# Patient Record
Sex: Female | Born: 1950 | Race: White | Hispanic: No | Marital: Married | Smoking: Never smoker
Health system: Southern US, Community
[De-identification: ages and names within clinical notes are randomized; demographics above are authoritative.]

## PROBLEM LIST (undated history)

## (undated) DIAGNOSIS — Z9481 Bone marrow transplant status: Secondary | ICD-10-CM

## (undated) DIAGNOSIS — I639 Cerebral infarction, unspecified: Secondary | ICD-10-CM

## (undated) DIAGNOSIS — S065X9A Traumatic subdural hemorrhage with loss of consciousness of unspecified duration, initial encounter: Secondary | ICD-10-CM

## (undated) DIAGNOSIS — F05 Delirium due to known physiological condition: Secondary | ICD-10-CM

## (undated) DIAGNOSIS — E669 Obesity, unspecified: Secondary | ICD-10-CM

## (undated) DIAGNOSIS — D496 Neoplasm of unspecified behavior of brain: Secondary | ICD-10-CM

## (undated) DIAGNOSIS — E039 Hypothyroidism, unspecified: Secondary | ICD-10-CM

## (undated) DIAGNOSIS — C50919 Malignant neoplasm of unspecified site of unspecified female breast: Secondary | ICD-10-CM

## (undated) DIAGNOSIS — S065XAA Traumatic subdural hemorrhage with loss of consciousness status unknown, initial encounter: Secondary | ICD-10-CM

## (undated) DIAGNOSIS — C801 Malignant (primary) neoplasm, unspecified: Secondary | ICD-10-CM

## (undated) DIAGNOSIS — R569 Unspecified convulsions: Secondary | ICD-10-CM

## (undated) DIAGNOSIS — F419 Anxiety disorder, unspecified: Secondary | ICD-10-CM

## (undated) DIAGNOSIS — F329 Major depressive disorder, single episode, unspecified: Secondary | ICD-10-CM

## (undated) DIAGNOSIS — I951 Orthostatic hypotension: Secondary | ICD-10-CM

## (undated) DIAGNOSIS — G903 Multi-system degeneration of the autonomic nervous system: Secondary | ICD-10-CM

## (undated) DIAGNOSIS — D649 Anemia, unspecified: Secondary | ICD-10-CM

## (undated) DIAGNOSIS — R32 Unspecified urinary incontinence: Secondary | ICD-10-CM

## (undated) DIAGNOSIS — C719 Malignant neoplasm of brain, unspecified: Secondary | ICD-10-CM

## (undated) DIAGNOSIS — R413 Other amnesia: Secondary | ICD-10-CM

## (undated) DIAGNOSIS — G540 Brachial plexus disorders: Secondary | ICD-10-CM

## (undated) DIAGNOSIS — F0390 Unspecified dementia without behavioral disturbance: Secondary | ICD-10-CM

## (undated) DIAGNOSIS — Z9884 Bariatric surgery status: Secondary | ICD-10-CM

## (undated) DIAGNOSIS — K589 Irritable bowel syndrome without diarrhea: Secondary | ICD-10-CM

## (undated) DIAGNOSIS — Z5189 Encounter for other specified aftercare: Secondary | ICD-10-CM

## (undated) DIAGNOSIS — I679 Cerebrovascular disease, unspecified: Secondary | ICD-10-CM

## (undated) HISTORY — DX: Orthostatic hypotension: I95.1

## (undated) HISTORY — PX: BREAST LUMPECTOMY: SHX2

## (undated) HISTORY — PX: CHOLECYSTECTOMY: SHX55

## (undated) HISTORY — DX: Malignant neoplasm of unspecified site of unspecified female breast: C50.919

## (undated) HISTORY — DX: Unspecified convulsions: R56.9

## (undated) HISTORY — DX: Other amnesia: R41.3

## (undated) HISTORY — DX: Major depressive disorder, single episode, unspecified: F32.9

## (undated) HISTORY — DX: Hypothyroidism, unspecified: E03.9

## (undated) HISTORY — DX: Bone marrow transplant status: Z94.81

## (undated) HISTORY — DX: Brachial plexus disorders: G54.0

## (undated) HISTORY — DX: Traumatic subdural hemorrhage with loss of consciousness of unspecified duration, initial encounter: S06.5X9A

## (undated) HISTORY — PX: CRANIOTOMY FOR HEMISPHERECTOMY TOTAL / PARTIAL: SUR335

## (undated) HISTORY — DX: Irritable bowel syndrome, unspecified: K58.9

## (undated) HISTORY — DX: Obesity, unspecified: E66.9

## (undated) HISTORY — DX: Anxiety disorder, unspecified: F41.9

## (undated) HISTORY — DX: Malignant neoplasm of brain, unspecified: C71.9

## (undated) HISTORY — PX: HAND TENDON SURGERY: SHX663

## (undated) HISTORY — DX: Traumatic subdural hemorrhage with loss of consciousness status unknown, initial encounter: S06.5XAA

## (undated) HISTORY — DX: Unspecified urinary incontinence: R32

## (undated) HISTORY — PX: LYMPH NODE BIOPSY: SHX201

---

## 1983-09-22 HISTORY — PX: GASTRIC BYPASS: SHX52

## 1989-09-21 DIAGNOSIS — C50919 Malignant neoplasm of unspecified site of unspecified female breast: Secondary | ICD-10-CM

## 1989-09-21 HISTORY — PX: BREAST LUMPECTOMY: SHX2

## 1989-09-21 HISTORY — PX: BONE MARROW TRANSPLANT: SHX200

## 1989-09-21 HISTORY — DX: Malignant neoplasm of unspecified site of unspecified female breast: C50.919

## 2003-09-22 DIAGNOSIS — I639 Cerebral infarction, unspecified: Secondary | ICD-10-CM

## 2003-09-22 HISTORY — DX: Cerebral infarction, unspecified: I63.9

## 2008-09-21 HISTORY — PX: CRANIOTOMY FOR HEMISPHERECTOMY TOTAL / PARTIAL: SUR335

## 2013-09-21 HISTORY — PX: HAND SURGERY: SHX662

## 2014-09-21 DIAGNOSIS — G459 Transient cerebral ischemic attack, unspecified: Secondary | ICD-10-CM

## 2014-09-21 HISTORY — DX: Transient cerebral ischemic attack, unspecified: G45.9

## 2015-08-31 ENCOUNTER — Encounter (HOSPITAL_COMMUNITY): Payer: Self-pay | Admitting: Emergency Medicine

## 2015-08-31 ENCOUNTER — Emergency Department (HOSPITAL_COMMUNITY): Payer: Medicare Other

## 2015-08-31 ENCOUNTER — Inpatient Hospital Stay (HOSPITAL_COMMUNITY): Payer: Medicare Other

## 2015-08-31 ENCOUNTER — Observation Stay (HOSPITAL_COMMUNITY)
Admission: EM | Admit: 2015-08-31 | Discharge: 2015-09-02 | Disposition: A | Discharge status: Home / Self Care | Payer: Medicare Other | Attending: Internal Medicine | Admitting: Internal Medicine

## 2015-08-31 DIAGNOSIS — R52 Pain, unspecified: Secondary | ICD-10-CM

## 2015-08-31 DIAGNOSIS — R569 Unspecified convulsions: Secondary | ICD-10-CM | POA: Diagnosis not present

## 2015-08-31 DIAGNOSIS — N3281 Overactive bladder: Secondary | ICD-10-CM | POA: Diagnosis present

## 2015-08-31 DIAGNOSIS — Z7902 Long term (current) use of antithrombotics/antiplatelets: Secondary | ICD-10-CM | POA: Insufficient documentation

## 2015-08-31 DIAGNOSIS — G458 Other transient cerebral ischemic attacks and related syndromes: Secondary | ICD-10-CM

## 2015-08-31 DIAGNOSIS — Z9989 Dependence on other enabling machines and devices: Secondary | ICD-10-CM

## 2015-08-31 DIAGNOSIS — M87051 Idiopathic aseptic necrosis of right femur: Secondary | ICD-10-CM | POA: Diagnosis not present

## 2015-08-31 DIAGNOSIS — M25452 Effusion, left hip: Secondary | ICD-10-CM | POA: Insufficient documentation

## 2015-08-31 DIAGNOSIS — I951 Orthostatic hypotension: Secondary | ICD-10-CM | POA: Diagnosis not present

## 2015-08-31 DIAGNOSIS — F329 Major depressive disorder, single episode, unspecified: Secondary | ICD-10-CM | POA: Insufficient documentation

## 2015-08-31 DIAGNOSIS — G4733 Obstructive sleep apnea (adult) (pediatric): Secondary | ICD-10-CM | POA: Diagnosis not present

## 2015-08-31 DIAGNOSIS — G629 Polyneuropathy, unspecified: Secondary | ICD-10-CM

## 2015-08-31 DIAGNOSIS — M160 Bilateral primary osteoarthritis of hip: Secondary | ICD-10-CM | POA: Insufficient documentation

## 2015-08-31 DIAGNOSIS — M25451 Effusion, right hip: Secondary | ICD-10-CM | POA: Insufficient documentation

## 2015-08-31 DIAGNOSIS — G459 Transient cerebral ischemic attack, unspecified: Principal | ICD-10-CM | POA: Diagnosis present

## 2015-08-31 DIAGNOSIS — D649 Anemia, unspecified: Secondary | ICD-10-CM | POA: Diagnosis not present

## 2015-08-31 DIAGNOSIS — I6930 Unspecified sequelae of cerebral infarction: Secondary | ICD-10-CM

## 2015-08-31 DIAGNOSIS — R0989 Other specified symptoms and signs involving the circulatory and respiratory systems: Secondary | ICD-10-CM | POA: Diagnosis not present

## 2015-08-31 DIAGNOSIS — E039 Hypothyroidism, unspecified: Secondary | ICD-10-CM | POA: Diagnosis present

## 2015-08-31 DIAGNOSIS — E785 Hyperlipidemia, unspecified: Secondary | ICD-10-CM | POA: Diagnosis not present

## 2015-08-31 DIAGNOSIS — Z853 Personal history of malignant neoplasm of breast: Secondary | ICD-10-CM | POA: Diagnosis not present

## 2015-08-31 DIAGNOSIS — I739 Peripheral vascular disease, unspecified: Secondary | ICD-10-CM | POA: Insufficient documentation

## 2015-08-31 DIAGNOSIS — C7931 Secondary malignant neoplasm of brain: Secondary | ICD-10-CM | POA: Diagnosis not present

## 2015-08-31 DIAGNOSIS — M87851 Other osteonecrosis, right femur: Secondary | ICD-10-CM | POA: Insufficient documentation

## 2015-08-31 DIAGNOSIS — I253 Aneurysm of heart: Secondary | ICD-10-CM | POA: Diagnosis not present

## 2015-08-31 DIAGNOSIS — I4581 Long QT syndrome: Secondary | ICD-10-CM | POA: Insufficient documentation

## 2015-08-31 DIAGNOSIS — Z79899 Other long term (current) drug therapy: Secondary | ICD-10-CM | POA: Diagnosis not present

## 2015-08-31 DIAGNOSIS — I639 Cerebral infarction, unspecified: Secondary | ICD-10-CM

## 2015-08-31 DIAGNOSIS — F0390 Unspecified dementia without behavioral disturbance: Secondary | ICD-10-CM | POA: Diagnosis present

## 2015-08-31 DIAGNOSIS — R9431 Abnormal electrocardiogram [ECG] [EKG]: Secondary | ICD-10-CM | POA: Diagnosis present

## 2015-08-31 HISTORY — DX: Neoplasm of unspecified behavior of brain: D49.6

## 2015-08-31 HISTORY — DX: Malignant (primary) neoplasm, unspecified: C80.1

## 2015-08-31 HISTORY — DX: Unspecified convulsions: R56.9

## 2015-08-31 HISTORY — DX: Encounter for other specified aftercare: Z51.89

## 2015-08-31 HISTORY — DX: Anemia, unspecified: D64.9

## 2015-08-31 HISTORY — DX: Cerebral infarction, unspecified: I63.9

## 2015-08-31 LAB — DIFFERENTIAL
BASOS ABS: 0.1 10*3/uL (ref 0.0–0.1)
Basophils Relative: 1 %
EOS ABS: 0.2 10*3/uL (ref 0.0–0.7)
Eosinophils Relative: 3 %
LYMPHS ABS: 1.6 10*3/uL (ref 0.7–4.0)
LYMPHS PCT: 28 %
MONOS PCT: 9 %
Monocytes Absolute: 0.5 10*3/uL (ref 0.1–1.0)
NEUTROS PCT: 59 %
Neutro Abs: 3.3 10*3/uL (ref 1.7–7.7)

## 2015-08-31 LAB — COMPREHENSIVE METABOLIC PANEL
ALBUMIN: 3.5 g/dL (ref 3.5–5.0)
ALK PHOS: 92 U/L (ref 38–126)
ALT: 17 U/L (ref 14–54)
AST: 25 U/L (ref 15–41)
Anion gap: 9 (ref 5–15)
BILIRUBIN TOTAL: 0.7 mg/dL (ref 0.3–1.2)
BUN: 17 mg/dL (ref 6–20)
CALCIUM: 9 mg/dL (ref 8.9–10.3)
CO2: 21 mmol/L — ABNORMAL LOW (ref 22–32)
CREATININE: 1.06 mg/dL — AB (ref 0.44–1.00)
Chloride: 109 mmol/L (ref 101–111)
GFR calc Af Amer: >60 mL/min (ref >60)
GFR, EST NON AFRICAN AMERICAN: 54 mL/min — AB (ref >60)
Glucose, Bld: 111 mg/dL — ABNORMAL HIGH (ref 65–99)
Potassium: 3.5 mmol/L (ref 3.5–5.1)
Sodium: 139 mmol/L (ref 135–145)
TOTAL PROTEIN: 6.5 g/dL (ref 6.5–8.1)

## 2015-08-31 LAB — I-STAT CHEM 8, ED
BUN: 20 mg/dL (ref 6–20)
CREATININE: 1 mg/dL (ref 0.44–1.00)
Calcium, Ion: 1.18 mmol/L (ref 1.13–1.30)
Chloride: 105 mmol/L (ref 101–111)
GLUCOSE: 105 mg/dL — AB (ref 65–99)
HCT: 41 % (ref 36.0–46.0)
HEMOGLOBIN: 13.9 g/dL (ref 12.0–15.0)
POTASSIUM: 3.5 mmol/L (ref 3.5–5.1)
Sodium: 142 mmol/L (ref 135–145)
TCO2: 24 mmol/L (ref 0–100)

## 2015-08-31 LAB — APTT: APTT: 26 s (ref 24–37)

## 2015-08-31 LAB — TSH: TSH: 1.178 u[IU]/mL (ref 0.350–4.500)

## 2015-08-31 LAB — CBC
HEMATOCRIT: 38.3 % (ref 36.0–46.0)
HEMOGLOBIN: 12.4 g/dL (ref 12.0–15.0)
MCH: 29.3 pg (ref 26.0–34.0)
MCHC: 32.4 g/dL (ref 30.0–36.0)
MCV: 90.5 fL (ref 78.0–100.0)
Platelets: 214 10*3/uL (ref 150–400)
RBC: 4.23 MIL/uL (ref 3.87–5.11)
RDW: 14.3 % (ref 11.5–15.5)
WBC: 5.6 10*3/uL (ref 4.0–10.5)

## 2015-08-31 LAB — PROTIME-INR
INR: 1.13 (ref 0.00–1.49)
Prothrombin Time: 14.7 seconds (ref 11.6–15.2)

## 2015-08-31 LAB — RAPID URINE DRUG SCREEN, HOSP PERFORMED
Amphetamines: NOT DETECTED
BARBITURATES: NOT DETECTED
Benzodiazepines: NOT DETECTED
COCAINE: NOT DETECTED
Opiates: NOT DETECTED
TETRAHYDROCANNABINOL: NOT DETECTED

## 2015-08-31 LAB — GLUCOSE, CAPILLARY: GLUCOSE-CAPILLARY: 75 mg/dL (ref 65–99)

## 2015-08-31 LAB — ETHANOL: Alcohol, Ethyl (B): <5 mg/dL (ref <5)

## 2015-08-31 LAB — CBG MONITORING, ED: GLUCOSE-CAPILLARY: 96 mg/dL (ref 65–99)

## 2015-08-31 LAB — I-STAT TROPONIN, ED: TROPONIN I, POC: 0 ng/mL (ref 0.00–0.08)

## 2015-08-31 MED ORDER — IOHEXOL 350 MG/ML SOLN
50.0000 mL | Freq: Once | INTRAVENOUS | Status: AC | PRN
  Administered 2015-08-31: 50 mL via INTRAVENOUS

## 2015-08-31 MED ORDER — VITAMIN B-12 1000 MCG PO TABS
1000.0000 ug | ORAL_TABLET | Freq: Every day | ORAL | Status: DC
  Administered 2015-09-01 – 2015-09-02 (×2): 1000 ug via ORAL
  Filled 2015-08-31 (×2): qty 1

## 2015-08-31 MED ORDER — MIDODRINE HCL 5 MG PO TABS: 10.0000 mg | ORAL_TABLET | Freq: Two times a day (BID) | ORAL | Status: DC

## 2015-08-31 MED ORDER — MIRABEGRON ER 25 MG PO TB24
50.0000 mg | ORAL_TABLET | Freq: Every day | ORAL | Status: DC
  Administered 2015-09-01 – 2015-09-02 (×2): 50 mg via ORAL
  Filled 2015-08-31 (×2): qty 2

## 2015-08-31 MED ORDER — STROKE: EARLY STAGES OF RECOVERY BOOK
Freq: Once | Status: AC
  Administered 2015-08-31: 23:00:00
  Filled 2015-08-31: qty 1

## 2015-08-31 MED ORDER — SODIUM CHLORIDE 0.9 % IV SOLN
INTRAVENOUS | Status: DC
Start: 2015-08-31 — End: 2015-08-31

## 2015-08-31 MED ORDER — KETOROLAC TROMETHAMINE 15 MG/ML IJ SOLN: 15.0000 mg | Freq: Four times a day (QID) | INTRAMUSCULAR | Status: DC | PRN

## 2015-08-31 MED ORDER — ESCITALOPRAM OXALATE 10 MG PO TABS
20.0000 mg | ORAL_TABLET | Freq: Every day | ORAL | Status: DC
  Administered 2015-09-01 – 2015-09-02 (×2): 20 mg via ORAL
  Filled 2015-08-31 (×2): qty 2

## 2015-08-31 MED ORDER — DEXTROSE-NACL 5-0.9 % IV SOLN
INTRAVENOUS | Status: DC
  Administered 2015-08-31 – 2015-09-01 (×2): via INTRAVENOUS

## 2015-08-31 MED ORDER — PANTOPRAZOLE SODIUM 40 MG PO TBEC: 40.0000 mg | DELAYED_RELEASE_TABLET | Freq: Every day | ORAL | Status: DC

## 2015-08-31 MED ORDER — LIDOCAINE 5 % EX PTCH
1.0000 | MEDICATED_PATCH | CUTANEOUS | Status: DC
  Administered 2015-08-31 – 2015-09-02 (×3): 1 via TRANSDERMAL
  Filled 2015-08-31 (×3): qty 1

## 2015-08-31 MED ORDER — ADULT MULTIVITAMIN W/MINERALS CH
1.0000 | ORAL_TABLET | Freq: Every day | ORAL | Status: DC
  Administered 2015-09-01 – 2015-09-02 (×2): 1 via ORAL
  Filled 2015-08-31 (×3): qty 1

## 2015-08-31 MED ORDER — DONEPEZIL HCL 10 MG PO TABS
10.0000 mg | ORAL_TABLET | Freq: Every day | ORAL | Status: DC
  Administered 2015-09-01: 10 mg via ORAL
  Filled 2015-08-31: qty 1

## 2015-08-31 MED ORDER — ENOXAPARIN SODIUM 40 MG/0.4ML ~~LOC~~ SOLN
40.0000 mg | SUBCUTANEOUS | Status: DC
  Administered 2015-08-31 – 2015-09-02 (×3): 40 mg via SUBCUTANEOUS
  Filled 2015-08-31 (×3): qty 0.4

## 2015-08-31 MED ORDER — POLYSACCHARIDE IRON COMPLEX 150 MG PO CAPS
150.0000 mg | ORAL_CAPSULE | Freq: Every day | ORAL | Status: DC
  Administered 2015-09-01 – 2015-09-02 (×2): 150 mg via ORAL
  Filled 2015-08-31 (×2): qty 1

## 2015-08-31 MED ORDER — ACETAMINOPHEN 325 MG PO TABS
650.0000 mg | ORAL_TABLET | Freq: Four times a day (QID) | ORAL | Status: DC | PRN
  Filled 2015-08-31: qty 2

## 2015-08-31 MED ORDER — NAPROXEN SODIUM 550 MG PO TABS: 550.0000 mg | ORAL_TABLET | Freq: Two times a day (BID) | ORAL | Status: DC

## 2015-08-31 MED ORDER — PREGABALIN 75 MG PO CAPS: 75.0000 mg | ORAL_CAPSULE | Freq: Every day | ORAL | Status: DC

## 2015-08-31 MED ORDER — SENNOSIDES-DOCUSATE SODIUM 8.6-50 MG PO TABS
1.0000 | ORAL_TABLET | Freq: Every evening | ORAL | Status: DC | PRN
  Administered 2015-09-02: 1 via ORAL
  Filled 2015-08-31: qty 1

## 2015-08-31 MED ORDER — MEMANTINE HCL 10 MG PO TABS
10.0000 mg | ORAL_TABLET | Freq: Every day | ORAL | Status: DC
  Administered 2015-09-01 – 2015-09-02 (×2): 10 mg via ORAL
  Filled 2015-08-31 (×3): qty 1

## 2015-08-31 MED ORDER — LEVOTHYROXINE SODIUM 50 MCG PO TABS
75.0000 ug | ORAL_TABLET | Freq: Every day | ORAL | Status: DC
  Administered 2015-09-01 – 2015-09-02 (×2): 75 ug via ORAL
  Filled 2015-08-31 (×2): qty 1

## 2015-08-31 MED ORDER — IRON 325 (65 FE) MG PO TABS: 1.0000 | ORAL_TABLET | Freq: Every day | ORAL | Status: DC

## 2015-08-31 MED ORDER — NAPROXEN 250 MG PO TABS: 250.0000 mg | ORAL_TABLET | Freq: Once | ORAL | Status: DC

## 2015-08-31 MED ORDER — ASPIRIN 300 MG RE SUPP
600.0000 mg | Freq: Every day | RECTAL | Status: DC
  Administered 2015-08-31: 300 mg via RECTAL
  Administered 2015-09-01: 600 mg via RECTAL
  Filled 2015-08-31 (×3): qty 2

## 2015-08-31 MED ORDER — OXYBUTYNIN CHLORIDE 5 MG PO TABS: 10.0000 mg | ORAL_TABLET | Freq: Two times a day (BID) | ORAL | Status: DC

## 2015-08-31 MED ORDER — CLOPIDOGREL BISULFATE 75 MG PO TABS: 75.0000 mg | ORAL_TABLET | Freq: Every day | ORAL | Status: DC

## 2015-08-31 NOTE — Consult Note (Signed)
Neurology Consultation Reason for Consult: Left-sided weakness Referring Physician: Jeanell Sparrow, D  CC: Left-sided weakness  History is obtained from: Family, patient  HPI: Barbara Flowers is a 64 y.o. 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 She was in her normal state of health this morning until around 10:30 AM at which point she had sudden onset left arm and leg weakness. She slumped over to the left side and her husband asked why she didn't just use her left arm to push her self backup. The patient was unaware that she was weak on the left side and the husband realized that she was completely flaccid on that side and therefore 911 was called.  On EMS arrival, she had some strength returning to her left arm but was still completely flaccid in her left leg in a code stroke was activated. En route to the hospital, she had rapid improvement in her symptoms and by the arrival of the ambulance, she was essentially back to baseline.  There is no evidence of seizure or loss of consciousness during episode.  LKW: 10:30 AM  tpa given?: no, symptoms resolved   ROS: A 14 point ROS was performed and is negative except as noted in the HPI.   Past Medical History  Diagnosis Date  . Anemia   . Seizures ([place])   . Cancer ([place]) 1990    L breast  . Brain tumor ([place])   . Stroke [facility]) 2005    hemorrhagic  . Blood transfusion without reported diagnosis      Family history: Father-stroke in his 12s   Social History:  reports that she has never smoked. She does not have any smokeless tobacco history on file. She reports that she drinks alcohol. Her drug history is not on file.   Exam: Current vital signs: BP 117/66 mmHg  Pulse 69  Temp(Src) 97.4 F (36.3  C)  Resp 19  SpO2 100% Vital signs in last 24 hours: Temp:  [97.4 F (36.3 C)] 97.4 F (36.3 C) (12/10 1201) Pulse Rate:  [69] 69 (12/10 1201) Resp:  [19] 19 (12/10 1201) BP: (117)/(66) 117/66 mmHg (12/10 1201) SpO2:  [100 %] 100 % (12/10 1201)  Physical Exam  Constitutional: Appears chronically ill  Psych: Affect appropriate to situation Eyes: No scleral injection HENT: No OP obstrucion Head: Has some hair loss Cardiovascular: Normal rate and regular rhythm.  Respiratory: Effort normal GI: Soft.  No distension. There is no tenderness.  Skin: WDI  Neuro: Mental Status: Patient is awake, alert, oriented to person, place, month, year, and situation. Patient is able to give a clear and coherent history. No signs of aphasia or neglect Cranial Nerves: II: Visual Fields are full. Pupils are equal, round, and reactive to light.   III,IV, VI: EOMI without ptosis or diploplia.  V: Facial sensation is symmetric to temperature VII: Facial movement has some mild nasolabial fold flattening on the left VIII: hearing is intact to voice X: Uvula elevates symmetrically XI: Shoulder shrug is symmetric. XII: tongue is midline without atrophy or fasciculations.  Motor: Tone is normal. Bulk is normal. 5/5 strength was present on the right side, as well as left leg. She has 4 minus/5 strength in the left arm with contractures in the left wrist Sensory: Sensation is symmetric to light  touch and temperature in the arms and legs. Cerebellar: No clear ataxia in the upper extremities  I have reviewed labs in epic and the results pertinent to this consultation are: Chem 8-unremarkable  I have reviewed the images obtained: CT head-multiple chronic appearing findings, but no clear acute findings.  Impression: 64 year old female with previous ischemic strokes as well as multiple old surgeries for tumors/hemorrhage who presents with sudden onset left flaccid hemiparalysis with neglect. There is no  evidence of seizure activity, and given her history I suspect that this was a right MCA TIA. I would favor admission for further evaluation for possible secondary risk factor modification.  Recommendations: 1. HgbA1c, fasting lipid panel 2. MRI of the brain without contrast 3. Frequent neuro checks 4. Echocardiogram 5. CT angiogram of the head and neck, I have ordered this. MRA head and carotid Dopplers are not needed 6. Prophylactic therapy-Antiplatelet med: Plavix 75 mg daily 7. Risk factor modification 8. Telemetry monitoring    Roland Rack, MD [facility] [phone]  If 7pm- 7am, please page neurology on call as listed in Early.

## 2015-08-31 NOTE — Progress Notes (Signed)
Patient admitted to 5C20. Patient is alert and oriented x3, disoriented to specific place. Patient looks pale, RN paged MD, CBG 74. Patient is NPO due to failed RN stroke swallow screen, order for slp bedside swallow placed, toothette swabs given to patient. Respiratory contacted for patient's CPAP. Patient placed on one liter [place] for comfort. NIHHS 3. Patient and family updated on plan of care, patient and family vocalized understanding of fall policy, bed alarm on, will continue to monitor closely.

## 2015-08-31 NOTE — ED Provider Notes (Signed)
CSN: [id number]     Arrival date & time 08/31/15  1131 History   First MD Initiated Contact with Patient 08/31/15 1135     No chief complaint on file.    (Consider location/radiation/quality/duration/timing/severity/associated sxs/prior Treatment) HPI 64 y.o. Female history of hemmorhagic stroke with residual left sided weakness presents today via ems with report that she had a sudden onset of left sided weakness to day at 1035.  Symptoms were severe per husband but gradually resolved over 15-10 minutes until almost at baseline.  No headache or difficulty speaking. Patient had a stroke several years ago according to her husband.  They have moved here from [place] two weeks ago and established care with her new primary doctor in [place] yesterday.     No past medical history on file. No past surgical history on file. No family history on file. Social History  Substance Use Topics  . Smoking status: Not on file  . Smokeless tobacco: Not on file  . Alcohol Use: Not on file   OB History    No data available     Review of Systems  All other systems reviewed and are negative.     Allergies  Review of patient's allergies indicates not on file.  Home Medications   Prior to Admission medications   Not on File   There were no vitals taken for this visit. Physical Exam  Constitutional: She is oriented to person, place, and time. She appears well-developed and well-nourished.  HENT:  Head: Normocephalic and atraumatic.  Right Ear: Tympanic membrane and external ear normal.  Left Ear: Tympanic membrane and external ear normal.  Nose: Nose normal. Right sinus exhibits no maxillary sinus tenderness and no frontal sinus tenderness. Left sinus exhibits no maxillary sinus tenderness and no frontal sinus tenderness.  Eyes: Conjunctivae and EOM are normal. Pupils are equal, round, and reactive to light. Right eye exhibits no nystagmus. Left eye exhibits no nystagmus.  Neck: Normal range  of motion. Neck supple.  Cardiovascular: Normal rate, regular rhythm, normal heart sounds and intact distal pulses.   Pulmonary/Chest: Effort normal and breath sounds normal. No respiratory distress. She exhibits no tenderness.  Abdominal: Soft. Bowel sounds are normal. She exhibits no distension and no mass. There is no tenderness.  Musculoskeletal: Normal range of motion. She exhibits no edema or tenderness.  Neurological: She is alert and oriented to person, place, and time. She has normal strength and normal reflexes. No sensory deficit. She displays a negative Romberg sign. GCS eye subscore is 4. GCS verbal subscore is 5. GCS motor subscore is 6.  Reflex Scores:      Tricep reflexes are 2+ on the right side and 2+ on the left side.      Bicep reflexes are 2+ on the right side and 2+ on the left side.      Brachioradialis reflexes are 2+ on the right side and 2+ on the left side.      Patellar reflexes are 2+ on the right side and 2+ on the left side.      Achilles reflexes are 2+ on the right side and 2+ on the left side. Speech is normal without dysarthria, dysphasia, or aphasia. Muscle strength is 4/5 in left shoulder with some drift  Left lower extremity appears slightly weaker than right.     Skin: Skin is warm and dry. No rash noted.  Psychiatric: She has a normal mood and affect. Her behavior is normal. Judgment and thought content normal.  Nursing note and vitals reviewed.   ED Course  Procedures (including critical care time) Labs Review Labs Reviewed  ETHANOL  PROTIME-INR  APTT  CBC  DIFFERENTIAL  COMPREHENSIVE METABOLIC PANEL  URINE RAPID DRUG SCREEN, HOSP PERFORMED  URINALYSIS, ROUTINE W REFLEX MICROSCOPIC (NOT AT [facility])  I-STAT CHEM 8, ED  I-STAT TROPOININ, ED    Imaging Review Ct Head Wo Contrast  08/31/2015  CLINICAL DATA:  Left side weakness starting this morning 11 a.m., history of previous hemorrhage EXAM: CT HEAD WITHOUT CONTRAST TECHNIQUE: Contiguous  axial images were obtained from the base of the skull through the vertex without intravenous contrast. COMPARISON:  None. FINDINGS: No skull fracture is noted. The patient is status post right parietal craniotomy. There is focal area of decreased attenuation in right posterior parietal lobe most likely postsurgical in nature or sequela from previous hemorrhage. Mild cerebral atrophy. Periventricular and patchy subcortical white matter decreased attenuation probable due to chronic small vessel ischemic changes. No definite acute cortical infarction. There is a lacunar infarct in left thalamus. No mass lesion is noted on this unenhanced scan. IMPRESSION: The patient is status post right parietal craniotomy. There is focal area of decreased attenuation in right posterior parietal lobe most likely postsurgical in nature or sequela from previous hemorrhage. Mild cerebral atrophy. Periventricular and patchy subcortical white matter decreased attenuation probable due to chronic small vessel ischemic changes. No definite acute cortical infarction. There is a lacunar infarct in left thalamus. These results were called by telephone at the time of interpretation on 08/31/2015 at 11:55 am to Dr. Leonel Ramsay, who verbally acknowledged these results. Electronically Signed   By: Lahoma Crocker M.D.   On: 08/31/2015 11:56   I have personally reviewed and evaluated these images and lab results as part of my medical decision-making.   EKG Interpretation   Date/Time:  Saturday August 31 2015 11:49:30 EST Ventricular Rate:  74 PR Interval:  158 QRS Duration: 91 QT Interval:  473 QTC Calculation: 525 R Axis:   28 Text Interpretation:  Normal sinus rhythm Poor R wave progression No old  tracing to compare Confirmed by Sierrah Luevano MD, Andee Poles [phone]) on 08/31/2015  12:06:21 PM      MDM   Final diagnoses:  Stroke (cerebrum) ([place])  Stroke (cerebrum) ([place])  TIA (transient ischemic attack)    Patient made code stroke prior  to my evaluation and seen by Dr. Leonel Ramsay immediately after my evaluation.  No tpa at this time as symptoms have greatly improved. Dr. Leonel Ramsay advises that patient has had multiple strokes before and should be admitted for tia evaluation. Will call unassigned.    Pattricia Boss, MD 09/01/15 [phone]

## 2015-08-31 NOTE — ED Notes (Signed)
Admitting MD at bedside.

## 2015-08-31 NOTE — Progress Notes (Signed)
Utilization Review Completed after review with Secondary reviewer. Changed to OBS status.

## 2015-08-31 NOTE — ED Notes (Signed)
LNW 1035 Sudden onset L arm and L leg flaccid, garbled speech

## 2015-08-31 NOTE — H&P (Signed)
[facility] History and Physical                                                                                    Barbara Flowers, is a 64 y.o. female  MRN: [id number]   DOB - Jul 24, 1951  Admit Date - 08/31/2015  Outpatient Primary MD for the patient is HAGUE, Rosalyn Charters, MD  Referring MD: Jeanell Sparrow / ER  Consulting M.D: Leonel Ramsay / Neuro  With History of -  Past Medical History  Diagnosis Date  . Anemia   . Seizures ([place])   . Cancer ([place]) 1990    L breast  . Brain tumor ([place])   . Stroke [facility]) 2005    hemorrhagic  . Blood transfusion without reported diagnosis       Past Surgical History  Procedure Laterality Date  . Lymph node biopsy    . Hand surgery Left 2015  . Gastric bypass  1985  . Cholecystectomy    . Craniotomy for hemispherectomy total / partial Right   . Breast lumpectomy Left     post chemotherapy and radiation  . Hand tendon surgery Left     for brachial plexus injury    in for   Chief Complaint  Patient presents with  . 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 Patient has recently moved to [place] from [place] and has just established with a primary care physician in [place] yesterday. Presented to the ER due to abrupt onset of inability to move the left side with no other associated neurological symptoms such as aphasia, tingling, facial  drooping. Family at bedside assisting with history reports symptomatology lasted only about 10-15 minutes then began to slowly resolved. Patient was significantly disoriented during the episode. Because of the symptoms she was brought to the emergency department for further evaluation.  In the ER the patient was afebrile, hemodynamically stable with a BP of 10/03/1963, pulse is regular at 68 she was maintaining sinus rhythm, respirations 17 and room air saturations 90%. CT of the head without contrast revealed no acute abnormalities. Because of presentation a code stroke was initially called when patient arrived. She was evaluated by neurology who canceled code stroke and felt the patient was having TIA symptoms and recommended obtaining a CT angiogram of the head and neck. Laboratory data unremarkable except for mild hyperglycemia glucose 105. Troponin was normal. Coags were normal. Alcohol level was less than 5. EKG revealed sinus rhythm with prolonged QTC 525 ms but no acute ischemic changes.   Review of Systems   In addition to the HPI above,  No Fever-chills, myalgias or other constitutional  symptoms No Headache, changes with Vision or hearing, new weakness, tingling, numbness in any extremity only the weakness as described above No problems swallowing food or Liquids, indigestion/reflux No Chest pain, Cough or Shortness of Breath, palpitations, orthopnea or DOE No Abdominal pain, N/V; no melena or hematochezia, no dark tarry stools, Bowel movements are regular, No dysuria, hematuria or flank pain No new skin rashes, lesions, masses or bruises, No new joints pains-aches No recent weight gain or loss No polyuria, polydypsia or polyphagia,  *A full 10 point Review of Systems was done, except as stated above, all other Review of Systems were negative.  Social History Social History  Substance Use Topics  . Smoking status: Never Smoker   . Smokeless tobacco: Not on file  . Alcohol Use: Yes       Comment: socially    Resides at: Private residence  Lives with: Husband  Ambulatory status: With assistive devices   Family History Reviewed and is currently noncontributory to current TIA rule out diagnosis.   Prior to Admission medications   Medication Sig Start Date End Date Taking? Authorizing Provider  clopidogrel (PLAVIX) 75 MG tablet Take 75 mg by mouth daily.   Yes Historical Provider, MD  donepezil (ARICEPT) 10 MG tablet Take 10 mg by mouth at bedtime.   Yes Historical Provider, MD  escitalopram (LEXAPRO) 20 MG tablet Take 20 mg by mouth daily.   Yes Historical Provider, MD  Ferrous Sulfate (IRON) 325 (65 FE) MG TABS Take 1 tablet by mouth daily.   Yes Historical Provider, MD  levothyroxine (SYNTHROID, LEVOTHROID) 75 MCG tablet Take 75 mcg by mouth daily before breakfast.   Yes Historical Provider, MD  memantine (NAMENDA) 10 MG tablet Take 10 mg by mouth daily.   Yes Historical Provider, MD  midodrine (PROAMATINE) 10 MG tablet Take 10 mg by mouth 2 (two) times daily.   Yes Historical Provider, MD  mirabegron ER (MYRBETRIQ) 50 MG TB24 tablet Take 50 mg by mouth daily.   Yes Historical Provider, MD  Multiple Vitamins-Minerals (MULTIVITAMIN WITH MINERALS) tablet Take 1 tablet by mouth daily.   Yes Historical Provider, MD  naproxen sodium (ALEVE) 220 MG tablet Take 440 mg by mouth 2 (two) times daily as needed.   Yes Historical Provider, MD  nortriptyline (PAMELOR) 25 MG capsule Take 25 mg by mouth 2 (two) times daily.   Yes Historical Provider, MD  oxybutynin (DITROPAN) 5 MG tablet Take 10 mg by mouth 2 (two) times daily.   Yes Historical Provider, MD  pantoprazole (PROTONIX) 40 MG tablet Take 40 mg by mouth daily.   Yes Historical Provider, MD  pregabalin (LYRICA) 75 MG capsule Take 75 mg by mouth at bedtime.   Yes Historical Provider, MD  vitamin B-12 (CYANOCOBALAMIN) 1000 MCG tablet Take 1,000 mcg by mouth daily.   Yes Historical Provider, MD    Allergies  Allergen  Reactions  . Tramadol     Brings on Seizures     Physical Exam  Vitals  Blood pressure 127/67, pulse 64, temperature 97.4 F (36.3 C), resp. rate 20, SpO2 99 %.   General:  In no acute distress, appears chronically ill and older than stated age  Psych:  Normal affect, Denies Suicidal or Homicidal ideations, Awake Alert, Oriented X 3. Speech and thought patterns are clear and appropriate  Neuro: CN II through XII intact, Strength 5/5 right side although testing to right lower extremity limited by right hip pain, note left lower extremity atrophy with decreased strength  4/5 and inability to perform left heel shin maneuver, upper extremity motor strength difficult to assess given chronic pronation of left hand with contracture of distal fingers -proximal muscle strength tested and was noted to be 4/5; Sensation intact all 4 extremities.  ENT:  Ears and Eyes appear Normal, Conjunctivae clear, PER. Moist oral mucosa without erythema or exudates.  Neck:  Supple, No lymphadenopathy appreciated  Respiratory:  Symmetrical chest wall movement, Good air movement bilaterally, CTAB. Room Air  Cardiac:  RRR, No Murmurs, no LE edema noted, no JVD, loud left carotid bruit, peripheral pulses palpable at 2+  Abdomen:  Positive bowel sounds, Soft, Non tender, Non distended,  No masses appreciated, no obvious hepatosplenomegaly  Skin:  No Cyanosis, Normal Skin Turgor, No Skin Rash or Bruise.  Extremities: Symmetrical without obvious trauma or injury,  no effusions. Left lower sternum with atrophy as noted above  Data Review  CBC  Recent Labs Lab 08/31/15 1135 08/31/15 1142  WBC 5.6  --   HGB 12.4 13.9  HCT 38.3 41.0  PLT 214  --   MCV 90.5  --   MCH 29.3  --   MCHC 32.4  --   RDW 14.3  --   LYMPHSABS 1.6  --   MONOABS 0.5  --   EOSABS 0.2  --   BASOSABS 0.1  --     Chemistries   Recent Labs Lab 08/31/15 1135 08/31/15 1142  NA 139 142  K 3.5 3.5  CL 109 105  CO2 21*  --     GLUCOSE 111* 105*  BUN 17 20  CREATININE 1.06* 1.00  CALCIUM 9.0  --   AST 25  --   ALT 17  --   ALKPHOS 92  --   BILITOT 0.7  --     CrCl cannot be calculated (Unknown ideal weight.).  No results for input(s): TSH, T4TOTAL, T3FREE, THYROIDAB in the last 72 hours.  Invalid input(s): FREET3  Coagulation profile  Recent Labs Lab 08/31/15 1135  INR 1.13    No results for input(s): DDIMER in the last 72 hours.  Cardiac Enzymes No results for input(s): CKMB, TROPONINI, MYOGLOBIN in the last 168 hours.  Invalid input(s): CK  Invalid input(s): POCBNP  Urinalysis No results found for: COLORURINE, APPEARANCEUR, LABSPEC, PHURINE, GLUCOSEU, HGBUR, BILIRUBINUR, KETONESUR, PROTEINUR, UROBILINOGEN, NITRITE, LEUKOCYTESUR  Imaging results:   Ct Head Wo Contrast  08/31/2015  CLINICAL DATA:  Left side weakness starting this morning 11 a.m., history of previous hemorrhage EXAM: CT HEAD WITHOUT CONTRAST TECHNIQUE: Contiguous axial images were obtained from the base of the skull through the vertex without intravenous contrast. COMPARISON:  None. FINDINGS: No skull fracture is noted. The patient is status post right parietal craniotomy. There is focal area of decreased attenuation in right posterior parietal lobe most likely postsurgical in nature or sequela from previous hemorrhage. Mild cerebral atrophy. Periventricular and patchy subcortical white matter decreased attenuation probable due to chronic small vessel ischemic changes. No definite acute cortical infarction. There is a lacunar infarct in left thalamus. No mass lesion is noted on this unenhanced scan. IMPRESSION: The patient is status post right parietal craniotomy. There is focal area of decreased attenuation in right posterior parietal lobe most likely postsurgical in nature or sequela from previous hemorrhage. Mild cerebral atrophy. Periventricular and patchy subcortical white matter decreased attenuation probable due to chronic  small vessel ischemic changes. No definite acute cortical infarction. There is a lacunar infarct in left thalamus. These results were called  by telephone at the time of interpretation on 08/31/2015 at 11:55 am to Dr. Leonel Ramsay, who verbally acknowledged these results. Electronically Signed   By: Lahoma Crocker M.D.   On: 08/31/2015 11:56     EKG: (Independently reviewed) sinus rhythm with ventricular rate 74 bpm, prolonged QTC at 525 ms, low voltage R waves, no acute ischemic changes.   Assessment & Plan  Principal Problem:   TIA / History of CVA with residual deficit left side -Telemetry -Neurology following -CT head and neck with angiogram recommended by neurology -MRI without contrast of brain as recommended by neurology -Echocardiogram-uncertain if newly diagnosed LV aneurysm contributing to symptomatology (see below) -Hold preadmission Plavix since failed swallow eval so will use ASA 600 mg supp daily -Hemoglobin A1c and lipid panel -PT/OT/SLP evaluations -failed RN swallow eval so will keep NPO and change meds as appropriate to IV route  Active Problems:   Chronic orthostatic hypotension -Continue preadmission midodrine    QT prolongation -Repeat EKG in a.m. -Reviewed medications-Aricept can cause AV block and bradycardia, Pamelor can cause QT prolongation so we'll hold Pamelor for now    OSA on CPAP -Continue nocturnal CPAP    OAB (overactive bladder) -Continue preadmission medications    Left ventricular aneurysm -New diagnosis chest prior to moving from [place] -Was only started on Plavix per daughter's report secondary to history of at that time the unexplained anemia; apparently since that time the patient is undergone full GI workup with no clear definitive source of anemia found -Follow up on echo this admission    Left carotid bruit -Daughter reports recent carotid Dopplers negative -Follow up on CT angiogram of the neck    Hypothyroidism -Continue  Synthroid -Check TSH    Dementia -Continue preadmission medications    Peripheral neuropathy ([place]) -Continue preadmission medications -Physical therapy as above    Avascular necrosis of right femoral head ([facility]) -Continue preadmission NSAIDs -Add Lidoderm patch    Anemia -Baseline hemoglobin unknown recurrent hemoglobin 12.4    DVT Prophylaxis: Lovenox  Family Communication:  Husband and daughter as well as other family members at the bedside   Code Status:  Full code  Condition:  Stable  Discharge disposition: When medically stable anticipate discharge back to home environment  Time spent in minutes : 60      Meagan Spease L. ANP on 08/31/2015 at 2:46 PM  Between 7am to 7pm - Pager - [phone]  After 7pm go to [email or web address] - password [email or web address]  And look for the night coverage person covering me after hours  [facility]

## 2015-09-01 ENCOUNTER — Observation Stay (HOSPITAL_BASED_OUTPATIENT_CLINIC_OR_DEPARTMENT_OTHER): Payer: Medicare Other

## 2015-09-01 ENCOUNTER — Observation Stay (HOSPITAL_COMMUNITY): Payer: Medicare Other

## 2015-09-01 DIAGNOSIS — G4733 Obstructive sleep apnea (adult) (pediatric): Secondary | ICD-10-CM | POA: Diagnosis not present

## 2015-09-01 DIAGNOSIS — G451 Carotid artery syndrome (hemispheric): Secondary | ICD-10-CM | POA: Diagnosis not present

## 2015-09-01 DIAGNOSIS — E039 Hypothyroidism, unspecified: Secondary | ICD-10-CM | POA: Diagnosis not present

## 2015-09-01 DIAGNOSIS — I6930 Unspecified sequelae of cerebral infarction: Secondary | ICD-10-CM

## 2015-09-01 DIAGNOSIS — M87051 Idiopathic aseptic necrosis of right femur: Secondary | ICD-10-CM | POA: Diagnosis not present

## 2015-09-01 DIAGNOSIS — I951 Orthostatic hypotension: Secondary | ICD-10-CM | POA: Diagnosis not present

## 2015-09-01 DIAGNOSIS — F0390 Unspecified dementia without behavioral disturbance: Secondary | ICD-10-CM | POA: Diagnosis not present

## 2015-09-01 DIAGNOSIS — F0391 Unspecified dementia with behavioral disturbance: Secondary | ICD-10-CM | POA: Diagnosis not present

## 2015-09-01 DIAGNOSIS — D649 Anemia, unspecified: Secondary | ICD-10-CM

## 2015-09-01 DIAGNOSIS — G459 Transient cerebral ischemic attack, unspecified: Secondary | ICD-10-CM | POA: Diagnosis not present

## 2015-09-01 LAB — LIPID PANEL
Cholesterol: 170 mg/dL (ref 0–200)
HDL: 50 mg/dL (ref >40)
LDL Cholesterol: 104 mg/dL — ABNORMAL HIGH (ref 0–99)
TRIGLYCERIDES: 82 mg/dL (ref <150)
Total CHOL/HDL Ratio: 3.4 RATIO
VLDL: 16 mg/dL (ref 0–40)

## 2015-09-01 MED ORDER — MAGNESIUM SULFATE IN D5W 10-5 MG/ML-% IV SOLN
1.0000 g | Freq: Once | INTRAVENOUS | Status: AC
  Administered 2015-09-01: 1 g via INTRAVENOUS
  Filled 2015-09-01: qty 100

## 2015-09-01 MED ORDER — ATORVASTATIN CALCIUM 40 MG PO TABS
40.0000 mg | ORAL_TABLET | Freq: Every day | ORAL | Status: DC
  Administered 2015-09-01: 40 mg via ORAL
  Filled 2015-09-01 (×3): qty 1

## 2015-09-01 NOTE — Progress Notes (Signed)
  Echocardiogram 2D Echocardiogram has been performed.  Jennette Dubin 09/01/2015, 1:13 PM

## 2015-09-01 NOTE — Progress Notes (Addendum)
STROKE TEAM PROGRESS NOTE   HISTORY Barbara Flowers is a 64 y.o. 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 She was in her normal state of health this morning until around 10:30 AM at which point she had sudden onset left arm and leg weakness. She slumped over to the left side and her husband asked why she didn't just use her left arm to push her self backup. The patient was unaware that she was weak on the left side and the husband realized that she was completely flaccid on that side and therefore 911 was called.  On EMS arrival, she had some strength returning to her left arm but was still completely flaccid in her left leg in a code stroke was activated. En route to the hospital, she had rapid improvement in her symptoms and by the arrival of the ambulance, she was essentially back to baseline.  There is no evidence of seizure or loss of consciousness during episode.  LKW: 10:30 AM  tpa given?: no, symptoms resolved   SUBJECTIVE (INTERVAL HISTORY) Her family is at the bedside.  Overall she feels her condition is gradually improving. Her daughter is an NP and a well versed historian.  Daughter reports patient was once diagnosed with LV aneurysm.  Barbara Flowers does not recall if patient was told she had a PFO    OBJECTIVE Temp:  [97.4 F (36.3 C)-98.6 F (37 C)] 98.3 F (36.8 C) (12/11 0948) Pulse Rate:  [59-97] 84 (12/11 0948) Cardiac Rhythm:  [-] Normal sinus rhythm (12/11 0827) Resp:  [12-21] 18 (12/11 0948) BP: (101-148)/(59-87) 107/74 mmHg (12/11 0948) SpO2:  [95 %-100 %] 100 % (12/11 0948) Weight:  [86.592 kg (190 lb 14.4 oz)] 86.592 kg (190 lb 14.4 oz) (12/10 1900)  CBC:  Recent Labs Lab 08/31/15 1135 08/31/15 1142  WBC 5.6  --    NEUTROABS 3.3  --   HGB 12.4 13.9  HCT 38.3 41.0  MCV 90.5  --   PLT 214  --     Basic Metabolic Panel:  Recent Labs Lab 08/31/15 1135 08/31/15 1142  NA 139 142  K 3.5 3.5  CL 109 105  CO2 21*  --   GLUCOSE 111* 105*  BUN 17 20  CREATININE 1.06* 1.00  CALCIUM 9.0  --     Lipid Panel:    Component Value Date/Time   CHOL 170 09/01/2015 0428   TRIG 82 09/01/2015 0428   HDL 50 09/01/2015 0428   CHOLHDL 3.4 09/01/2015 0428   VLDL 16 09/01/2015 0428   LDLCALC 104* 09/01/2015 0428   HgbA1c: No results found for: HGBA1C Urine Drug Screen:    Component Value Date/Time   LABOPIA NONE DETECTED 08/31/2015 1833   COCAINSCRNUR NONE DETECTED 08/31/2015 1833   LABBENZ NONE DETECTED 08/31/2015 1833   AMPHETMU NONE DETECTED 08/31/2015 1833   THCU NONE DETECTED 08/31/2015 1833   LABBARB NONE DETECTED 08/31/2015 1833      IMAGING   Ct Angio Head and Neck W/cm &/or Wo Cm 08/31/2015   1. Negative for emergent large vessel occlusion. No intracranial stenosis or circle Willis branch occlusion identified.  2. Proximal left subclavian artery atherosclerosis and 50-60% stenosis proximal to the left vertebral artery origin which demonstrates moderate stenosis.  3. Left carotid bifurcation atherosclerosis without stenosis.  4. Chronic postoperative changes to the left chest and right cerebral hemisphere. Superimposed chronic appearing small vessel ischemia in the left posterior circulation. No acute cortically based infarct identified. No intracranial metastasis identified.    Dg Chest 2 View 08/31/2015   No edema or consolidation. Postoperative change with clips in left axillary region. Bones osteoporotic.    Ct Head Wo Contrast 08/31/2015   The patient is status post right parietal craniotomy. There is focal area of decreased attenuation in right posterior parietal lobe most likely postsurgical in nature or sequela from previous hemorrhage. Mild cerebral atrophy. Periventricular  and patchy subcortical white matter decreased attenuation probable due to chronic small vessel ischemic changes. No definite acute cortical infarction. There is a lacunar infarct in left thalamus.     Mr Brain Wo Contrast 08/31/2015   1. No convincing acute infarct or other acute intracranial abnormality. Moderately to severely advanced chronic small vessel disease.  2. Postoperative changes to the posterior right hemisphere with mild encephalomalacia.   PHYSICAL EXAM Physical Exam  Constitutional: Appears chronically ill  Psych: Affect appropriate to situation Eyes: No scleral injection HENT: No OP obstrucion Head: Has some hair loss Cardiovascular: Normal rate and regular rhythm.  Respiratory: Effort normal GI: Soft. No distension. There is no tenderness.  Skin: WDI  Neuro: Mental Status: Patient is awake, alert, oriented to person, place, month, year, and situation. Patient is able to give a clear and coherent history. No signs of aphasia or neglect Cranial Nerves: II: Visual Fields are full. Pupils are equal, round, and reactive to light.  III,IV, VI: EOMI without ptosis or diploplia.  V: Facial sensation is symmetric to temperature VII: Facial movement has some mild nasolabial fold flattening on the left VIII: hearing is intact to voice X: Uvula elevates symmetrically XI: Shoulder shrug is symmetric. XII: tongue is midline without atrophy or fasciculations.  Motor: Tone is normal. Bulk is normal. 5/5 strength was present on the right side, as well as left leg. She has 4 minus/5 strength in the left arm with contractures in the left wrist Sensory: Sensation is symmetric to light touch and temperature in the arms and legs. Cerebellar: No clear ataxia in the upper extremities       ASSESSMENT/PLAN [name] is a 64 y.o. 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 She did not receive IV t-PA due to resolution of deficits.   Possible TIA:  Non-dominant  source  Resultant  resolution of deficits  MRI  no acute abnormality  MRA  not performed  CTA of head and neck - 50-60% stenosis proximal to the left vertebral artery origin. High-grade stenosis noted.  Carotid Doppler refer to CTA of the neck.  2D Echo bubble study - pending  LDL - 104  HgbA1c pending  VTE prophylaxis - Lovenox  Diet NPO time specified  clopidogrel 75 mg daily prior to admission, now on aspirin suppository 600 mg daily.  Patient counseled to be compliant with her antithrombotic medications  Ongoing aggressive stroke risk factor management  Therapy recommendations: Pending  Disposition: Pending  Hypertension  Blood pressure tends to run low  Permissive hypertension (OK if < 220/120) but gradually normalize in 5-7 days  Hyperlipidemia  Home meds:  No lipid lowering medications prior to admission  LDL 104, goal <  70  Now on Lipitor 40 mg daily  Continue statin at discharge  Other Stroke Risk Factors  Advanced age  ETOH use  Obesity, Body mass index is 29.03 kg/(m^2).   Hx stroke/TIA  Obstructive sleep apnea on C Pap  Other Active Problems  Chronic orthostatic hypotension  Prolonged QTC interval on admission - Pamelor discontinued  Underlying dementia  History of left ventricular aneurysm  Hospital day # 1  ATTENDING NOTE: Patient was seen and examined by me personally. Documentation of LV aneurysm not readily avaialble as patient just moved her from another stat.  The laboratory and radiographic studies reviewed by me.  ROS completed by me personally and patient has no complaints Assessment and plan completed by me personally and fully documented above. Plans include:    Continuous cardiology  monitoring  ECHO with bubble study pending  Further discussion regarding next steps with medical management given study results.  Of note patient did get admitted for GIB in the past  Condition is unchanged   SIGNED BY: Dr. Elissa Hefty      To contact Stroke Continuity provider, please refer to [email or web address] After hours, contact General Neurology

## 2015-09-01 NOTE — Progress Notes (Addendum)
Patient Demographics:    Barbara Flowers, is a 64 y.o. female, DOB - 1950-10-10, GC:[id number]  Admit date - 08/31/2015   Admitting Physician Elmarie Shiley, MD  Outpatient Primary MD for the patient is HAGUE, Rosalyn Charters, MD  LOS - 1   Chief Complaint  Patient presents with  . Code Stroke        Subjective:    Barbara Flowers today has, No headache, No chest pain, No abdominal pain - No Nausea, No new weakness tingling or numbness, No Cough - SOB.     Assessment  & Plan :     1. TIA with left-sided weakness and dysarthria. Neurology following, MRI brain and CTA head and neck nonacute. However symptoms very suspicious for large vessel TIA. Currently on aspirin suppository and awaiting speech input was on Plavix prior to admission, LDL higher than 70 and placed on statin once able to take medications by mouth, she currently continues to be nothing by mouth for now. Further stroke workup which will include echogram, evaluation by PT-OT-speech.  Hx of Brain aneurysm bleed, ischemic stroke, brain malignancy.   2. Chronic orthostatic hypotension. Continue midodrine as tolerated.   3. Highly prolonged QTC upon admission. For now Pamelor has been discontinued, will give a gram of mag and check EKG. On telemetry.   4. Hypothyroidism. On home dose Synthroid.   5. Underlying depression. Continue Lexapro.   6. OSA - CPAP QHS   7. History of left ventricular aneurysm. On anti-platelet medications for the same.   8. History of anemia. Outpatient age-appropriate workup per PCP.   9. Underlying dementia. Continue home medications. At risk for delirium.    Code Status : Full  Family Communication  : None present  Disposition Plan  : To be decided  Consults  : Neurology  Procedures  :    MRI brain, CTA head and neck nonacute.  DVT Prophylaxis  :  Lovenox   Lab Results  Component Value Date   PLT 214 08/31/2015    Inpatient Medications  Scheduled Meds: . aspirin  600 mg Rectal Daily  . atorvastatin  40 mg Oral q1800  . donepezil  10 mg Oral QHS  . enoxaparin (LOVENOX) injection  40 mg Subcutaneous Q24H  . escitalopram  20 mg Oral Daily  . iron polysaccharides  150 mg Oral Daily  . levothyroxine  75 mcg Oral QAC breakfast  . lidocaine  1 patch Transdermal Q24H  . memantine  10 mg Oral Daily  . mirabegron ER  50 mg Oral Daily  . multivitamin with minerals  1 tablet Oral Daily  . vitamin B-12  1,000 mcg Oral Daily   Continuous Infusions: . dextrose 5 % and 0.9% NaCl 75 mL/hr at 09/01/15 0807   PRN Meds:.senna-docusate  Antibiotics  :     Anti-infectives    None        Objective:   Filed Vitals:   09/01/15 0030 09/01/15 0233 09/01/15 0430 09/01/15 0948  BP: 115/65 125/63 111/59 107/74  Pulse: 64 64 61 84  Temp: 97.9 F (36.6 C) 97.8 F (36.6 C) 97.6 F (36.4 C) 98.3 F (36.8 C)  TempSrc: Oral Oral Oral Oral  Resp: 18 18 18 18   Height:  Weight:      SpO2: 100% 98% 97% 100%    Wt Readings from Last 3 Encounters:  08/31/15 86.592 kg (190 lb 14.4 oz)     Intake/Output Summary (Last 24 hours) at 09/01/15 1009 Last data filed at 09/01/15 [id number]  Gross per 24 hour  Intake      0 ml  Output    600 ml  Net   -600 ml     Physical Exam  Awake Alert, Oriented X 3, No new F.N deficits, chronic left arm contractured, Normal affect Barbara Flowers,PERRAL Supple Neck,No JVD, No cervical lymphadenopathy appriciated.  Symmetrical Chest wall movement, Good air movement bilaterally, CTAB RRR,No Gallops,Rubs or new Murmurs, No Parasternal Heave +ve B.Sounds, Abd Soft, No tenderness, No organomegaly appriciated, No rebound - guarding or rigidity. No Cyanosis, Clubbing or edema, No new Rash or bruise     Data Review:   Micro Results No results found  for this or any previous visit (from the past 240 hour(s)).  Radiology Reports Ct Angio Head W/cm &/or Wo Cm  08/31/2015  CLINICAL DATA:  64 year old female with sudden onset left side weakness this morning at breakfast. Initial code stroke activation, symptoms have improved. Personal history of treated metastatic breast cancer in the 1990s. Initial encounter. EXAM: CT ANGIOGRAPHY HEAD AND NECK TECHNIQUE: Multidetector CT imaging of the head and neck was performed using the standard protocol during bolus administration of intravenous contrast. Multiplanar CT image reconstructions and MIPs were obtained to evaluate the vascular anatomy. Carotid stenosis measurements (when applicable) are obtained utilizing NASCET criteria, using the distal internal carotid diameter as the denominator. CONTRAST:  52mL OMNIPAQUE IOHEXOL 350 MG/ML SOLN COMPARISON:  Head CT without contrast 1141 hours today. FINDINGS: CTA NECK Skeleton: Sequelae of right posterior craniotomy. No acute osseous abnormality identified. Degenerative changes in the spine. Visualized paranasal sinuses and mastoids are clear. Other neck: Postoperative changes to the left axilla and chest wall. Minimal anterior left upper lobe lung changes probably sequelae of radiation. Otherwise negative visible lung parenchyma. No mediastinal lymphadenopathy. Diminutive thyroid. Larynx, pharynx, parapharyngeal spaces, retropharyngeal space, sublingual space, submandibular glands, and parotid glands are within normal limits. Postoperative changes to both globes, otherwise negative orbit and scalp soft tissues. Aortic arch: 3 vessel arch configuration. Calcified and soft plaque at the left subclavian and left CCA origins, but no hemodynamically significant stenosis at those sites. Right carotid system: Negative right CCA origin. Negative right CCA and right carotid bifurcation. Negative cervical right ICA. Left carotid system: Atherosclerosis at the left CCA origin without  significant stenosis. Soft plaque in the medial proximal left CCA at the thoracic inlet, less than 50 % stenosis with respect to the distal vessel. Soft and calcified plaque at the left ICA origin and posterior bulb with no stenosis. Tortuous distal cervical left ICA otherwise is negative. Vertebral arteries:Calcified plaque proximal right subclavian artery without stenosis. Normal right vertebral artery origin. Tortuous right V1 segment. Dominant right vertebral artery is normal to the skullbase. No proximal left subclavian artery stenosis despite plaque. However, there is continued soft and calcified plaque in the proximal left subclavian artery which is narrowed to about 3 mm diameter just upstream of the left vertebral artery origin (50-60%). There is also soft and calcified plaque at the left vertebral artery origin with moderate stenosis (series 405, image 95). The left vertebral artery is non dominant and is normal to the skullbase beyond its origin. CTA HEAD Posterior circulation: No distal vertebral artery stenosis, the right side is mildly  dominant. Patent PICA origins. Patent vertebrobasilar junction. No basilar artery stenosis. Normal SCA origins (duplicated on the right. Fetal type bilateral PCA origins. Bilateral PCA branches are within normal limits. Anterior circulation: Both ICA siphons are patent. Mild calcified plaque on the left. No siphon stenosis. Ophthalmic and posterior communicating artery origins are normal. Normal carotid termini. Normal MCA and ACA origins. Anterior communicating artery and bilateral ACA branches are within normal limits. Left MCA M1 segment, bifurcation, and left MCA branches are within normal limits. Right MCA M1 segment, bifurcation, and right MCA branches are within normal limits. Venous sinuses: Patent. Anatomic variants: Fetal type bilateral PCA origins. Delayed phase: Mild encephalomalacia in the posterior right hemisphere underlying the craniotomy site. Mild ex  vacuo enlargement of the adjacent ventral. Chronic appearing left thalamic lacunar infarct. Small probable chronic left cerebellar lacunar infarct (series 501, image 8). Nonspecific punctate dystrophic calcification left pons. No definite acute cortically based infarct. No abnormal enhancement identified. No intracranial mass lesion. IMPRESSION: 1. Negative for emergent large vessel occlusion. No intracranial stenosis or circle Willis branch occlusion identified. 2. Proximal left subclavian artery atherosclerosis and 50-60% stenosis proximal to the left vertebral artery origin which demonstrates moderate stenosis. 3. Left carotid bifurcation atherosclerosis without stenosis. 4. Chronic postoperative changes to the left chest and right cerebral hemisphere. Superimposed chronic appearing small vessel ischemia in the left posterior circulation. No acute cortically based infarct identified. No intracranial metastasis identified. Electronically Signed   By: Genevie Ann M.D.   On: 08/31/2015 15:02   Dg Chest 2 View  08/31/2015  CLINICAL DATA:  Left-sided numbness. History of left breast carcinoma EXAM: CHEST  2 VIEW COMPARISON:  None. FINDINGS: There is no edema or consolidation. Heart size and pulmonary vascularity are normal. No adenopathy. There is postoperative change on the left with surgical clips in left axilla. There is extensive arthropathy in the left shoulder. There is anterior wedging of a lower thoracic vertebral body. Bones are osteoporotic. IMPRESSION: No edema or consolidation. Postoperative change with clips in left axillary region. Bones osteoporotic. Advanced arthropathy left shoulder. Electronically Signed   By: Lowella Grip III M.D.   On: 08/31/2015 14:54   Ct Head Wo Contrast  08/31/2015  CLINICAL DATA:  Left side weakness starting this morning 11 a.m., history of previous hemorrhage EXAM: CT HEAD WITHOUT CONTRAST TECHNIQUE: Contiguous axial images were obtained from the base of the skull  through the vertex without intravenous contrast. COMPARISON:  None. FINDINGS: No skull fracture is noted. The patient is status post right parietal craniotomy. There is focal area of decreased attenuation in right posterior parietal lobe most likely postsurgical in nature or sequela from previous hemorrhage. Mild cerebral atrophy. Periventricular and patchy subcortical white matter decreased attenuation probable due to chronic small vessel ischemic changes. No definite acute cortical infarction. There is a lacunar infarct in left thalamus. No mass lesion is noted on this unenhanced scan. IMPRESSION: The patient is status post right parietal craniotomy. There is focal area of decreased attenuation in right posterior parietal lobe most likely postsurgical in nature or sequela from previous hemorrhage. Mild cerebral atrophy. Periventricular and patchy subcortical white matter decreased attenuation probable due to chronic small vessel ischemic changes. No definite acute cortical infarction. There is a lacunar infarct in left thalamus. These results were called by telephone at the time of interpretation on 08/31/2015 at 11:55 am to Dr. Leonel Ramsay, who verbally acknowledged these results. Electronically Signed   By: Lahoma Crocker M.D.   On: 08/31/2015 11:56  Ct Angio Neck W/cm &/or Wo/cm  08/31/2015  CLINICAL DATA:  64 year old female with sudden onset left side weakness this morning at breakfast. Initial code stroke activation, symptoms have improved. Personal history of treated metastatic breast cancer in the 1990s. Initial encounter. EXAM: CT ANGIOGRAPHY HEAD AND NECK TECHNIQUE: Multidetector CT imaging of the head and neck was performed using the standard protocol during bolus administration of intravenous contrast. Multiplanar CT image reconstructions and MIPs were obtained to evaluate the vascular anatomy. Carotid stenosis measurements (when applicable) are obtained utilizing NASCET criteria, using the distal  internal carotid diameter as the denominator. CONTRAST:  61mL OMNIPAQUE IOHEXOL 350 MG/ML SOLN COMPARISON:  Head CT without contrast 1141 hours today. FINDINGS: CTA NECK Skeleton: Sequelae of right posterior craniotomy. No acute osseous abnormality identified. Degenerative changes in the spine. Visualized paranasal sinuses and mastoids are clear. Other neck: Postoperative changes to the left axilla and chest wall. Minimal anterior left upper lobe lung changes probably sequelae of radiation. Otherwise negative visible lung parenchyma. No mediastinal lymphadenopathy. Diminutive thyroid. Larynx, pharynx, parapharyngeal spaces, retropharyngeal space, sublingual space, submandibular glands, and parotid glands are within normal limits. Postoperative changes to both globes, otherwise negative orbit and scalp soft tissues. Aortic arch: 3 vessel arch configuration. Calcified and soft plaque at the left subclavian and left CCA origins, but no hemodynamically significant stenosis at those sites. Right carotid system: Negative right CCA origin. Negative right CCA and right carotid bifurcation. Negative cervical right ICA. Left carotid system: Atherosclerosis at the left CCA origin without significant stenosis. Soft plaque in the medial proximal left CCA at the thoracic inlet, less than 50 % stenosis with respect to the distal vessel. Soft and calcified plaque at the left ICA origin and posterior bulb with no stenosis. Tortuous distal cervical left ICA otherwise is negative. Vertebral arteries:Calcified plaque proximal right subclavian artery without stenosis. Normal right vertebral artery origin. Tortuous right V1 segment. Dominant right vertebral artery is normal to the skullbase. No proximal left subclavian artery stenosis despite plaque. However, there is continued soft and calcified plaque in the proximal left subclavian artery which is narrowed to about 3 mm diameter just upstream of the left vertebral artery origin  (50-60%). There is also soft and calcified plaque at the left vertebral artery origin with moderate stenosis (series 405, image 95). The left vertebral artery is non dominant and is normal to the skullbase beyond its origin. CTA HEAD Posterior circulation: No distal vertebral artery stenosis, the right side is mildly dominant. Patent PICA origins. Patent vertebrobasilar junction. No basilar artery stenosis. Normal SCA origins (duplicated on the right. Fetal type bilateral PCA origins. Bilateral PCA branches are within normal limits. Anterior circulation: Both ICA siphons are patent. Mild calcified plaque on the left. No siphon stenosis. Ophthalmic and posterior communicating artery origins are normal. Normal carotid termini. Normal MCA and ACA origins. Anterior communicating artery and bilateral ACA branches are within normal limits. Left MCA M1 segment, bifurcation, and left MCA branches are within normal limits. Right MCA M1 segment, bifurcation, and right MCA branches are within normal limits. Venous sinuses: Patent. Anatomic variants: Fetal type bilateral PCA origins. Delayed phase: Mild encephalomalacia in the posterior right hemisphere underlying the craniotomy site. Mild ex vacuo enlargement of the adjacent ventral. Chronic appearing left thalamic lacunar infarct. Small probable chronic left cerebellar lacunar infarct (series 501, image 8). Nonspecific punctate dystrophic calcification left pons. No definite acute cortically based infarct. No abnormal enhancement identified. No intracranial mass lesion. IMPRESSION: 1. Negative for emergent large vessel  occlusion. No intracranial stenosis or circle Willis branch occlusion identified. 2. Proximal left subclavian artery atherosclerosis and 50-60% stenosis proximal to the left vertebral artery origin which demonstrates moderate stenosis. 3. Left carotid bifurcation atherosclerosis without stenosis. 4. Chronic postoperative changes to the left chest and right  cerebral hemisphere. Superimposed chronic appearing small vessel ischemia in the left posterior circulation. No acute cortically based infarct identified. No intracranial metastasis identified. Electronically Signed   By: Genevie Ann M.D.   On: 08/31/2015 15:02   Mr Brain Wo Contrast  08/31/2015  CLINICAL DATA:  64 year old female with sudden onset left side weakness this morning at breakfast. Initial code stroke activation, symptoms have improved. Personal history of treated metastatic breast cancer in the 1990s. Initial encounter. EXAM: MRI HEAD WITHOUT CONTRAST TECHNIQUE: Multiplanar, multiecho pulse sequences of the brain and surrounding structures were obtained without intravenous contrast. COMPARISON:  CTA head and neck 1416 hours today. FINDINGS: Major intracranial vascular flow voids are preserved. No convincing restricted diffusion or evidence of acute infarction. Chronic lacunar infarct of the left thalamus. Small chronic lacunar infarcts in both cerebellar hemispheres, greater on the left. Multiple scattered chronic micro hemorrhages in both cerebral and cerebellar hemispheres. Patchy and confluent bilateral cerebral white matter T2 and FLAIR hyperintensity, which is also contiguous with encephalomalacia underlying 8 chronic right posterior craniotomy site. No midline shift, mass effect, evidence of mass lesion, ventriculomegaly, extra-axial collection or acute intracranial hemorrhage. Cervicomedullary junction and pituitary are within normal limits. Negative visualized cervical spine. Normal visible bone marrow signal. Paranasal sinuses are clear. Postoperative changes to both globes. Visible internal auditory structures appear normal. Trace retained secretions in the nasopharynx. No acute scalp soft tissue findings. IMPRESSION: 1. No convincing acute infarct or other acute intracranial abnormality. Moderately to severely advanced chronic small vessel disease. 2. Postoperative changes to the posterior  right hemisphere with mild encephalomalacia. Electronically Signed   By: Genevie Ann M.D.   On: 08/31/2015 16:22     CBC  Recent Labs Lab 08/31/15 1135 08/31/15 1142  WBC 5.6  --   HGB 12.4 13.9  HCT 38.3 41.0  PLT 214  --   MCV 90.5  --   MCH 29.3  --   MCHC 32.4  --   RDW 14.3  --   LYMPHSABS 1.6  --   MONOABS 0.5  --   EOSABS 0.2  --   BASOSABS 0.1  --     Chemistries   Recent Labs Lab 08/31/15 1135 08/31/15 1142  NA 139 142  K 3.5 3.5  CL 109 105  CO2 21*  --   GLUCOSE 111* 105*  BUN 17 20  CREATININE 1.06* 1.00  CALCIUM 9.0  --   AST 25  --   ALT 17  --   ALKPHOS 92  --   BILITOT 0.7  --    ------------------------------------------------------------------------------------------------------------------ estimated creatinine clearance is 64.6 mL/min (by C-G formula based on Cr of 1). ------------------------------------------------------------------------------------------------------------------ No results for input(s): HGBA1C in the last 72 hours. ------------------------------------------------------------------------------------------------------------------  Recent Labs  09/01/15 0428  CHOL 170  HDL 50  LDLCALC 104*  TRIG 82  CHOLHDL 3.4   No results found for: HGBA1C  ------------------------------------------------------------------------------------------------------------------  Recent Labs  08/31/15 1506  TSH 1.178   ------------------------------------------------------------------------------------------------------------------ No results for input(s): VITAMINB12, FOLATE, FERRITIN, TIBC, IRON, RETICCTPCT in the last 72 hours.  Coagulation profile  Recent Labs Lab 08/31/15 1135  INR 1.13    No results for input(s): DDIMER in the last 72 hours.  Cardiac Enzymes No results for input(s): CKMB, TROPONINI, MYOGLOBIN in the last 168 hours.  Invalid input(s):  CK ------------------------------------------------------------------------------------------------------------------ Invalid input(s): POCBNP   Time Spent in minutes  35   Shikira Folino K M.D on 09/01/2015 at 10:09 AM  Between 7am to 7pm - Pager - [phone]  After 7pm go to [email or web address] - password [facility]  [facility] -  Office  [phone]

## 2015-09-01 NOTE — Evaluation (Addendum)
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 Pt admitted with inability to move L side.  MRI is (-) for acute abnormality.    Clinical Impression   Pt required extensive assist even PTA with basic ADL tasks for approximately the last 3 months since her last hosptalization. She states she did receive HHPT at that time. She is currently at mod to max assist with basic ADL. She was admitted with inability to move L side. She currently states her left UE has returned to baseline with regards to movement/function. She has some limitations even PTA with L hand from previous hand surgery and stroke but she states she feels it has returned to her usual baseline. Will follow on acute to progress ADL/safety and strengthening for d/c next venue. Pt is motivated and would like to return to walking again--if agreeable, could benefit from SNF rehab ; otherwise recommend [facility].    Follow Up Recommendations  Home health OT;Supervision/Assistance - 24 hour (versus SNF if pt agreeable)   Equipment Recommendations  3 in 1 bedside comode    Recommendations for Other Services       Precautions / Restrictions Precautions Precautions: Fall      Mobility Bed Mobility Overal  bed mobility: Needs Assistance Bed Mobility: Sit to Sidelying;Sidelying to Sit   Sidelying to sit: Mod assist     Sit to sidelying: Min assist;Mod assist General bed mobility comments: assist for trunk to upright sitting; assist for LEs onto bed to return to sidelying.   Transfers Overall transfer level: Needs assistance Equipment used: None Transfers: Sit to/from Stand Sit to Stand: Mod assist         General transfer comment: decreased ability to use L UE to help with transition to standing. X 3 standing trials. Cues for weight shift.     Balance Overall balance assessment: Needs assistance   Sitting balance-Leahy Scale: Good       Standing balance-Leahy Scale: Poor                              ADL Overall ADL's : Needs assistance/impaired Eating/Feeding: NPO   Grooming: Wash/dry hands;Set up;Bed level   Upper Body Bathing: Minimal assitance;Sitting   Lower Body Bathing: Maximal assistance;Sit to/from stand   Upper Body Dressing : Minimal assistance;Sitting   Lower Body Dressing: Maximal assistance;Sit to/from stand   Toilet Transfer: Moderate assistance Toilet Transfer Details (indicate cue type and reason): sit to stand only. see below.  Toileting- Clothing Manipulation and Hygiene: Maximal assistance;Sit to/from stand         General ADL Comments: Pt stood from EOB X 3 but did not want to attempt pivot to [facility] with only +1 assist available. Pt states it was +2 with nursing to pivot to [facility] earlier. She states her husband "is a big man and  can help me." She stood with mod assist but has decreased use of L UE to help with transition to standing.      Vision     Perception     Praxis      Pertinent Vitals/Pain Pain Assessment: Faces (pt states some discomfort L hip but didnt rate with movement.) Faces Pain Scale: Hurts a little bit Pain Location: L hip Pain Descriptors / Indicators: Sore Pain Intervention(s): Repositioned     Hand  Dominance Right   Extremity/Trunk Assessment Upper Extremity Assessment Upper Extremity Assessment: LUE deficits/detail LUE Deficits / Details: residual deficits from previous stroke and L hand surgery from a brachial plexus injury. Pt with approximately 40 degrees AROm shoulder flexion, elbow grossly 3+/5, 3rd through 5th digits tend to stay flexed although can be passively extended.            Communication Communication Communication: No difficulties   Cognition Arousal/Alertness: Awake/alert Behavior During Therapy: WFL for tasks assessed/performed Overall Cognitive Status: History of cognitive impairments - at baseline                     General Comments       Exercises       Shoulder Instructions      Home Living Family/patient expects to be discharged to:: Private residence Living Arrangements: Spouse/significant other Available Help at Discharge: Family Type of Home: House Home Access: Ramped entrance           Bathroom Shower/Tub: Hospital doctor Toilet: [place]: Wheelchair - Insurance claims handler - 2 wheels          Prior Functioning/Environment Level of Independence: Needs assistance  Gait / Transfers Assistance Needed: husband pushes pt in wheelchair. pt states about 3 months since she has been able to walk (did not use a device then). husband helps with all pivot transfers and bed mobility. ADL's / Homemaking Assistance Needed: husband helps her transfer to shower chair. he also helps with bathing, dressing and pivot transfers from wheelchair to toilet.   Comments: pt states she received HH therapy after hospitalization about 3 months ago but hasnt returned to her prior level of walking.     OT Diagnosis: Generalized weakness   OT Problem List: Decreased strength;Decreased knowledge of use of DME or AE;Impaired UE functional use;Impaired balance (sitting and/or standing)   OT Treatment/Interventions:  Self-care/ADL training;Patient/family education;Therapeutic activities;DME and/or AE instruction    OT Goals(Current goals can be found in the care plan section) Acute Rehab OT Goals Patient Stated Goal: wants to return to walking.  OT Goal Formulation: With patient Time For Goal Achievement: 09/15/15 Potential to Achieve Goals: Good  OT Frequency: Min 2X/week   Barriers to D/C:            Co-evaluation              End of Session Equipment Utilized During Treatment: Gait belt  Activity Tolerance: Patient tolerated treatment well Patient left: in bed;with call bell/phone within reach;with bed alarm set   Time: 1325-1405 OT Time Calculation (min): 40 min Charges:  OT General Charges $OT Visit: 1 Procedure OT Evaluation $Initial OT Evaluation Tier I: 1 Procedure OT Treatments $Self Care/Home Management : 8-22 mins $Therapeutic Activity: 8-22 mins G-Codes: OT G-codes **NOT FOR INPATIENT CLASS** Functional Assessment Tool Used: clinical judgement. Functional Limitation: Self care Self Care Current Status [phone]): At least 40 percent but less than 60 percent impaired, limited or  restricted Self Care Goal Status [phone]): At least 20 percent but less than 40 percent impaired, limited or restricted  [place], [place] 09/01/2015, 2:33 PM

## 2015-09-01 NOTE — Evaluation (Signed)
Clinical/Bedside Swallow Evaluation Patient Details  Name: Barbara Flowers MRN: [id number] Date of Birth: 06-22-1951  Today's Date: 09/01/2015 Time: SLP Start Time (ACUTE ONLY): 1634 SLP Stop Time (ACUTE ONLY): 1651 SLP Time Calculation (min) (ACUTE ONLY): 17 min  Past Medical History:  Past Medical History  Diagnosis Date  . Anemia   . Seizures ([place])   . Cancer ([place]) 1990    L breast  . Brain tumor ([place])   . Stroke [facility]) 2005    hemorrhagic  . Blood transfusion without reported diagnosis    Past Surgical History:  Past Surgical History  Procedure Laterality Date  . Lymph node biopsy    . Hand surgery Left 2015  . Gastric bypass  1985  . Cholecystectomy    . Craniotomy for hemispherectomy total / partial Right   . Breast lumpectomy Left     post chemotherapy and radiation  . 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 Patient has recently moved to [place] from [place] and has just established with a primary care physician in [place].  Admitted with possible TIA, sudden onset left hemiparesis.  MRI negative for acute event.    Assessment / Plan / Recommendation Clinical Impression  Pt presents with normal oropharyngeal swallow with adequate mastication, brisk swallow response, and no overt s/s of aspiration.  Recommend advancing to a  regular diet with thin liquids; meds whole in puree.  No further SLP f/u is needed.  No speech-language evaluation necessary; will sign off.     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 Patient has recently moved to [place] from [place] and has just established with a primary care physician in [place].  Admitted with possible TIA, sudden onset left hemiparesis.  MRI negative for acute event.  Type of Study: Bedside Swallow Evaluation Diet Prior to this Study: NPO Temperature Spikes Noted: No Respiratory Status: Room air History of Recent Intubation: No Behavior/Cognition: Alert;Cooperative;Pleasant mood Oral Cavity Assessment: Within Functional Limits Oral Care Completed by SLP: No Oral Cavity - Dentition: Poor condition Vision: Functional for self-feeding Self-Feeding Abilities: Able to feed self;Needs assist Patient Positioning: Upright in bed  Baseline Vocal Quality: Normal Volitional Cough: Strong Volitional Swallow: Able to elicit    Oral/Motor/Sensory Function Overall Oral Motor/Sensory Function: Within functional limits   Ice Chips Ice chips: Within  functional limits   Thin Liquid Thin Liquid: Within functional limits    Nectar Thick Nectar Thick Liquid: Not tested   Honey Thick Honey Thick Liquid: Not tested   Puree Puree: Within functional limits   Solid Solid: Within functional limits       Barbara Flowers 09/01/2015,4:52 PM

## 2015-09-01 NOTE — Progress Notes (Signed)
PT Cancellation Note  Patient Details Name: Barbara Flowers MRN: [id number] DOB: [birth year]   Cancelled Treatment:    Reason Eval/Treat Not Completed: Patient at procedure or test/unavailable, gone for test earlier in day and now with IV team. Will check back tomorrow.   [place], [place] 09/01/2015, 3:57 PM

## 2015-09-02 DIAGNOSIS — G459 Transient cerebral ischemic attack, unspecified: Principal | ICD-10-CM

## 2015-09-02 DIAGNOSIS — D500 Iron deficiency anemia secondary to blood loss (chronic): Secondary | ICD-10-CM | POA: Diagnosis not present

## 2015-09-02 DIAGNOSIS — E038 Other specified hypothyroidism: Secondary | ICD-10-CM

## 2015-09-02 DIAGNOSIS — F0390 Unspecified dementia without behavioral disturbance: Secondary | ICD-10-CM | POA: Diagnosis not present

## 2015-09-02 DIAGNOSIS — I951 Orthostatic hypotension: Secondary | ICD-10-CM | POA: Diagnosis not present

## 2015-09-02 LAB — URINE CULTURE

## 2015-09-02 LAB — HEMOGLOBIN A1C
HEMOGLOBIN A1C: 5.7 % — AB (ref 4.8–5.6)
MEAN PLASMA GLUCOSE: 117 mg/dL

## 2015-09-02 MED ORDER — CLOPIDOGREL BISULFATE 75 MG PO TABS
75.0000 mg | ORAL_TABLET | Freq: Every day | ORAL | Status: DC
  Administered 2015-09-02: 75 mg via ORAL
  Filled 2015-09-02: qty 1

## 2015-09-02 MED ORDER — ATORVASTATIN CALCIUM 40 MG PO TABS: 40.0000 mg | ORAL_TABLET | Freq: Every day | ORAL | Status: DC

## 2015-09-02 NOTE — Progress Notes (Signed)
CM spoke with patient's husband and daughter via 3 way phone call to discuss discharge disposition.  Family states they were told that patient would be "admitted" and would be eligible for a physical rehab facility at discharge.  CM explained that patient is not eligible for SNF rehab, as she is hospitalized under observation status and does not have a 3 day qualifying stay. Patient's daughter does not feel that observation status is appropriate based on patient's extensive medical history. CM explained that each hospitalization is separate and only takes into account the acute issue and the intensity of care being provided.  Family continues to feel that patient is not ready for discharge and that patient's status can be overturned so that Medicare will pay for SNF rehab.  They are requesting to speak with Dr Candiss Norse (discharging physician) regarding patient's medical readiness for discharge.  CM informed Dr Candiss Norse.  CM will continue to follow to attempt to make arrangements for [facility] services at discharge.  Lorne Skeens RN, MSN [phone]

## 2015-09-02 NOTE — Discharge Instructions (Signed)
Follow with Primary MD HAGUE, Rosalyn Charters, MD in 7 days   Get CBC, CMP, 2 view Chest X ray checked  by Primary MD next visit.    Activity: As tolerated with Full fall precautions use walker/cane & assistance as needed   Disposition HHPT/SNF if qualifies   Diet:   Heart Healthy  with feeding assistance and aspiration precautions.  For Heart failure patients - Check your Weight same time everyday, if you gain over 2 pounds, or you develop in leg swelling, experience more shortness of breath or chest pain, call your Primary MD immediately. Follow Cardiac Low Salt Diet and 1.5 lit/day fluid restriction.   On your next visit with your primary care physician please Get Medicines reviewed and adjusted.   Please request your Prim.MD to go over all Hospital Tests and Procedure/Radiological results at the follow up, please get all Hospital records sent to your Prim MD by signing hospital release before you go home.   If you experience worsening of your admission symptoms, develop shortness of breath, life threatening emergency, suicidal or homicidal thoughts you must seek medical attention immediately by calling 911 or calling your MD immediately  if symptoms less severe.  You Must read complete instructions/literature along with all the possible adverse reactions/side effects for all the Medicines you take and that have been prescribed to you. Take any new Medicines after you have completely understood and accpet all the possible adverse reactions/side effects.   Do not drive, operating heavy machinery, perform activities at heights, swimming or participation in water activities or provide baby sitting services if your were admitted for syncope or siezures until you have seen by Primary MD or a Neurologist and advised to do so again.  Do not drive when taking Pain medications.    Do not take more than prescribed Pain, Sleep and Anxiety Medications  Special Instructions: If you have smoked or  chewed Tobacco  in the last 2 yrs please stop smoking, stop any regular Alcohol  and or any Recreational drug use.  Wear Seat belts while driving.   Please note  You were cared for by a hospitalist during your hospital stay. If you have any questions about your discharge medications or the care you received while you were in the hospital after you are discharged, you can call the unit and asked to speak with the hospitalist on call if the hospitalist that took care of you is not available. Once you are discharged, your primary care physician will handle any further medical issues. Please note that NO REFILLS for any discharge medications will be authorized once you are discharged, as it is imperative that you return to your primary care physician (or establish a relationship with a primary care physician if you do not have one) for your aftercare needs so that they can reassess your need for medications and monitor your lab values.

## 2015-09-02 NOTE — Care Management Note (Signed)
Case Management Note  Patient Details  Name: Barbara Flowers MRN: [id number] Date of Birth: [birth year]  Subjective/Objective:                    Action/Plan: CM met with patient and husband to discuss discharge planning. Patient's husband has chosen [facility] for HHPT/OT/RN/NA. Sheppard Evens with [facility] was notified and has accepted the referral for discharge home today.  Patient's primary contact is her husband Barbara Flowers. Address and phone number are correct in Epic.  Bedside RN updated.  Expected Discharge Date:                  Expected Discharge Plan:  [place]  In-House Referral:     Discharge planning Services  CM Consult  Post Acute Care Choice:    Choice offered to:  Spouse  DME Arranged:    DME Agency:  [facility]  HH Arranged:  RN, PT, OT, Nurse's Aide [place] Agency:  [facility]  Status of Service:  Completed, signed off  Medicare Important Message Given:    Date Medicare IM Given:    Medicare IM give by:    Date Additional Medicare IM Given:    Additional Medicare Important Message give by:     If discussed at [place] of Stay Meetings, dates discussed:    Additional Comments:  Rolm Baptise, RN 09/02/2015, 4:47 PM

## 2015-09-02 NOTE — Progress Notes (Signed)
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 Pt admitted with inability to move L side.  MRI is (-) for acute abnormality.    OT comments  Patient progressing towards OT goals, will continue plan of care for now. Unsure of patient's PLOF, but currently pt functioning at an overall mod assist level for bed mobility and lateral scooting closer to [place] overall min to max assist with ADLs. Pt reports husband was assisting PTA and that she has been w/c bound for the past 3 months. Pt is anxious to walk again.    Follow Up Recommendations  Home health OT;Supervision/Assistance - 24 hour    Equipment Recommendations  3 in 1 bedside comode    Recommendations for Other Services  None at this time   Precautions / Restrictions Precautions Precautions: Fall Precaution Comments: residual deficits from h/o CVA Restrictions Weight Bearing Restrictions: No    Mobility Bed Mobility Overal bed mobility: Needs Assistance Bed Mobility: Sidelying to Sit;Sit to Sidelying   Sidelying to sit: Mod assist     Sit to sidelying: Mod assist General bed mobility comments: Assistance needed for management of BLEs. Cues for seqencing and safety.   Transfers General transfer  comment: did not occur, pt did perform lateral scoots on bed requiring mod assist    Balance Overall balance assessment: Needs assistance Sitting-balance support: No upper extremity supported;Feet supported Sitting balance-Leahy Scale: Fair   ADL Overall ADL's : Needs assistance/impaired   General ADL Comments: Pt found slumped in bed. Therapist assisted patient to EOB, then patient engaged in lateral scooting towards HOB to lay back down; pt required mod assist for this. Pt refused getting out of bed stating, "I'm too tired". Pt stated she was OOB earlier when husband was here.      Cognition   Behavior During Therapy: WFL for tasks assessed/performed Overall Cognitive Status: No family/caregiver present to determine baseline cognitive functioning (pt with some difficulty answering questions)                 Pertinent Vitals/ Pain       Pain Assessment: Faces Faces Pain Scale: Hurts even more Pain Location: right hip when attempting to lay on back Pain Descriptors / Indicators: Sore;Grimacing;Guarding Pain Intervention(s): Repositioned;Monitored during session   Frequency Min 2X/week     Progress Toward Goals  OT Goals(current goals can now befound in the care plan section)  Progress towards OT goals: Progressing toward goals     Plan Discharge plan remains appropriate       Activity Tolerance Patient tolerated treatment well   Patient Left in bed;with call bell/phone within reach;with bed alarm set    Time: 1420-1432 OT Time Calculation (min): 12 min  Charges: OT General Charges $OT Visit: 1 Procedure OT Treatments $Self Care/Home Management : 8-22 mins  Samyiah Halvorsen , MS, OTR/L, CLT Pager: [phone]  09/02/2015, 2:40 PM

## 2015-09-02 NOTE — Progress Notes (Signed)
Discharged to home via w/c with husband. Discharge instructions reviewed with Dr and pt's husband and case Freight forwarder.

## 2015-09-02 NOTE — Progress Notes (Signed)
STROKE TEAM PROGRESS NOTE   HISTORY Barbara Flowers is a 64 y.o. 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 She was in her normal state of health this morning until around 10:30 AM at which point she had sudden onset left arm and leg weakness. She slumped over to the left side and her husband asked why she didn't just use her left arm to push her self backup. The patient was unaware that she was weak on the left side and the husband realized that she was completely flaccid on that side and therefore 911 was called.  On EMS arrival, she had some strength returning to her left arm but was still completely flaccid in her left leg in a code stroke was activated. En route to the hospital, she had rapid improvement in her symptoms and by the arrival of the ambulance, she was essentially back to baseline.  There is no evidence of seizure or loss of consciousness during episode.  LKW: 08/31/2015 at10:30 AM  tpa given?: no, symptoms resolved   SUBJECTIVE (INTERVAL HISTORY) Her husband is at the bedside. He is wanting to get her into a rehab center. He mentioned that her daughter is a NP.   OBJECTIVE Temp:  [97.9 F (36.6 C)-98.4 F (36.9 C)] 98.3 F (36.8 C) (12/12 1003) Pulse Rate:  [65-84] 72 (12/12 1003) Cardiac Rhythm:  [-] Normal sinus rhythm;Bundle branch block (12/12 0700) Resp:  [18] 18 (12/12 1003) BP: (107-139)/(51-65) 112/65 mmHg (12/12 1003) SpO2:  [95 %-100 %] 100 % (12/12 1003)  CBC:   Recent Labs Lab 08/31/15 1135 08/31/15 1142  WBC 5.6  --   NEUTROABS 3.3  --   HGB 12.4 13.9  HCT 38.3 41.0  MCV 90.5  --   PLT 214  --     Basic Metabolic Panel:   Recent Labs Lab 08/31/15 1135 08/31/15 1142  NA 139 142  K 3.5 3.5  CL  109 105  CO2 21*  --   GLUCOSE 111* 105*  BUN 17 20  CREATININE 1.06* 1.00  CALCIUM 9.0  --     Lipid Panel:     Component Value Date/Time   CHOL 170 09/01/2015 0428   TRIG 82 09/01/2015 0428   HDL 50 09/01/2015 0428   CHOLHDL 3.4 09/01/2015 0428   VLDL 16 09/01/2015 0428   LDLCALC 104* 09/01/2015 0428   HgbA1c:  Lab Results  Component Value Date   HGBA1C 5.7* 09/01/2015   Urine Drug Screen:     Component Value Date/Time   LABOPIA NONE DETECTED 08/31/2015 1833   COCAINSCRNUR NONE DETECTED 08/31/2015 1833   LABBENZ NONE DETECTED 08/31/2015 1833   AMPHETMU NONE DETECTED 08/31/2015 1833   THCU NONE DETECTED 08/31/2015 1833   LABBARB NONE DETECTED 08/31/2015 1833      IMAGING  Ct Head Wo Contrast 08/31/2015   The patient is status post right parietal craniotomy. There is focal area of decreased attenuation in right posterior parietal lobe most likely postsurgical in nature or sequela from previous hemorrhage. Mild cerebral atrophy. Periventricular and patchy subcortical white matter decreased attenuation probable due to chronic small vessel ischemic changes. No definite acute cortical infarction. There is a lacunar infarct in left thalamus.   Ct Angio Head and Neck W/cm &/or Wo Cm 08/31/2015  1. Negative for emergent large vessel occlusion. No intracranial stenosis or circle Willis branch occlusion identified.  2. Proximal left subclavian artery atherosclerosis and 50-60% stenosis proximal to the left vertebral artery origin which demonstrates moderate stenosis.  3. Left carotid bifurcation atherosclerosis without stenosis.  4. Chronic postoperative changes to the left chest and right cerebral hemisphere. Superimposed chronic appearing small vessel ischemia in the left posterior circulation. No acute cortically based infarct identified. No intracranial metastasis identified.   Mr Brain Wo Contrast 08/31/2015   1. No convincing acute infarct or other acute intracranial  abnormality. Moderately to severely advanced chronic small vessel disease.  2. Postoperative changes to the posterior right hemisphere with mild encephalomalacia.   Dg Chest 2 View 08/31/2015   No edema or consolidation. Postoperative change with clips in left axillary region. Bones osteoporotic.   2D Echocardiogram  - Left ventricle: The cavity size was normal. Wall thickness wasnormal. Systolic function was normal. The estimated ejectionfraction was in the range of 55% to 60%. Wall motion was normal;there were no regional wall motion abnormalities. Dopplerparameters are consistent with abnormal left ventricularrelaxation (grade 1 diastolic dysfunction).   PHYSICAL EXAM Constitutional: Appears chronically ill  Psych: Affect appropriate to situation Eyes: No scleral injection HENT: No OP obstrucion Head: Has some hair loss Cardiovascular: Normal rate and regular rhythm.  Respiratory: Effort normal GI: Soft. No distension. There is no tenderness.  Skin: WDI  Neuro: Mental Status: Patient is awake, alert, oriented to person, place, month, year, and situation. Patient is able to give a clear and coherent history. No signs of aphasia or neglect Cranial Nerves: II: Visual Fields are full. Pupils are equal, round, and reactive to light.  III,IV, VI: EOMI without ptosis or diploplia.  V: Facial sensation is symmetric to temperature VII: Facial movement has some mild nasolabial fold flattening on the left VIII: hearing is intact to voice X: Uvula elevates symmetrically XI: Shoulder shrug is symmetric. XII: tongue is midline without atrophy or fasciculations.  Motor: Tone is normal. Bulk is normal. 5/5 strength was present on the right side, as well as left leg. She has 4 minus/5 strength in the left arm with contractures in the left wrist Sensory: Sensation is symmetric to light touch and temperature in the arms and legs. Cerebellar: No clear ataxia in the upper  extremities   ASSESSMENT/PLAN Ms. Barbara Flowers is a 64 y.o. 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 She did not receive IV t-PA due to resolution of deficits.   Possible right brain TIA  Resultant  resolution of deficits  MRI  no acute abnormality  MRA  not performed  CTA of head and neck - 50-60% stenosis proximal to the left vertebral artery origin. High-grade stenosis noted.  Carotid Doppler refer to CTA of the neck.  2D Echo bubble study No source of embolus   LDL - 104  HgbA1c 5.7  VTE prophylaxis - Lovenox Diet regular Room service appropriate?: Yes; Fluid consistency:: Thin  clopidogrel 75 mg daily prior to admission, now on aspirin suppository 600 mg daily. Change to plavix 75 mg daily  Patient counseled to be compliant with her antithrombotic medications  Ongoing aggressive stroke risk factor management  Therapy recommendations:  HHPT, [place] OT. Family interested in placement - working with care management - Dr. Leonie Man discussed with  DR. Candiss Norse, will defer medical stability for d/c to them.  Stroke will sign off  Follow up Dr. Leonie Man in 1 month  Disposition: Pending  Chronic orthostatic hypotension Hypertension  Blood pressure tends to run low  Permissive hypertension (OK if < 220/120) but gradually normalize in 5-7 days  Hyperlipidemia  Home meds:  No lipid lowering medications prior to admission  LDL 104, goal < 70  Now on Lipitor 40 mg daily  Continue statin at discharge  Other Stroke Risk Factors  Advanced age  ETOH use  Hx stroke/TIA   Obstructive sleep apnea on C Pap  Other Active Problems  Prolonged QTC interval on admission - Pamelor discontinued  Underlying dementia  History of left ventricular  aneurysm  Hypothyroid, on synthroid  HX GIB  Hx anemia  Hospital day # 2  Ronaldo Miyamoto Edgard for Pager information 09/02/2015 12:23 PM  I have personally examined this patient, reviewed notes, independently viewed imaging studies, participated in medical decision making and plan of care. I have made any additions or clarifications directly to the above note. Agree with note above. Patient can be discharged home. Follow-up as an outpatient in stroke clinic. Recommend antiplatelet therapy and aggressive risk factor modification. Discussion with patient and family and answered questions.  Antony Contras, MD Medical Director [facility] Pager: [phone] 09/02/2015 5:37 PM  To contact Stroke Continuity provider, please refer to [email or web address] After hours, contact General Neurology

## 2015-09-02 NOTE — Evaluation (Signed)
Physical Therapy Evaluation Patient Details Name: Barbara Flowers MRN: [id number] DOB: March 15, 1951 Today's Date: 09/02/2015   History of Present Illness  This is a 64 year old female patient with significant past medical history of sleep apnea on CPAP, hypothyroidism, dementia, orthostatic hypotension on midodrine, overactive bladder, peripheral neuropathy, avascular necrosis of the right hip, newly diagnosed LV aneurysm on Plavix, old CVA with residual left-sided weakness, prior tendon surgery left hand urinary to chronic brachial plexus injury, history of breast cancer status post lumpectomy chemotherapy and radiation in the 1990s, history of prior gastric bypass, history of chronic anemia as well as partial right parietal craniotomy in setting of metastatic brain tumor in 2010. Pt admitted with inability to move L side.  MRI is (-) for acute abnormality.   Clinical Impression  Pt admitted with above diagnosis. Noted hip MRI showed severe Rt hip arthritis. Pt reports she wants to return to walking (primarily uses wheelchair, propelled by husband) currently. Pt currently with functional limitations due to the deficits listed below (see PT Problem List). She did demonstrate adequate strength for ambulation, however unsure if she can tolerate due to severe arthritis. Pt may benefit from skilled HHPT to increase her independence and safety with mobility. No further acute PT needs as pt is back to her baseline and will have assist of husband as PTA (per pt).    Follow Up Recommendations Home health PT    Equipment Recommendations  None recommended by PT    Recommendations for Other Services       Precautions / Restrictions Precautions Precautions: Fall      Mobility  Bed Mobility               General bed mobility comments: up in recliner; preparing to d/c home today  Transfers Overall transfer level: Needs assistance Equipment used: None Transfers: Sit to/from Stand Sit to Stand:  Min assist         General transfer comment: assist to weight-shift forward over BOS (leans posteriorly slightly); no significant assist to extend legs to upright  Ambulation/Gait             General Gait Details: not tested  Stairs            Wheelchair Mobility    Modified Rankin (Stroke Patients Only) Modified Rankin (Stroke Patients Only) Pre-Morbid Rankin Score: Severe disability Modified Rankin: Severe disability     Balance     Sitting balance-Leahy Scale: Good       Standing balance-Leahy Scale: Poor                               Pertinent Vitals/Pain Pain Assessment: No/denies pain    Home Living Family/patient expects to be discharged to:: Private residence Living Arrangements: Spouse/significant other Available Help at Discharge: Family Type of Home: House Home Access: Ramped entrance       Home Equipment: Wheelchair - Insurance claims handler - 2 wheels      Prior Function Level of Independence: Needs assistance   Gait / Transfers Assistance Needed: husband primarily pushes pt in wheelchair x past 3 months (since d/c home from rehab, per pt). Stand pivots with husband and no device; walks with husband (no device) into her bathroom.   ADL's / Homemaking Assistance Needed: husband helps her transfer to shower chair. he also helps with bathing, dressing and pivot transfers from wheelchair to toilet.  Comments: pt states she received HH therapy after hospitalization about  3 months ago but hasnt returned to her prior level of walking.      Hand Dominance   Dominant Hand: Right    Extremity/Trunk Assessment   Upper Extremity Assessment: Defer to OT evaluation           Lower Extremity Assessment: Generalized weakness (Rt=Lt knee extension 4/5, 5/5 DF bil)      Cervical / Trunk Assessment: Kyphotic  Communication   Communication: No difficulties  Cognition Arousal/Alertness: Awake/alert Behavior During Therapy:  WFL for tasks assessed/performed Overall Cognitive Status: No family/caregiver present to determine baseline cognitive functioning (appears answers  correctly)       Memory: Decreased short-term memory (could not remember husband's phone #)              General Comments      Exercises        Assessment/Plan    PT Assessment All further PT needs can be met in the next venue of care  PT Diagnosis Difficulty walking;Generalized weakness   PT Problem List Decreased strength;Decreased balance;Decreased mobility;Decreased cognition;Decreased knowledge of use of DME  PT Treatment Interventions     PT Goals (Current goals can be found in the Care Plan section) Acute Rehab PT Goals Patient Stated Goal: wants to return to walking.  PT Goal Formulation: All assessment and education complete, DC therapy    Frequency     Barriers to discharge        Co-evaluation               End of Session Equipment Utilized During Treatment: Gait belt Activity Tolerance: Patient tolerated treatment well Patient left: in chair;with call bell/phone within reach (was up in chair by nursing) Nurse Communication: Other (comment) (OK to d/c home as appears back to baseline)    Functional Assessment Tool Used: clinical judgement Functional Limitation: Mobility: Walking and moving around Mobility: Walking and Moving Around Current Status ([id number]): At least 40 percent but less than 60 percent impaired, limited or restricted Mobility: Walking and Moving Around Goal Status [phone]): At least 40 percent but less than 60 percent impaired, limited or restricted Mobility: Walking and Moving Around Discharge Status [phone]): At least 40 percent but less than 60 percent impaired, limited or restricted    Time: 0840-0855 PT Time Calculation (min) (ACUTE ONLY): 15 min   Charges:   PT Evaluation $Initial PT Evaluation Tier I: 1 Procedure     PT G Codes:   PT G-Codes **NOT FOR INPATIENT  CLASS** Functional Assessment Tool Used: clinical judgement Functional Limitation: Mobility: Walking and moving around Mobility: Walking and Moving Around Current Status ([id number]): At least 40 percent but less than 60 percent impaired, limited or restricted Mobility: Walking and Moving Around Goal Status [phone]): At least 40 percent but less than 60 percent impaired, limited or restricted Mobility: Walking and Moving Around Discharge Status [phone]): At least 40 percent but less than 60 percent impaired, limited or restricted    Jerline Linzy 09/02/2015, 9:06 AM Pager [phone]

## 2015-09-02 NOTE — Discharge Summary (Addendum)
Barbara Flowers, is a 64 y.o. female  DOB 07-08-1951  MRN [id number].  Admission date:  08/31/2015  Admitting Physician  Elmarie Shiley, MD  Discharge Date:  09/02/2015   Primary MD  [name], MD  Recommendations for primary care physician for things to follow:   Monitor secondary risk factors for CVA , outpatient anemia workup.   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 Anemia   . Seizures ([place])   . Cancer ([place]) 1990    L breast  . Brain tumor ([place])   . Stroke [facility]) 2005    hemorrhagic  . Blood transfusion without reported diagnosis     Past Surgical History  Procedure Laterality Date  . Lymph node biopsy    . Hand surgery Left 2015  . Gastric bypass  1985  . Cholecystectomy    . Craniotomy for hemispherectomy total / partial Right   . Breast lumpectomy Left     post chemotherapy and radiation  . Hand tendon surgery Left     for brachial plexus injury       HPI  from the history and physical done on the day of admission:   This is a 64 year old female patient with significant past medical history of sleep apnea on CPAP, hypothyroidism, dementia, orthostatic hypotension on midodrine, overactive bladder,  peripheral neuropathy, avascular necrosis of the right hip, newly diagnosed LV aneurysm on Plavix, old CVA with residual left-sided weakness, prior tendon surgery left hand urinary to chronic brachial plexus injury, history of breast cancer status post lumpectomy chemotherapy and radiation in the 1990s, history of prior gastric bypass, history of chronic anemia as well as partial right parietal craniotomy in setting of metastatic brain tumor in 2010. Patient has recently moved to [place] from [place] and has just established with a primary care physician in [place] yesterday. Presented to the ER due to abrupt onset of inability to move the left side with no other associated neurological symptoms such as aphasia, tingling, facial drooping. Family at bedside assisting with history reports symptomatology lasted only about 10-15 minutes then began to slowly resolved. Patient was significantly disoriented during the episode. Because of the symptoms she was brought to the emergency department for further evaluation.   In the ER the patient was afebrile, hemodynamically stable with a BP of 10/03/1962, pulse is regular at 68 she was maintaining sinus rhythm, respirations  17 and room air saturations 90%. CT of the head without contrast revealed no acute abnormalities. Because of presentation a code stroke was initially called when patient arrived. She was evaluated by neurology who canceled code stroke and felt the patient was having TIA symptoms and recommended obtaining a CT angiogram of the head and neck. Laboratory data unremarkable except for mild hyperglycemia glucose 105. Troponin was normal. Coags were normal. Alcohol level was less than 5. EKG revealed sinus rhythm with prolonged QTC 525 ms but no acute ischemic changes.     Hospital Course:     1. TIA with left-sided weakness and dysarthria. Neurology following, MRI brain and CTA head and neck nonacute. However symptoms very suspicious for large  vessel TIA. She was seen by neurology and placed on Plavix and Lipitor based on her workup. Further stroke workup which will included echogram, evaluation by PT-OT-speech all completed, D/W with neurologist Dr. Leonie Man cleared for discharge on Plavix and Lipitor. She has currently qualified for home health PT per both PT & OT evaluation which has been done, since she has TIA her admit status is Obs as well, family interested in placement will request social work to talk to the family about it. Note she has Hx of Brain aneurysm bleed, ischemic stroke & brain malignancy.   I Called the following   Relation Home Work Mobile    Amberty,Timothy Spouse [phone]  No reply   Cox,Erica Daughter [phone]  Left message - 2.40pm   Cox,Richard Son [phone]  Left message - 2.44pm   Again updated husband in the room at 4.35pm, answered all his questions, Case Manager asked to talk to him in the room too.     2. Chronic orthostatic hypotension. Continue midodrine as tolerated.   3. Highly prolonged QTC upon admission. For now Pamelor has been discontinued, resolved on repeat EKG.   4. Hypothyroidism. On home dose Synthroid.   5. Underlying depression. Continue Lexapro.   6. OSA - CPAP QHS   7. History of left ventricular aneurysm. On anti-platelet medications for the same.   8. History of anemia. Outpatient age-appropriate workup per PCP.   9. Underlying dementia. Continue home medications. At risk for delirium.   10. Dyslipidemia. Placed on statin.      Discharge Condition: Fair  Follow UP  Follow-up Information    Follow up with HAGUE, Rosalyn Charters, MD. Schedule an appointment as soon as possible for a visit in 1 week.   Specialty:  Internal Medicine   Contact information:   [address] [place] [address] [phone]       Follow up with [facility]. Schedule an appointment as soon as possible for a visit in 1 week.   Why:  TIA,  Stroke Clinic, Office will call you with appointment date & time   Contact information:   [address]     [place] [phone]      Follow up with Curt Jews, MD. Schedule an appointment as soon as possible for a visit in 1 week.   Specialties:  Vascular Surgery, Cardiology   Why:  L subclavian stenosis   Contact information:   [address] [place] [address] [phone]        Consults obtained - Neuro  Diet and Activity recommendation: See Discharge Instructions below  Discharge Instructions          Discharge Instructions    Ambulatory referral to Neurology    Complete  by:  As directed   Please schedule post stroke follow up in 2 months.     Diet - low sodium heart healthy    Complete by:  As directed      Discharge instructions    Complete by:  As directed   Follow with Primary MD HAGUE, Rosalyn Charters, MD in 7 days   Get CBC, CMP, 2 view Chest X ray checked  by Primary MD next visit.    Activity: As tolerated with Full fall precautions use walker/cane & assistance as needed   Disposition HHPT/SNF if qualifies   Diet:   Heart Healthy  with feeding assistance and aspiration precautions.  For Heart failure patients - Check your Weight same time everyday, if you gain over 2 pounds, or you develop in leg swelling, experience more shortness of breath or chest pain, call your Primary MD immediately. Follow Cardiac Low Salt Diet and 1.5 lit/day fluid restriction.   On your next visit with your primary care physician please Get Medicines reviewed and adjusted.   Please request your Prim.MD to go over all Hospital Tests and Procedure/Radiological results at the follow up, please get all Hospital records sent to your Prim MD by signing hospital release before you go home.   If you experience worsening of your admission symptoms, develop shortness of breath, life threatening emergency, suicidal or homicidal thoughts you must seek  medical attention immediately by calling 911 or calling your MD immediately  if symptoms less severe.  You Must read complete instructions/literature along with all the possible adverse reactions/side effects for all the Medicines you take and that have been prescribed to you. Take any new Medicines after you have completely understood and accpet all the possible adverse reactions/side effects.   Do not drive, operating heavy machinery, perform activities at heights, swimming or participation in water activities or provide baby sitting services if your were admitted for syncope or siezures until you have seen by Primary MD or a Neurologist and advised to do so again.  Do not drive when taking Pain medications.    Do not take more than prescribed Pain, Sleep and Anxiety Medications  Special Instructions: If you have smoked or chewed Tobacco  in the last 2 yrs please stop smoking, stop any regular Alcohol  and or any Recreational drug use.  Wear Seat belts while driving.   Please note  You were cared for by a hospitalist during your hospital stay. If you have any questions about your discharge medications or the care you received while you were in the hospital after you are discharged, you can call the unit and asked to speak with the hospitalist on call if the hospitalist that took care of you is not available. Once you are discharged, your primary care physician will handle any further medical issues. Please note that NO REFILLS for any discharge medications will be authorized once you are discharged, as it is imperative that you return to your primary care physician (or establish a relationship with a primary care physician if you do not have one) for your aftercare needs so that they can reassess your need for medications and monitor your lab values.     Increase activity slowly    Complete by:  As directed              Discharge Medications       Medication List    STOP taking  these medications        nortriptyline 25  MG capsule  Commonly known as:  PAMELOR      TAKE these medications        ALEVE 220 MG tablet  Generic drug:  naproxen sodium  Take 440 mg by mouth 2 (two) times daily as needed.     atorvastatin 40 MG tablet  Commonly known as:  LIPITOR  Take 1 tablet (40 mg total) by mouth daily at 6 PM.     clopidogrel 75 MG tablet  Commonly known as:  PLAVIX  Take 75 mg by mouth daily.     donepezil 10 MG tablet  Commonly known as:  ARICEPT  Take 10 mg by mouth at bedtime.     escitalopram 20 MG tablet  Commonly known as:  LEXAPRO  Take 20 mg by mouth daily.     Iron 325 (65 FE) MG Tabs  Take 1 tablet by mouth daily.     levothyroxine 75 MCG tablet  Commonly known as:  SYNTHROID, LEVOTHROID  Take 75 mcg by mouth daily before breakfast.     memantine 10 MG tablet  Commonly known as:  NAMENDA  Take 10 mg by mouth daily.     midodrine 10 MG tablet  Commonly known as:  PROAMATINE  Take 10 mg by mouth 2 (two) times daily.     multivitamin with minerals tablet  Take 1 tablet by mouth daily.     MYRBETRIQ 50 MG Tb24 tablet  Generic drug:  mirabegron ER  Take 50 mg by mouth daily.     oxybutynin 5 MG tablet  Commonly known as:  DITROPAN  Take 10 mg by mouth 2 (two) times daily.     pantoprazole 40 MG tablet  Commonly known as:  PROTONIX  Take 40 mg by mouth daily.     pregabalin 75 MG capsule  Commonly known as:  LYRICA  Take 75 mg by mouth at bedtime.     vitamin B-12 1000 MCG tablet  Commonly known as:  CYANOCOBALAMIN  Take 1,000 mcg by mouth daily.        Major procedures and Radiology Reports - PLEASE review detailed and final reports for all details, in brief -    MRI brain, CTA head and neck nonacute.  TTE  Left ventricle: The cavity size was normal. Wall thickness wasnormal. Systolic function was normal. The estimated ejectionfraction was in the range of 55% to 60%. Wall motion was normal;there were no regional  wall motion abnormalities. Dopplerparameters are consistent with abnormal left ventricularrelaxation (grade 1 diastolic dysfunction).    Ct Angio Head W/cm &/or Wo Cm  08/31/2015  CLINICAL DATA:  64 year old female with sudden onset left side weakness this morning at breakfast. Initial code stroke activation, symptoms have improved. Personal history of treated metastatic breast cancer in the 1990s. Initial encounter. EXAM: CT ANGIOGRAPHY HEAD AND NECK TECHNIQUE: Multidetector CT imaging of the head and neck was performed using the standard protocol during bolus administration of intravenous contrast. Multiplanar CT image reconstructions and MIPs were obtained to evaluate the vascular anatomy. Carotid stenosis measurements (when applicable) are obtained utilizing NASCET criteria, using the distal internal carotid diameter as the denominator. CONTRAST:  57mL OMNIPAQUE IOHEXOL 350 MG/ML SOLN COMPARISON:  Head CT without contrast 1141 hours today. FINDINGS: CTA NECK Skeleton: Sequelae of right posterior craniotomy. No acute osseous abnormality identified. Degenerative changes in the spine. Visualized paranasal sinuses and mastoids are clear. Other neck: Postoperative changes to the left axilla and chest wall. Minimal anterior left upper lobe lung changes  probably sequelae of radiation. Otherwise negative visible lung parenchyma. No mediastinal lymphadenopathy. Diminutive thyroid. Larynx, pharynx, parapharyngeal spaces, retropharyngeal space, sublingual space, submandibular glands, and parotid glands are within normal limits. Postoperative changes to both globes, otherwise negative orbit and scalp soft tissues. Aortic arch: 3 vessel arch configuration. Calcified and soft plaque at the left subclavian and left CCA origins, but no hemodynamically significant stenosis at those sites. Right carotid system: Negative right CCA origin. Negative right CCA and right carotid bifurcation. Negative cervical right ICA. Left  carotid system: Atherosclerosis at the left CCA origin without significant stenosis. Soft plaque in the medial proximal left CCA at the thoracic inlet, less than 50 % stenosis with respect to the distal vessel. Soft and calcified plaque at the left ICA origin and posterior bulb with no stenosis. Tortuous distal cervical left ICA otherwise is negative. Vertebral arteries:Calcified plaque proximal right subclavian artery without stenosis. Normal right vertebral artery origin. Tortuous right V1 segment. Dominant right vertebral artery is normal to the skullbase. No proximal left subclavian artery stenosis despite plaque. However, there is continued soft and calcified plaque in the proximal left subclavian artery which is narrowed to about 3 mm diameter just upstream of the left vertebral artery origin (50-60%). There is also soft and calcified plaque at the left vertebral artery origin with moderate stenosis (series 405, image 95). The left vertebral artery is non dominant and is normal to the skullbase beyond its origin. CTA HEAD Posterior circulation: No distal vertebral artery stenosis, the right side is mildly dominant. Patent PICA origins. Patent vertebrobasilar junction. No basilar artery stenosis. Normal SCA origins (duplicated on the right. Fetal type bilateral PCA origins. Bilateral PCA branches are within normal limits. Anterior circulation: Both ICA siphons are patent. Mild calcified plaque on the left. No siphon stenosis. Ophthalmic and posterior communicating artery origins are normal. Normal carotid termini. Normal MCA and ACA origins. Anterior communicating artery and bilateral ACA branches are within normal limits. Left MCA M1 segment, bifurcation, and left MCA branches are within normal limits. Right MCA M1 segment, bifurcation, and right MCA branches are within normal limits. Venous sinuses: Patent. Anatomic variants: Fetal type bilateral PCA origins. Delayed phase: Mild encephalomalacia in the  posterior right hemisphere underlying the craniotomy site. Mild ex vacuo enlargement of the adjacent ventral. Chronic appearing left thalamic lacunar infarct. Small probable chronic left cerebellar lacunar infarct (series 501, image 8). Nonspecific punctate dystrophic calcification left pons. No definite acute cortically based infarct. No abnormal enhancement identified. No intracranial mass lesion. IMPRESSION: 1. Negative for emergent large vessel occlusion. No intracranial stenosis or circle Willis branch occlusion identified. 2. Proximal left subclavian artery atherosclerosis and 50-60% stenosis proximal to the left vertebral artery origin which demonstrates moderate stenosis. 3. Left carotid bifurcation atherosclerosis without stenosis. 4. Chronic postoperative changes to the left chest and right cerebral hemisphere. Superimposed chronic appearing small vessel ischemia in the left posterior circulation. No acute cortically based infarct identified. No intracranial metastasis identified. Electronically Signed   By: Genevie Ann M.D.   On: 08/31/2015 15:02   Dg Chest 2 View  08/31/2015  CLINICAL DATA:  Left-sided numbness. History of left breast carcinoma EXAM: CHEST  2 VIEW COMPARISON:  None. FINDINGS: There is no edema or consolidation. Heart size and pulmonary vascularity are normal. No adenopathy. There is postoperative change on the left with surgical clips in left axilla. There is extensive arthropathy in the left shoulder. There is anterior wedging of a lower thoracic vertebral body. Bones are osteoporotic. IMPRESSION: No  edema or consolidation. Postoperative change with clips in left axillary region. Bones osteoporotic. Advanced arthropathy left shoulder. Electronically Signed   By: Lowella Grip III M.D.   On: 08/31/2015 14:54   Ct Head Wo Contrast  08/31/2015  CLINICAL DATA:  Left side weakness starting this morning 11 a.m., history of previous hemorrhage EXAM: CT HEAD WITHOUT CONTRAST TECHNIQUE:  Contiguous axial images were obtained from the base of the skull through the vertex without intravenous contrast. COMPARISON:  None. FINDINGS: No skull fracture is noted. The patient is status post right parietal craniotomy. There is focal area of decreased attenuation in right posterior parietal lobe most likely postsurgical in nature or sequela from previous hemorrhage. Mild cerebral atrophy. Periventricular and patchy subcortical white matter decreased attenuation probable due to chronic small vessel ischemic changes. No definite acute cortical infarction. There is a lacunar infarct in left thalamus. No mass lesion is noted on this unenhanced scan. IMPRESSION: The patient is status post right parietal craniotomy. There is focal area of decreased attenuation in right posterior parietal lobe most likely postsurgical in nature or sequela from previous hemorrhage. Mild cerebral atrophy. Periventricular and patchy subcortical white matter decreased attenuation probable due to chronic small vessel ischemic changes. No definite acute cortical infarction. There is a lacunar infarct in left thalamus. These results were called by telephone at the time of interpretation on 08/31/2015 at 11:55 am to Dr. Leonel Ramsay, who verbally acknowledged these results. Electronically Signed   By: Lahoma Crocker M.D.   On: 08/31/2015 11:56   Ct Angio Neck W/cm &/or Wo/cm  08/31/2015  CLINICAL DATA:  64 year old female with sudden onset left side weakness this morning at breakfast. Initial code stroke activation, symptoms have improved. Personal history of treated metastatic breast cancer in the 1990s. Initial encounter. EXAM: CT ANGIOGRAPHY HEAD AND NECK TECHNIQUE: Multidetector CT imaging of the head and neck was performed using the standard protocol during bolus administration of intravenous contrast. Multiplanar CT image reconstructions and MIPs were obtained to evaluate the vascular anatomy. Carotid stenosis measurements (when  applicable) are obtained utilizing NASCET criteria, using the distal internal carotid diameter as the denominator. CONTRAST:  46mL OMNIPAQUE IOHEXOL 350 MG/ML SOLN COMPARISON:  Head CT without contrast 1141 hours today. FINDINGS: CTA NECK Skeleton: Sequelae of right posterior craniotomy. No acute osseous abnormality identified. Degenerative changes in the spine. Visualized paranasal sinuses and mastoids are clear. Other neck: Postoperative changes to the left axilla and chest wall. Minimal anterior left upper lobe lung changes probably sequelae of radiation. Otherwise negative visible lung parenchyma. No mediastinal lymphadenopathy. Diminutive thyroid. Larynx, pharynx, parapharyngeal spaces, retropharyngeal space, sublingual space, submandibular glands, and parotid glands are within normal limits. Postoperative changes to both globes, otherwise negative orbit and scalp soft tissues. Aortic arch: 3 vessel arch configuration. Calcified and soft plaque at the left subclavian and left CCA origins, but no hemodynamically significant stenosis at those sites. Right carotid system: Negative right CCA origin. Negative right CCA and right carotid bifurcation. Negative cervical right ICA. Left carotid system: Atherosclerosis at the left CCA origin without significant stenosis. Soft plaque in the medial proximal left CCA at the thoracic inlet, less than 50 % stenosis with respect to the distal vessel. Soft and calcified plaque at the left ICA origin and posterior bulb with no stenosis. Tortuous distal cervical left ICA otherwise is negative. Vertebral arteries:Calcified plaque proximal right subclavian artery without stenosis. Normal right vertebral artery origin. Tortuous right V1 segment. Dominant right vertebral artery is normal to the skullbase. No  proximal left subclavian artery stenosis despite plaque. However, there is continued soft and calcified plaque in the proximal left subclavian artery which is narrowed to about 3  mm diameter just upstream of the left vertebral artery origin (50-60%). There is also soft and calcified plaque at the left vertebral artery origin with moderate stenosis (series 405, image 95). The left vertebral artery is non dominant and is normal to the skullbase beyond its origin. CTA HEAD Posterior circulation: No distal vertebral artery stenosis, the right side is mildly dominant. Patent PICA origins. Patent vertebrobasilar junction. No basilar artery stenosis. Normal SCA origins (duplicated on the right. Fetal type bilateral PCA origins. Bilateral PCA branches are within normal limits. Anterior circulation: Both ICA siphons are patent. Mild calcified plaque on the left. No siphon stenosis. Ophthalmic and posterior communicating artery origins are normal. Normal carotid termini. Normal MCA and ACA origins. Anterior communicating artery and bilateral ACA branches are within normal limits. Left MCA M1 segment, bifurcation, and left MCA branches are within normal limits. Right MCA M1 segment, bifurcation, and right MCA branches are within normal limits. Venous sinuses: Patent. Anatomic variants: Fetal type bilateral PCA origins. Delayed phase: Mild encephalomalacia in the posterior right hemisphere underlying the craniotomy site. Mild ex vacuo enlargement of the adjacent ventral. Chronic appearing left thalamic lacunar infarct. Small probable chronic left cerebellar lacunar infarct (series 501, image 8). Nonspecific punctate dystrophic calcification left pons. No definite acute cortically based infarct. No abnormal enhancement identified. No intracranial mass lesion. IMPRESSION: 1. Negative for emergent large vessel occlusion. No intracranial stenosis or circle Willis branch occlusion identified. 2. Proximal left subclavian artery atherosclerosis and 50-60% stenosis proximal to the left vertebral artery origin which demonstrates moderate stenosis. 3. Left carotid bifurcation atherosclerosis without stenosis. 4.  Chronic postoperative changes to the left chest and right cerebral hemisphere. Superimposed chronic appearing small vessel ischemia in the left posterior circulation. No acute cortically based infarct identified. No intracranial metastasis identified. Electronically Signed   By: Genevie Ann M.D.   On: 08/31/2015 15:02   Mr Brain Wo Contrast  08/31/2015  CLINICAL DATA:  64 year old female with sudden onset left side weakness this morning at breakfast. Initial code stroke activation, symptoms have improved. Personal history of treated metastatic breast cancer in the 1990s. Initial encounter. EXAM: MRI HEAD WITHOUT CONTRAST TECHNIQUE: Multiplanar, multiecho pulse sequences of the brain and surrounding structures were obtained without intravenous contrast. COMPARISON:  CTA head and neck 1416 hours today. FINDINGS: Major intracranial vascular flow voids are preserved. No convincing restricted diffusion or evidence of acute infarction. Chronic lacunar infarct of the left thalamus. Small chronic lacunar infarcts in both cerebellar hemispheres, greater on the left. Multiple scattered chronic micro hemorrhages in both cerebral and cerebellar hemispheres. Patchy and confluent bilateral cerebral white matter T2 and FLAIR hyperintensity, which is also contiguous with encephalomalacia underlying 8 chronic right posterior craniotomy site. No midline shift, mass effect, evidence of mass lesion, ventriculomegaly, extra-axial collection or acute intracranial hemorrhage. Cervicomedullary junction and pituitary are within normal limits. Negative visualized cervical spine. Normal visible bone marrow signal. Paranasal sinuses are clear. Postoperative changes to both globes. Visible internal auditory structures appear normal. Trace retained secretions in the nasopharynx. No acute scalp soft tissue findings. IMPRESSION: 1. No convincing acute infarct or other acute intracranial abnormality. Moderately to severely advanced chronic small  vessel disease. 2. Postoperative changes to the posterior right hemisphere with mild encephalomalacia. Electronically Signed   By: Genevie Ann M.D.   On: 08/31/2015 16:22   Mr  Hip Right Wo Contrast  09/01/2015  CLINICAL DATA:  Right pelvic and leg pain. Fall. Known avascular necrosis. EXAM: MR OF THE RIGHT HIP WITHOUT CONTRAST TECHNIQUE: Multiplanar, multisequence MR imaging was performed. No intravenous contrast was administered. COMPARISON:  None. FINDINGS: The patient refused IV contrast. Bones: Severe bilateral degenerative hip arthropathy, much more striking on the right where there is florid osteophyte formation along the femoral head particularly anteriorly, degenerative cortical irregularity along the femoral head and acetabulum, degenerative subcortical cyst formation in the acetabulum. There is some remodeling of the acetabulum and femoral head due to the severity of the arthropathy, as well as marrow edema in the acetabulum and right femoral head tracking down into the femoral neck. On the left side the spurring is more notable posteriorly and the degenerative findings are not as severe is on the right. Slight irregularity of both sacroiliac joints indicating low-grade chronic arthropathy. Articular cartilage and labrum Articular cartilage: Severe full-thickness loss on the right side with no recognizable cartilage. Moderate to severe chondral loss on the left. Labrum: The spurring and remodeling of the acetabulum cause difficulty in assessing the right acetabular labrum. The labrum does seem blunted and is highly likely be torn. Joint or bursal effusion Joint effusion: Moderate right and small to moderate left hip joint effusions. Bursae:  No regional bursitis. Muscles and tendons Muscles and tendons: Generalized but symmetric atrophy. Hamstring tendons unremarkable. Other findings Miscellaneous:  No supplemental non-categorized findings. IMPRESSION: 1. Markedly severe degenerative arthropathy of the  right hip with full-thickness loss of articular cartilage, florid spurring and remodeling of the femoral head and acetabulum, extensive degenerative subcortical cyst formation, and subcortical edema. Moderate right hip joint effusion. 2. Considerable but less striking degenerative findings in the left hip, with less florid spurring and a small to moderate left hip joint effusion. Electronically Signed   By: Van Clines M.D.   On: 09/01/2015 16:11    Micro Results      Recent Results (from the past 240 hour(s))  Urine culture     Status: None   Collection Time: 08/31/15  6:34 PM  Result Value Ref Range Status   Specimen Description URINE, CLEAN CATCH  Final   Special Requests NONE  Final   Culture MULTIPLE SPECIES PRESENT, SUGGEST RECOLLECTION  Final   Report Status 09/02/2015 FINAL  Final    Today   Subjective    Barbara Flowers today has no headache,no chest abdominal pain,no new weakness tingling or numbness, feels much better wants to go home today.     Objective   Blood pressure 112/65, pulse 72, temperature 98.3 F (36.8 C), temperature source Oral, resp. rate 18, height 5\' 8"  (1.727 m), weight 86.592 kg (190 lb 14.4 oz), SpO2 100 %.   Intake/Output Summary (Last 24 hours) at 09/02/15 1344 Last data filed at 09/02/15 1200  Gross per 24 hour  Intake    720 ml  Output      0 ml  Net    720 ml    Exam Awake Alert, Oriented x 3, No new F.N deficits, Normal affect Adams.AT,PERRAL Supple Neck,No JVD, No cervical lymphadenopathy appriciated.  Symmetrical Chest wall movement, Good air movement bilaterally, CTAB RRR,No Gallops,Rubs or new Murmurs, No Parasternal Heave +ve B.Sounds, Abd Soft, Non tender, No organomegaly appriciated, No rebound -guarding or rigidity. No Cyanosis, Clubbing or edema, No new Rash or bruise, chronic L arm deformity   Data Review   CBC w Diff:  Lab Results  Component Value Date  WBC 5.6 08/31/2015   HGB 13.9 08/31/2015   HCT 41.0  08/31/2015   PLT 214 08/31/2015   LYMPHOPCT 28 08/31/2015   MONOPCT 9 08/31/2015   EOSPCT 3 08/31/2015   BASOPCT 1 08/31/2015    CMP:  Lab Results  Component Value Date   NA 142 08/31/2015   K 3.5 08/31/2015   CL 105 08/31/2015   CO2 21* 08/31/2015   BUN 20 08/31/2015   CREATININE 1.00 08/31/2015   PROT 6.5 08/31/2015   ALBUMIN 3.5 08/31/2015   BILITOT 0.7 08/31/2015   ALKPHOS 92 08/31/2015   AST 25 08/31/2015   ALT 17 08/31/2015  . Lab Results  Component Value Date   CHOL 170 09/01/2015   HDL 50 09/01/2015   LDLCALC 104* 09/01/2015   TRIG 82 09/01/2015   CHOLHDL 3.4 09/01/2015    Lab Results  Component Value Date   HGBA1C 5.7* 09/01/2015     Total Time in preparing paper work, data evaluation and todays exam - 35 minutes  Thurnell Lose M.D on 09/02/2015 at 1:44 PM  [facility]   Office  [phone]

## 2015-09-17 NOTE — Progress Notes (Signed)
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 Patient has recently moved to [place] from [place] and has just established with a primary care physician in [place].  Admitted with possible TIA, sudden onset left hemiparesis.  MRI negative for acute event.   Type of Study Bedside Swallow Evaluation  Diet Prior to this Study NPO  Temperature Spikes Noted No  Respiratory Status Room air  History of Recent Intubation No  Behavior/Cognition Alert;Cooperative;Pleasant mood  Oral Cavity Assessment WFL  Oral Care Completed by SLP No  Oral Cavity - Dentition Poor condition  Vision Functional for self-feeding  Self-Feeding Abilities Able to feed self;Needs assist  Patient Positioning Upright in bed  Baseline Vocal Quality Normal  Volitional Cough Strong  Volitional Swallow Able to elicit  Oral Assessment (Complete on admission/transfer/change in patient condition)  Does patient have any of the following "high risk" factors? None of the above  Oral Motor/Sensory Function  Overall Oral Motor/Sensory Function WFL  Ice Chips  Ice chips WFL  Thin Liquid  Thin Liquid WFL  Nectar Thick Liquid  Nectar Thick  Liquid NT  Honey Thick Liquid  Honey Thick Liquid NT  Puree  Puree WFL  Solid  Solid WFL  SLP - End of Session  Patient left in bed;with call bell/phone within reach  Nurse Communication Diet recommendation  SLP Assessment  Clinical Impression Statement (ACUTE ONLY) Pt presents with normal oropharyngeal swallow with adequate mastication, brisk swallow response, and no overt s/s of aspiration.  Recommend advancing to a regular diet with thin liquids; meds whole in puree.  No further SLP f/u is needed.  No speech-language evaluation necessary; will sign off.   Impact on safety and function No limitations  Swallow Evaluation Recommendations  SLP Diet Recommendations Thin;Age appropriate regular  Liquid Administration via Cup;Straw  Medication Administration Whole meds with liquid  Supervision Staff to assist with self feeding  Treatment Plan  Oral Care Recommendations Oral care BID  Individuals Consulted  Consulted and Agree with Results and Recommendations Patient  SLP Time Calculation  SLP Start Time (ACUTE ONLY) 1634  SLP Stop Time (ACUTE ONLY) 1651  SLP Time Calculation (min) (ACUTE ONLY) 17 min  SLP G-Codes **NOT FOR INPATIENT CLASS**  Functional Assessment Tool Used clinical judgment  Functional Limitations Swallowing  Swallow Current Status [id number]) [place]  Swallow Goal Status [id number]) [facility]  Swallow Discharge Status [id number]) [place]  SLP Evaluations  $ SLP Speech Visit 1 Procedure  SLP Evaluations  $BSS Swallow 1 Procedure

## 2015-10-08 ENCOUNTER — Ambulatory Visit (INDEPENDENT_AMBULATORY_CARE_PROVIDER_SITE_OTHER): Payer: Medicare Other | Admitting: Neurology

## 2015-10-08 ENCOUNTER — Encounter: Payer: Self-pay | Admitting: Neurology

## 2015-10-08 VITALS — BP 92/59 | HR 66 | Resp 16 | Ht 68.0 in

## 2015-10-08 DIAGNOSIS — I6309 Cerebral infarction due to thrombosis of other precerebral artery: Secondary | ICD-10-CM | POA: Diagnosis not present

## 2015-10-08 DIAGNOSIS — F0150 Vascular dementia without behavioral disturbance: Secondary | ICD-10-CM | POA: Diagnosis not present

## 2015-10-08 DIAGNOSIS — G4733 Obstructive sleep apnea (adult) (pediatric): Secondary | ICD-10-CM | POA: Diagnosis not present

## 2015-10-08 DIAGNOSIS — Z9989 Dependence on other enabling machines and devices: Principal | ICD-10-CM

## 2015-10-08 NOTE — Progress Notes (Signed)
SLEEP MEDICINE CLINIC   Provider:  Larey Flowers, M Barbara  Referring Provider: Raelyn Number, MD Primary Care Physician:  Barbara Nasuti, MD  Chief Complaint  Patient presents with  . New Patient (Initial Visit)    sleep apnea    HPI:  Barbara Flowers is a 65 y.o. female , seen here as a referral  from Dr. Jannette Flowers , PCP in [place], [place].  The patient is seen today as a new patient to my office but had been admitted to the [facility] system on 08/31/2015 with stroke symptoms. I incorporated her stroke workup into today's note. Barbara Flowers has been using CPAP after being diagnosed with obstructive sleep apnea during her residence in [place]. She moved last year here from [place] to [place] to be closer to the daughter. The daughter works as a Designer, jewellery in Dr. Jannette Flowers office. She has been using CPAP for 7 years , starting in [place], [place] . The patient has not had new supplies for her current CPAP for the last 9 month about. She is using a [employer] system 1,  this was the first REM star autotitrator. She is using a nose covering interface.  Chief complaint according to patient : vascular dementia, EDS,  gait ataxia, dizziness, falls , falling backwards. Has nocturia times 3. Needs new CPAP supplies.   Sleep habits are as follows: The patient goes to bed around 10:11 PM and usually falls asleep promptly, she often has dosed off if she watch TV during the day.  She sleeps on only one pillow,  The bedroom is cool, quiet and dark. However, her sleep will be fragmented by about 3 bathroom breaks each night. A small dog sleeps at the foot end of the marital bed.   A 1:30 AM bathroom break is usually followed by one at 3 in the morning between 5 and 6 AM. She is hydrating well and therefore has to go to the bathroom more often. She relies on her husband's help to go to the bathroom she is using a wheelchair at home as well as today during this office visit. She usually wakes  up around 9 AM spontaneously, has an alarm in the background. No nausea and no headaches in AM, no history of clusters. She has a dry mouth in AM. The patient usually wakens refreshed and restored but gets more fatigued again as the day goes on. This was a consult for sleep apnea and sleep study. Between 10 AM and 11 AM she gets visits from occupational therapy and physical therapy at home. This begun with her stroke.   She reports no vivid dreams, no sleep walking, talking, no sun downing.   The patient apnea list of her actual medications. She is on Lyrica, Midrin, Plavix, Lexapro, Mirapex trig, Namenda, Aricept, vitamin B12, nortriptyline, L-thyroxine, multivitamin, oxybutynin, Protonix, iron supplement.     Stroke consult Barbara Flowers - HISTORY Barbara Flowers is a 65 y.o. 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 She was in her normal state of health this morning until around 10:30 AM at which point she had sudden onset left arm and leg weakness. She slumped over to the left side and her husband asked why she didn't just use her left arm to push her  self backup. The patient was unaware that she was weak on the left side and the husband realized that she was completely flaccid on that side and therefore 911 was called.  On EMS arrival, she had some strength returning to her left arm but was still completely flaccid in her left leg in a code stroke was activated. En route to the hospital, she had rapid improvement in her symptoms and by the arrival of the ambulance, she was essentially back to baseline.  There is no evidence of seizure or loss of consciousness during episode.  LKW: 08/31/2015 at10:30 AM  tpa given?: no, symptoms resolved   SUBJECTIVE  (INTERVAL HISTORY) Her husband is at the bedside. He is wanting to get her into a rehab center. He mentioned that her daughter is a NP.   OBJECTIVE Temp:  [97.9 F (36.6 C)-98.4 F (36.9 C)] 98.3 F (36.8 C) (12/12 1003) Pulse Rate:  [65-84] 72 (12/12 1003) Cardiac Rhythm:  [-] Normal sinus rhythm;Bundle branch block (12/12 0700) Resp:  [18] 18 (12/12 1003) BP: (107-139)/(51-65) 112/65 mmHg (12/12 1003) SpO2:  [95 %-100 %] 100 % (12/12 1003)  CBC:   Recent Labs Lab 08/31/15 1135 08/31/15 1142  WBC 5.6  --   NEUTROABS 3.3  --   HGB 12.4 13.9  HCT 38.3 41.0  MCV 90.5  --   PLT 214  --     Basic Metabolic Panel:   Recent Labs Lab 08/31/15 1135 08/31/15 1142  NA 139 142  K 3.5 3.5  CL 109 105  CO2 21*  --   GLUCOSE 111* 105*  BUN 17 20  CREATININE 1.06* 1.00  CALCIUM 9.0  --     Lipid Panel:     Component Value Date/Time   CHOL 170 09/01/2015 0428   TRIG 82 09/01/2015 0428   HDL 50 09/01/2015 0428   CHOLHDL 3.4 09/01/2015 0428   VLDL 16 09/01/2015 0428   LDLCALC 104* 09/01/2015 0428   HgbA1c:  Lab Results  Component Value Date   HGBA1C 5.7* 09/01/2015   Urine Drug Screen:     Component Value Date/Time   LABOPIA NONE DETECTED 08/31/2015 1833   COCAINSCRNUR NONE DETECTED 08/31/2015 1833   LABBENZ NONE DETECTED 08/31/2015 1833   AMPHETMU NONE DETECTED 08/31/2015 1833   THCU NONE DETECTED 08/31/2015 1833   LABBARB NONE DETECTED 08/31/2015 1833      IMAGING  Ct Head Wo Contrast 08/31/2015   The patient is status post right parietal craniotomy. There is focal area of decreased attenuation in right posterior parietal lobe most likely postsurgical in nature or sequela from previous hemorrhage. Mild cerebral atrophy. Periventricular and patchy subcortical white matter decreased attenuation probable due to chronic small vessel ischemic changes. No definite acute cortical infarction. There is a lacunar infarct in left thalamus.   Ct Angio Head and Neck  W/cm &/or Wo Cm 08/31/2015   1. Negative for emergent large vessel occlusion. No intracranial stenosis or circle Willis branch occlusion identified.  2. Proximal left subclavian artery atherosclerosis and 50-60% stenosis proximal to the left vertebral artery origin which demonstrates moderate stenosis.  3. Left carotid bifurcation atherosclerosis without stenosis.  4. Chronic postoperative changes to the left chest and right cerebral hemisphere. Superimposed chronic appearing small vessel ischemia in the left posterior circulation. No acute cortically based infarct identified. No intracranial metastasis identified.   Mr Brain Wo Contrast 08/31/2015   1. No convincing acute infarct or other acute intracranial abnormality. Moderately to severely advanced chronic  small vessel disease.  2. Postoperative changes to the posterior right hemisphere with mild encephalomalacia.   Dg Chest 2 View 08/31/2015   No edema or consolidation. Postoperative change with clips in left axillary region. Bones osteoporotic.   2D Echocardiogram  - Left ventricle: The cavity size was normal. Wall thickness wasnormal. Systolic function was normal. The estimated ejectionfraction was in the range of 55% to 60%. Wall motion was normal;there were no regional wall motion abnormalities. Dopplerparameters are consistent with abnormal left ventricularrelaxation (grade 1 diastolic dysfunction).   Review of Systems: Out of a complete 14 system review, the patient complains of only the following symptoms, and all other reviewed systems are negative. Her husband reports that his wife snores when she does not use CPAP but that the CPAP completely treat this disorder. We were able in the office visit today to obtain a download. She is using in a flexed machine her average AHI is high at 9.6 apneas per hour, she has been very compliant 96.7% for the last 30 days including last night. Her average user time is 5 hours and 34  minutes. She has a very high time with air leaks per night which may be related to the age of her current equipment and supplies. Asher on average 6.9 cm she is a brief ramp time of 5 minutes starting at 4 cm water pressure with a maximum set at 12 cm water. She endorsed the fatigue severity questionnaire  at 54 points , the patient endorsed the Epworth sleepiness score at 12 points.  Epworth score 12 , Fatigue severity score 54 , depression score , geriatric 3 points.    Social History   Social History  . Marital Status: Married    Spouse Name: N/A  . Flowers of Children: 1  . Years of Education: N/A   Occupational History  . Not on file.   Social History Main Topics  . Smoking status: Never Smoker   . Smokeless tobacco: Never Used  . Alcohol Use: 0.0 oz/week    0 Standard drinks or equivalent per week     Comment: socially  . Drug Use: Not on file  . Sexual Activity: Not on file   Other Topics Concern  . Not on file   Social History Narrative   Patient lives at home with her family.   Caffeine Use: Yes    Family History  Problem Relation Age of Onset  . Hypertension Mother   . Stroke Mother   . Heart disease Father   . Hypertension Father     Past Medical History  Diagnosis Date  . Anemia   . Seizures ([place])   . Cancer ([place]) 1990    L breast  . Brain tumor ([place])   . Stroke [facility]) 2005    hemorrhagic  . Blood transfusion without reported diagnosis   . Breast cancer ([place])   . H/O bone marrow transplant ([place])   . Brain cancer [facility])     s/p sterotactic radio surgery  . Stroke ([place])   . Seizure ([place])   . Subdural hematoma ([place])   . Orthostatic hypotension   . Hypothyroidism   . Urinary incontinence   . Memory loss   . Upper brachial plexus paralysis syndrome   . Anxiety disorder   . MDD (major depressive disorder) ([place])   . Obesity   . IBS (irritable bowel syndrome)   . Anemia     Past Surgical History  Procedure Laterality Date  . Lymph  node biopsy     . Hand surgery Left 2015  . Gastric bypass  1985  . Cholecystectomy    . Craniotomy for hemispherectomy total / partial Right   . Breast lumpectomy Left     post chemotherapy and radiation  . Hand tendon surgery Left     for brachial plexus injury    Current Outpatient Prescriptions  Medication Sig Dispense Refill  . clopidogrel (PLAVIX) 75 MG tablet Take 75 mg by mouth daily.    . [employer] MELATONIN EXTRA STRENGTH 5 MG TABS Take 1 tablet by mouth daily.  2  . donepezil (ARICEPT) 10 MG tablet Take 10 mg by mouth at bedtime.    Marland Kitchen escitalopram (LEXAPRO) 20 MG tablet Take 20 mg by mouth daily.    . Ferrous Sulfate (IRON) 325 (65 FE) MG TABS Take 1 tablet by mouth daily.    Marland Kitchen HYDROcodone-acetaminophen (NORCO/VICODIN) 5-325 MG tablet Take 1 tablet by mouth every 12 (twelve) hours as needed. for pain  0  . levothyroxine (SYNTHROID, LEVOTHROID) 75 MCG tablet Take 75 mcg by mouth daily before breakfast.    . Linaclotide (LINZESS) 145 MCG CAPS capsule Take 145 mcg by mouth daily.    . memantine (NAMENDA) 10 MG tablet Take 10 mg by mouth daily.    . midodrine (PROAMATINE) 10 MG tablet Take 10 mg by mouth 2 (two) times daily.    . mirabegron ER (MYRBETRIQ) 50 MG TB24 tablet Take 50 mg by mouth daily.    . Multiple Vitamins-Minerals (MULTIVITAMIN WITH MINERALS) tablet Take 1 tablet by mouth daily.    . nortriptyline (PAMELOR) 25 MG capsule Take 25 mg by mouth 2 (two) times daily.    Marland Kitchen oxybutynin (DITROPAN) 5 MG tablet Take 10 mg by mouth 3 (three) times daily.     . pantoprazole (PROTONIX) 40 MG tablet Take 40 mg by mouth daily.    . pregabalin (LYRICA) 75 MG capsule Take 75 mg by mouth 2 (two) times daily.     . Probiotic Product (PROBIOTIC PO) Take by mouth.    . vitamin B-12 (CYANOCOBALAMIN) 1000 MCG tablet Take 1,000 mcg by mouth daily.    . naproxen sodium (ALEVE) 220 MG tablet Take 440 mg by mouth 2 (two) times daily as needed.     No current facility-administered medications for this visit.      Allergies as of 10/08/2015 - Review Complete 10/08/2015  Allergen Reaction Noted  . Gabapentin  10/03/2015  . Tramadol  08/31/2015    Vitals: BP 92/59 mmHg  Pulse 66  Resp 16  Ht 5\' 8"  (1.727 m)  Wt  Last Weight:  Wt Readings from Last 1 Encounters:  08/31/15 190 lb 14.4 oz (86.592 kg)   [id number] is no weight on file to calculate BMI.     Last Height:   Ht Readings from Last 1 Encounters:  10/08/15 5\' 8"  (1.727 m)    Physical exam:  General: The patient is awake, alert and appears not in acute distress. The patient is well groomed. Head: Normocephalic, atraumatic. Neck is supple. Mallampati 2,  neck circumference:14.5 . Nasal airflow unrestricted , TMJ is evident . Retrognathia is not seen.  Cardiovascular:  Regular rate and rhythm , without  murmurs or carotid bruit, and without distended neck veins. Respiratory: Lungs are clear to auscultation. Skin:  Without evidence of edema, or rash Trunk: BMI is  180 pounds at 5.8 . wheelchair bound.   Neurologic exam : The patient is awake and alert,  oriented to place and time.   Memory subjective described as intact.  Attention span & concentration ability appears normal.  Speech is fluent,  without dysarthria, dysphonia or aphasia.  Mood and affect are appropriate.  Cranial nerves: Pupils are equal and briskly reactive to light.   Extraocular movements  in vertical and horizontal planes intact with coarse saccades. Visual fields by finger perimetry are intact. Hearing to finger rub intact.   Facial sensation intact to fine touch.  Facial motor strength is symmetric and tongue and uvula move midline. Shoulder shrug was symmetrical.   Motor exam:   The patient's left hand shows contractions of the 4 fingers except the thumb she is able to fully extend her index finger but not ring middle or a small finger. She had undergone carpal tunnel surgery in the remote past. Her left hand also is described as numb and unable to perform  a grip strengths maneuver today. She has good strength however in her right and dominant hand.  Sensory:  Fine touch, pinprick and vibration were tested in all extremities.  Left hand  Numb, left arm numb Proprioception was deferred.  Coordination: Rapid alternating movements in the right  fingers/hand was normal. Finger-to-nose maneuver on the right normal without evidence of ataxia, dysmetria or tremor.  Gait and station: Patient in wheel chair.  Deep tendon reflexes: in the upper and lower extremities are symmetric and intact.  Babinski maneuver response is downgoing.  The patient was advised of the nature of the diagnosed sleep disorder , the treatment options and risks for general a health and wellness arising from not treating the condition.  I spent more than 60  minutes of face to face time with the patient. Greater than 50% of time was spent in counseling and coordination of care. We have discussed the diagnosis and differential and I answered the patient's questions.     Assessment:  After physical and neurologic examination, review of laboratory studies,  Personal review of imaging studies, reports of other /same  Imaging studies ,  Results of polysomnography/ neurophysiology testing and pre-existing records as far as provided in visit., my assessment is   1) Barbara Flowers has moved from out of state to [place] and has now entered Medicare coverage. She will need a new study to be allowed to continue using CPAP. In the meantime I would like for her to use her old machine but I will give her new supplies to bridge over for the period until the study can be performed. Given her history she seems to have had a moderate degree of sleep apnea in the past she does have an elevated body mass index but her main risk factor is her stroke and cerebrovascular health history. It is not visible today that she has any tongue or uvula deviation but she may well have a different muscle tone for her  respiratory system she reports no dysphonia and has no dysphonia. My goal is to obtain an in lab attended sleep study her husband will stay with her to be of assistance. I would like to study to be performed as soon as possible. In the meantime I will order supplies including a nasal Eson mask in medium size  2) Stroke patients have a higher risk of OSA and Mrs. Brotz suffered her main CVA in 2005 and developed seizures. She had multiple TIAs since.   Wheelchair patient needs special attention.  Husband will stay for the night. Eggcrate cover for mattress, please.  Incontinence pads , please.   Plan:  Treatment plan and additional workup :   Rv after SPLIt study.   Asencion Partridge Shonice Wrisley MD  10/08/2015   CC: Barbara Number, Md [address] [place], [place] [address]

## 2015-10-08 NOTE — Patient Instructions (Signed)

## 2015-10-17 DIAGNOSIS — Z993 Dependence on wheelchair: Secondary | ICD-10-CM

## 2015-10-24 ENCOUNTER — Ambulatory Visit (INDEPENDENT_AMBULATORY_CARE_PROVIDER_SITE_OTHER): Payer: Medicare Other | Admitting: Neurology

## 2015-10-24 DIAGNOSIS — F0150 Vascular dementia without behavioral disturbance: Secondary | ICD-10-CM

## 2015-10-24 DIAGNOSIS — I6309 Cerebral infarction due to thrombosis of other precerebral artery: Secondary | ICD-10-CM

## 2015-10-24 DIAGNOSIS — G4733 Obstructive sleep apnea (adult) (pediatric): Secondary | ICD-10-CM | POA: Diagnosis not present

## 2015-10-24 DIAGNOSIS — Z9989 Dependence on other enabling machines and devices: Principal | ICD-10-CM

## 2015-10-25 NOTE — Sleep Study (Signed)
Please see the scanned sleep study interpretation located in the procedure tab within the chart review section.   

## 2015-10-31 ENCOUNTER — Telehealth: Payer: Self-pay | Admitting: Neurology

## 2015-10-31 NOTE — Telephone Encounter (Signed)
Husband returned Barbara Flowers's call, advised husband, it takes on average 10-14 days to get results back from sleep study, Cyril Mourning will call when they have those results.

## 2015-10-31 NOTE — Telephone Encounter (Signed)
Pt just had sleep study on 10/24/15. It takes Dr. Brett Fairy on average 10-14 days to get the results back. Please advise pt if they call back that I will call them as soon as I have results.  I called pt's husband, no answer, left a message asking him to call us back.

## 2015-10-31 NOTE — Telephone Encounter (Signed)
Pt's husband called inquiring about results from sleep study.

## 2015-11-04 NOTE — Telephone Encounter (Signed)
Called pt to discuss sleep study results. No answer, left a message asking her to call me back. 

## 2015-11-06 NOTE — Telephone Encounter (Signed)
Called again to discuss sleep study results. No answer, left a message asking pt or husband to call me back.

## 2015-11-07 ENCOUNTER — Telehealth: Payer: Self-pay

## 2015-11-07 NOTE — Telephone Encounter (Signed)
I spoke to pt's husband regarding pt's sleep study results. I advised him that the sleep study did not reveal significant sleep apnea or PLMS of sleep resulting in significant sleep disruption. I advised him that the pt should avoid sedative-hypnotics with may worsen sleep apnea, alcohol and tobacco. I advised pt's husband that a follow up appt with Dr. Jannette Fogo will be scheduled. Pt's husband asked if pt should continue cpap. I advised him that the should make an appt with Dr. Brett Fairy to discuss that, since pt has been on cpap for so long and has a history of strokes. Pt's husband asked for the soonest possible appt. An appt was made for 11/14/15 at 1:00. Pt's husband verbalized understanding.

## 2015-11-14 ENCOUNTER — Ambulatory Visit: Payer: Self-pay | Admitting: Neurology

## 2015-12-12 ENCOUNTER — Ambulatory Visit: Payer: Medicare Other | Admitting: Neurology

## 2015-12-16 ENCOUNTER — Ambulatory Visit: Payer: Medicare Other | Admitting: Neurology

## 2016-01-10 DIAGNOSIS — R5381 Other malaise: Secondary | ICD-10-CM | POA: Diagnosis present

## 2016-01-10 DIAGNOSIS — G8929 Other chronic pain: Secondary | ICD-10-CM | POA: Insufficient documentation

## 2016-01-16 ENCOUNTER — Ambulatory Visit: Payer: Medicare Other | Admitting: Neurology

## 2016-03-18 ENCOUNTER — Telehealth: Payer: Self-pay

## 2016-03-18 NOTE — Telephone Encounter (Signed)
Pt's husband came for an appt with Dr. Rexene Alberts today and had questions regarding pt's sleep study results. Dr. Rexene Alberts asked me to call the pt and get her scheduled again with Dr. Brett Fairy (pt cancelled last 2 appointments). I called and spoke with pt's husband and made an appt for 8/17 at 9:30. Pt's husband is asking for a letter with this appt date and time in it.

## 2016-05-07 ENCOUNTER — Ambulatory Visit: Payer: Self-pay | Admitting: Neurology

## 2016-06-09 DIAGNOSIS — K922 Gastrointestinal hemorrhage, unspecified: Secondary | ICD-10-CM | POA: Insufficient documentation

## 2016-06-11 DIAGNOSIS — G20 Parkinson's disease: Secondary | ICD-10-CM | POA: Diagnosis present

## 2016-06-11 DIAGNOSIS — G20A1 Parkinson's disease without dyskinesia, without mention of fluctuations: Secondary | ICD-10-CM | POA: Diagnosis present

## 2016-06-12 DIAGNOSIS — G903 Multi-system degeneration of the autonomic nervous system: Secondary | ICD-10-CM | POA: Diagnosis present

## 2016-06-12 DIAGNOSIS — E876 Hypokalemia: Secondary | ICD-10-CM | POA: Diagnosis present

## 2016-06-13 DIAGNOSIS — G959 Disease of spinal cord, unspecified: Secondary | ICD-10-CM | POA: Insufficient documentation

## 2016-11-23 DIAGNOSIS — R413 Other amnesia: Secondary | ICD-10-CM | POA: Diagnosis present

## 2016-11-23 DIAGNOSIS — R441 Visual hallucinations: Secondary | ICD-10-CM | POA: Insufficient documentation

## 2016-11-23 DIAGNOSIS — M87051 Idiopathic aseptic necrosis of right femur: Secondary | ICD-10-CM | POA: Insufficient documentation

## 2017-05-24 ENCOUNTER — Inpatient Hospital Stay (HOSPITAL_COMMUNITY)
Admission: EM | Admit: 2017-05-24 | Discharge: 2017-05-28 | DRG: 641 | Disposition: A | Discharge status: Skilled Nursing Facility | Payer: Medicare Other | Attending: Internal Medicine | Admitting: Internal Medicine

## 2017-05-24 ENCOUNTER — Encounter (HOSPITAL_COMMUNITY): Payer: Self-pay

## 2017-05-24 ENCOUNTER — Emergency Department (HOSPITAL_COMMUNITY): Payer: Medicare Other

## 2017-05-24 DIAGNOSIS — F0390 Unspecified dementia without behavioral disturbance: Secondary | ICD-10-CM | POA: Diagnosis present

## 2017-05-24 DIAGNOSIS — K580 Irritable bowel syndrome with diarrhea: Secondary | ICD-10-CM | POA: Diagnosis present

## 2017-05-24 DIAGNOSIS — E860 Dehydration: Secondary | ICD-10-CM | POA: Diagnosis present

## 2017-05-24 DIAGNOSIS — R197 Diarrhea, unspecified: Secondary | ICD-10-CM

## 2017-05-24 DIAGNOSIS — Z9481 Bone marrow transplant status: Secondary | ICD-10-CM

## 2017-05-24 DIAGNOSIS — E039 Hypothyroidism, unspecified: Secondary | ICD-10-CM | POA: Diagnosis present

## 2017-05-24 DIAGNOSIS — Z8249 Family history of ischemic heart disease and other diseases of the circulatory system: Secondary | ICD-10-CM

## 2017-05-24 DIAGNOSIS — Z888 Allergy status to other drugs, medicaments and biological substances status: Secondary | ICD-10-CM

## 2017-05-24 DIAGNOSIS — E876 Hypokalemia: Secondary | ICD-10-CM | POA: Diagnosis not present

## 2017-05-24 DIAGNOSIS — Z79899 Other long term (current) drug therapy: Secondary | ICD-10-CM

## 2017-05-24 DIAGNOSIS — I6930 Unspecified sequelae of cerebral infarction: Secondary | ICD-10-CM

## 2017-05-24 DIAGNOSIS — I69354 Hemiplegia and hemiparesis following cerebral infarction affecting left non-dominant side: Secondary | ICD-10-CM

## 2017-05-24 DIAGNOSIS — E669 Obesity, unspecified: Secondary | ICD-10-CM | POA: Diagnosis present

## 2017-05-24 DIAGNOSIS — R410 Disorientation, unspecified: Secondary | ICD-10-CM

## 2017-05-24 DIAGNOSIS — G20 Parkinson's disease: Secondary | ICD-10-CM | POA: Diagnosis present

## 2017-05-24 DIAGNOSIS — G4733 Obstructive sleep apnea (adult) (pediatric): Secondary | ICD-10-CM | POA: Diagnosis present

## 2017-05-24 DIAGNOSIS — R531 Weakness: Secondary | ICD-10-CM

## 2017-05-24 DIAGNOSIS — Z823 Family history of stroke: Secondary | ICD-10-CM

## 2017-05-24 DIAGNOSIS — Z7401 Bed confinement status: Secondary | ICD-10-CM

## 2017-05-24 DIAGNOSIS — I951 Orthostatic hypotension: Secondary | ICD-10-CM | POA: Diagnosis not present

## 2017-05-24 DIAGNOSIS — Z853 Personal history of malignant neoplasm of breast: Secondary | ICD-10-CM

## 2017-05-24 DIAGNOSIS — F0150 Vascular dementia without behavioral disturbance: Secondary | ICD-10-CM | POA: Diagnosis not present

## 2017-05-24 DIAGNOSIS — Z9884 Bariatric surgery status: Secondary | ICD-10-CM

## 2017-05-24 DIAGNOSIS — Z9889 Other specified postprocedural states: Secondary | ICD-10-CM

## 2017-05-24 DIAGNOSIS — Z9049 Acquired absence of other specified parts of digestive tract: Secondary | ICD-10-CM

## 2017-05-24 DIAGNOSIS — R1310 Dysphagia, unspecified: Secondary | ICD-10-CM | POA: Diagnosis present

## 2017-05-24 DIAGNOSIS — Z85841 Personal history of malignant neoplasm of brain: Secondary | ICD-10-CM

## 2017-05-24 LAB — COMPREHENSIVE METABOLIC PANEL
ALBUMIN: 3.6 g/dL (ref 3.5–5.0)
ALT: 10 U/L — ABNORMAL LOW (ref 14–54)
AST: 18 U/L (ref 15–41)
Alkaline Phosphatase: 114 U/L (ref 38–126)
Anion gap: 12 (ref 5–15)
BUN: 21 mg/dL — AB (ref 6–20)
CHLORIDE: 105 mmol/L (ref 101–111)
CO2: 28 mmol/L (ref 22–32)
Calcium: 8.7 mg/dL — ABNORMAL LOW (ref 8.9–10.3)
Creatinine, Ser: 0.93 mg/dL (ref 0.44–1.00)
GFR calc Af Amer: >60 mL/min (ref >60)
GFR calc non Af Amer: >60 mL/min (ref >60)
GLUCOSE: 85 mg/dL (ref 65–99)
POTASSIUM: 2.4 mmol/L — AB (ref 3.5–5.1)
Sodium: 145 mmol/L (ref 135–145)
Total Bilirubin: 0.8 mg/dL (ref 0.3–1.2)
Total Protein: 7.1 g/dL (ref 6.5–8.1)

## 2017-05-24 LAB — URINALYSIS, ROUTINE W REFLEX MICROSCOPIC
Bilirubin Urine: NEGATIVE
Glucose, UA: NEGATIVE mg/dL
Ketones, ur: 5 mg/dL — AB
NITRITE: NEGATIVE
PH: 5 (ref 5.0–8.0)
Protein, ur: 30 mg/dL — AB
Specific Gravity, Urine: 1.023 (ref 1.005–1.030)

## 2017-05-24 LAB — CBC WITH DIFFERENTIAL/PLATELET
Basophils Absolute: 0 10*3/uL (ref 0.0–0.1)
Basophils Relative: 0 %
Eosinophils Absolute: 0.1 10*3/uL (ref 0.0–0.7)
Eosinophils Relative: 1 %
HCT: 39.1 % (ref 36.0–46.0)
Hemoglobin: 12.8 g/dL (ref 12.0–15.0)
LYMPHS ABS: 1.6 10*3/uL (ref 0.7–4.0)
LYMPHS PCT: 15 %
MCH: 29.4 pg (ref 26.0–34.0)
MCHC: 32.7 g/dL (ref 30.0–36.0)
MCV: 89.7 fL (ref 78.0–100.0)
MONO ABS: 1 10*3/uL (ref 0.1–1.0)
MONOS PCT: 10 %
Neutro Abs: 7.8 10*3/uL — ABNORMAL HIGH (ref 1.7–7.7)
Neutrophils Relative %: 74 %
PLATELETS: 194 10*3/uL (ref 150–400)
RBC: 4.36 MIL/uL (ref 3.87–5.11)
RDW: 14.2 % (ref 11.5–15.5)
WBC: 10.5 10*3/uL (ref 4.0–10.5)

## 2017-05-24 LAB — LIPASE, BLOOD: Lipase: 34 U/L (ref 11–51)

## 2017-05-24 LAB — MAGNESIUM: MAGNESIUM: 1.5 mg/dL — AB (ref 1.7–2.4)

## 2017-05-24 LAB — CORTISOL: CORTISOL PLASMA: 19 ug/dL

## 2017-05-24 MED ORDER — MAGNESIUM SULFATE 2 GM/50ML IV SOLN
2.0000 g | Freq: Once | INTRAVENOUS | Status: AC
  Administered 2017-05-25: 2 g via INTRAVENOUS
  Filled 2017-05-24: qty 50

## 2017-05-24 MED ORDER — POTASSIUM CHLORIDE 10 MEQ/100ML IV SOLN
10.0000 meq | INTRAVENOUS | Status: AC
  Administered 2017-05-24 (×2): 10 meq via INTRAVENOUS
  Filled 2017-05-24 (×2): qty 100

## 2017-05-24 MED ORDER — ACETAMINOPHEN 325 MG PO TABS: 650.0000 mg | ORAL_TABLET | Freq: Four times a day (QID) | ORAL | Status: DC | PRN

## 2017-05-24 MED ORDER — MEMANTINE HCL 10 MG PO TABS
10.0000 mg | ORAL_TABLET | Freq: Two times a day (BID) | ORAL | Status: DC
  Administered 2017-05-24 – 2017-05-28 (×8): 10 mg via ORAL
  Filled 2017-05-24 (×8): qty 1

## 2017-05-24 MED ORDER — POTASSIUM CHLORIDE CRYS ER 20 MEQ PO TBCR
40.0000 meq | EXTENDED_RELEASE_TABLET | Freq: Once | ORAL | Status: AC
  Administered 2017-05-24: 40 meq via ORAL
  Filled 2017-05-24: qty 2

## 2017-05-24 MED ORDER — ADULT MULTIVITAMIN W/MINERALS CH
1.0000 | ORAL_TABLET | Freq: Every day | ORAL | Status: DC
  Administered 2017-05-25 – 2017-05-28 (×4): 1 via ORAL
  Filled 2017-05-24 (×4): qty 1

## 2017-05-24 MED ORDER — ACETAMINOPHEN 650 MG RE SUPP: 650.0000 mg | Freq: Four times a day (QID) | RECTAL | Status: DC | PRN

## 2017-05-24 MED ORDER — ONDANSETRON HCL 4 MG/2ML IJ SOLN: 4.0000 mg | Freq: Four times a day (QID) | INTRAMUSCULAR | Status: DC | PRN

## 2017-05-24 MED ORDER — SODIUM CHLORIDE 0.9 % IV SOLN
INTRAVENOUS | Status: DC
  Administered 2017-05-24 – 2017-05-26 (×4): via INTRAVENOUS

## 2017-05-24 MED ORDER — NORTRIPTYLINE HCL 25 MG PO CAPS
25.0000 mg | ORAL_CAPSULE | Freq: Two times a day (BID) | ORAL | Status: DC
  Administered 2017-05-24 – 2017-05-27 (×6): 25 mg via ORAL
  Filled 2017-05-24 (×7): qty 1

## 2017-05-24 MED ORDER — ENOXAPARIN SODIUM 40 MG/0.4ML ~~LOC~~ SOLN
40.0000 mg | SUBCUTANEOUS | Status: DC
  Administered 2017-05-24 – 2017-05-27 (×4): 40 mg via SUBCUTANEOUS
  Filled 2017-05-24 (×4): qty 0.4

## 2017-05-24 MED ORDER — CHLORHEXIDINE GLUCONATE 0.12 % MT SOLN
15.0000 mL | Freq: Two times a day (BID) | OROMUCOSAL | Status: DC
  Administered 2017-05-24 – 2017-05-28 (×7): 15 mL via OROMUCOSAL
  Filled 2017-05-24 (×7): qty 15

## 2017-05-24 MED ORDER — FLUDROCORTISONE ACETATE 0.1 MG PO TABS
0.1000 mg | ORAL_TABLET | Freq: Three times a day (TID) | ORAL | Status: DC
Start: 2017-05-24 — End: 2017-05-28
  Administered 2017-05-24 – 2017-05-28 (×12): 0.1 mg via ORAL
  Filled 2017-05-24 (×13): qty 1

## 2017-05-24 MED ORDER — MIRABEGRON ER 25 MG PO TB24
50.0000 mg | ORAL_TABLET | Freq: Every day | ORAL | Status: DC
  Administered 2017-05-24 – 2017-05-28 (×5): 50 mg via ORAL
  Filled 2017-05-24 (×5): qty 2

## 2017-05-24 MED ORDER — SODIUM CHLORIDE 0.9 % IV BOLUS (SEPSIS)
1000.0000 mL | Freq: Once | INTRAVENOUS | Status: AC
  Administered 2017-05-24: 1000 mL via INTRAVENOUS

## 2017-05-24 MED ORDER — DONEPEZIL HCL 5 MG PO TABS
10.0000 mg | ORAL_TABLET | Freq: Every day | ORAL | Status: DC
Start: 2017-05-24 — End: 2017-05-28
  Administered 2017-05-24 – 2017-05-27 (×4): 10 mg via ORAL
  Filled 2017-05-24 (×4): qty 2

## 2017-05-24 MED ORDER — ESCITALOPRAM OXALATE 10 MG PO TABS
20.0000 mg | ORAL_TABLET | Freq: Every day | ORAL | Status: DC
  Administered 2017-05-24 – 2017-05-28 (×5): 20 mg via ORAL
  Filled 2017-05-24 (×5): qty 2

## 2017-05-24 MED ORDER — PREGABALIN 50 MG PO CAPS
75.0000 mg | ORAL_CAPSULE | Freq: Two times a day (BID) | ORAL | Status: DC
  Administered 2017-05-24 – 2017-05-27 (×6): 75 mg via ORAL
  Filled 2017-05-24 (×6): qty 1

## 2017-05-24 MED ORDER — ONDANSETRON HCL 4 MG PO TABS: 4.0000 mg | ORAL_TABLET | Freq: Four times a day (QID) | ORAL | Status: DC | PRN

## 2017-05-24 MED ORDER — POTASSIUM CHLORIDE 10 MEQ/100ML IV SOLN
10.0000 meq | INTRAVENOUS | Status: DC
  Administered 2017-05-24 (×2): 10 meq via INTRAVENOUS
  Filled 2017-05-24 (×2): qty 100

## 2017-05-24 MED ORDER — ORAL CARE MOUTH RINSE
15.0000 mL | Freq: Two times a day (BID) | OROMUCOSAL | Status: DC
  Administered 2017-05-25 – 2017-05-28 (×6): 15 mL via OROMUCOSAL

## 2017-05-24 MED ORDER — MIDODRINE HCL 5 MG PO TABS
10.0000 mg | ORAL_TABLET | Freq: Three times a day (TID) | ORAL | Status: DC
  Administered 2017-05-25 – 2017-05-28 (×9): 10 mg via ORAL
  Filled 2017-05-24 (×12): qty 2

## 2017-05-24 MED ORDER — CLOPIDOGREL BISULFATE 75 MG PO TABS
75.0000 mg | ORAL_TABLET | Freq: Every day | ORAL | Status: DC
  Administered 2017-05-25 – 2017-05-28 (×4): 75 mg via ORAL
  Filled 2017-05-24 (×4): qty 1

## 2017-05-24 NOTE — Progress Notes (Addendum)
Checking Mg level.  I think the EKG is incorrectly reading the T wave location and miscalculating QT interval though.  Will also order Repeat EKG.  Patient already confused more than baseline, sundowning on top of chronic dementia.  I would prefer not to have to pull her off of a large quantity of her chronic home meds if we dont have to.

## 2017-05-24 NOTE — ED Triage Notes (Signed)
Per report:  Pt lives at home w/family.  C/o diarrhea x 1 week, lethargy/gen weakness x past 3 days.  Daughter is a PA and has sent her here.  Denies n/v.  Urine is not dark w/foul odor.  Hx LT breast & brain cancer, stroke.

## 2017-05-24 NOTE — ED Notes (Signed)
Attempted to draw I-stat by starting 2nd IV site.  Unable x 2 attempts.   Pt is more confused than she was earlier.  Family states she has hx sundowners.  Husband is at bedside and helping to keep pt calm.

## 2017-05-24 NOTE — H&P (Signed)
History and Physical    Barbara Flowers TWS:[id number] DOB: 10-22-1950 DOA: 05/24/2017  PCP: Raelyn Number, MD  Patient coming from: Home  I have personally briefly reviewed patient's old medical records in [place]  Chief Complaint: Diarrhea, generalized weakness  HPI: Barbara Flowers is a 66 y.o. female with medical history significant of prior stroke, BRCA and brain tumor in 90s.  Residual L sided weakness.  Dementia, parkinsonism.  Patient brought in to ED by family with diarrhea for past 1 week, lethargy / generalized weakness for past 3 days.  Daughter is a PA and sent patient here to ED.  ED Course: Work up in ED most significant for hypokalemia with K of 2.4.  Being given 4 runs IV K.  BPs have been very labile at home with BP earlier today as low as 70 systolic, but per daughter, patients BPs are typically labile.  Of note, running 001 to 749 systolic in ED.   Review of Systems: As per HPI otherwise 10 point review of systems negative.   Past Medical History:  Diagnosis Date  . Anemia   . Anemia   . Anxiety disorder   . Blood transfusion without reported diagnosis   . Brain cancer [facility])    s/p sterotactic radio surgery  . Brain tumor ([place])   . Breast cancer ([place])   . Cancer ([place]) 1990   L breast  . H/O bone marrow transplant ([place])   . Hypothyroidism   . IBS (irritable bowel syndrome)   . MDD (major depressive disorder)   . Memory loss   . Obesity   . Orthostatic hypotension   . Seizure ([place])   . Seizures ([place])   . Stroke [facility]) 2005   hemorrhagic  . Stroke ([place])   . Subdural hematoma ([place])   . Upper brachial plexus paralysis syndrome   . Urinary incontinence     Past Surgical History:  Procedure Laterality Date  . BREAST LUMPECTOMY Left    post chemotherapy and radiation  . CHOLECYSTECTOMY    . CRANIOTOMY FOR HEMISPHERECTOMY TOTAL / PARTIAL Right   . GASTRIC BYPASS  1985  . HAND SURGERY Left 2015  . HAND TENDON SURGERY Left    for brachial  plexus injury  . LYMPH NODE BIOPSY       reports that she has never smoked. She has never used smokeless tobacco. She reports that she drinks alcohol. Her drug history is not on file.  Allergies  Allergen Reactions  . Gabapentin Other (See Comments)    seizure  . Tramadol Other (See Comments)    Brings on Seizures     Family History  Problem Relation Age of Onset  . Hypertension Mother   . Stroke Mother   . Heart disease Father   . Hypertension Father      Prior to Admission medications   Medication Sig Start Date End Date Taking? Authorizing Provider  clopidogrel (PLAVIX) 75 MG tablet Take 75 mg by mouth daily.   Yes [provider]  donepezil (ARICEPT) 10 MG tablet Take 10 mg by mouth at bedtime.   Yes [provider]  escitalopram (LEXAPRO) 20 MG tablet Take 20 mg by mouth daily.   Yes [provider]  fludrocortisone (FLORINEF) 0.1 MG tablet Take 0.1 mg by mouth 3 (three) times daily.   Yes [provider]  loperamide (IMODIUM A-D) 2 MG tablet Take 4 mg by mouth 4 (four) times daily as needed for diarrhea or loose stools.  Yes [provider]  memantine (NAMENDA) 10 MG tablet Take 10 mg by mouth 2 (two) times daily.    Yes [provider]  midodrine (PROAMATINE) 10 MG tablet Take 10 mg by mouth 3 (three) times daily.    Yes [provider]  mirabegron ER (MYRBETRIQ) 50 MG TB24 tablet Take 50 mg by mouth daily.   Yes [provider]  Multiple Vitamins-Minerals (MULTIVITAMIN WITH MINERALS) tablet Take 1 tablet by mouth daily.   Yes [provider]  nortriptyline (PAMELOR) 25 MG capsule Take 25 mg by mouth 2 (two) times daily.   Yes [provider]  pregabalin (LYRICA) 75 MG capsule Take 75 mg by mouth 2 (two) times daily.    Yes [provider]    Physical Exam: Vitals:   05/24/17 1530 05/24/17 1741 05/24/17 1802 05/24/17 1931  BP: 118/86 123/80 (!) 141/71 (!) 141/71  Pulse:  68  70 72  Resp:  (!) 23 18 18   Temp:      TempSrc:   Oral Oral  SpO2: 98%  99%     Constitutional: NAD, calm, comfortable Eyes: PERRL, lids and conjunctivae normal ENMT: Mucous membranes are moist. Posterior pharynx clear of any exudate or lesions.Normal dentition.  Neck: normal, supple, no masses, no thyromegaly Respiratory: clear to auscultation bilaterally, no wheezing, no crackles. Normal respiratory effort. No accessory muscle use.  Cardiovascular: Regular rate and rhythm, no murmurs / rubs / gallops. No extremity edema. 2+ pedal pulses. No carotid bruits.  Abdomen: no tenderness, no masses palpated. No hepatosplenomegaly. Bowel sounds positive.  Musculoskeletal: no clubbing / cyanosis. No joint deformity upper and lower extremities. Good ROM, no contractures. Normal muscle tone.  Skin: no rashes, lesions, ulcers. No induration Neurologic: Awake but confused, MAE, chronic L arm weakness with contractures of hand.  Not following commands. Psychiatric: Normal judgment and insight. Alert and oriented x 3. Normal mood.    Labs on Admission: I have personally reviewed following labs and imaging studies  CBC:  Recent Labs Lab 05/24/17 1540  WBC 10.5  NEUTROABS 7.8*  HGB 12.8  HCT 39.1  MCV 89.7  PLT 169   Basic Metabolic Panel:  Recent Labs Lab 05/24/17 1540  NA 145  K 2.4*  CL 105  CO2 28  GLUCOSE 85  BUN 21*  CREATININE 0.93  CALCIUM 8.7*   GFR: CrCl cannot be calculated (Unknown ideal weight.). Liver Function Tests:  Recent Labs Lab 05/24/17 1540  AST 18  ALT 10*  ALKPHOS 114  BILITOT 0.8  PROT 7.1  ALBUMIN 3.6    Recent Labs Lab 05/24/17 1540  LIPASE 34   No results for input(s): AMMONIA in the last 168 hours. Coagulation Profile: No results for input(s): INR, PROTIME in the last 168 hours. Cardiac Enzymes: No results for input(s): CKTOTAL, CKMB, CKMBINDEX, TROPONINI in the last 168 hours. BNP (last 3 results) No results for input(s):  PROBNP in the last 8760 hours. HbA1C: No results for input(s): HGBA1C in the last 72 hours. CBG: No results for input(s): GLUCAP in the last 168 hours. Lipid Profile: No results for input(s): CHOL, HDL, LDLCALC, TRIG, CHOLHDL, LDLDIRECT in the last 72 hours. Thyroid Function Tests: No results for input(s): TSH, T4TOTAL, FREET4, T3FREE, THYROIDAB in the last 72 hours. Anemia Panel: No results for input(s): VITAMINB12, FOLATE, FERRITIN, TIBC, IRON, RETICCTPCT in the last 72 hours. Urine analysis:    Component Value Date/Time   COLORURINE YELLOW 05/24/2017 1754   APPEARANCEUR HAZY (A) 05/24/2017 1754  LABSPEC 1.023 05/24/2017 1754   PHURINE 5.0 05/24/2017 1754   GLUCOSEU NEGATIVE 05/24/2017 1754   HGBUR SMALL (A) 05/24/2017 1754   BILIRUBINUR NEGATIVE 05/24/2017 1754   KETONESUR 5 (A) 05/24/2017 1754   PROTEINUR 30 (A) 05/24/2017 1754   NITRITE NEGATIVE 05/24/2017 1754   LEUKOCYTESUR SMALL (A) 05/24/2017 1754    Radiological Exams on Admission: Dg Chest 2 View  Result Date: 05/24/2017 CLINICAL DATA:  Diarrhea for 1 week and generalized weakness for 3 days. EXAM: CHEST  2 VIEW COMPARISON:  June 09, 2016 FINDINGS: The heart size and mediastinal contours are stable. There is no focal infiltrate, pulmonary edema, or pleural effusion. Surgical clips are identified in the left axilla unchanged. Degenerative joint changes of bilateral shoulders are unchanged. Calcification of left breast is unchanged. IMPRESSION: No active cardiopulmonary disease. Electronically Signed   By: Abelardo Diesel M.D.   On: 05/24/2017 17:23   Ct Head Wo Contrast  Result Date: 05/24/2017 CLINICAL DATA:  Altered mental status. EXAM: CT HEAD WITHOUT CONTRAST TECHNIQUE: Contiguous axial images were obtained from the base of the skull through the vertex without intravenous contrast. COMPARISON:  Head CT dated 04/29/2016. FINDINGS: Brain: Interval mild patchy low density in the pons. Diffusely enlarged ventricles and  subarachnoid spaces. Patchy white matter low density in both cerebral hemispheres. Stable old left thalamic lacunar infarct. Stable encephalomalacia in calcifications underneath a right posterior parietal craniotomy defect. No intracranial hemorrhage, mass lesion or CT evidence of acute infarction. Vascular: No hyperdense vessel or unexpected calcification. Skull: Stable right parietal post craniotomy changes. Sinuses/Orbits: Status post bilateral cataract extraction and right lens implant. Unremarkable paranasal sinuses. Other: None. IMPRESSION: 1. No acute abnormality. 2. Stable moderate diffuse cerebral and cerebellar atrophy. 3. Stable extensive small vessel white matter ischemic changes in both cerebral hemispheres, old left thalamic lacunar infarct and right posterior watershed encephalomalacia and parenchymal calcifications beneath a craniotomy defect. Electronically Signed   By: Claudie Revering M.D.   On: 05/24/2017 17:44    EKG: Independently reviewed.  Assessment/Plan Principal Problem:   Hypokalemia Active Problems:   History of CVA with residual deficit left side   Dementia   Chronic orthostatic hypotension   Diarrhea    1. Hypokalemia - likely secondary to diarrhea 1. Replace K 1. 42mq IV 2. Adding 467m PO if she can take (if not will need to order additional runs IV). 2. Tele monitor 3. Repeat BMP in AM 2. Diarrhea - 1. C.Diff pending 2. If negative then resume imodium 3. No recent ABx courses 4. Hydrate with NS at 100 cc/hr 3. H/o CVA, dementia - 1. Continue home meds 2. No special diet per husband 4. Chronic orthostatic hypotension - 1. Continue home meds including midodrine and florinef 5. H/o OSA - hasnt used C.Pap in a while per husband  DVT prophylaxis: Lovenox Code Status: Full Family Communication: Husband at bedside Disposition Plan: Home after admit Consults called: None [place] [facility] [email or web address] Pager  [phone] 7AM-7PM, please contact day team taking care of patient [email or web address] Password [email or web address]  05/24/2017, 8:08 PM

## 2017-05-24 NOTE — ED Notes (Signed)
Bed: WA09 Expected date:  Expected time:  Means of arrival:  Comments: UTI/lethargic

## 2017-05-24 NOTE — ED Notes (Signed)
CRITICAL POTASSIUM  READ BACK AND VERIFIED WITH CATHLEEN COHEN  K = 2.4  Primary nurse notified, MD notified.

## 2017-05-24 NOTE — ED Notes (Signed)
Please call report to Joellen Jersey, RN at 8:45 at [phone]. Thank you!

## 2017-05-24 NOTE — ED Notes (Signed)
Family request hold on lab draw @ this time. EDP made aware.

## 2017-05-24 NOTE — ED Provider Notes (Signed)
[facility] DEPT Provider Note   CSN: [id number] Arrival date & time: 05/24/17  1446     History   Chief Complaint Chief Complaint  Patient presents with  . Weakness    HPI Barbara Flowers is a 66 y.o. female.  Patient is a 66 year old female who presents with weakness and diarrhea. She lives at home with family but is confined to bed and wheelchair. She has an extensive history including prior brain cancer and breast cancer that was treated in 90s. She also has chronic hypotension and is on midodrine and Florinef for this.she also has had prior strokes with some residual left upper extremity weakness and a prior subdural hematoma. She has a history of seizures.  Family states that she's had increasing weakness over the last 2-3 days with some watery loose stools over the last 2 days. No vomiting. She's had some increased confusion as compared to her baseline values. She was less her sponsor today. She normally can stand and pivot but she hasn't been able to do that today. She hasn't had any oral intake today and has had decreased oral intake over the last few days. No known fevers. No blood in her stool. No known cough. No complaints of pain. No evident shortness of breath. She was hypotensive earlier today with a blood pressure in the 70s although her daughter states that frequently her blood pressure goes up and down.      Past Medical History:  Diagnosis Date  . Anemia   . Anemia   . Anxiety disorder   . Blood transfusion without reported diagnosis   . Brain cancer [facility])    s/p sterotactic radio surgery  . Brain tumor ([place])   . Breast cancer ([place])   . Cancer ([place]) 1990   L breast  . H/O bone marrow transplant ([place])   . Hypothyroidism   . IBS (irritable bowel syndrome)   . MDD (major depressive disorder)   . Memory loss   . Obesity   . Orthostatic hypotension   . Seizure ([place])   . Seizures ([place])   . Stroke [facility]) 2005   hemorrhagic  . Stroke ([place])   . Subdural  hematoma ([place])   . Upper brachial plexus paralysis syndrome   . Urinary incontinence     Patient Active Problem List   Diagnosis Date Noted  . Diarrhea 05/24/2017  . Hypokalemia 05/24/2017  . TIA (transient ischemic attack) 08/31/2015  . History of CVA with residual deficit left side 08/31/2015  . Hypothyroidism 08/31/2015  . OSA on CPAP 08/31/2015  . Dementia 08/31/2015  . Chronic orthostatic hypotension 08/31/2015  . OAB (overactive bladder) 08/31/2015  . 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  . Peripheral neuropathy 08/31/2015  . 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 Left carotid bruit 08/31/2015  . Anemia 08/31/2015    Past Surgical History:  Procedure Laterality Date  . BREAST LUMPECTOMY Left    post chemotherapy and radiation  . CHOLECYSTECTOMY    . CRANIOTOMY FOR HEMISPHERECTOMY TOTAL / PARTIAL Right   . GASTRIC BYPASS  1985  . HAND SURGERY Left 2015  . HAND TENDON SURGERY Left    for brachial plexus injury  . LYMPH NODE BIOPSY      OB History    No data available       Home Medications    Prior to Admission medications   Medication Sig Start Date End Date Taking? Authorizing Provider  clopidogrel (PLAVIX) 75 MG tablet Take 75 mg  by mouth daily.   Yes [provider]  donepezil (ARICEPT) 10 MG tablet Take 10 mg by mouth at bedtime.   Yes [provider]  escitalopram (LEXAPRO) 20 MG tablet Take 20 mg by mouth daily.   Yes [provider]  fludrocortisone (FLORINEF) 0.1 MG tablet Take 0.1 mg by mouth 3 (three) times daily.   Yes [provider]  loperamide (IMODIUM A-D) 2 MG tablet Take 4 mg by mouth 4 (four) times daily as needed for diarrhea or loose stools.   Yes [provider]  memantine (NAMENDA) 10 MG tablet Take 10 mg by mouth 2 (two) times daily.    Yes [provider]  midodrine (PROAMATINE) 10 MG tablet Take 10 mg by mouth 3 (three) times daily.    Yes [provider]  mirabegron ER (MYRBETRIQ) 50 MG TB24 tablet Take 50 mg by mouth daily.   Yes [provider]  Multiple Vitamins-Minerals (MULTIVITAMIN WITH MINERALS) tablet Take 1 tablet by mouth daily.   Yes [provider]  nortriptyline (PAMELOR) 25 MG capsule Take 25 mg by mouth 2 (two) times daily.   Yes [provider]  pregabalin (LYRICA) 75 MG capsule Take 75 mg by mouth 2 (two) times daily.    Yes [provider]    Family History Family History  Problem Relation Age of Onset  . Hypertension Mother   . Stroke Mother   . Heart disease Father   . Hypertension Father     Social History Social History  Substance Use Topics  . Smoking status: Never Smoker  . Smokeless tobacco: Never Used  . Alcohol use 0.0 oz/week     Comment: socially     Allergies   Gabapentin and Tramadol   Review of Systems Review of Systems  Unable to perform ROS: Mental status change     Physical Exam Updated Vital Signs BP (!) 141/71 (BP Location: Right Arm)   Pulse 72   Temp 97.9 F (36.6 C)   Resp 18   SpO2 99%   Physical Exam  Constitutional: She appears well-developed. She has a sickly appearance.  HENT:  Head: Normocephalic and atraumatic.  Dry mucous membranes  Eyes: Pupils are equal, round, and reactive to light.  Neck: Normal range of motion. Neck supple.  Cardiovascular: Normal rate, regular rhythm and normal heart sounds.   Pulmonary/Chest: Effort normal and breath sounds normal. No respiratory distress. She has no wheezes. She has no rales. She exhibits no tenderness.  Abdominal: Soft. Bowel sounds are normal. There is no tenderness. There is no rebound and no guarding.  Musculoskeletal: Normal range of motion. She exhibits no edema.  Lymphadenopathy:    She has no cervical adenopathy.  Neurological: She is alert.  The patient is awake but confused. She knows that she is in a medical facility but doesn't know the month or date. She is moving all  extremities symmetrically other than she has some chronic weakness in her left arm with some contractures of her left hand. She withdraws from painful stimuli.No obvious facial droop. She's not following commands.  Skin: Skin is warm and dry. No rash noted.  Psychiatric: She has a normal mood and affect.     ED Treatments / Results  Labs (all labs ordered are listed, but only abnormal results are displayed) Labs Reviewed  COMPREHENSIVE METABOLIC PANEL - Abnormal; Notable for the following:       Result Value   Potassium 2.4 (*)  BUN 21 (*)    Calcium 8.7 (*)    ALT 10 (*)    All other components within normal limits  CBC WITH DIFFERENTIAL/PLATELET - Abnormal; Notable for the following:    Neutro Abs 7.8 (*)    All other components within normal limits  URINALYSIS, ROUTINE W REFLEX MICROSCOPIC - Abnormal; Notable for the following:    APPearance HAZY (*)    Hgb urine dipstick SMALL (*)    Ketones, ur 5 (*)    Protein, ur 30 (*)    Leukocytes, UA SMALL (*)    Bacteria, UA FEW (*)    Squamous Epithelial / LPF 6-30 (*)    All other components within normal limits  URINE CULTURE  STOOL CULTURE  C DIFFICILE QUICK SCREEN W PCR REFLEX  LIPASE, BLOOD  CORTISOL  BASIC METABOLIC PANEL  TSH  I-STAT CG4 LACTIC ACID, ED    EKG  EKG Interpretation  Date/Time:  Monday May 24 2017 16:50:20 EDT Ventricular Rate:  74 PR Interval:    QRS Duration: 113 QT Interval:  529 QTC Calculation: 587 R Axis:   3 Text Interpretation:  Sinus rhythm Borderline intraventricular conduction delay Low voltage, precordial leads Nonspecific T abnormalities, lateral leads Prolonged QT interval since last tracing no significant change Confirmed by Malvin Johns [phone]) on 05/24/2017 5:27:58 PM       Radiology Dg Chest 2 View  Result Date: 05/24/2017 CLINICAL DATA:  Diarrhea for 1 week and generalized weakness for 3 days. EXAM: CHEST  2 VIEW COMPARISON:  June 09, 2016 FINDINGS: The heart  size and mediastinal contours are stable. There is no focal infiltrate, pulmonary edema, or pleural effusion. Surgical clips are identified in the left axilla unchanged. Degenerative joint changes of bilateral shoulders are unchanged. Calcification of left breast is unchanged. IMPRESSION: No active cardiopulmonary disease. Electronically Signed   By: Abelardo Diesel M.D.   On: 05/24/2017 17:23   Ct Head Wo Contrast  Result Date: 05/24/2017 CLINICAL DATA:  Altered mental status. EXAM: CT HEAD WITHOUT CONTRAST TECHNIQUE: Contiguous axial images were obtained from the base of the skull through the vertex without intravenous contrast. COMPARISON:  Head CT dated 04/29/2016. FINDINGS: Brain: Interval mild patchy low density in the pons. Diffusely enlarged ventricles and subarachnoid spaces. Patchy white matter low density in both cerebral hemispheres. Stable old left thalamic lacunar infarct. Stable encephalomalacia in calcifications underneath a right posterior parietal craniotomy defect. No intracranial hemorrhage, mass lesion or CT evidence of acute infarction. Vascular: No hyperdense vessel or unexpected calcification. Skull: Stable right parietal post craniotomy changes. Sinuses/Orbits: Status post bilateral cataract extraction and right lens implant. Unremarkable paranasal sinuses. Other: None. IMPRESSION: 1. No acute abnormality. 2. Stable moderate diffuse cerebral and cerebellar atrophy. 3. Stable extensive small vessel white matter ischemic changes in both cerebral hemispheres, old left thalamic lacunar infarct and right posterior watershed encephalomalacia and parenchymal calcifications beneath a craniotomy defect. Electronically Signed   By: Claudie Revering M.D.   On: 05/24/2017 17:44    Procedures Procedures (including critical care time)  Medications Ordered in ED Medications  potassium chloride 10 mEq in 100 mL IVPB (10 mEq Intravenous New Bag/Given 05/24/17 1928)  clopidogrel (PLAVIX) tablet 75 mg (not  administered)  donepezil (ARICEPT) tablet 10 mg (not administered)  memantine (NAMENDA) tablet 10 mg (not administered)  fludrocortisone (FLORINEF) tablet 0.1 mg (not administered)  escitalopram (LEXAPRO) tablet 20 mg (not administered)  nortriptyline (PAMELOR) capsule 25 mg (not administered)  pregabalin (LYRICA) capsule 75 mg (not  administered)  mirabegron ER (MYRBETRIQ) tablet 50 mg (not administered)  midodrine (PROAMATINE) tablet 10 mg (not administered)  multivitamin with minerals tablet 1 tablet (not administered)  potassium chloride SA (K-DUR,KLOR-CON) CR tablet 40 mEq (not administered)  acetaminophen (TYLENOL) tablet 650 mg (not administered)    Or  acetaminophen (TYLENOL) suppository 650 mg (not administered)  ondansetron (ZOFRAN) tablet 4 mg (not administered)    Or  ondansetron (ZOFRAN) injection 4 mg (not administered)  enoxaparin (LOVENOX) injection 40 mg (not administered)  0.9 %  sodium chloride infusion (not administered)  sodium chloride 0.9 % bolus 1,000 mL (1,000 mLs Intravenous New Bag/Given 05/24/17 1757)     Initial Impression / Assessment and Plan / ED Course  I have reviewed the triage vital signs and the nursing notes.  Pertinent labs & imaging results that were available during my care of the patient were reviewed by me and considered in my medical decision making (see chart for details).     Patient is a 65 year old female who presents with generalized weakness and confusion. She has associated diarrhea. Stool studies were ordered. She's noted to be hypokalemic, likely related to the diarrhea. She was started on potassium replacement. She does have some confusion and agitation but no focalizing neurologic deficits. She was also given IV hydration.  She has no sepsis criteria. No fever or other suggestions of infection. Her urine appears to be a dirty specimen but it was sent for culture. I spoke with Dr. Alcario Drought with the hospitalist service to admit the  patient.  Final Clinical Impressions(s) / ED Diagnoses   Final diagnoses:  Hypokalemia  Generalized weakness  Confusion    New Prescriptions New Prescriptions   No medications on file     Malvin Johns, MD 05/24/17 2006

## 2017-05-25 DIAGNOSIS — Z85841 Personal history of malignant neoplasm of brain: Secondary | ICD-10-CM | POA: Diagnosis not present

## 2017-05-25 DIAGNOSIS — Z9481 Bone marrow transplant status: Secondary | ICD-10-CM | POA: Diagnosis not present

## 2017-05-25 DIAGNOSIS — E860 Dehydration: Secondary | ICD-10-CM | POA: Diagnosis present

## 2017-05-25 DIAGNOSIS — E039 Hypothyroidism, unspecified: Secondary | ICD-10-CM | POA: Diagnosis present

## 2017-05-25 DIAGNOSIS — Z853 Personal history of malignant neoplasm of breast: Secondary | ICD-10-CM | POA: Diagnosis not present

## 2017-05-25 DIAGNOSIS — I951 Orthostatic hypotension: Secondary | ICD-10-CM | POA: Diagnosis present

## 2017-05-25 DIAGNOSIS — E669 Obesity, unspecified: Secondary | ICD-10-CM | POA: Diagnosis not present

## 2017-05-25 DIAGNOSIS — Z888 Allergy status to other drugs, medicaments and biological substances status: Secondary | ICD-10-CM | POA: Diagnosis not present

## 2017-05-25 DIAGNOSIS — Z79899 Other long term (current) drug therapy: Secondary | ICD-10-CM | POA: Diagnosis not present

## 2017-05-25 DIAGNOSIS — Z9884 Bariatric surgery status: Secondary | ICD-10-CM | POA: Diagnosis not present

## 2017-05-25 DIAGNOSIS — R1310 Dysphagia, unspecified: Secondary | ICD-10-CM | POA: Diagnosis present

## 2017-05-25 DIAGNOSIS — Z9049 Acquired absence of other specified parts of digestive tract: Secondary | ICD-10-CM | POA: Diagnosis not present

## 2017-05-25 DIAGNOSIS — G4733 Obstructive sleep apnea (adult) (pediatric): Secondary | ICD-10-CM | POA: Diagnosis present

## 2017-05-25 DIAGNOSIS — K580 Irritable bowel syndrome with diarrhea: Secondary | ICD-10-CM | POA: Diagnosis present

## 2017-05-25 DIAGNOSIS — F0390 Unspecified dementia without behavioral disturbance: Secondary | ICD-10-CM | POA: Diagnosis not present

## 2017-05-25 DIAGNOSIS — E876 Hypokalemia: Secondary | ICD-10-CM | POA: Diagnosis not present

## 2017-05-25 DIAGNOSIS — Z9889 Other specified postprocedural states: Secondary | ICD-10-CM | POA: Diagnosis not present

## 2017-05-25 DIAGNOSIS — G20 Parkinson's disease: Secondary | ICD-10-CM | POA: Diagnosis not present

## 2017-05-25 DIAGNOSIS — I69354 Hemiplegia and hemiparesis following cerebral infarction affecting left non-dominant side: Secondary | ICD-10-CM | POA: Diagnosis not present

## 2017-05-25 DIAGNOSIS — Z823 Family history of stroke: Secondary | ICD-10-CM | POA: Diagnosis not present

## 2017-05-25 DIAGNOSIS — Z7401 Bed confinement status: Secondary | ICD-10-CM | POA: Diagnosis not present

## 2017-05-25 DIAGNOSIS — Z8249 Family history of ischemic heart disease and other diseases of the circulatory system: Secondary | ICD-10-CM | POA: Diagnosis not present

## 2017-05-25 LAB — BASIC METABOLIC PANEL
Anion gap: 9 (ref 5–15)
BUN: 19 mg/dL (ref 6–20)
CO2: 27 mmol/L (ref 22–32)
Calcium: 8.4 mg/dL — ABNORMAL LOW (ref 8.9–10.3)
Chloride: 109 mmol/L (ref 101–111)
Creatinine, Ser: 0.69 mg/dL (ref 0.44–1.00)
GFR calc Af Amer: >60 mL/min (ref >60)
GLUCOSE: 84 mg/dL (ref 65–99)
POTASSIUM: 3 mmol/L — AB (ref 3.5–5.1)
Sodium: 145 mmol/L (ref 135–145)

## 2017-05-25 LAB — C DIFFICILE QUICK SCREEN W PCR REFLEX
C DIFFICILE (CDIFF) INTERP: NOT DETECTED
C DIFFICILE (CDIFF) TOXIN: NEGATIVE
C Diff antigen: NEGATIVE

## 2017-05-25 LAB — TSH: TSH: 1.75 u[IU]/mL (ref 0.350–4.500)

## 2017-05-25 MED ORDER — HYDRALAZINE HCL 20 MG/ML IJ SOLN
5.0000 mg | INTRAMUSCULAR | Status: DC | PRN
  Filled 2017-05-25: qty 0.25

## 2017-05-25 MED ORDER — POTASSIUM CHLORIDE CRYS ER 20 MEQ PO TBCR
40.0000 meq | EXTENDED_RELEASE_TABLET | Freq: Once | ORAL | Status: AC
  Administered 2017-05-25: 40 meq via ORAL
  Filled 2017-05-25: qty 2

## 2017-05-25 MED ORDER — MAGNESIUM SULFATE 2 GM/50ML IV SOLN
2.0000 g | Freq: Once | INTRAVENOUS | Status: AC
  Administered 2017-05-25: 2 g via INTRAVENOUS
  Filled 2017-05-25: qty 50

## 2017-05-25 NOTE — Progress Notes (Signed)
Patient ID: Barbara Flowers, female   DOB: Mar 02, 1951, 66 y.o.   MRN: [id number]    PROGRESS NOTE    Barbara Flowers  EHM:[id number] DOB: [birth year] DOA: 05/24/2017  PCP: Raelyn Number, MD   Brief Narrative:  66 y.o. female with medical history significant of prior stroke, BRCA and brain tumor in 90s, residual L sided weakness, dementia, parkinsonism.  Patient brought in to ED by family with diarrhea for past 1 week, lethargy / generalized weakness for past 3 days.  Daughter is a PA and sent patient to [facility] ED.  Assessment & Plan:   Principal Problem:   Diarrhea - unclear etiology, possibly viral but this is still not clear - C. Diff is negative and there was insufficient sample for GI stool panel - will try to obtain sample for further analysis today     Hypokalemia, hypomagnesemia  - in the setting of diarrhea  - will continue to supplement electrolytes and will repeat BMP and Mg in AM  Active Problems:   History of CVA with residual deficit left side - appears to be at pt's baseline  - will ask for PT/OT eval     Dementia - chronic, continue to monitor mental status while inpatient     Chronic orthostatic hypotension - likely made worse with dehydration in the setting of diarrhea - continue Fludrocortisone   DVT prophylaxis: Lovenox SQ Code Status: Full  Family Communication: Patient at bedside  Disposition Plan: to be determined, likely in 1-2 days when electrolytes stable  Consultants:   None   Procedures:   None  Antimicrobials:   None   Subjective: No events overnight, pt denies chest pain or dyspnea.   Objective: Vitals:   05/24/17 1931 05/24/17 2025 05/24/17 2126 05/25/17 0644  BP: (!) 141/71 (!) 141/71 (!) 141/75 (!) 168/83  Pulse: 72 72 70 70  Resp: _0 Temp:   98.3 F (36.8 C) 98 F (36.7 C)  TempSrc: Oral Oral Oral Oral  SpO2:  99% 100% 100%  Weight:   68.1 kg (150 lb 2.1 oz)   Height:   5' 8" (1.727 m)     Intake/Output Summary  (Last 24 hours) at 05/25/17 1059 Last data filed at 05/25/17 0700  Gross per 24 hour  Intake              945 ml  Output                0 ml  Net              945 ml   Filed Weights   05/24/17 2126  Weight: 68.1 kg (150 lb 2.1 oz)    Examination:  General exam: Appears calm and comfortable  Respiratory system: Respiratory effort normal. Cardiovascular system: S1 & S2 heard, RRR. No JVD, murmurs, rubs, gallops or clicks.  Gastrointestinal system: Abdomen is nondistended, soft and nontender. No organomegaly or masses felt. Normal bowel sounds heard. Central nervous system: Alert but sleepy, able to follow commands, moving all 4 extremities, sensation appears intact bilaterally in upper and lower extremities    Data Reviewed: I have personally reviewed following labs and imaging studies  CBC:  Recent Labs Lab 05/24/17 1540  WBC 10.5  NEUTROABS 7.8*  HGB 12.8  HCT 39.1  MCV 89.7  PLT 366   Basic Metabolic Panel:  Recent Labs Lab 05/24/17 1540 05/24/17 2224 05/25/17 0357  NA 145  --  145  K 2.4*  --  3.0*  CL 105  --  109  CO2 28  --  27  GLUCOSE 85  --  84  BUN 21*  --  19  CREATININE 0.93  --  0.69  CALCIUM 8.7*  --  8.4*  MG  --  1.5*  --    Liver Function Tests:  Recent Labs Lab 05/24/17 1540  AST 18  ALT 10*  ALKPHOS 114  BILITOT 0.8  PROT 7.1  ALBUMIN 3.6    Recent Labs Lab 05/24/17 1540  LIPASE 34   Thyroid Function Tests:  Recent Labs  05/25/17 0357  TSH 1.750   Urine analysis:    Component Value Date/Time   COLORURINE YELLOW 05/24/2017 1754   APPEARANCEUR HAZY (A) 05/24/2017 1754   LABSPEC 1.023 05/24/2017 1754   PHURINE 5.0 05/24/2017 1754   GLUCOSEU NEGATIVE 05/24/2017 1754   HGBUR SMALL (A) 05/24/2017 1754   BILIRUBINUR NEGATIVE 05/24/2017 1754   KETONESUR 5 (A) 05/24/2017 1754   PROTEINUR 30 (A) 05/24/2017 1754   NITRITE NEGATIVE 05/24/2017 1754   LEUKOCYTESUR SMALL (A) 05/24/2017 1754   Recent Results (from the past  240 hour(s))  C difficile quick screen w PCR reflex     Status: None   Collection Time: 05/25/17  3:54 AM  Result Value Ref Range Status   C Diff antigen NEGATIVE NEGATIVE Final   C Diff toxin NEGATIVE NEGATIVE Final   C Diff interpretation No C. difficile detected.  Final    Radiology Studies: Dg Chest 2 View  Result Date: 05/24/2017 CLINICAL DATA:  Diarrhea for 1 week and generalized weakness for 3 days. EXAM: CHEST  2 VIEW COMPARISON:  June 09, 2016 FINDINGS: The heart size and mediastinal contours are stable. There is no focal infiltrate, pulmonary edema, or pleural effusion. Surgical clips are identified in the left axilla unchanged. Degenerative joint changes of bilateral shoulders are unchanged. Calcification of left breast is unchanged. IMPRESSION: No active cardiopulmonary disease. Electronically Signed   By: Abelardo Diesel M.D.   On: 05/24/2017 17:23   Ct Head Wo Contrast  Result Date: 05/24/2017 CLINICAL DATA:  Altered mental status. EXAM: CT HEAD WITHOUT CONTRAST TECHNIQUE: Contiguous axial images were obtained from the base of the skull through the vertex without intravenous contrast. COMPARISON:  Head CT dated 04/29/2016. FINDINGS: Brain: Interval mild patchy low density in the pons. Diffusely enlarged ventricles and subarachnoid spaces. Patchy white matter low density in both cerebral hemispheres. Stable old left thalamic lacunar infarct. Stable encephalomalacia in calcifications underneath a right posterior parietal craniotomy defect. No intracranial hemorrhage, mass lesion or CT evidence of acute infarction. Vascular: No hyperdense vessel or unexpected calcification. Skull: Stable right parietal post craniotomy changes. Sinuses/Orbits: Status post bilateral cataract extraction and right lens implant. Unremarkable paranasal sinuses. Other: None. IMPRESSION: 1. No acute abnormality. 2. Stable moderate diffuse cerebral and cerebellar atrophy. 3. Stable extensive small vessel white  matter ischemic changes in both cerebral hemispheres, old left thalamic lacunar infarct and right posterior watershed encephalomalacia and parenchymal calcifications beneath a craniotomy defect. Electronically Signed   By: Claudie Revering M.D.   On: 05/24/2017 17:44   Scheduled Meds: . chlorhexidine  15 mL Mouth Rinse BID  . clopidogrel  75 mg Oral Daily  . donepezil  10 mg Oral QHS  . enoxaparin (LOVENOX) injection  40 mg Subcutaneous Q24H  . escitalopram  20 mg Oral Daily  . fludrocortisone  0.1 mg Oral TID  . mouth rinse  15 mL Mouth Rinse q12n4p  .  memantine  10 mg Oral BID  . midodrine  10 mg Oral TID WC  . mirabegron ER  50 mg Oral Daily  . multivitamin with minerals  1 tablet Oral Daily  . nortriptyline  25 mg Oral BID  . pregabalin  75 mg Oral BID   Continuous Infusions: . sodium chloride 100 mL/hr at 05/25/17 0821     LOS: 0 days   Time spent: 25 minutes   Faye Ramsay, MD [facility] Pager [phone]  If 7PM-7AM, please contact night-coverage [email or web address] Password [email or web address] 05/25/2017, 10:59 AM

## 2017-05-26 DIAGNOSIS — E876 Hypokalemia: Principal | ICD-10-CM

## 2017-05-26 LAB — BASIC METABOLIC PANEL
ANION GAP: 5 (ref 5–15)
BUN: 10 mg/dL (ref 6–20)
CALCIUM: 8.1 mg/dL — AB (ref 8.9–10.3)
CO2: 27 mmol/L (ref 22–32)
Chloride: 108 mmol/L (ref 101–111)
Creatinine, Ser: 0.51 mg/dL (ref 0.44–1.00)
GFR calc non Af Amer: >60 mL/min (ref >60)
Glucose, Bld: 80 mg/dL (ref 65–99)
Potassium: 3.3 mmol/L — ABNORMAL LOW (ref 3.5–5.1)
SODIUM: 140 mmol/L (ref 135–145)

## 2017-05-26 LAB — URINE CULTURE

## 2017-05-26 LAB — CBC
HCT: 32.9 % — ABNORMAL LOW (ref 36.0–46.0)
HEMOGLOBIN: 10.8 g/dL — AB (ref 12.0–15.0)
MCH: 29.3 pg (ref 26.0–34.0)
MCHC: 32.8 g/dL (ref 30.0–36.0)
MCV: 89.4 fL (ref 78.0–100.0)
Platelets: 168 10*3/uL (ref 150–400)
RBC: 3.68 MIL/uL — ABNORMAL LOW (ref 3.87–5.11)
RDW: 14.2 % (ref 11.5–15.5)
WBC: 8.6 10*3/uL (ref 4.0–10.5)

## 2017-05-26 LAB — MAGNESIUM: MAGNESIUM: 1.7 mg/dL (ref 1.7–2.4)

## 2017-05-26 MED ORDER — POTASSIUM CHLORIDE CRYS ER 20 MEQ PO TBCR
40.0000 meq | EXTENDED_RELEASE_TABLET | Freq: Once | ORAL | Status: AC
  Administered 2017-05-26: 40 meq via ORAL
  Filled 2017-05-26: qty 2

## 2017-05-26 MED ORDER — MAGNESIUM OXIDE 400 (241.3 MG) MG PO TABS
400.0000 mg | ORAL_TABLET | Freq: Two times a day (BID) | ORAL | Status: DC
  Administered 2017-05-26 – 2017-05-28 (×5): 400 mg via ORAL
  Filled 2017-05-26 (×5): qty 1

## 2017-05-26 MED ORDER — POTASSIUM CHLORIDE IN NACL 40-0.9 MEQ/L-% IV SOLN
INTRAVENOUS | Status: DC
  Administered 2017-05-26 – 2017-05-27 (×3): 100 mL/h via INTRAVENOUS
  Filled 2017-05-26 (×3): qty 1000

## 2017-05-26 NOTE — Progress Notes (Signed)
PROGRESS NOTE    Barbara Flowers  MEQ:[id number] DOB: 1951-04-30 DOA: 05/24/2017 PCP: Raelyn Number, MD   Brief Narrative: 66 yo female admitted with diarrhea.patient has history of CVA,PD with dysphagia and residual weakness to the left side.stool sample negative for cdiff.      Assessment & Plan:   Principal Problem:   Hypokalemia Active Problems:   History of CVA with residual deficit left side   Dementia   Chronic orthostatic hypotension   Diarrhea  Viral gastroenteritis.improving.slow iv hydration.cdiff negative.  Hypokalemia/Hypomagnesemia secondary to diarrhea.replete both.levels are 3.3 k and 1.7 magnesium.  Dementia/CVA at baseline.PT/OT Eval.  Orthostatic hypotension?shy drager with parkinsons disease on fludrocortisone and midrodrine chronically.  Dehydration slow iv hydration.      DVT prophylaxis: lovenox Code Status:full Family Communication: will dw daughter. Disposition Plan:    Consultants: none  Procedures: none  Antimicrobials: none   Subjective:   Objective: Vitals:   05/25/17 1200 05/25/17 1420 05/25/17 2000 05/26/17 0440  BP: 130/75 139/66 (!) 169/79 (!) 155/78  Pulse: 70 65 71 70  Resp: 16  18 16   Temp: 98.1 F (36.7 C) 98.6 F (37 C) 98.4 F (36.9 C) 98.4 F (36.9 C)  TempSrc: Oral Axillary Oral Oral  SpO2: 97% 100% 100% 98%  Weight:      Height:        Intake/Output Summary (Last 24 hours) at 05/26/17 1302 Last data filed at 05/26/17 0803  Gross per 24 hour  Intake          1350.83 ml  Output              700 ml  Net           650.83 ml   Filed Weights   05/24/17 2126  Weight: 68.1 kg (150 lb 2.1 oz)    Examination:  General exam: Appears calm and comfortable  Respiratory system: Clear to auscultation. Respiratory effort normal. Cardiovascular system: S1 & S2 heard, RRR. No JVD, murmurs, rubs, gallops or clicks. No pedal edema. Gastrointestinal system: Abdomen is nondistended, soft and nontender. No  organomegaly or masses felt. Normal bowel sounds heard. Central nervous system: does not follow commands.dysphagic.left sided weakness. Extremities: left sided weaknes.. Skin: No rashes, lesions or ulcer     Data Reviewed: I have personally reviewed following labs and imaging studies  CBC:  Recent Labs Lab 05/24/17 1540 05/26/17 0407  WBC 10.5 8.6  NEUTROABS 7.8*  --   HGB 12.8 10.8*  HCT 39.1 32.9*  MCV 89.7 89.4  PLT 194 297   Basic Metabolic Panel:  Recent Labs Lab 05/24/17 1540 05/24/17 2224 05/25/17 0357 05/26/17 0407  NA 145  --  145 140  K 2.4*  --  3.0* 3.3*  CL 105  --  109 108  CO2 28  --  27 27  GLUCOSE 85  --  84 80  BUN 21*  --  19 10  CREATININE 0.93  --  0.69 0.51  CALCIUM 8.7*  --  8.4* 8.1*  MG  --  1.5*  --  1.7   GFR: Estimated Creatinine Clearance: 68.8 mL/min (by C-G formula based on SCr of 0.51 mg/dL). Liver Function Tests:  Recent Labs Lab 05/24/17 1540  AST 18  ALT 10*  ALKPHOS 114  BILITOT 0.8  PROT 7.1  ALBUMIN 3.6    Recent Labs Lab 05/24/17 1540  LIPASE 34   No results for input(s): AMMONIA in the last 168 hours. Coagulation Profile: No results for  input(s): INR, PROTIME in the last 168 hours. Cardiac Enzymes: No results for input(s): CKTOTAL, CKMB, CKMBINDEX, TROPONINI in the last 168 hours. BNP (last 3 results) No results for input(s): PROBNP in the last 8760 hours. HbA1C: No results for input(s): HGBA1C in the last 72 hours. CBG: No results for input(s): GLUCAP in the last 168 hours. Lipid Profile: No results for input(s): CHOL, HDL, LDLCALC, TRIG, CHOLHDL, LDLDIRECT in the last 72 hours. Thyroid Function Tests:  Recent Labs  05/25/17 0357  TSH 1.750   Anemia Panel: No results for input(s): VITAMINB12, FOLATE, FERRITIN, TIBC, IRON, RETICCTPCT in the last 72 hours. Sepsis Labs: No results for input(s): PROCALCITON, LATICACIDVEN in the last 168 hours.  Recent Results (from the past 240 hour(s))  Urine  culture     Status: Abnormal   Collection Time: 05/24/17  5:54 PM  Result Value Ref Range Status   Specimen Description URINE, CATHETERIZED  Final   Special Requests NONE  Final   Culture MULTIPLE SPECIES PRESENT, SUGGEST RECOLLECTION (A)  Final   Report Status 05/26/2017 FINAL  Final  C difficile quick screen w PCR reflex     Status: None   Collection Time: 05/25/17  3:54 AM  Result Value Ref Range Status   C Diff antigen NEGATIVE NEGATIVE Final   C Diff toxin NEGATIVE NEGATIVE Final   C Diff interpretation No C. difficile detected.  Final         Radiology Studies: Dg Chest 2 View  Result Date: 05/24/2017 CLINICAL DATA:  Diarrhea for 1 week and generalized weakness for 3 days. EXAM: CHEST  2 VIEW COMPARISON:  June 09, 2016 FINDINGS: The heart size and mediastinal contours are stable. There is no focal infiltrate, pulmonary edema, or pleural effusion. Surgical clips are identified in the left axilla unchanged. Degenerative joint changes of bilateral shoulders are unchanged. Calcification of left breast is unchanged. IMPRESSION: No active cardiopulmonary disease. Electronically Signed   By: Abelardo Diesel M.D.   On: 05/24/2017 17:23   Ct Head Wo Contrast  Result Date: 05/24/2017 CLINICAL DATA:  Altered mental status. EXAM: CT HEAD WITHOUT CONTRAST TECHNIQUE: Contiguous axial images were obtained from the base of the skull through the vertex without intravenous contrast. COMPARISON:  Head CT dated 04/29/2016. FINDINGS: Brain: Interval mild patchy low density in the pons. Diffusely enlarged ventricles and subarachnoid spaces. Patchy white matter low density in both cerebral hemispheres. Stable old left thalamic lacunar infarct. Stable encephalomalacia in calcifications underneath a right posterior parietal craniotomy defect. No intracranial hemorrhage, mass lesion or CT evidence of acute infarction. Vascular: No hyperdense vessel or unexpected calcification. Skull: Stable right parietal  post craniotomy changes. Sinuses/Orbits: Status post bilateral cataract extraction and right lens implant. Unremarkable paranasal sinuses. Other: None. IMPRESSION: 1. No acute abnormality. 2. Stable moderate diffuse cerebral and cerebellar atrophy. 3. Stable extensive small vessel white matter ischemic changes in both cerebral hemispheres, old left thalamic lacunar infarct and right posterior watershed encephalomalacia and parenchymal calcifications beneath a craniotomy defect. Electronically Signed   By: Claudie Revering M.D.   On: 05/24/2017 17:44        Scheduled Meds: . chlorhexidine  15 mL Mouth Rinse BID  . clopidogrel  75 mg Oral Daily  . donepezil  10 mg Oral QHS  . enoxaparin (LOVENOX) injection  40 mg Subcutaneous Q24H  . escitalopram  20 mg Oral Daily  . fludrocortisone  0.1 mg Oral TID  . magnesium oxide  400 mg Oral BID  . mouth rinse  15 mL Mouth Rinse q12n4p  . memantine  10 mg Oral BID  . midodrine  10 mg Oral TID WC  . mirabegron ER  50 mg Oral Daily  . multivitamin with minerals  1 tablet Oral Daily  . nortriptyline  25 mg Oral BID  . pregabalin  75 mg Oral BID   Continuous Infusions: . sodium chloride 75 mL/hr at 05/26/17 0732     LOS: 1 day    Time spent:     Georgette Shell, MD [facility]   If 7PM-7AM, please contact night-coverage [email or web address] Password [email or web address] 05/26/2017, 1:02 PM

## 2017-05-26 NOTE — Evaluation (Signed)
Occupational Therapy Evaluation Patient Details Name: Barbara Flowers MRN: [id number] DOB: 10-May-1951 Today's Date: 05/26/2017    History of Present Illness 66 y.o. female with medical history significant of prior stroke, BRCA and brain tumor in 90s.  Residual L sided weakness.  Dementia, parkinsonism.  Patient brought in to ED by family with diarrhea for past 1 week, lethargy / generalized weakness for past 3 days.   Clinical Impression   This 66 y/o F presents with the above. Pt with baseline cognitive deficits so unclear as to her baseline PLOF. Pt required MaxA +2 for squat pivot transfers this session, requires Max-total assist for LB ADLs. Pt will benefit from continued acute OT services and based upon Pt's current assist level recommend OT services in SNF setting prior to return home to maximize Pt's safety and independence with ADLs and functional mobility as well as to decrease level of caregiver assist.     Follow Up Recommendations  SNF;Supervision/Assistance - 24 hour    Equipment Recommendations  Other (comment) (to be further assessed/determined)           Precautions / Restrictions Precautions Precautions: Fall Restrictions Weight Bearing Restrictions: No      Mobility Bed Mobility Overal bed mobility: Needs Assistance Bed Mobility: Supine to Sit     Supine to sit: Max assist;+2 for physical assistance;+2 for safety/equipment     General bed mobility comments: Assist to advance LEs over EOB and to bring trunk into upright position; Pt requires MaxA to maintain static sitting balance   Transfers Overall transfer level: Needs assistance Equipment used: 2 person hand held assist;None Transfers: Set designer Transfers;Sit to/from Stand Sit to Stand: Max assist;+2 safety/equipment   Squat pivot transfers: Max assist;+2 physical assistance;+2 safety/equipment     General transfer comment: Attempted sit<>stand with Pt unable to fully achieve standing posture;  requires +2 assist for squat pivot EOB to recliner, +2 assist to scoot hips to back of recliner     Balance Overall balance assessment: Needs assistance Sitting-balance support: Feet supported;Single extremity supported Sitting balance-Leahy Scale: Zero Sitting balance - Comments: strong lateral/posterior lean  Postural control: Left lateral lean;Posterior lean Standing balance support: Single extremity supported Standing balance-Leahy Scale: Poor                             ADL either performed or assessed with clinical judgement   ADL Overall ADL's : Needs assistance/impaired Eating/Feeding: Set up;Minimal assistance;With adaptive utensils;Sitting Eating/Feeding Details (indicate cue type and reason): setup to cut food and open containers, Pt able to hold utensil but has difficulty scooping food onto utensil, may benefit from scoop plate to increase independence with task  Grooming: Wash/dry face;Set up;Sitting Grooming Details (indicate cue type and reason): supported sitting  Upper Body Bathing: Moderate assistance;Sitting   Lower Body Bathing: Maximal assistance;Bed level   Upper Body Dressing : Moderate assistance;Sitting   Lower Body Dressing: Total assistance;Sit to/from stand;+2 for physical assistance;+2 for safety/equipment Lower Body Dressing Details (indicate cue type and reason): total assist to don socks at bed level  Toilet Transfer: Maximal assistance;+2 for physical assistance;+2 for safety/equipment;Stand-pivot;BSC Toilet Transfer Details (indicate cue type and reason): simulated through bed to chair transfer  Toileting- Clothing Manipulation and Hygiene: Total assistance;+2 for physical assistance;+2 for safety/equipment;Sit to/from stand       Functional mobility during ADLs: Maximal assistance;+2 for safety/equipment;+2 for physical assistance General ADL Comments: Pt requires MaxA for maintaining sitting balance EOB, MaxA +2  for squat pivot to  recliner                          Pertinent Vitals/Pain Pain Assessment: Faces Faces Pain Scale: Hurts a little bit Pain Location: R lower back  Pain Descriptors / Indicators: Aching;Sore Pain Intervention(s): Monitored during session;Repositioned          Extremity/Trunk Assessment Upper Extremity Assessment Upper Extremity Assessment: RUE deficits/detail;LUE deficits/detail RUE Deficits / Details: generalized weakness, AROM appears WFL  LUE Deficits / Details: residual deficits from previous CVA; L hand contractures; Pt with very limited AROM    Lower Extremity Assessment Lower Extremity Assessment: Defer to PT evaluation       Communication Communication Communication: No difficulties   Cognition Arousal/Alertness: Awake/alert Behavior During Therapy: WFL for tasks assessed/performed Overall Cognitive Status: History of cognitive impairments - at baseline                                                      Home Living Family/patient expects to be discharged to:: Unsure Living Arrangements: Spouse/significant other;Children;Other relatives                               Additional Comments: Pt with baseline cognitive deficits, difficulty obtaining PLOF, no family/caregiver present to determine PLOF       Prior Functioning/Environment          Comments: Pt reports using a walker and w/c, unclear as to how much assist Pt required for mobility/ADL completion        OT Problem List: Decreased strength;Impaired balance (sitting and/or standing);Decreased range of motion;Decreased activity tolerance      OT Treatment/Interventions: Self-care/ADL training;DME and/or AE instruction;Therapeutic activities;Balance training;Therapeutic exercise;Manual therapy;Patient/family education    OT Goals(Current goals can be found in the care plan section) Acute Rehab OT Goals Patient Stated Goal: none stated  OT Goal Formulation:  With patient Time For Goal Achievement: 06/09/17 Potential to Achieve Goals: Fair  OT Frequency: Min 2X/week                             AM-PAC PT "6 Clicks" Daily Activity     Outcome Measure Help from another person eating meals?: A Lot Help from another person taking care of personal grooming?: A Lot Help from another person toileting, which includes using toliet, bedpan, or urinal?: Total Help from another person bathing (including washing, rinsing, drying)?: A Lot Help from another person to put on and taking off regular upper body clothing?: A Lot Help from another person to put on and taking off regular lower body clothing?: Total 6 Click Score: 10   End of Session Equipment Utilized During Treatment: Gait belt Nurse Communication: Mobility status;Other (comment) (NT )  Activity Tolerance: Patient tolerated treatment well Patient left: in chair;with call bell/phone within reach;with chair alarm set  OT Visit Diagnosis: Unsteadiness on feet (R26.81);Muscle weakness (generalized) (M62.81)                Time: 1975-8832 OT Time Calculation (min): 35 min Charges:  OT General Charges $OT Visit: 1 Visit OT Evaluation $OT Eval Moderate Complexity: 1 Mod OT Treatments $Self Care/Home Management : 8-22 mins G-Codes:  Lou Cal, OT Pager [phone] 05/26/2017   Raymondo Band 05/26/2017, 1:24 PM

## 2017-05-26 NOTE — Evaluation (Signed)
Physical Therapy Evaluation Patient Details Name: Barbara Flowers MRN: [id number] DOB: [birth year] Today's Date: 05/26/2017   History of Present Illness  66 y.o. female with medical history significant of prior stroke, BRCA and brain tumor in 90s, Residual L sided weakness, Dementia, Parkinsonism, seizures, memory loss.  Patient brought in to ED by family with diarrhea for past 1 week, lethargy / generalized weakness for past 3 days and admitted for Viral gastroenteritis and hypokalemia  Clinical Impression  Pt admitted with above diagnosis. Pt currently with functional limitations due to the deficits listed below (see PT Problem List).  Pt will benefit from skilled PT to increase their independence and safety with mobility to allow discharge to the venue listed below.  Pt assisted back to bed from recliner with squat pivot.  Pt did not seem like she wished to return to bed however leaning heavily to left side in recliner upon arrival, so encouraged assist back to bed.  Pt stated she wished to be left alone once returned to bed.  Pt still laying more towards left side however placed pillows between knees as well as left knee and railing (requested NT to reposition if pt allows once pt rests for a little).  No family present during session.  Pt reports some assist for transfers at home however PLOF unknown.  Pt may benefit from SNF if family cannot provide current level of assist.     Follow Up Recommendations SNF    Equipment Recommendations  Other (comment) (TBA if returns home, otherwise defer to SNF)    Recommendations for Other Services       Precautions / Restrictions Precautions Precautions: Fall Precaution Comments: L sided residual deficits Restrictions Weight Bearing Restrictions: No      Mobility  Bed Mobility Overal bed mobility: Needs Assistance Bed Mobility: Sit to Sidelying     Supine to sit: Max assist;+2 for physical assistance;+2 for safety/equipment   Sit to  sidelying: Mod assist General bed mobility comments: pt able to assist with guiding upper body, assist for LEs onto bed  Transfers Overall transfer level: Needs assistance Equipment used: None Transfers: Squat Pivot Transfers Sit to Stand: Total assist;+2 physical assistance   Squat pivot transfers: +2 safety/equipment     General transfer comment: provided verbal cues for squat pivot technique with opposite head/hips, knees blocked for safety  Ambulation/Gait                Stairs            Wheelchair Mobility    Modified Rankin (Stroke Patients Only)       Balance Overall balance assessment: Needs assistance Sitting-balance support: Feet supported;Single extremity supported Sitting balance-Leahy Scale: Zero Sitting balance - Comments: strong lateral/posterior lean  Postural control: Left lateral lean;Posterior lean Standing balance support: Single extremity supported Standing balance-Leahy Scale: Poor                               Pertinent Vitals/Pain Pain Assessment: Faces Faces Pain Scale: Hurts a little bit Pain Location: no location stated Pain Descriptors / Indicators: Aching;Sore Pain Intervention(s): Repositioned;Monitored during session    Home Living Family/patient expects to be discharged to:: Unsure Living Arrangements: Spouse/significant other;Children;Other relatives               Additional Comments: Pt with baseline cognitive deficits, difficulty obtaining PLOF, no family/caregiver however pt reports "[name]" assists her with transfers, states yes to using w/c  Prior Function Level of Independence: Needs assistance         Comments: Pt reports using a walker and w/c, unclear as to how much assist      Hand Dominance        Extremity/Trunk Assessment   Upper Extremity Assessment Upper Extremity Assessment: RUE deficits/detail;LUE deficits/detail RUE Deficits / Details: generalized weakness, AROM appears  WFL  LUE Deficits / Details: residual deficits from previous CVA; L hand contractures; Pt with very limited AROM     Lower Extremity Assessment Lower Extremity Assessment: Generalized weakness;LLE deficits/detail;RLE deficits/detail RLE Deficits / Details: grossly observed 2+/5 at best throughout LEs, pt also appears to have knee flexion contractures however would not allow therapist to assess further today once back in bed - preferred LE flexion positioning       Communication   Communication: Expressive difficulties  Cognition Arousal/Alertness: Awake/alert Behavior During Therapy: WFL for tasks assessed/performed Overall Cognitive Status: No family/caregiver present to determine baseline cognitive functioning                                        General Comments      Exercises     Assessment/Plan    PT Assessment Patient needs continued PT services  PT Problem List Decreased strength;Decreased mobility;Decreased activity tolerance;Decreased balance;Decreased knowledge of use of DME       PT Treatment Interventions DME instruction;Therapeutic activities;Therapeutic exercise;Functional mobility training;Patient/family education;Wheelchair mobility training;Balance training;Gait training    PT Goals (Current goals can be found in the Care Plan section)  Acute Rehab PT Goals Patient Stated Goal: none stated  PT Goal Formulation: With patient Time For Goal Achievement: 06/02/17 Potential to Achieve Goals: Fair    Frequency Min 2X/week   Barriers to discharge        Co-evaluation               AM-PAC PT "6 Clicks" Daily Activity  Outcome Measure Difficulty turning over in bed (including adjusting bedclothes, sheets and blankets)?: Unable Difficulty moving from lying on back to sitting on the side of the bed? : Unable Difficulty sitting down on and standing up from a chair with arms (e.g., wheelchair, bedside commode, etc,.)?: Unable Help  needed moving to and from a bed to chair (including a wheelchair)?: Total Help needed walking in hospital room?: Total Help needed climbing 3-5 steps with a railing? : Total 6 Click Score: 6    End of Session Equipment Utilized During Treatment: Gait belt Activity Tolerance: Patient limited by fatigue Patient left: in bed;with call bell/phone within reach;with bed alarm set Nurse Communication: Mobility status;Need for lift equipment PT Visit Diagnosis: Muscle weakness (generalized) (M62.81)    Time: 8295-6213 PT Time Calculation (min) (ACUTE ONLY): 16 min   Charges:   PT Evaluation $PT Eval Low Complexity: 1 Low     PT G Codes:        Carmelia Bake, PT, DPT 05/26/2017 Pager: [phone]  York Ram E 05/26/2017, 3:56 PM

## 2017-05-27 ENCOUNTER — Encounter (HOSPITAL_COMMUNITY): Payer: Self-pay | Admitting: *Deleted

## 2017-05-27 LAB — BASIC METABOLIC PANEL
ANION GAP: 5 (ref 5–15)
BUN: 6 mg/dL (ref 6–20)
CO2: 28 mmol/L (ref 22–32)
Calcium: 8.3 mg/dL — ABNORMAL LOW (ref 8.9–10.3)
Chloride: 111 mmol/L (ref 101–111)
Creatinine, Ser: 0.5 mg/dL (ref 0.44–1.00)
GFR calc Af Amer: >60 mL/min (ref >60)
GFR calc non Af Amer: >60 mL/min (ref >60)
GLUCOSE: 87 mg/dL (ref 65–99)
Potassium: 3.9 mmol/L (ref 3.5–5.1)
Sodium: 144 mmol/L (ref 135–145)

## 2017-05-27 MED ORDER — SODIUM CHLORIDE 0.9 % IV SOLN
INTRAVENOUS | Status: DC
  Administered 2017-05-27 – 2017-05-28 (×2): via INTRAVENOUS

## 2017-05-27 MED ORDER — MAGNESIUM OXIDE 400 (241.3 MG) MG PO TABS: 400.0000 mg | ORAL_TABLET | Freq: Two times a day (BID) | ORAL | 0 refills | Status: DC

## 2017-05-27 MED ORDER — PREGABALIN 75 MG PO CAPS
75.0000 mg | ORAL_CAPSULE | Freq: Every day | ORAL | Status: DC
  Administered 2017-05-28: 75 mg via ORAL
  Filled 2017-05-27: qty 1

## 2017-05-27 MED ORDER — CHLORHEXIDINE GLUCONATE 0.12 % MT SOLN: 15.0000 mL | Freq: Two times a day (BID) | OROMUCOSAL | 0 refills | Status: DC

## 2017-05-27 NOTE — Clinical Social Work Note (Signed)
Clinical Social Work Assessment  Patient Details  Name: Barbara Flowers MRN: [id number] Date of Birth: 1951/01/10  Date of referral:  05/27/17               Reason for consult:  Facility Placement                Permission sought to share information with:  Chartered certified accountant granted to share information::  Yes, Verbal Permission Granted  Name::        Agency::     Relationship::     Contact Information:     Housing/Transportation Living arrangements for the past 2 months:  Single Family Home Source of Information:  Spouse Patient Interpreter Needed:  None Criminal Activity/Legal Involvement Pertinent to Current Situation/Hospitalization:  No - Comment as needed Significant Relationships:  Spouse, Other Family Members Lives with:  Spouse Do you feel safe going back to the place where you live?   (PT recommending SNF) Need for family participation in patient care:  Yes (Comment)  Care giving concerns:  Patient from home with husband. Patient's husband reported that patient is WC bound at baseline but was able to assist with transfers, eating independently and having conversations about a week prior to hospitalization. PT recommending SNF, patient's husband agreeable noting he can not care for patient in the home in her current condition.   Social Worker assessment / plan:  CSW spoke with patient/patient's husband at bedside, patient was alert but did not participate in assessment. CSW and patient's husband discussed PT recommendation for SNF, patient's husband reported that he was agreeable and that he prefers [facility] or [facility]. Patient's husband reported that he is familiar with the process because patient has been to SNF in the past. Patient's husband reported that patient will need PTAR for transport to SNF at discharge.  CSW completed FL2 and will contact [facility] to inquire about bed availability for patient.   Employment status:    Insurance  information:  Medicare PT Recommendations:  [place] / Referral to community resources:  [place]  Patient/Family's Response to care:  Patient/patient's husband agreeable to SNF.   Patient/Family's Understanding of and Emotional Response to Diagnosis, Current Treatment, and Prognosis: Patient's husband verbalized understanding of patient's diagnosis, current treatment and prognosis. Patient's husband verbalized understanding of plan for patient to discharge to SNF. Patient's husband reported that the goal is for patient to regain strength and return home, noting it is football season and that patient needs to be home to watch football with him.   Emotional Assessment Appearance:  Appears older than stated age Attitude/Demeanor/Rapport:  Unable to Assess Affect (typically observed):  Happy Orientation:  Oriented to Self, Oriented to Place Alcohol / Substance use:  Not Applicable Psych involvement (Current and /or in the community):  No (Comment)  Discharge Needs  Concerns to be addressed:  No discharge needs identified Readmission within the last 30 days:  No Current discharge risk:  None Barriers to Discharge:  Continued Medical Work up   [employer], [place] 05/27/2017, 2:16 PM

## 2017-05-27 NOTE — Progress Notes (Signed)
PROGRESS NOTE    Barbara Flowers  AYT:[id number] DOB: 11-21-1950 DOA: 05/24/2017 PCP: Raelyn Number, MD    Brief Narrative: 66 year old female with history of stroke, Parkinson's disease, dementia admitted with nausea vomiting and diarrhea. Stool C. difficile is negative. Patient had an episode of lethargy and weakness nurse was not able to wake her up. When I saw her earlier today she was awake and alert and eating breakfast. Speech therapy could not evaluate her due to her lethargy.   Assessment & Plan:   Principal Problem:   Hypokalemia Active Problems:   History of CVA with residual deficit left side   Dementia   Chronic orthostatic hypotension   Diarrhea  Lethargy and weakness rule out polypharmacy. I will decrease the dose of Lyrica to once a day. DC nortriptyline which can cause confusion in elderly. I will do a UA and a urine culture. I will also give her IV fluids at a low rate. Hypokalemia has been resolved.    DVT prophylaxis: lovenox Code Status: full Family Communication:  Disposition Plan   Consultants  Procedures:   Antimicrobials:    Subjective: Feels tired  Objective: Vitals:   05/26/17 2111 05/27/17 0516 05/27/17 0809 05/27/17 1640  BP: (!) 161/84 (!) 155/86 (!) 151/73 (!) 115/59  Pulse: 72 70 65 68  Resp: 18 16 18 18   Temp: 99.1 F (37.3 C) 97.6 F (36.4 C) 98.1 F (36.7 C) 98.3 F (36.8 C)  TempSrc: Oral Oral Oral Oral  SpO2: 100% 97% 100% 99%  Weight:      Height:        Intake/Output Summary (Last 24 hours) at 05/27/17 1731 Last data filed at 05/27/17 1706  Gross per 24 hour  Intake          2963.75 ml  Output             1500 ml  Net          1463.75 ml   Filed Weights   05/24/17 2126  Weight: 68.1 kg (150 lb 2.1 oz)    Examination:  General exam: Appears calm and comfortable  Respiratory system: Clear to auscultation. Respiratory effort normal. Cardiovascular system: S1 & S2 heard, RRR. No JVD, murmurs, rubs, gallops or  clicks. No pedal edema. Gastrointestinal system: Abdomen is nondistended, soft and nontender. No organomegaly or masses felt. Normal bowel sounds heard. Central nervous system: Alert and oriented. No focal neurological deficits. Extremities: Symmetric 5 x 5 power. Skin: No rashes, lesions or ulcers Psychiatry: Judgement and insight appear normal. Mood & affect appropriate.     Data Reviewed: I have personally reviewed following labs and imaging studies  CBC:  Recent Labs Lab 05/24/17 1540 05/26/17 0407  WBC 10.5 8.6  NEUTROABS 7.8*  --   HGB 12.8 10.8*  HCT 39.1 32.9*  MCV 89.7 89.4  PLT 194 355   Basic Metabolic Panel:  Recent Labs Lab 05/24/17 1540 05/24/17 2224 05/25/17 0357 05/26/17 0407 05/27/17 0410  NA 145  --  145 140 144  K 2.4*  --  3.0* 3.3* 3.9  CL 105  --  109 108 111  CO2 28  --  27 27 28   GLUCOSE 85  --  84 80 87  BUN 21*  --  19 10 6   CREATININE 0.93  --  0.69 0.51 0.50  CALCIUM 8.7*  --  8.4* 8.1* 8.3*  MG  --  1.5*  --  1.7  --    GFR: Estimated Creatinine  Clearance: 68.8 mL/min (by C-G formula based on SCr of 0.5 mg/dL). Liver Function Tests:  Recent Labs Lab 05/24/17 1540  AST 18  ALT 10*  ALKPHOS 114  BILITOT 0.8  PROT 7.1  ALBUMIN 3.6    Recent Labs Lab 05/24/17 1540  LIPASE 34   No results for input(s): AMMONIA in the last 168 hours. Coagulation Profile: No results for input(s): INR, PROTIME in the last 168 hours. Cardiac Enzymes: No results for input(s): CKTOTAL, CKMB, CKMBINDEX, TROPONINI in the last 168 hours. BNP (last 3 results) No results for input(s): PROBNP in the last 8760 hours. HbA1C: No results for input(s): HGBA1C in the last 72 hours. CBG: No results for input(s): GLUCAP in the last 168 hours. Lipid Profile: No results for input(s): CHOL, HDL, LDLCALC, TRIG, CHOLHDL, LDLDIRECT in the last 72 hours. Thyroid Function Tests:  Recent Labs  05/25/17 0357  TSH 1.750   Anemia Panel: No results for  input(s): VITAMINB12, FOLATE, FERRITIN, TIBC, IRON, RETICCTPCT in the last 72 hours. Sepsis Labs: No results for input(s): PROCALCITON, LATICACIDVEN in the last 168 hours.  Recent Results (from the past 240 hour(s))  Urine culture     Status: Abnormal   Collection Time: 05/24/17  5:54 PM  Result Value Ref Range Status   Specimen Description URINE, CATHETERIZED  Final   Special Requests NONE  Final   Culture MULTIPLE SPECIES PRESENT, SUGGEST RECOLLECTION (A)  Final   Report Status 05/26/2017 FINAL  Final  C difficile quick screen w PCR reflex     Status: None   Collection Time: 05/25/17  3:54 AM  Result Value Ref Range Status   C Diff antigen NEGATIVE NEGATIVE Final   C Diff toxin NEGATIVE NEGATIVE Final   C Diff interpretation No C. difficile detected.  Final         Radiology Studies: No results found.      Scheduled Meds: . chlorhexidine  15 mL Mouth Rinse BID  . clopidogrel  75 mg Oral Daily  . donepezil  10 mg Oral QHS  . enoxaparin (LOVENOX) injection  40 mg Subcutaneous Q24H  . escitalopram  20 mg Oral Daily  . fludrocortisone  0.1 mg Oral TID  . magnesium oxide  400 mg Oral BID  . mouth rinse  15 mL Mouth Rinse q12n4p  . memantine  10 mg Oral BID  . midodrine  10 mg Oral TID WC  . mirabegron ER  50 mg Oral Daily  . multivitamin with minerals  1 tablet Oral Daily  . [START ON 05/28/2017] pregabalin  75 mg Oral Daily   Continuous Infusions: . sodium chloride 75 mL/hr at 05/27/17 1527     LOS: 2 days        Georgette Shell, MD [facility]  If 7PM-7AM, please contact night-coverage [email or web address] Password [facility] 05/27/2017, 5:31 PM

## 2017-05-27 NOTE — Progress Notes (Signed)
Spoke with pt's husband concerning discharge plan. Mr. Tomkiewicz states pt is going to SNF. CSW will follow up, if he change to [facility] CM will follow.

## 2017-05-27 NOTE — Clinical Social Work Placement (Signed)
   CLINICAL SOCIAL WORK PLACEMENT  NOTE  Date:  05/27/2017  Patient Details  Name: Barbara Flowers MRN: [id number] Date of Birth: [birth year]  Clinical Social Work is seeking post-discharge placement for this patient at the [place] level of care (*CSW will initial, date and re-position this form in  chart as items are completed):  Yes   Patient/family provided with [place] Work Department's list of facilities offering this level of care within the geographic area requested by the patient (or if unable, by the patient's family).  Yes   Patient/family informed of their freedom to choose among providers that offer the needed level of care, that participate in Medicare, Medicaid or managed care program needed by the patient, have an available bed and are willing to accept the patient.  Yes   Patient/family informed of [place]'s ownership interest in [facility] and [facility], as well as of the fact that they are under no obligation to receive care at these facilities.  PASRR submitted to EDS on       PASRR number received on       Existing PASRR number confirmed on 05/27/17     FL2 transmitted to all facilities in geographic area requested by pt/family on 05/27/17     FL2 transmitted to all facilities within larger geographic area on       Patient informed that his/her managed care company has contracts with or will negotiate with certain facilities, including the following:            Patient/family informed of bed offers received.  Patient chooses bed at       Physician recommends and patient chooses bed at      Patient to be transferred to   on  .  Patient to be transferred to facility by       Patient family notified on   of transfer.  Name of family member notified:        PHYSICIAN       Additional Comment:    _______________________________________________ Burnis Medin, LCSW 05/27/2017, 2:27 PM

## 2017-05-27 NOTE — Discharge Summary (Deleted)
Physician Discharge Summary  Madysun Thall KYH:[id number] DOB: Jul 25, 1951 DOA: 05/24/2017  PCP: Raelyn Number, MD  Admit date: 05/24/2017 Discharge date: 05/27/2017  Admitted From:home Disposition:  home  Recommendations for Outpatient Follow-up:  1. Follow up with PCP in 1-2 weeks 2. Please obtain BMP/CBC in one week  Home Health: Equipment/Devices:  Discharge Condition:stable CODE STATUS:full Diet recommendation: Heart Healthy / Carb Modified / Regular / Dysphagia   Brief/Interim Summary55 yo female with history of cva parkinsons admitted with diarrhea due to gastroenteritis which has been resolved.cdiff negative.patients discharge is being held at this time due to change in mental status,was very lethargic.i will check her ammonia level,urine ananlysis and give her slow iv hydration.2)waiting to be seenby speech therapy for increasing dysphagia.  Discharge Diagnoses:  Principal Problem:   Hypokalemia Active Problems:   History of CVA with residual deficit left side   Dementia   Chronic orthostatic hypotension   Diarrhea    Discharge Instructions  follow up with pcp Allergies as of 05/27/2017      Reactions   Gabapentin Other (See Comments)   seizure   Tramadol Other (See Comments)   Brings on Seizures       Medication List    STOP taking these medications   loperamide 2 MG tablet Commonly known as:  IMODIUM A-D     TAKE these medications   chlorhexidine 0.12 % solution Commonly known as:  PERIDEX 15 mLs by Mouth Rinse route 2 (two) times daily.   clopidogrel 75 MG tablet Commonly known as:  PLAVIX Take 75 mg by mouth daily.   donepezil 10 MG tablet Commonly known as:  ARICEPT Take 10 mg by mouth at bedtime.   escitalopram 20 MG tablet Commonly known as:  LEXAPRO Take 20 mg by mouth daily.   fludrocortisone 0.1 MG tablet Commonly known as:  FLORINEF Take 0.1 mg by mouth 3 (three) times daily.   magnesium oxide 400 (241.3 Mg) MG tablet Commonly  known as:  MAG-OX Take 1 tablet (400 mg total) by mouth 2 (two) times daily.   memantine 10 MG tablet Commonly known as:  NAMENDA Take 10 mg by mouth 2 (two) times daily.   midodrine 10 MG tablet Commonly known as:  PROAMATINE Take 10 mg by mouth 3 (three) times daily.   multivitamin with minerals tablet Take 1 tablet by mouth daily.   MYRBETRIQ 50 MG Tb24 tablet Generic drug:  mirabegron ER Take 50 mg by mouth daily.   nortriptyline 25 MG capsule Commonly known as:  PAMELOR Take 25 mg by mouth 2 (two) times daily.   pregabalin 75 MG capsule Commonly known as:  LYRICA Take 75 mg by mouth 2 (two) times daily.            Discharge Care Instructions        Start     Ordered   05/27/17 0000  chlorhexidine (PERIDEX) 0.12 % solution  2 times daily     05/27/17 1115   05/27/17 0000  magnesium oxide (MAG-OX) 400 (241.3 Mg) MG tablet  2 times daily     05/27/17 1115      Allergies  Allergen Reactions  . Gabapentin Other (See Comments)    seizure  . Tramadol Other (See Comments)    Brings on Seizures     Consultations:   Procedures/Studies: Dg Chest 2 View  Result Date: 05/24/2017 CLINICAL DATA:  Diarrhea for 1 week and generalized weakness for 3 days. EXAM: CHEST  2 VIEW COMPARISON:  June 09, 2016 FINDINGS: The heart size and mediastinal contours are stable. There is no focal infiltrate, pulmonary edema, or pleural effusion. Surgical clips are identified in the left axilla unchanged. Degenerative joint changes of bilateral shoulders are unchanged. Calcification of left breast is unchanged. IMPRESSION: No active cardiopulmonary disease. Electronically Signed   By: Abelardo Diesel M.D.   On: 05/24/2017 17:23   Ct Head Wo Contrast  Result Date: 05/24/2017 CLINICAL DATA:  Altered mental status. EXAM: CT HEAD WITHOUT CONTRAST TECHNIQUE: Contiguous axial images were obtained from the base of the skull through the vertex without intravenous contrast. COMPARISON:  Head CT  dated 04/29/2016. FINDINGS: Brain: Interval mild patchy low density in the pons. Diffusely enlarged ventricles and subarachnoid spaces. Patchy white matter low density in both cerebral hemispheres. Stable old left thalamic lacunar infarct. Stable encephalomalacia in calcifications underneath a right posterior parietal craniotomy defect. No intracranial hemorrhage, mass lesion or CT evidence of acute infarction. Vascular: No hyperdense vessel or unexpected calcification. Skull: Stable right parietal post craniotomy changes. Sinuses/Orbits: Status post bilateral cataract extraction and right lens implant. Unremarkable paranasal sinuses. Other: None. IMPRESSION: 1. No acute abnormality. 2. Stable moderate diffuse cerebral and cerebellar atrophy. 3. Stable extensive small vessel white matter ischemic changes in both cerebral hemispheres, old left thalamic lacunar infarct and right posterior watershed encephalomalacia and parenchymal calcifications beneath a craniotomy defect. Electronically Signed   By: Claudie Revering M.D.   On: 05/24/2017 17:44    (Echo, Carotid, EGD, Colonoscopy, ERCP)    Subjective:   Discharge Exam: Vitals:   05/27/17 0516 05/27/17 0809  BP: (!) 155/86 (!) 151/73  Pulse: 70 65  Resp: 16 18  Temp: 97.6 F (36.4 C) 98.1 F (36.7 C)  SpO2: 97% 100%   Vitals:   05/26/17 1404 05/26/17 2111 05/27/17 0516 05/27/17 0809  BP: (!) 160/70 (!) 161/84 (!) 155/86 (!) 151/73  Pulse: 70 72 70 65  Resp: 18 18 16 18   Temp: 98.5 F (36.9 C) 99.1 F (37.3 C) 97.6 F (36.4 C) 98.1 F (36.7 C)  TempSrc: Oral Oral Oral Oral  SpO2: 100% 100% 97% 100%  Weight:      Height:        General: Pt is alert, awake, not in acute distress Cardiovascular: RRR, S1/S2 +, no rubs, no gallops Respiratory: CTA bilaterally, no wheezing, no rhonchi Abdominal: Soft, NT, ND, bowel sounds + Extremities: no edema, no cyanosis    The results of significant diagnostics from this hospitalization (including  imaging, microbiology, ancillary and laboratory) are listed below for reference.     Microbiology: Recent Results (from the past 240 hour(s))  Urine culture     Status: Abnormal   Collection Time: 05/24/17  5:54 PM  Result Value Ref Range Status   Specimen Description URINE, CATHETERIZED  Final   Special Requests NONE  Final   Culture MULTIPLE SPECIES PRESENT, SUGGEST RECOLLECTION (A)  Final   Report Status 05/26/2017 FINAL  Final  C difficile quick screen w PCR reflex     Status: None   Collection Time: 05/25/17  3:54 AM  Result Value Ref Range Status   C Diff antigen NEGATIVE NEGATIVE Final   C Diff toxin NEGATIVE NEGATIVE Final   C Diff interpretation No C. difficile detected.  Final     Labs: BNP (last 3 results) No results for input(s): BNP in the last 8760 hours. Basic Metabolic Panel:  Recent Labs Lab 05/24/17 1540 05/24/17 2224 05/25/17 0357 05/26/17 0407 05/27/17 0410  NA 145  --  145 140 144  K 2.4*  --  3.0* 3.3* 3.9  CL 105  --  109 108 111  CO2 28  --  27 27 28   GLUCOSE 85  --  84 80 87  BUN 21*  --  19 10 6   CREATININE 0.93  --  0.69 0.51 0.50  CALCIUM 8.7*  --  8.4* 8.1* 8.3*  MG  --  1.5*  --  1.7  --    Liver Function Tests:  Recent Labs Lab 05/24/17 1540  AST 18  ALT 10*  ALKPHOS 114  BILITOT 0.8  PROT 7.1  ALBUMIN 3.6    Recent Labs Lab 05/24/17 1540  LIPASE 34   No results for input(s): AMMONIA in the last 168 hours. CBC:  Recent Labs Lab 05/24/17 1540 05/26/17 0407  WBC 10.5 8.6  NEUTROABS 7.8*  --   HGB 12.8 10.8*  HCT 39.1 32.9*  MCV 89.7 89.4  PLT 194 168   Cardiac Enzymes: No results for input(s): CKTOTAL, CKMB, CKMBINDEX, TROPONINI in the last 168 hours. BNP: Invalid input(s): POCBNP CBG: No results for input(s): GLUCAP in the last 168 hours. D-Dimer No results for input(s): DDIMER in the last 72 hours. Hgb A1c No results for input(s): HGBA1C in the last 72 hours. Lipid Profile No results for input(s):  CHOL, HDL, LDLCALC, TRIG, CHOLHDL, LDLDIRECT in the last 72 hours. Thyroid function studies  Recent Labs  05/25/17 0357  TSH 1.750   Anemia work up No results for input(s): VITAMINB12, FOLATE, FERRITIN, TIBC, IRON, RETICCTPCT in the last 72 hours. Urinalysis    Component Value Date/Time   COLORURINE YELLOW 05/24/2017 1754   APPEARANCEUR HAZY (A) 05/24/2017 1754   LABSPEC 1.023 05/24/2017 1754   PHURINE 5.0 05/24/2017 1754   GLUCOSEU NEGATIVE 05/24/2017 1754   HGBUR SMALL (A) 05/24/2017 1754   BILIRUBINUR NEGATIVE 05/24/2017 1754   KETONESUR 5 (A) 05/24/2017 1754   PROTEINUR 30 (A) 05/24/2017 1754   NITRITE NEGATIVE 05/24/2017 1754   LEUKOCYTESUR SMALL (A) 05/24/2017 1754   Sepsis Labs Invalid input(s): PROCALCITONIN,  WBC,  LACTICIDVEN Microbiology Recent Results (from the past 240 hour(s))  Urine culture     Status: Abnormal   Collection Time: 05/24/17  5:54 PM  Result Value Ref Range Status   Specimen Description URINE, CATHETERIZED  Final   Special Requests NONE  Final   Culture MULTIPLE SPECIES PRESENT, SUGGEST RECOLLECTION (A)  Final   Report Status 05/26/2017 FINAL  Final  C difficile quick screen w PCR reflex     Status: None   Collection Time: 05/25/17  3:54 AM  Result Value Ref Range Status   C Diff antigen NEGATIVE NEGATIVE Final   C Diff toxin NEGATIVE NEGATIVE Final   C Diff interpretation No C. difficile detected.  Final     Time coordinating discharge: Over 30 minutes  SIGNED:   Georgette Shell, MD  [facility] 05/27/2017, 11:18 AM Pager   If 7PM-7AM, please contact night-coverage [email or web address] Password [email or web address]

## 2017-05-27 NOTE — Evaluation (Signed)
SLP Cancellation Note  Patient Details Name: Barbara Flowers MRN: [id number] DOB: Jun 21, 1951   Cancelled treatment:       Reason Eval/Treat Not Completed: Fatigue/lethargy limiting ability to participate   Luanna Salk, [facility] SLP [phone]

## 2017-05-27 NOTE — NC FL2 (Signed)
[place] LEVEL OF CARE SCREENING TOOL     IDENTIFICATION  Patient Name: Barbara Flowers Birthdate: 1950/12/12 Sex: female Admission Date (Current Location): 05/24/2017  [facility] and [place] Number:  Herbalist and Address:  [facility],  [place], [place]      Provider Number: [id number]  Attending Physician Name and Address:  Georgette Shell, MD  Relative Name and Phone Number:       Current Level of Care: Hospital Recommended Level of Care: [place] Prior Approval Number:    Date Approved/Denied:   PASRR Number: [id number]  Discharge Plan: SNF    Current Diagnoses: Patient Active Problem List   Diagnosis Date Noted  . Diarrhea 05/24/2017  . Hypokalemia 05/24/2017  . TIA (transient ischemic attack) 08/31/2015  . History of CVA with residual deficit left side 08/31/2015  . Hypothyroidism 08/31/2015  . OSA on CPAP 08/31/2015  . Dementia 08/31/2015  . Chronic orthostatic hypotension 08/31/2015  . OAB (overactive bladder) 08/31/2015  . 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  . Peripheral neuropathy 08/31/2015  . Avascular necrosis of right femoral head ([place]) 08/31/2015  . Left carotid bruit 08/31/2015  . Anemia 08/31/2015    Orientation RESPIRATION BLADDER Height & Weight     Self, Place  Normal Incontinent Weight: 150 lb 2.1 oz (68.1 kg) Height:  5\' 8"  (172.7 cm)  BEHAVIORAL SYMPTOMS/MOOD NEUROLOGICAL BOWEL NUTRITION STATUS      Incontinent Diet (regular)  AMBULATORY STATUS COMMUNICATION OF NEEDS Skin   Total Care Verbally Normal                       Personal Care Assistance Level of Assistance  Bathing, Dressing, Feeding Bathing Assistance: Maximum assistance Feeding assistance: Limited assistance Dressing Assistance: Maximum assistance     Functional Limitations Info             SPECIAL CARE FACTORS FREQUENCY  PT (By licensed PT), OT (By licensed OT)     PT  Frequency: 5x OT Frequency: 5x            Contractures Contractures Info: Not present    Additional Factors Info  Code Status, Allergies Code Status Info: Full Code Allergies Info: Gabapentin,Tramadol           Current Medications (05/27/2017):  This is the current hospital active medication list Current Facility-Administered Medications  Medication Dose Route Frequency Provider Last Rate Last Dose  . 0.9 % NaCl with KCl 40 mEq / L  infusion   Intravenous Continuous Georgette Shell, MD 100 mL/hr at 05/27/17 1201 100 mL/hr at 05/27/17 1201  . acetaminophen (TYLENOL) tablet 650 mg  650 mg Oral Q6H PRN Etta Quill, DO       Or  . acetaminophen (TYLENOL) suppository 650 mg  650 mg Rectal Q6H PRN Etta Quill, DO      . chlorhexidine (PERIDEX) 0.12 % solution 15 mL  15 mL Mouth Rinse BID Jennette Kettle M, DO   15 mL at 05/27/17 0958  . clopidogrel (PLAVIX) tablet 75 mg  75 mg Oral Daily Etta Quill, DO   75 mg at 05/27/17 0957  . donepezil (ARICEPT) tablet 10 mg  10 mg Oral QHS Jennette Kettle M, DO   10 mg at 05/26/17 2209  . enoxaparin (LOVENOX) injection 40 mg  40 mg Subcutaneous Q24H Jennette Kettle M, DO   40 mg at 05/26/17 2210  . escitalopram (LEXAPRO) tablet 20  mg  20 mg Oral Daily Etta Quill, DO   20 mg at 05/27/17 3382  . fludrocortisone (FLORINEF) tablet 0.1 mg  0.1 mg Oral TID Etta Quill, DO   0.1 mg at 05/27/17 1000  . hydrALAZINE (APRESOLINE) injection 5 mg  5 mg Intravenous Q4H PRN Theodis Blaze, MD      . magnesium oxide (MAG-OX) tablet 400 mg  400 mg Oral BID Georgette Shell, MD   400 mg at 05/27/17 0956  . MEDLINE mouth rinse  15 mL Mouth Rinse q12n4p Jennette Kettle M, DO   15 mL at 05/26/17 1327  . memantine (NAMENDA) tablet 10 mg  10 mg Oral BID Jennette Kettle M, DO   10 mg at 05/27/17 5053  . midodrine (PROAMATINE) tablet 10 mg  10 mg Oral TID WC Jennette Kettle M, DO   10 mg at 05/27/17 1115  . mirabegron ER (MYRBETRIQ) tablet 50  mg  50 mg Oral Daily Jennette Kettle M, DO   50 mg at 05/27/17 9767  . multivitamin with minerals tablet 1 tablet  1 tablet Oral Daily Jennette Kettle M, DO   1 tablet at 05/27/17 0957  . nortriptyline (PAMELOR) capsule 25 mg  25 mg Oral BID Jennette Kettle M, DO   25 mg at 05/27/17 3419  . ondansetron (ZOFRAN) tablet 4 mg  4 mg Oral Q6H PRN Etta Quill, DO       Or  . ondansetron [facility]) injection 4 mg  4 mg Intravenous Q6H PRN Etta Quill, DO      . pregabalin (LYRICA) capsule 75 mg  75 mg Oral BID Jennette Kettle M, DO   75 mg at 05/27/17 3790     Discharge Medications: Please see discharge summary for a list of discharge medications.  Relevant Imaging Results:  Relevant Lab Results:   Additional Information SSN [id number]  Burnis Medin, LCSW

## 2017-05-28 ENCOUNTER — Inpatient Hospital Stay (HOSPITAL_COMMUNITY): Payer: Medicare Other

## 2017-05-28 LAB — URINE CULTURE

## 2017-05-28 LAB — AMMONIA: Ammonia: 16 umol/L (ref 9–35)

## 2017-05-28 NOTE — Discharge Summary (Signed)
Physician Discharge Summary  Emi Lymon MWN:[id number] DOB: 1951/02/09 DOA: 05/24/2017  PCP: Raelyn Number, MD  Admit date: 05/24/2017 Discharge date: 05/28/2017  Admitted From: home Disposition:  home  Recommendations for Outpatient Follow-up:  1. Follow up with PCP in 1-2 weeks 2. Please obtain BMP/CBC in one week 3. Follow up results of esophagogram   Home Health: Equipment/Devices:  Discharge Condition: CODE STATUS: Diet recommendation:   Brief/Interim Summary:66 year old female with history of stroke and Parkinson's disease and dysphagia admitted with nausea vomiting and diarrhea. Her symptoms have been resolved here she was seen by speech therapy and recommended a esophagogram which was done today.  Discharge Diagnoses:  Principal Problem:   Hypokalemia Active Problems:   History of CVA with residual deficit left side   Dementia   Chronic orthostatic hypotension   Diarrhea    Discharge Instructions   Allergies as of 05/28/2017      Reactions   Gabapentin Other (See Comments)   seizure   Tramadol Other (See Comments)   Brings on Seizures       Medication List    STOP taking these medications   loperamide 2 MG tablet Commonly known as:  IMODIUM A-D     TAKE these medications   chlorhexidine 0.12 % solution Commonly known as:  PERIDEX 15 mLs by Mouth Rinse route 2 (two) times daily.   clopidogrel 75 MG tablet Commonly known as:  PLAVIX Take 75 mg by mouth daily.   donepezil 10 MG tablet Commonly known as:  ARICEPT Take 10 mg by mouth at bedtime.   escitalopram 20 MG tablet Commonly known as:  LEXAPRO Take 20 mg by mouth daily.   fludrocortisone 0.1 MG tablet Commonly known as:  FLORINEF Take 0.1 mg by mouth 3 (three) times daily.   magnesium oxide 400 (241.3 Mg) MG tablet Commonly known as:  MAG-OX Take 1 tablet (400 mg total) by mouth 2 (two) times daily.   memantine 10 MG tablet Commonly known as:  NAMENDA Take 10 mg by mouth 2 (two)  times daily.   midodrine 10 MG tablet Commonly known as:  PROAMATINE Take 10 mg by mouth 3 (three) times daily.   multivitamin with minerals tablet Take 1 tablet by mouth daily.   MYRBETRIQ 50 MG Tb24 tablet Generic drug:  mirabegron ER Take 50 mg by mouth daily.   nortriptyline 25 MG capsule Commonly known as:  PAMELOR Take 25 mg by mouth 2 (two) times daily.   pregabalin 75 MG capsule Commonly known as:  LYRICA Take 75 mg by mouth 2 (two) times daily.            Discharge Care Instructions        Start     Ordered   05/27/17 0000  chlorhexidine (PERIDEX) 0.12 % solution  2 times daily     05/27/17 1115   05/27/17 0000  magnesium oxide (MAG-OX) 400 (241.3 Mg) MG tablet  2 times daily     05/27/17 1115     Contact information for after-discharge care    Destination    [facility] .   Specialty:  Skilled Nursing Facility Contact information: [place] [phone]             Allergies  Allergen Reactions  . Gabapentin Other (See Comments)    seizure  . Tramadol Other (See Comments)    Brings on Seizures     Consultation none   Procedures/Studies: Dg  Chest 2 View  Result Date: 05/24/2017 CLINICAL DATA:  Diarrhea for 1 week and generalized weakness for 3 days. EXAM: CHEST  2 VIEW COMPARISON:  June 09, 2016 FINDINGS: The heart size and mediastinal contours are stable. There is no focal infiltrate, pulmonary edema, or pleural effusion. Surgical clips are identified in the left axilla unchanged. Degenerative joint changes of bilateral shoulders are unchanged. Calcification of left breast is unchanged. IMPRESSION: No active cardiopulmonary disease. Electronically Signed   By: Abelardo Diesel M.D.   On: 05/24/2017 17:23   Ct Head Wo Contrast  Result Date: 05/24/2017 CLINICAL DATA:  Altered mental status. EXAM: CT HEAD WITHOUT CONTRAST TECHNIQUE: Contiguous axial images were obtained from  the base of the skull through the vertex without intravenous contrast. COMPARISON:  Head CT dated 04/29/2016. FINDINGS: Brain: Interval mild patchy low density in the pons. Diffusely enlarged ventricles and subarachnoid spaces. Patchy white matter low density in both cerebral hemispheres. Stable old left thalamic lacunar infarct. Stable encephalomalacia in calcifications underneath a right posterior parietal craniotomy defect. No intracranial hemorrhage, mass lesion or CT evidence of acute infarction. Vascular: No hyperdense vessel or unexpected calcification. Skull: Stable right parietal post craniotomy changes. Sinuses/Orbits: Status post bilateral cataract extraction and right lens implant. Unremarkable paranasal sinuses. Other: None. IMPRESSION: 1. No acute abnormality. 2. Stable moderate diffuse cerebral and cerebellar atrophy. 3. Stable extensive small vessel white matter ischemic changes in both cerebral hemispheres, old left thalamic lacunar infarct and right posterior watershed encephalomalacia and parenchymal calcifications beneath a craniotomy defect. Electronically Signed   By: Claudie Revering M.D.   On: 05/24/2017 17:44   Dg Esophagus  Result Date: 05/28/2017 CLINICAL DATA:  66 year old female admitted with diarrhea and weakness. Possible esophageal dysmotility on speech pathology evaluation. "Chest pain after swallowing". EXAM: ESOPHOGRAM/BARIUM SWALLOW TECHNIQUE: Single contrast examination was performed using thin barium FLUOROSCOPY TIME:  Fluoroscopy Time:  2 minutes 12 seconds Radiation Exposure Index (if provided by the fluoroscopic device): 31.4 mGy Number of Acquired Spot Images: 0 COMPARISON:  Chest radiographs 05/24/2017. FINDINGS: Due to limited mobility, the patient was positioned LPO on the fluoroscopy table. The head of the table with elevated to 20 degrees. With coaching over a period of several minutes, initially only very minute swallows of thin barium were taken. Still, these barium  swallows were seen to transit to the stomach. Presbyesophagus including tertiary contractions in the mid and distal esophagus occurred. Ultimately more sizable swallow of barium occurred (series 4). Penetration of barium was noted with this swallow (series 4, image 58), but no aspiration. Decreased motility in the proximal third of the thoracic esophagus was noted, with tertiary contractions in the middle and distal third, but ultimately all of the barium reached the stomach. There is evidence of a small gastric hiatal hernia. IMPRESSION: 1. Limited due to predominately small swallows of barium, and decreased patient mobility. 2. No esophageal obstruction. 3. Presbyesophagus, with decreased proximal esophageal motility, and tertiary contractions in the mid and distal esophagus. 4. Possible small gastric hiatal hernia, significance is doubtful. Electronically Signed   By: Genevie Ann M.D.   On: 05/28/2017 12:29    (Echo, Carotid, EGD, Colonoscopy, ERCP)    Subjective:   Discharge Exam: Vitals:   05/28/17 0431 05/28/17 1341  BP: 140/73 122/66  Pulse: 70 74  Resp: 18   Temp: 98.6 F (37 C) 99.1 F (37.3 C)  SpO2: 93% 98%   Vitals:   05/27/17 1640 05/27/17 2156 05/28/17 0431 05/28/17 1341  BP: (!) 115/59 Marland Kitchen)  156/79 140/73 122/66  Pulse: 68 71 70 74  Resp: 18  18   Temp: 98.3 F (36.8 C) 98.7 F (37.1 C) 98.6 F (37 C) 99.1 F (37.3 C)  TempSrc: Oral Oral Oral Oral  SpO2: 99% 94% 93% 98%  Weight:      Height:        General: Pt is alert, awake, not in acute distress Cardiovascular: RRR, S1/S2 +, no rubs, no gallops Respiratory: CTA bilaterally, no wheezing, no rhonchi Abdominal: Soft, NT, ND, bowel sounds + Extremities: no edema, no cyanosis    The results of significant diagnostics from this hospitalization (including imaging, microbiology, ancillary and laboratory) are listed below for reference.     Microbiology: Recent Results (from the past 240 hour(s))  Urine culture      Status: Abnormal   Collection Time: 05/24/17  5:54 PM  Result Value Ref Range Status   Specimen Description URINE, CATHETERIZED  Final   Special Requests NONE  Final   Culture MULTIPLE SPECIES PRESENT, SUGGEST RECOLLECTION (A)  Final   Report Status 05/26/2017 FINAL  Final  C difficile quick screen w PCR reflex     Status: None   Collection Time: 05/25/17  3:54 AM  Result Value Ref Range Status   C Diff antigen NEGATIVE NEGATIVE Final   C Diff toxin NEGATIVE NEGATIVE Final   C Diff interpretation No C. difficile detected.  Final  Culture, Urine     Status: Abnormal   Collection Time: 05/27/17  3:36 PM  Result Value Ref Range Status   Specimen Description URINE, CLEAN CATCH  Final   Special Requests NONE  Final   Culture (A)  Final    <10,000 COLONIES/mL INSIGNIFICANT GROWTH Performed at [facility], [address], [place], [place] [address]    Report Status 05/28/2017 FINAL  Final     Labs: BNP (last 3 results) No results for input(s): BNP in the last 8760 hours. Basic Metabolic Panel:  Recent Labs Lab 05/24/17 1540 05/24/17 2224 05/25/17 0357 05/26/17 0407 05/27/17 0410  NA 145  --  145 140 144  K 2.4*  --  3.0* 3.3* 3.9  CL 105  --  109 108 111  CO2 28  --  27 27 28   GLUCOSE 85  --  84 80 87  BUN 21*  --  19 10 6   CREATININE 0.93  --  0.69 0.51 0.50  CALCIUM 8.7*  --  8.4* 8.1* 8.3*  MG  --  1.5*  --  1.7  --    Liver Function Tests:  Recent Labs Lab 05/24/17 1540  AST 18  ALT 10*  ALKPHOS 114  BILITOT 0.8  PROT 7.1  ALBUMIN 3.6    Recent Labs Lab 05/24/17 1540  LIPASE 34    Recent Labs Lab 05/28/17 0422  AMMONIA 16   CBC:  Recent Labs Lab 05/24/17 1540 05/26/17 0407  WBC 10.5 8.6  NEUTROABS 7.8*  --   HGB 12.8 10.8*  HCT 39.1 32.9*  MCV 89.7 89.4  PLT 194 168   Cardiac Enzymes: No results for input(s): CKTOTAL, CKMB, CKMBINDEX, TROPONINI in the last 168 hours. BNP: Invalid input(s): POCBNP CBG: No results for  input(s): GLUCAP in the last 168 hours. D-Dimer No results for input(s): DDIMER in the last 72 hours. Hgb A1c No results for input(s): HGBA1C in the last 72 hours. Lipid Profile No results for input(s): CHOL, HDL, LDLCALC, TRIG, CHOLHDL, LDLDIRECT in the last 72 hours. Thyroid  function studies No results for input(s): TSH, T4TOTAL, T3FREE, THYROIDAB in the last 72 hours.  Invalid input(s): FREET3 Anemia work up No results for input(s): VITAMINB12, FOLATE, FERRITIN, TIBC, IRON, RETICCTPCT in the last 72 hours. Urinalysis    Component Value Date/Time   COLORURINE YELLOW 05/24/2017 1754   APPEARANCEUR HAZY (A) 05/24/2017 1754   LABSPEC 1.023 05/24/2017 1754   PHURINE 5.0 05/24/2017 1754   GLUCOSEU NEGATIVE 05/24/2017 1754   HGBUR SMALL (A) 05/24/2017 1754   BILIRUBINUR NEGATIVE 05/24/2017 1754   KETONESUR 5 (A) 05/24/2017 1754   PROTEINUR 30 (A) 05/24/2017 1754   NITRITE NEGATIVE 05/24/2017 1754   LEUKOCYTESUR SMALL (A) 05/24/2017 1754   Sepsis Labs Invalid input(s): PROCALCITONIN,  WBC,  LACTICIDVEN Microbiology Recent Results (from the past 240 hour(s))  Urine culture     Status: Abnormal   Collection Time: 05/24/17  5:54 PM  Result Value Ref Range Status   Specimen Description URINE, CATHETERIZED  Final   Special Requests NONE  Final   Culture MULTIPLE SPECIES PRESENT, SUGGEST RECOLLECTION (A)  Final   Report Status 05/26/2017 FINAL  Final  C difficile quick screen w PCR reflex     Status: None   Collection Time: 05/25/17  3:54 AM  Result Value Ref Range Status   C Diff antigen NEGATIVE NEGATIVE Final   C Diff toxin NEGATIVE NEGATIVE Final   C Diff interpretation No C. difficile detected.  Final  Culture, Urine     Status: Abnormal   Collection Time: 05/27/17  3:36 PM  Result Value Ref Range Status   Specimen Description URINE, CLEAN CATCH  Final   Special Requests NONE  Final   Culture (A)  Final    <10,000 COLONIES/mL INSIGNIFICANT GROWTH Performed at [facility], [address] [place], [place] [address]    Report Status 05/28/2017 FINAL  Final     Time coordinating discharge: Over 30 minutes  SIGNED:   Georgette Shell, MD  [facility] 05/28/2017, 2:46 PM  If 7PM-7AM, please contact night-coverage [email or web address] Password [email or web address]

## 2017-05-28 NOTE — Progress Notes (Signed)
Pt selected [facility] for rehab at [place]. Selected facility in [place] and confirmed bed availability with admissions.   Will follow and assist with transfer to SNF when pt stable for DC. Updated attending.  Sharren Bridge, MSW, LCSW Clinical Social Work 05/28/2017 [phone]

## 2017-05-28 NOTE — Evaluation (Signed)
Clinical/Bedside Swallow Evaluation Patient Details  Name: Barbara Flowers MRN: [id number] Date of Birth: 1950-11-24  Today's Date: 05/28/2017 Time: SLP Start Time (ACUTE ONLY): 0930 SLP Stop Time (ACUTE ONLY): 0950 SLP Time Calculation (min) (ACUTE ONLY): 20 min  Past Medical History:  Past Medical History:  Diagnosis Date  . Anemia   . Anemia   . Anxiety disorder   . Blood transfusion without reported diagnosis   . Brain cancer [facility])    s/p sterotactic radio surgery  . Brain tumor ([place])   . Breast cancer ([place])   . Cancer ([place]) 1990   L breast  . H/O bone marrow transplant ([place])   . Hypothyroidism   . IBS (irritable bowel syndrome)   . MDD (major depressive disorder)   . Memory loss   . Obesity   . Orthostatic hypotension   . Seizure ([place])   . Seizures ([place])   . Stroke [facility]) 2005   hemorrhagic  . Stroke ([place])   . Subdural hematoma ([place])   . Upper brachial plexus paralysis syndrome   . Urinary incontinence    Past Surgical History:  Past Surgical History:  Procedure Laterality Date  . BREAST LUMPECTOMY Left    post chemotherapy and radiation  . CHOLECYSTECTOMY    . CRANIOTOMY FOR HEMISPHERECTOMY TOTAL / PARTIAL Right   . GASTRIC BYPASS  1985  . HAND SURGERY Left 2015  . HAND TENDON SURGERY Left    for brachial plexus injury  . LYMPH NODE BIOPSY     HPI:  66 year old female admitted 9//3/18 with diarrhea and weakness. PMH significant for CVA, BRCA, brain tumor, dementia, parkinson's. CXR negative   Assessment / Plan / Recommendation Clinical Impression  Pt presents with adequate oral motor strength and function. No overt s/s aspiration observed or reported on any consistency. Pt does report chest pain after the swallow, and when belching.  This raises suspicion for esophageal dysmotility. Recommend consideration of a REGULAR barium swallow to evaluate esophageal motility. Recommend continuing with current diet in the interim.    SLP Visit Diagnosis: Dysphagia,  pharyngoesophageal phase (R13.14)    Aspiration Risk  Mild aspiration risk    Diet Recommendation Regular;Thin liquid   Liquid Administration via: Cup;Straw Medication Administration: Whole meds with puree Supervision: Staff to assist with self feeding;Full supervision/cueing for compensatory strategies Compensations: Minimize environmental distractions;Slow rate;Small sips/bites Postural Changes: Seated upright at 90 degrees;Remain upright for at least 30 minutes after po intake    Other  Recommendations Recommended Consults: Consider esophageal assessment Oral Care Recommendations: Oral care QID   Follow up Recommendations None      Frequency and Duration   pending results from regular barium swallow         Prognosis Prognosis for Safe Diet Advancement: Fair Barriers to Reach Goals: Cognitive deficits      Swallow Study   General Date of Onset: 05/24/17 HPI: 66 year old female admitted 9//3/18 with diarrhea and weakness. PMH significant for CVA, BRCA, brain tumor, dementia, parkinson's. CXR negative Type of Study: Bedside Swallow Evaluation Previous Swallow Assessment: 09/01/15 - BSE, recommended regular diet/thin liquids Diet Prior to this Study: Regular;Thin liquids Temperature Spikes Noted: No Respiratory Status: Room air History of Recent Intubation: No Behavior/Cognition: Alert;Cooperative;Pleasant mood Oral Cavity Assessment: Within Functional Limits Oral Care Completed by SLP: No Oral Cavity - Dentition: Adequate natural dentition Vision: Functional for self-feeding Self-Feeding Abilities: Total assist Patient Positioning: Upright in bed Baseline Vocal Quality: Normal Volitional Cough: Strong Volitional Swallow: Able to  elicit    Oral/Motor/Sensory Function Overall Oral Motor/Sensory Function: Within functional limits   Ice Chips Ice chips: Not tested   Thin Liquid Thin Liquid: Within functional limits Presentation: Straw    Nectar Thick Nectar Thick  Liquid: Not tested   Honey Thick Honey Thick Liquid: Not tested   Puree Puree: Within functional limits Presentation: Spoon   Solid   GO   Solid: Within functional limits       Warda Mcqueary B. Quentin Ore [facility], [place] Speech Language Pathologist [phone]  Shonna Chock 05/28/2017,10:01 AM

## 2017-05-28 NOTE — Care Management Important Message (Signed)
Important Message  Patient Details  Name: Barbara Flowers MRN: [id number] Date of Birth: 12/07/1950   Medicare Important Message Given:  Yes    Kerin Salen 05/28/2017, 11:40 AMImportant Message  Patient Details  Name: Barbara Flowers MRN: [id number] Date of Birth: [birth year]   Medicare Important Message Given:  Yes    Kerin Salen 05/28/2017, 11:40 AM

## 2017-05-28 NOTE — Progress Notes (Signed)
PT Cancellation Note  Patient Details Name: Barbara Flowers MRN: [id number] DOB: 04-17-1951   Cancelled Treatment:    Reason Eval/Treat Not Completed: Patient at procedure or test/unavailable. Pt off the floor at x-ray.  Will check back as schedule permits.   Galen Manila 05/28/2017, 1:15 PM

## 2017-05-28 NOTE — Clinical Social Work Placement (Signed)
Pt discharging to [facility] room 103A. Report [phone] All information provided to facility via the [facility].  Pt will transport via [facility] completed medical necessity form and arranged transport.  Pt's husband at bedside - both he and pt agreeable.  See below for placement details  CLINICAL SOCIAL WORK PLACEMENT  NOTE  Date:  05/28/2017  Patient Details  Name: Barbara Flowers MRN: [id number] Date of Birth: [birth year]  Clinical Social Work is seeking post-discharge placement for this patient at the [place] level of care (*CSW will initial, date and re-position this form in  chart as items are completed):  Yes   Patient/family provided with [place] Work Department's list of facilities offering this level of care within the geographic area requested by the patient (or if unable, by the patient's family).  Yes   Patient/family informed of their freedom to choose among providers that offer the needed level of care, that participate in Medicare, Medicaid or managed care program needed by the patient, have an available bed and are willing to accept the patient.  Yes   Patient/family informed of [facility]'s ownership interest in [facility] and [facility], as well as of the fact that they are under no obligation to receive care at these facilities.  PASRR submitted to EDS on       PASRR number received on       Existing PASRR number confirmed on 05/27/17     FL2 transmitted to all facilities in geographic area requested by pt/family on 05/27/17     FL2 transmitted to all facilities within larger geographic area on       Patient informed that his/her managed care company has contracts with or will negotiate with certain facilities, including the following:        Yes   Patient/family informed of bed offers received.  Patient chooses bed at [facility]     Physician recommends and patient chooses bed at  [facility], [place]    Patient to be transferred to [place] [place] on 05/28/17.  Patient to be transferred to facility by [facility]     Patient family notified on 05/28/17 of transfer.  Name of family member notified:  husband Tim     PHYSICIAN       Additional Comment:    _______________________________________________ Nila Nephew, LCSW 05/28/2017, 2:58 PM  [phone]

## 2017-07-12 DIAGNOSIS — R4182 Altered mental status, unspecified: Secondary | ICD-10-CM | POA: Diagnosis present

## 2019-09-16 ENCOUNTER — Emergency Department (HOSPITAL_COMMUNITY): Payer: Medicare Other

## 2019-09-16 ENCOUNTER — Inpatient Hospital Stay (HOSPITAL_COMMUNITY)
Admission: EM | Admit: 2019-09-16 | Discharge: 2019-10-21 | DRG: 853 | Disposition: A | Discharge status: Home Health | Payer: Medicare Other | Attending: Internal Medicine | Admitting: Internal Medicine

## 2019-09-16 ENCOUNTER — Other Ambulatory Visit: Payer: Self-pay

## 2019-09-16 DIAGNOSIS — I5041 Acute combined systolic (congestive) and diastolic (congestive) heart failure: Secondary | ICD-10-CM | POA: Diagnosis not present

## 2019-09-16 DIAGNOSIS — G909 Disorder of the autonomic nervous system, unspecified: Secondary | ICD-10-CM | POA: Diagnosis present

## 2019-09-16 DIAGNOSIS — Z20822 Contact with and (suspected) exposure to covid-19: Secondary | ICD-10-CM | POA: Diagnosis present

## 2019-09-16 DIAGNOSIS — Z0189 Encounter for other specified special examinations: Secondary | ICD-10-CM

## 2019-09-16 DIAGNOSIS — J918 Pleural effusion in other conditions classified elsewhere: Secondary | ICD-10-CM | POA: Diagnosis present

## 2019-09-16 DIAGNOSIS — I69391 Dysphagia following cerebral infarction: Secondary | ICD-10-CM

## 2019-09-16 DIAGNOSIS — R579 Shock, unspecified: Secondary | ICD-10-CM

## 2019-09-16 DIAGNOSIS — E869 Volume depletion, unspecified: Secondary | ICD-10-CM | POA: Diagnosis present

## 2019-09-16 DIAGNOSIS — Z923 Personal history of irradiation: Secondary | ICD-10-CM

## 2019-09-16 DIAGNOSIS — J15212 Pneumonia due to Methicillin resistant Staphylococcus aureus: Secondary | ICD-10-CM | POA: Diagnosis present

## 2019-09-16 DIAGNOSIS — A4902 Methicillin resistant Staphylococcus aureus infection, unspecified site: Secondary | ICD-10-CM | POA: Diagnosis not present

## 2019-09-16 DIAGNOSIS — G4733 Obstructive sleep apnea (adult) (pediatric): Secondary | ICD-10-CM | POA: Diagnosis present

## 2019-09-16 DIAGNOSIS — J9622 Acute and chronic respiratory failure with hypercapnia: Secondary | ICD-10-CM | POA: Diagnosis not present

## 2019-09-16 DIAGNOSIS — J90 Pleural effusion, not elsewhere classified: Secondary | ICD-10-CM | POA: Diagnosis present

## 2019-09-16 DIAGNOSIS — R531 Weakness: Secondary | ICD-10-CM | POA: Diagnosis not present

## 2019-09-16 DIAGNOSIS — D62 Acute posthemorrhagic anemia: Secondary | ICD-10-CM | POA: Diagnosis not present

## 2019-09-16 DIAGNOSIS — F0150 Vascular dementia without behavioral disturbance: Secondary | ICD-10-CM | POA: Diagnosis not present

## 2019-09-16 DIAGNOSIS — R5381 Other malaise: Secondary | ICD-10-CM | POA: Diagnosis present

## 2019-09-16 DIAGNOSIS — Z8249 Family history of ischemic heart disease and other diseases of the circulatory system: Secondary | ICD-10-CM

## 2019-09-16 DIAGNOSIS — L89154 Pressure ulcer of sacral region, stage 4: Secondary | ICD-10-CM

## 2019-09-16 DIAGNOSIS — Z888 Allergy status to other drugs, medicaments and biological substances status: Secondary | ICD-10-CM

## 2019-09-16 DIAGNOSIS — J9690 Respiratory failure, unspecified, unspecified whether with hypoxia or hypercapnia: Secondary | ICD-10-CM

## 2019-09-16 DIAGNOSIS — I4892 Unspecified atrial flutter: Secondary | ICD-10-CM | POA: Diagnosis present

## 2019-09-16 DIAGNOSIS — R7989 Other specified abnormal findings of blood chemistry: Secondary | ICD-10-CM | POA: Diagnosis not present

## 2019-09-16 DIAGNOSIS — Z823 Family history of stroke: Secondary | ICD-10-CM

## 2019-09-16 DIAGNOSIS — Z515 Encounter for palliative care: Secondary | ICD-10-CM | POA: Diagnosis not present

## 2019-09-16 DIAGNOSIS — T502X5A Adverse effect of carbonic-anhydrase inhibitors, benzothiadiazides and other diuretics, initial encounter: Secondary | ICD-10-CM | POA: Diagnosis not present

## 2019-09-16 DIAGNOSIS — F05 Delirium due to known physiological condition: Secondary | ICD-10-CM | POA: Diagnosis present

## 2019-09-16 DIAGNOSIS — E871 Hypo-osmolality and hyponatremia: Secondary | ICD-10-CM | POA: Diagnosis not present

## 2019-09-16 DIAGNOSIS — F0280 Dementia in other diseases classified elsewhere without behavioral disturbance: Secondary | ICD-10-CM | POA: Diagnosis not present

## 2019-09-16 DIAGNOSIS — I5023 Acute on chronic systolic (congestive) heart failure: Secondary | ICD-10-CM | POA: Diagnosis not present

## 2019-09-16 DIAGNOSIS — F0390 Unspecified dementia without behavioral disturbance: Secondary | ICD-10-CM | POA: Diagnosis present

## 2019-09-16 DIAGNOSIS — N19 Unspecified kidney failure: Secondary | ICD-10-CM | POA: Diagnosis present

## 2019-09-16 DIAGNOSIS — I6930 Unspecified sequelae of cerebral infarction: Secondary | ICD-10-CM

## 2019-09-16 DIAGNOSIS — J9809 Other diseases of bronchus, not elsewhere classified: Secondary | ICD-10-CM

## 2019-09-16 DIAGNOSIS — I69354 Hemiplegia and hemiparesis following cerebral infarction affecting left non-dominant side: Secondary | ICD-10-CM

## 2019-09-16 DIAGNOSIS — I5082 Biventricular heart failure: Secondary | ICD-10-CM | POA: Diagnosis present

## 2019-09-16 DIAGNOSIS — G92 Toxic encephalopathy: Secondary | ICD-10-CM | POA: Diagnosis not present

## 2019-09-16 DIAGNOSIS — Z9889 Other specified postprocedural states: Secondary | ICD-10-CM

## 2019-09-16 DIAGNOSIS — M6248 Contracture of muscle, other site: Secondary | ICD-10-CM | POA: Diagnosis present

## 2019-09-16 DIAGNOSIS — I429 Cardiomyopathy, unspecified: Secondary | ICD-10-CM | POA: Diagnosis present

## 2019-09-16 DIAGNOSIS — I471 Supraventricular tachycardia: Secondary | ICD-10-CM | POA: Diagnosis present

## 2019-09-16 DIAGNOSIS — J690 Pneumonitis due to inhalation of food and vomit: Secondary | ICD-10-CM | POA: Diagnosis not present

## 2019-09-16 DIAGNOSIS — Z22322 Carrier or suspected carrier of Methicillin resistant Staphylococcus aureus: Secondary | ICD-10-CM | POA: Diagnosis not present

## 2019-09-16 DIAGNOSIS — G903 Multi-system degeneration of the autonomic nervous system: Secondary | ICD-10-CM | POA: Diagnosis present

## 2019-09-16 DIAGNOSIS — Z4659 Encounter for fitting and adjustment of other gastrointestinal appliance and device: Secondary | ICD-10-CM

## 2019-09-16 DIAGNOSIS — I5021 Acute systolic (congestive) heart failure: Secondary | ICD-10-CM | POA: Diagnosis not present

## 2019-09-16 DIAGNOSIS — R1310 Dysphagia, unspecified: Secondary | ICD-10-CM

## 2019-09-16 DIAGNOSIS — R6889 Other general symptoms and signs: Secondary | ICD-10-CM

## 2019-09-16 DIAGNOSIS — Z7401 Bed confinement status: Secondary | ICD-10-CM

## 2019-09-16 DIAGNOSIS — E8770 Fluid overload, unspecified: Secondary | ICD-10-CM | POA: Diagnosis not present

## 2019-09-16 DIAGNOSIS — J189 Pneumonia, unspecified organism: Secondary | ICD-10-CM | POA: Diagnosis present

## 2019-09-16 DIAGNOSIS — J9621 Acute and chronic respiratory failure with hypoxia: Secondary | ICD-10-CM | POA: Diagnosis present

## 2019-09-16 DIAGNOSIS — K660 Peritoneal adhesions (postprocedural) (postinfection): Secondary | ICD-10-CM | POA: Diagnosis present

## 2019-09-16 DIAGNOSIS — E440 Moderate protein-calorie malnutrition: Secondary | ICD-10-CM | POA: Insufficient documentation

## 2019-09-16 DIAGNOSIS — E878 Other disorders of electrolyte and fluid balance, not elsewhere classified: Secondary | ICD-10-CM | POA: Diagnosis not present

## 2019-09-16 DIAGNOSIS — Z681 Body mass index (BMI) 19 or less, adult: Secondary | ICD-10-CM

## 2019-09-16 DIAGNOSIS — J9601 Acute respiratory failure with hypoxia: Secondary | ICD-10-CM | POA: Diagnosis not present

## 2019-09-16 DIAGNOSIS — Z9481 Bone marrow transplant status: Secondary | ICD-10-CM | POA: Diagnosis not present

## 2019-09-16 DIAGNOSIS — S43005A Unspecified dislocation of left shoulder joint, initial encounter: Secondary | ICD-10-CM

## 2019-09-16 DIAGNOSIS — J9811 Atelectasis: Secondary | ICD-10-CM | POA: Diagnosis present

## 2019-09-16 DIAGNOSIS — R54 Age-related physical debility: Secondary | ICD-10-CM | POA: Diagnosis present

## 2019-09-16 DIAGNOSIS — I5043 Acute on chronic combined systolic (congestive) and diastolic (congestive) heart failure: Secondary | ICD-10-CM | POA: Diagnosis present

## 2019-09-16 DIAGNOSIS — L98429 Non-pressure chronic ulcer of back with unspecified severity: Secondary | ICD-10-CM

## 2019-09-16 DIAGNOSIS — E43 Unspecified severe protein-calorie malnutrition: Secondary | ICD-10-CM | POA: Insufficient documentation

## 2019-09-16 DIAGNOSIS — F419 Anxiety disorder, unspecified: Secondary | ICD-10-CM | POA: Diagnosis present

## 2019-09-16 DIAGNOSIS — A4102 Sepsis due to Methicillin resistant Staphylococcus aureus: Principal | ICD-10-CM | POA: Diagnosis present

## 2019-09-16 DIAGNOSIS — R601 Generalized edema: Secondary | ICD-10-CM | POA: Diagnosis not present

## 2019-09-16 DIAGNOSIS — Z8679 Personal history of other diseases of the circulatory system: Secondary | ICD-10-CM

## 2019-09-16 DIAGNOSIS — I480 Paroxysmal atrial fibrillation: Secondary | ICD-10-CM | POA: Diagnosis present

## 2019-09-16 DIAGNOSIS — R32 Unspecified urinary incontinence: Secondary | ICD-10-CM | POA: Diagnosis present

## 2019-09-16 DIAGNOSIS — Z79899 Other long term (current) drug therapy: Secondary | ICD-10-CM

## 2019-09-16 DIAGNOSIS — R0902 Hypoxemia: Secondary | ICD-10-CM

## 2019-09-16 DIAGNOSIS — R413 Other amnesia: Secondary | ICD-10-CM | POA: Diagnosis present

## 2019-09-16 DIAGNOSIS — I361 Nonrheumatic tricuspid (valve) insufficiency: Secondary | ICD-10-CM | POA: Diagnosis not present

## 2019-09-16 DIAGNOSIS — R0682 Tachypnea, not elsewhere classified: Secondary | ICD-10-CM

## 2019-09-16 DIAGNOSIS — J9611 Chronic respiratory failure with hypoxia: Secondary | ICD-10-CM

## 2019-09-16 DIAGNOSIS — I4891 Unspecified atrial fibrillation: Secondary | ICD-10-CM | POA: Diagnosis not present

## 2019-09-16 DIAGNOSIS — A498 Other bacterial infections of unspecified site: Secondary | ICD-10-CM | POA: Diagnosis not present

## 2019-09-16 DIAGNOSIS — R739 Hyperglycemia, unspecified: Secondary | ICD-10-CM | POA: Diagnosis present

## 2019-09-16 DIAGNOSIS — R2689 Other abnormalities of gait and mobility: Secondary | ICD-10-CM | POA: Diagnosis present

## 2019-09-16 DIAGNOSIS — G20 Parkinson's disease: Secondary | ICD-10-CM | POA: Diagnosis present

## 2019-09-16 DIAGNOSIS — Z9911 Dependence on respirator [ventilator] status: Secondary | ICD-10-CM

## 2019-09-16 DIAGNOSIS — B964 Proteus (mirabilis) (morganii) as the cause of diseases classified elsewhere: Secondary | ICD-10-CM | POA: Diagnosis present

## 2019-09-16 DIAGNOSIS — I255 Ischemic cardiomyopathy: Secondary | ICD-10-CM | POA: Diagnosis not present

## 2019-09-16 DIAGNOSIS — T17500A Unspecified foreign body in bronchus causing asphyxiation, initial encounter: Secondary | ICD-10-CM

## 2019-09-16 DIAGNOSIS — J9600 Acute respiratory failure, unspecified whether with hypoxia or hypercapnia: Secondary | ICD-10-CM

## 2019-09-16 DIAGNOSIS — E870 Hyperosmolality and hypernatremia: Secondary | ICD-10-CM | POA: Diagnosis present

## 2019-09-16 DIAGNOSIS — A4189 Other specified sepsis: Secondary | ICD-10-CM | POA: Diagnosis present

## 2019-09-16 DIAGNOSIS — R1312 Dysphagia, oropharyngeal phase: Secondary | ICD-10-CM | POA: Diagnosis present

## 2019-09-16 DIAGNOSIS — Z7189 Other specified counseling: Secondary | ICD-10-CM

## 2019-09-16 DIAGNOSIS — I951 Orthostatic hypotension: Secondary | ICD-10-CM | POA: Diagnosis present

## 2019-09-16 DIAGNOSIS — G9341 Metabolic encephalopathy: Secondary | ICD-10-CM | POA: Diagnosis not present

## 2019-09-16 DIAGNOSIS — E874 Mixed disorder of acid-base balance: Secondary | ICD-10-CM | POA: Diagnosis present

## 2019-09-16 DIAGNOSIS — E876 Hypokalemia: Secondary | ICD-10-CM | POA: Diagnosis present

## 2019-09-16 DIAGNOSIS — R569 Unspecified convulsions: Secondary | ICD-10-CM | POA: Diagnosis present

## 2019-09-16 DIAGNOSIS — G8114 Spastic hemiplegia affecting left nondominant side: Secondary | ICD-10-CM

## 2019-09-16 DIAGNOSIS — Z9010 Acquired absence of unspecified breast and nipple: Secondary | ICD-10-CM

## 2019-09-16 DIAGNOSIS — Z978 Presence of other specified devices: Secondary | ICD-10-CM

## 2019-09-16 DIAGNOSIS — L8990 Pressure ulcer of unspecified site, unspecified stage: Secondary | ICD-10-CM | POA: Insufficient documentation

## 2019-09-16 DIAGNOSIS — G20A1 Parkinson's disease without dyskinesia, without mention of fluctuations: Secondary | ICD-10-CM | POA: Diagnosis present

## 2019-09-16 DIAGNOSIS — R6520 Severe sepsis without septic shock: Secondary | ICD-10-CM | POA: Diagnosis not present

## 2019-09-16 DIAGNOSIS — I871 Compression of vein: Secondary | ICD-10-CM | POA: Diagnosis not present

## 2019-09-16 DIAGNOSIS — M419 Scoliosis, unspecified: Secondary | ICD-10-CM

## 2019-09-16 DIAGNOSIS — Z993 Dependence on wheelchair: Secondary | ICD-10-CM

## 2019-09-16 DIAGNOSIS — R0602 Shortness of breath: Secondary | ICD-10-CM

## 2019-09-16 DIAGNOSIS — R64 Cachexia: Secondary | ICD-10-CM | POA: Diagnosis present

## 2019-09-16 DIAGNOSIS — R627 Adult failure to thrive: Secondary | ICD-10-CM | POA: Diagnosis not present

## 2019-09-16 DIAGNOSIS — Z853 Personal history of malignant neoplasm of breast: Secondary | ICD-10-CM

## 2019-09-16 DIAGNOSIS — E162 Hypoglycemia, unspecified: Secondary | ICD-10-CM | POA: Diagnosis not present

## 2019-09-16 DIAGNOSIS — E890 Postprocedural hypothyroidism: Secondary | ICD-10-CM | POA: Diagnosis present

## 2019-09-16 DIAGNOSIS — L98421 Non-pressure chronic ulcer of back limited to breakdown of skin: Secondary | ICD-10-CM | POA: Diagnosis not present

## 2019-09-16 DIAGNOSIS — T17590A Other foreign object in bronchus causing asphyxiation, initial encounter: Secondary | ICD-10-CM | POA: Diagnosis not present

## 2019-09-16 DIAGNOSIS — Z9884 Bariatric surgery status: Secondary | ICD-10-CM

## 2019-09-16 DIAGNOSIS — K432 Incisional hernia without obstruction or gangrene: Secondary | ICD-10-CM | POA: Diagnosis present

## 2019-09-16 DIAGNOSIS — Z85841 Personal history of malignant neoplasm of brain: Secondary | ICD-10-CM

## 2019-09-16 HISTORY — DX: Multi-system degeneration of the autonomic nervous system: G90.3

## 2019-09-16 HISTORY — DX: Delirium due to known physiological condition: F05

## 2019-09-16 HISTORY — DX: Cerebrovascular disease, unspecified: I67.9

## 2019-09-16 HISTORY — DX: Unspecified dementia, unspecified severity, without behavioral disturbance, psychotic disturbance, mood disturbance, and anxiety: F03.90

## 2019-09-16 HISTORY — DX: Bariatric surgery status: Z98.84

## 2019-09-16 LAB — URINALYSIS, ROUTINE W REFLEX MICROSCOPIC
Bilirubin Urine: NEGATIVE
Glucose, UA: NEGATIVE mg/dL
Ketones, ur: 5 mg/dL — AB
Leukocytes,Ua: NEGATIVE
Nitrite: NEGATIVE
Protein, ur: NEGATIVE mg/dL
Specific Gravity, Urine: 1.021 (ref 1.005–1.030)
pH: 5 (ref 5.0–8.0)

## 2019-09-16 LAB — COMPREHENSIVE METABOLIC PANEL
ALT: 27 U/L (ref 0–44)
AST: 27 U/L (ref 15–41)
Albumin: 3.1 g/dL — ABNORMAL LOW (ref 3.5–5.0)
Alkaline Phosphatase: 95 U/L (ref 38–126)
Anion gap: 11 (ref 5–15)
BUN: 57 mg/dL — ABNORMAL HIGH (ref 8–23)
CO2: 30 mmol/L (ref 22–32)
Calcium: 9.2 mg/dL (ref 8.9–10.3)
Chloride: 106 mmol/L (ref 98–111)
Creatinine, Ser: 1.14 mg/dL — ABNORMAL HIGH (ref 0.44–1.00)
GFR calc Af Amer: 57 mL/min — ABNORMAL LOW (ref >60)
GFR calc non Af Amer: 49 mL/min — ABNORMAL LOW (ref >60)
Glucose, Bld: 125 mg/dL — ABNORMAL HIGH (ref 70–99)
Potassium: 4.1 mmol/L (ref 3.5–5.1)
Sodium: 147 mmol/L — ABNORMAL HIGH (ref 135–145)
Total Bilirubin: 1.2 mg/dL (ref 0.3–1.2)
Total Protein: 7.8 g/dL (ref 6.5–8.1)

## 2019-09-16 LAB — PROTIME-INR
INR: 1.1 (ref 0.8–1.2)
Prothrombin Time: 14.4 seconds (ref 11.4–15.2)

## 2019-09-16 LAB — RESPIRATORY PANEL BY RT PCR (FLU A&B, COVID)
Influenza A by PCR: NEGATIVE
Influenza B by PCR: NEGATIVE
SARS Coronavirus 2 by RT PCR: NEGATIVE

## 2019-09-16 LAB — LACTIC ACID, PLASMA
Lactic Acid, Venous: 1.6 mmol/L (ref 0.5–1.9)
Lactic Acid, Venous: 1.4 mmol/L (ref 0.5–1.9)

## 2019-09-16 LAB — BRAIN NATRIURETIC PEPTIDE: B Natriuretic Peptide: 117.3 pg/mL — ABNORMAL HIGH (ref 0.0–100.0)

## 2019-09-16 LAB — BLOOD GAS, ARTERIAL
Acid-base deficit: 0.8 mmol/L (ref 0.0–2.0)
Bicarbonate: 23.2 mmol/L (ref 20.0–28.0)
Drawn by: 331471
FIO2: 80
O2 Saturation: 94.6 %
Patient temperature: 98.6
pCO2 arterial: 37.9 mmHg (ref 32.0–48.0)
pH, Arterial: 7.404 (ref 7.350–7.450)
pO2, Arterial: 82 mmHg — ABNORMAL LOW (ref 83.0–108.0)

## 2019-09-16 LAB — CBC WITH DIFFERENTIAL/PLATELET
Abs Immature Granulocytes: 0.35 10*3/uL — ABNORMAL HIGH (ref 0.00–0.07)
Basophils Absolute: 0.1 10*3/uL (ref 0.0–0.1)
Basophils Relative: 1 %
Eosinophils Absolute: 0 10*3/uL (ref 0.0–0.5)
Eosinophils Relative: 0 %
HCT: 41.2 % (ref 36.0–46.0)
Hemoglobin: 12.8 g/dL (ref 12.0–15.0)
Immature Granulocytes: 3 %
Lymphocytes Relative: 16 %
Lymphs Abs: 1.7 10*3/uL (ref 0.7–4.0)
MCH: 30.5 pg (ref 26.0–34.0)
MCHC: 31.1 g/dL (ref 30.0–36.0)
MCV: 98.1 fL (ref 80.0–100.0)
Monocytes Absolute: 0.9 10*3/uL (ref 0.1–1.0)
Monocytes Relative: 8 %
Neutro Abs: 7.5 10*3/uL (ref 1.7–7.7)
Neutrophils Relative %: 72 %
Platelets: 266 10*3/uL (ref 150–400)
RBC: 4.2 MIL/uL (ref 3.87–5.11)
RDW: 14.1 % (ref 11.5–15.5)
WBC: 10.5 10*3/uL (ref 4.0–10.5)
nRBC: 0 % (ref 0.0–0.2)

## 2019-09-16 LAB — POC SARS CORONAVIRUS 2 AG -  ED: SARS Coronavirus 2 Ag: NEGATIVE

## 2019-09-16 LAB — APTT: aPTT: 25 seconds (ref 24–36)

## 2019-09-16 LAB — PROCALCITONIN: Procalcitonin: 2.01 ng/mL

## 2019-09-16 MED ORDER — FUROSEMIDE 10 MG/ML IJ SOLN
40.0000 mg | Freq: Once | INTRAMUSCULAR | Status: AC
  Administered 2019-09-16: 40 mg via INTRAVENOUS
  Filled 2019-09-16: qty 4

## 2019-09-16 MED ORDER — CEFEPIME HCL 2 G IJ SOLR
2.0000 g | Freq: Once | INTRAMUSCULAR | Status: AC
  Administered 2019-09-16: 2 g via INTRAVENOUS
  Filled 2019-09-16: qty 2

## 2019-09-16 MED ORDER — CEFTRIAXONE SODIUM 1 G IJ SOLR: 1.0000 g | INTRAMUSCULAR | Status: DC

## 2019-09-16 MED ORDER — SODIUM CHLORIDE 0.9 % IV BOLUS
500.0000 mL | Freq: Once | INTRAVENOUS | Status: AC
  Administered 2019-09-16: 16:00:00 500 mL via INTRAVENOUS

## 2019-09-16 MED ORDER — SODIUM CHLORIDE 0.9 % IV SOLN
2.0000 g | INTRAVENOUS | Status: AC
  Administered 2019-09-16 – 2019-09-29 (×14): 2 g via INTRAVENOUS
  Filled 2019-09-16: qty 2
  Filled 2019-09-16 (×4): qty 20
  Filled 2019-09-16: qty 2
  Filled 2019-09-16: qty 20
  Filled 2019-09-16: qty 2
  Filled 2019-09-16 (×2): qty 20
  Filled 2019-09-16 (×2): qty 2
  Filled 2019-09-16: qty 20
  Filled 2019-09-16: qty 2

## 2019-09-16 MED ORDER — DEXTROSE-NACL 5-0.45 % IV SOLN: INTRAVENOUS | Status: DC

## 2019-09-16 MED ORDER — VANCOMYCIN HCL IN DEXTROSE 1-5 GM/200ML-% IV SOLN
1000.0000 mg | Freq: Once | INTRAVENOUS | Status: DC
  Administered 2019-09-16: 17:00:00 1000 mg via INTRAVENOUS
  Filled 2019-09-16: qty 200

## 2019-09-16 MED ORDER — SODIUM CHLORIDE 0.9 % IV BOLUS
500.0000 mL | Freq: Once | INTRAVENOUS | Status: AC
  Administered 2019-09-16: 17:00:00 500 mL via INTRAVENOUS

## 2019-09-16 MED ORDER — METRONIDAZOLE IN NACL 5-0.79 MG/ML-% IV SOLN
500.0000 mg | Freq: Once | INTRAVENOUS | Status: DC
  Filled 2019-09-16: qty 100

## 2019-09-16 MED ORDER — HEPARIN SODIUM (PORCINE) 5000 UNIT/ML IJ SOLN
5000.0000 [IU] | Freq: Three times a day (TID) | INTRAMUSCULAR | Status: DC
  Administered 2019-09-16 – 2019-10-21 (×100): 5000 [IU] via SUBCUTANEOUS
  Filled 2019-09-16 (×102): qty 1

## 2019-09-16 NOTE — ED Triage Notes (Signed)
Pt BIBA from home,   Per EMS- Family called d/t pt lethargy, pt responds to verbal stimuli, pt became AMS yesterday.  LKW "a couple a days ago, it has been worsening over the last few days."  Pt has hx dementia, baseline able to hold conversation regarding ADLs.    Pt has had cough, congestion x1 week.  Productive cough with white phlegm.   Pt arrives to ED on NRB 10 L, O2 sats 97%.  On RA pt O2 sats- 83%.   Pt has hx of cancer (breast and brain). Not currently being tx.

## 2019-09-16 NOTE — H&P (Signed)
PULMONARY / CRITICAL CARE MEDICINE   NAME:  Barbara Flowers, MRN:  [id number], DOB:  [birth year], LOS: 0 ADMISSION DATE:  09/16/2019, CONSULTATION DATE:  09/16/19 REFERRING MD:  edp, CHIEF COMPLAINT:  ams/sob  BRIEF HISTORY:    43 yowf never smoker with h/o breast ca s/p bone marrow transplant early 1990s in [place] and cancer free s recent rx but problems with sundowning/ dementia and w/c bound x 3 years p cva acutely worse x one week PTA with cough/ congestion rx over the phone by PCP since 12/21 and gradually worse x 2 days PTA with poor po intake, ams and weakness with mostly mucoid sputum > to ER pm 12/26 with extensive L infiltrate and L effusion with hypoxemia corrected on non rebreathing and PCCM service asked to admit.  HISTORY OF PRESENT ILLNESS   All hx from husband as pt non-verbal   No obvious day to day or daytime variability or assoc  purulent sputum or mucus plugs or hemoptysis or cp or chest tightness, subjective wheeze or overt sinus or hb symptoms.   Usually sleeping without nocturnal  or early am exacerbation  of respiratory  c/o's or need for noct saba. Also denies any obvious fluctuation of symptoms with weather or environmental changes or other aggravating or alleviating factors except as outlined above   No unusual exposure hx or h/o childhood pna/ asthma or knowledge of premature birth.  Current Allergies, Complete Past Medical History, Past Surgical History, Family History, and Social History were reviewed in [employer] record.  ROS  The following are not active complaints unless bolded Hoarseness, sore throat, dysphagia, dental problems, itching, sneezing,  nasal congestion or discharge of excess mucus or purulent secretions, ear ache,   fever, chills, sweats, unintended wt loss or wt gain, classically pleuritic or exertional cp,  orthopnea pnd or arm/hand swelling  or leg swelling, presyncope, palpitations, abdominal pain, anorexia, nausea,  vomiting, diarrhea  or change in bowel habits or change in bladder habits, change in stools or change in urine, dysuria, hematuria,  rash, arthralgias, visual complaints, headache, numbness, weakness or ataxia or problems with walking or coordination,  change in mood or  memory.          SIGNIFICANT PAST MEDICAL HISTORY    She  has a past medical history of Anemia, , Anxiety disorder, Blood transfusion without reported diagnosis, Brain cancer ([place]), Brain tumor ([place]), Breast cancer ([place]), Cancer ([place]) (1990), H/O bone marrow transplant ([place]), Hypothyroidism, IBS (irritable bowel syndrome), MDD (major depressive disorder), Memory loss, Obesity, Orthostatic hypotension, Seizure ([place]), Seizures ([place]), Stroke ([place]) (2005), Stroke [facility]), Subdural hematoma ([place]), Upper brachial plexus paralysis syndrome, and Urinary incontinence.  SIGNIFICANT EVENTS:  Admit thru ER pm 12/26  STUDIES:     Micro:  Covid 19 PCR  12/26 neg Resp viral panel 12/26 >>> BC x 2   12/26 >>> UC  12/26 >> Urine legionella 12/26 >>> Urine Pneum Ag  12/26 >>>     ANTIBIOTICS:  Zpak 12/21 to 12/26 Rocephin 12/26 >>>   LINES/TUBES:    CONSULTANTS:    SUBJECTIVE:  Lying in fetal position L side down/ congested rattling cough/ no specific complaints   CONSTITUTIONAL: BP 132/68   Pulse 66   Temp 98.2 F (36.8 C) (Rectal)   Resp 11   SpO2 94%   No intake/output data recorded.        PHYSICAL EXAM: General:  Appears severely debilitated  Neuro:  Moves all 4 spont, not consistently FC  HEENT: NRB in place, neck supple  Cardiovascular:  RRR no s3  Lungs:  Lungs with rhonchi L >R with dullness  Abdomen:  soft Musculoskeletal:  Some muscle wasting diffusely  Skin:  No breakdown    pCXR PA   09/16/2019 :    I personally reviewed images and agree with radiology impression as follows:   Left lung base pneumonia with left pleural effusion.    RESOLVED PROBLEM LIST   ASSESSMENT AND PLAN   1) Probably CAP  already treated with approp ABX complicated by L effusion/ very poor cough mechanics in w/c bound pt with underlying cognitive impairment   2) Small/ moderate L effusion likely parapneumonic   3) Acute hypoxemic RF secondary to 1 and 2 though more due to 1 so hold off tap for now >> husband would want intubated prn   4) underlying cognitive impairment/AMS/ high risk TME explained to her husband  5) hypernatremia/ uremia c/w volume depletion but no evidence sepsis  >>> volume expand/ admit to Step down  SUMMARY OF TODAY'S PLAN:  Admit for 02/ abx/ intubate if needed   Best Practice / Goals of Care / Disposition.   DVT PROPHYLAXIS: sq hep SUP:PPI  NUTRITION:npo MOBILITY:as tol but w/c bound at baseline [place] code FAMILY DISCUSSIONS: husband updated at bedside DISPOSITION Step down   LABS  Glucose No results for input(s): GLUCAP in the last 168 hours.  BMET Recent Labs  Lab 09/16/19 1600  NA 147*  K 4.1  CL 106  CO2 30  BUN 57*  CREATININE 1.14*  GLUCOSE 125*    Liver Enzymes Recent Labs  Lab 09/16/19 1600  AST 27  ALT 27  ALKPHOS 95  BILITOT 1.2  ALBUMIN 3.1*    Electrolytes Recent Labs  Lab 09/16/19 1600  CALCIUM 9.2    CBC Recent Labs  Lab 09/16/19 1600  WBC 10.5  HGB 12.8  HCT 41.2  PLT 266    ABG Recent Labs  Lab 09/16/19 1622  PHART 7.404  PCO2ART 37.9  PO2ART 82.0*    Coag's Recent Labs  Lab 09/16/19 1600  APTT 25  INR 1.1    Sepsis Markers Recent Labs  Lab 09/16/19 1600  LATICACIDVEN 1.6    Cardiac Enzymes No results for input(s): TROPONINI, PROBNP in the last 168 hours.  PAST MEDICAL HISTORY :   She  has a past medical history of Anemia, Anemia, Anxiety disorder, Blood transfusion without reported diagnosis, Brain cancer ([place]), Brain tumor ([place]), Breast cancer ([place]), Cancer ([place]) (1990), H/O bone marrow transplant ([place]), Hypothyroidism, IBS (irritable bowel syndrome), MDD (major depressive disorder),  Memory loss, Obesity, Orthostatic hypotension, Seizure ([place]), Seizures ([place]), Stroke ([place]) (2005), Stroke [facility]), Subdural hematoma ([place]), Upper brachial plexus paralysis syndrome, and Urinary incontinence.  PAST SURGICAL HISTORY:  She  has a past surgical history that includes Lymph node biopsy; Hand surgery (Left, 2015); Gastric bypass (1985); Cholecystectomy; Craniotomy for hemispherectomy total / partial (Right); Breast lumpectomy (Left); and Hand tendon surgery (Left).  Allergies  Allergen Reactions  . Gabapentin Other (See Comments)    seizure  . Tramadol Other (See Comments)    Brings on Seizures     No current facility-administered medications on file prior to encounter.   Current Outpatient Medications on File Prior to Encounter  Medication Sig  . ALPRAZolam (XANAX) 1 MG tablet Take 1 mg by mouth at bedtime as needed for anxiety.  . Ascorbic Acid (VITAMIN C) 1000 MG tablet Take 1,000 mg by mouth  daily.  . aspirin EC 81 MG tablet Take 81 mg by mouth daily.  Marland Kitchen atorvastatin (LIPITOR) 20 MG tablet Take 20 mg by mouth at bedtime.  Marland Kitchen azithromycin (ZITHROMAX) 250 MG tablet Take 1 tablet by mouth taper from 4 doses each day to 1 dose and stop.  . Cholecalciferol (VITAMIN D3) 50 MCG (2000 UT) TABS Take 2,000 Units by mouth daily.  Marland Kitchen donepezil (ARICEPT) 10 MG tablet Take 10 mg by mouth at bedtime.  . ferrous sulfate 325 (65 FE) MG EC tablet Take 325 mg by mouth daily with breakfast.  . memantine (NAMENDA) 10 MG tablet Take 10 mg by mouth 2 (two) times daily.   . midodrine (PROAMATINE) 10 MG tablet Take 10 mg by mouth 3 (three) times daily.   . Multiple Vitamins-Minerals (MULTIVITAMIN WITH MINERALS) tablet Take 1 tablet by mouth daily.  . pantoprazole (PROTONIX) 40 MG tablet Take 40 mg by mouth daily.  . Potassium 99 MG TABS Take 99 mg by mouth daily.  . QUEtiapine (SEROQUEL) 100 MG tablet Take 100 mg by mouth at bedtime.  . chlorhexidine (PERIDEX) 0.12 % solution 15 mLs by Mouth Rinse  route 2 (two) times daily. (Patient not taking: Reported on 09/16/2019)  . magnesium oxide (MAG-OX) 400 (241.3 Mg) MG tablet Take 1 tablet (400 mg total) by mouth 2 (two) times daily. (Patient not taking: Reported on 09/16/2019)    FAMILY HISTORY:   Her family history includes Heart disease in her father; Hypertension in her father and mother; Stroke in her mother.  SOCIAL HISTORY:  She  reports that she has never smoked. She has never used smokeless tobacco. She reports current alcohol use.

## 2019-09-16 NOTE — ED Notes (Signed)
This RN attempted to wean pt to 6L O2 by nasal cannula.  Pt O2 sats decreased to 82%.  Pt replaced on NRB at 15L O2, O2 sats increased to 94%. EDP Sophia at bedside, aware.

## 2019-09-16 NOTE — ED Provider Notes (Addendum)
[place] DEPT Provider Note   CSN: [id number] Arrival date & time: 09/16/19  1441     History Chief Complaint  Patient presents with  . Altered Mental Status    Barbara Flowers is a 68 y.o. female presenting for evaluation of altered mental status.  Level 5 caveat due to altered mental status.  History obtained from triage note.  Family called EMS due to patient being extremely tired and altered.  This began yesterday, although then reported a last known well of several days ago.  Has been gradually worsening over the past several days.  At baseline patient is able to hold a conversation.  Family reports 1 week history of cough and congestion, cough is productive of white phlegm.  Per EMS, initial room sats were 83%, patient is not on oxygen at baseline.  On nonrebreather 10 L, sats improved to 97.   Additional history obtained from chart review.  Patient with a history of brain and breast cancer not currently being treated, anemia, GI bleed, previous ischemic strokes and AVM with hemorrhage results in deficits, hypothyroidism, seizures, parkinsonism.   Additional history obtained from patient's husband, Tim.  He states patient is normally much more active, able to sit up, bathe herself, feed herself and have a normal conversation.  She has weakness of her left arm and is wheelchair-bound, does most of her ADLs with her right arm.  She developed a cough on Monday, 5 days ago.  She was started on antibiotics, the cough has improved slightly.  However over the past 2 days, she has become very tired and weak.  She is not eating or drinking very much due to her lethargy.  She has not had any of her medicine for the past 2 days.  He states she is full code and would want full medical treatment.  He states she no longer has or is being treated for breast cancer or brain cancer.   HPI     Past Medical History:  Diagnosis Date  . Anemia   . Anemia   . Anxiety  disorder   . Blood transfusion without reported diagnosis   . Brain cancer [facility])    s/p sterotactic radio surgery  . Brain tumor ([place])   . Breast cancer ([place])   . Cancer ([place]) 1990   L breast  . H/O bone marrow transplant ([place])   . Hypothyroidism   . IBS (irritable bowel syndrome)   . MDD (major depressive disorder)   . Memory loss   . Obesity   . Orthostatic hypotension   . Seizure ([place])   . Seizures ([place])   . Stroke [facility]) 2005   hemorrhagic  . Stroke ([place])   . Subdural hematoma ([place])   . Upper brachial plexus paralysis syndrome   . Urinary incontinence     Patient Active Problem List   Diagnosis Date Noted  . Diarrhea 05/24/2017  . Hypokalemia 05/24/2017  . TIA (transient ischemic attack) 08/31/2015  . History of CVA with residual deficit left side 08/31/2015  . Hypothyroidism 08/31/2015  . OSA on CPAP 08/31/2015  . Dementia ([place]) 08/31/2015  . Chronic orthostatic hypotension 08/31/2015  . OAB (overactive bladder) 08/31/2015  . 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  . Peripheral neuropathy 08/31/2015  . 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 Left carotid bruit 08/31/2015  . Anemia 08/31/2015    Past Surgical History:  Procedure Laterality Date  . BREAST LUMPECTOMY Left    post chemotherapy  and radiation  . CHOLECYSTECTOMY    . CRANIOTOMY FOR HEMISPHERECTOMY TOTAL / PARTIAL Right   . GASTRIC BYPASS  1985  . HAND SURGERY Left 2015  . HAND TENDON SURGERY Left    for brachial plexus injury  . LYMPH NODE BIOPSY       OB History   No obstetric history on file.     Family History  Problem Relation Age of Onset  . Hypertension Mother   . Stroke Mother   . Heart disease Father   . Hypertension Father     Social History   Tobacco Use  . Smoking status: Never Smoker  . Smokeless tobacco: Never Used  Substance Use Topics  . Alcohol use: Yes    Alcohol/week: 0.0 standard drinks    Comment: socially  . Drug use: Not on file     Home Medications Prior to Admission medications   Medication Sig Start Date End Date Taking? Authorizing Provider  ALPRAZolam Duanne Moron) 1 MG tablet Take 1 mg by mouth at bedtime as needed for anxiety.   Yes [provider]  Ascorbic Acid (VITAMIN C) 1000 MG tablet Take 1,000 mg by mouth daily.   Yes [provider]  aspirin EC 81 MG tablet Take 81 mg by mouth daily.   Yes [provider]  atorvastatin (LIPITOR) 20 MG tablet Take 20 mg by mouth at bedtime. 09/05/19  Yes [provider]  azithromycin (ZITHROMAX) 250 MG tablet Take 1 tablet by mouth taper from 4 doses each day to 1 dose and stop. 09/11/19  Yes [provider]  Cholecalciferol (VITAMIN D3) 50 MCG (2000 UT) TABS Take 2,000 Units by mouth daily.   Yes [provider]  donepezil (ARICEPT) 10 MG tablet Take 10 mg by mouth at bedtime.   Yes [provider]  ferrous sulfate 325 (65 FE) MG EC tablet Take 325 mg by mouth daily with breakfast.   Yes [provider]  memantine (NAMENDA) 10 MG tablet Take 10 mg by mouth 2 (two) times daily.    Yes [provider]  midodrine (PROAMATINE) 10 MG tablet Take 10 mg by mouth 3 (three) times daily.    Yes [provider]  Multiple Vitamins-Minerals (MULTIVITAMIN WITH MINERALS) tablet Take 1 tablet by mouth daily.   Yes [provider]  pantoprazole (PROTONIX) 40 MG tablet Take 40 mg by mouth daily. 08/29/19  Yes [provider]  Potassium 99 MG TABS Take 99 mg by mouth daily.   Yes [provider]  QUEtiapine (SEROQUEL) 100 MG tablet Take 100 mg by mouth at bedtime.   Yes [provider]  chlorhexidine (PERIDEX) 0.12 % solution 15 mLs by Mouth Rinse route 2 (two) times daily. Patient not taking: Reported on 09/16/2019 05/27/17   Georgette Shell, MD  magnesium oxide (MAG-OX) 400 (241.3 Mg) MG tablet Take 1 tablet (400 mg total) by mouth 2 (two) times daily. Patient not  taking: Reported on 09/16/2019 05/27/17   Georgette Shell, MD    Allergies    Gabapentin and Tramadol  Review of Systems   Review of Systems  Unable to perform ROS: Mental status change  Respiratory: Positive for cough.   Neurological: Positive for weakness.  Psychiatric/Behavioral: Positive for confusion.    Physical Exam Updated Vital Signs BP 132/68   Pulse 66   Temp 98.2 F (36.8 C) (Rectal)   Resp 11   SpO2 94%   Physical Exam Vitals and nursing note reviewed.  Constitutional:      Appearance: She is cachectic. She is ill-appearing.     Comments: Elderly cachectic and chronically ill-appearing female who is confused.  HENT:     Head: Normocephalic and atraumatic.     Mouth/Throat:     Comments: MM dry Eyes:     Conjunctiva/sclera: Conjunctivae normal.     Pupils: Pupils are equal, round, and reactive to light.  Cardiovascular:     Rate and Rhythm: Normal rate and regular rhythm.     Pulses: Normal pulses.  Pulmonary:     Effort: Pulmonary effort is normal. No respiratory distress.     Breath sounds: Rhonchi present. No wheezing.     Comments: Crackles heard in bases bilaterally Abdominal:     General: There is no distension.     Palpations: Abdomen is soft. There is no mass.     Tenderness: There is no abdominal tenderness. There is no guarding or rebound.  Musculoskeletal:        General: Normal range of motion.     Cervical back: Normal range of motion and neck supple.     Right lower leg: No edema.     Left lower leg: No edema.     Comments: Sacral ulcer with tenderness.  Skin:    General: Skin is warm and dry.     Capillary Refill: Capillary refill takes less than 2 seconds.  Neurological:     Mental Status: She is lethargic.     GCS: GCS eye subscore is 3. GCS verbal subscore is 4. GCS motor subscore is 6.     Comments: Alert to name and place. Able to follow commands, but appears extremely weak, making it hard to evaluate for confusion vs  weakness.      ED Results / Procedures / Treatments   Labs (all labs ordered are listed, but only abnormal results are displayed) Labs Reviewed  COMPREHENSIVE METABOLIC PANEL - Abnormal; Notable for the following components:      Result Value   Sodium 147 (*)    Glucose, Bld 125 (*)    BUN 57 (*)    Creatinine, Ser 1.14 (*)    Albumin 3.1 (*)    GFR calc non Af Amer 49 (*)    GFR calc Af Amer 57 (*)    All other components within normal limits  CBC WITH DIFFERENTIAL/PLATELET - Abnormal; Notable for the following components:   Abs Immature Granulocytes 0.35 (*)    All other components within normal limits  BLOOD GAS, ARTERIAL - Abnormal; Notable for the following components:   pO2, Arterial 82.0 (*)    All other components within normal limits  CULTURE, BLOOD (ROUTINE X 2)  CULTURE, BLOOD (ROUTINE X 2)  URINE CULTURE  RESPIRATORY PANEL BY RT PCR (FLU A&B, COVID)  LACTIC ACID, PLASMA  APTT  PROTIME-INR  LACTIC ACID, PLASMA  URINALYSIS, ROUTINE W REFLEX MICROSCOPIC  BRAIN NATRIURETIC PEPTIDE  POC SARS CORONAVIRUS 2 AG -  ED    EKG None  Radiology DG Pelvis Portable  Result Date: 09/16/2019 CLINICAL DATA:  sacral ulcer. EXAM: PORTABLE PELVIS 1-2 VIEWS COMPARISON:  None. FINDINGS: No acute fracture or dislocation is identified. Extensive bowel content is identified in the visualized rectal sigmoid colon. Degenerative joint changes of right hip with narrowed joint space and osteophyte formation are noted. IMPRESSION: 1. No acute fracture or dislocation. 2. Extensive bowel content is identified in the visualized rectal sigmoid colon. Electronically Signed   By: Abelardo Diesel  M.D.   On: 09/16/2019 16:46   DG Chest Port 1 View  Result Date: 09/16/2019 CLINICAL DATA:  Altered mental status EXAM: PORTABLE CHEST 1 VIEW COMPARISON:  October 25, 2017 FINDINGS: Patchy consolidation left lung base with left pleural effusion is identified. The right lung is clear. Mediastinal contour  and cardiac silhouette are stable. Surgical clips are projected over the left axilla unchanged. IMPRESSION: Left lung base pneumonia with left pleural effusion. Electronically Signed   By: Abelardo Diesel M.D.   On: 09/16/2019 16:44    Procedures .Critical Care Performed by: Franchot Heidelberg, PA-C Authorized by: Franchot Heidelberg, PA-C   Critical care provider statement:    Critical care time (minutes):  45   Critical care time was exclusive of:  Separately billable procedures and treating other patients and teaching time   Critical care was necessary to treat or prevent imminent or life-threatening deterioration of the following conditions:  Respiratory failure, dehydration and sepsis   Critical care was time spent personally by me on the following activities:  Blood draw for specimens, development of treatment plan with patient or surrogate, evaluation of patient's response to treatment, examination of patient, obtaining history from patient or surrogate, ordering and performing treatments and interventions, ordering and review of laboratory studies, ordering and review of radiographic studies, pulse oximetry, re-evaluation of patient's condition and review of old charts   I assumed direction of critical care for this patient from another provider in my specialty: no   Comments:     Patient appears critically ill, hypoxic in the low 80s on room air.  She is tachypneic, concern for sepsis.  Additionally patient appears extremely dehydrated.  IV antibiotics started, code sepsis called, patient to be admitted to the ICU.   (including critical care time)  Medications Ordered in ED Medications  metroNIDAZOLE (FLAGYL) IVPB 500 mg (has no administration in time range)  vancomycin (VANCOCIN) IVPB 1000 mg/200 mL premix (1,000 mg Intravenous New Bag/Given 09/16/19 1655)  ceFEPIme (MAXIPIME) 2 g in sodium chloride 0.9 % 100 mL IVPB (2 g Intravenous New Bag/Given 09/16/19 1558)  sodium chloride 0.9 %  bolus 500 mL (500 mLs Intravenous New Bag/Given 09/16/19 1605)  sodium chloride 0.9 % bolus 500 mL (500 mLs Intravenous New Bag/Given 09/16/19 1654)    ED Course  I have reviewed the triage vital signs and the nursing notes.  Pertinent labs & imaging results that were available during my care of the patient were reviewed by me and considered in my medical decision making (see chart for details).    MDM Rules/Calculators/A&P                      Pt presenting for evaluation of lethargy.  Physical exam shows chronically ill, cachectic appearing female appears extremely dehydrated and lethargic.  Usually she was hypoxic on room air, code sepsis called.  Consider sepsis, likely fever.  Consider dehydration.  Consider Covid.  Consider PE due to hypoxia without fever.  Will obtain sepsis work-up and x-ray.  If x-ray is normal, consider need for CTA.  CT head ordered due to AMS.  Antibiotics started.  1 L of fluids given for fluid resuscitation.  Will obtain x-ray of the pelvis due to sacral ulcer to rule out osteo.  Labs overall reassuring.  No leukocytosis.  BUN is up at 57, no recent labs to compare.  Initial lactic is normal.  Chest x-ray viewed interpreted by me, shows left lobe pneumonia with pleural effusion.  Rapid Covid is negative, however Covid still remains on the differential.  Due to source being identified, will discontinue CT head ordered.  X-ray of the pelvis viewed and interpreted by me, no obvious osteo.  Case discussed with attending, Dr. Roderic Palau evaluated the patient.  Attempted weaning of O2 to 6 L via nasal cannula.  Patient again became hypoxic in the 80s, nonrebreather was placed again at 10 L and sats improved. Will consult with PCCM for admission due to level of hypoxia.   Case discussed with Dr. Melvyn Novas from PCCM, pt to be admitted.   Elowynn Navarrette was evaluated in Emergency Department on 09/16/2019 for the symptoms described in the history of present illness. She was evaluated  in the context of the global COVID-19 pandemic, which necessitated consideration that the patient might be at risk for infection with the SARS-CoV-2 virus that causes COVID-19. Institutional protocols and algorithms that pertain to the evaluation of patients at risk for COVID-19 are in a state of rapid change based on information released by regulatory bodies including the CDC and federal and state organizations. These policies and algorithms were followed during the patient's care in the ED.    1845: Informed by RN that patient became hypoxic and more confused.  Sats 75 to low 80s.  Will place patient on BiPAP.   Sats improved on BiPAP.  Called critical care to inform them of change in condition.  Plan for continued monitoring, intubation if condition worsens. covid and flu negative.   Final Clinical Impression(s) / ED Diagnoses Final diagnoses:  Acute respiratory failure with hypoxia ([place])  Community acquired pneumonia of left lower lobe of lung    Rx / DC Orders ED Discharge Orders    None        Franchot Heidelberg, PA-C 09/16/19 Maryagnes Amos, MD 09/16/19 1845    Franchot Heidelberg, PA-C 09/16/19 2015    Milton Ferguson, MD 09/17/19 2212

## 2019-09-16 NOTE — ED Notes (Addendum)
Patient o2 saturation 72% on NR 10 L. ED provider called and at bedside.

## 2019-09-16 NOTE — ED Notes (Signed)
Husband, Tim, at bedside wearing appropriate PPE, provided education regarding isolation precautions.  Pt meets ALONE criteria.

## 2019-09-16 NOTE — Progress Notes (Signed)
A consult was received from an ED physician for vancomycin and cefepime per pharmacy dosing (for an indication other than meningitis). The patient's profile has been reviewed for ht/wt/allergies/indication/available labs. A one time order has been placed for the above antibiotics.  Further antibiotics/pharmacy consults should be ordered by admitting physician if indicated.                       Reuel Boom, PharmD, BCPS [phone] 09/16/2019, 3:17 PM

## 2019-09-16 NOTE — ED Notes (Signed)
While this RN and NT were collecting urine sample, pt began to desat on 15L O2 by NRB.  Lowest recorded O2 sat on NRB= 72%.  EDP Sophia, MD [place] called to bedside.  RT called to bedside.

## 2019-09-17 ENCOUNTER — Inpatient Hospital Stay (HOSPITAL_COMMUNITY): Payer: Medicare Other

## 2019-09-17 DIAGNOSIS — L8990 Pressure ulcer of unspecified site, unspecified stage: Secondary | ICD-10-CM | POA: Insufficient documentation

## 2019-09-17 LAB — BLOOD CULTURE ID PANEL (REFLEXED)
Acinetobacter baumannii: NOT DETECTED
Candida albicans: NOT DETECTED
Candida glabrata: NOT DETECTED
Candida krusei: NOT DETECTED
Candida parapsilosis: NOT DETECTED
Candida tropicalis: NOT DETECTED
Carbapenem resistance: NOT DETECTED
Enterobacter cloacae complex: NOT DETECTED
Enterobacteriaceae species: DETECTED — AB
Enterococcus species: NOT DETECTED
Escherichia coli: NOT DETECTED
Haemophilus influenzae: NOT DETECTED
Klebsiella oxytoca: NOT DETECTED
Klebsiella pneumoniae: NOT DETECTED
Listeria monocytogenes: NOT DETECTED
Methicillin resistance: DETECTED — AB
Neisseria meningitidis: NOT DETECTED
Proteus species: DETECTED — AB
Pseudomonas aeruginosa: NOT DETECTED
Serratia marcescens: NOT DETECTED
Staphylococcus aureus (BCID): NOT DETECTED
Staphylococcus species: DETECTED — AB
Streptococcus agalactiae: NOT DETECTED
Streptococcus pneumoniae: NOT DETECTED
Streptococcus pyogenes: NOT DETECTED
Streptococcus species: NOT DETECTED

## 2019-09-17 LAB — BASIC METABOLIC PANEL
Anion gap: 19 — ABNORMAL HIGH (ref 5–15)
BUN: 41 mg/dL — ABNORMAL HIGH (ref 8–23)
CO2: 22 mmol/L (ref 22–32)
Calcium: 8 mg/dL — ABNORMAL LOW (ref 8.9–10.3)
Chloride: 103 mmol/L (ref 98–111)
Creatinine, Ser: 0.71 mg/dL (ref 0.44–1.00)
GFR calc Af Amer: >60 mL/min (ref >60)
GFR calc non Af Amer: >60 mL/min (ref >60)
Glucose, Bld: 258 mg/dL — ABNORMAL HIGH (ref 70–99)
Potassium: 3.4 mmol/L — ABNORMAL LOW (ref 3.5–5.1)
Sodium: 144 mmol/L (ref 135–145)

## 2019-09-17 LAB — CBC WITH DIFFERENTIAL/PLATELET
Abs Immature Granulocytes: 0.14 10*3/uL — ABNORMAL HIGH (ref 0.00–0.07)
Basophils Absolute: 0 10*3/uL (ref 0.0–0.1)
Basophils Relative: 0 %
Eosinophils Absolute: 0 10*3/uL (ref 0.0–0.5)
Eosinophils Relative: 0 %
HCT: 41.4 % (ref 36.0–46.0)
Hemoglobin: 12.7 g/dL (ref 12.0–15.0)
Immature Granulocytes: 1 %
Lymphocytes Relative: 10 %
Lymphs Abs: 1.4 10*3/uL (ref 0.7–4.0)
MCH: 30.1 pg (ref 26.0–34.0)
MCHC: 30.7 g/dL (ref 30.0–36.0)
MCV: 98.1 fL (ref 80.0–100.0)
Monocytes Absolute: 0.4 10*3/uL (ref 0.1–1.0)
Monocytes Relative: 3 %
Neutro Abs: 11.7 10*3/uL — ABNORMAL HIGH (ref 1.7–7.7)
Neutrophils Relative %: 86 %
Platelets: 268 10*3/uL (ref 150–400)
RBC: 4.22 MIL/uL (ref 3.87–5.11)
RDW: 13.8 % (ref 11.5–15.5)
WBC: 13.7 10*3/uL — ABNORMAL HIGH (ref 4.0–10.5)
nRBC: 0 % (ref 0.0–0.2)

## 2019-09-17 LAB — BLOOD GAS, ARTERIAL
Acid-Base Excess: 2.4 mmol/L — ABNORMAL HIGH (ref 0.0–2.0)
Bicarbonate: 25.9 mmol/L (ref 20.0–28.0)
FIO2: 40
O2 Saturation: 93.6 %
Patient temperature: 98.6
pCO2 arterial: 38.1 mmHg (ref 32.0–48.0)
pH, Arterial: 7.448 (ref 7.350–7.450)
pO2, Arterial: 72 mmHg — ABNORMAL LOW (ref 83.0–108.0)

## 2019-09-17 LAB — CORTISOL: Cortisol, Plasma: 26.5 ug/dL

## 2019-09-17 LAB — GLUCOSE, CAPILLARY: Glucose-Capillary: 236 mg/dL — ABNORMAL HIGH (ref 70–99)

## 2019-09-17 LAB — STREP PNEUMONIAE URINARY ANTIGEN: Strep Pneumo Urinary Antigen: POSITIVE — AB

## 2019-09-17 MED ORDER — CHLORHEXIDINE GLUCONATE CLOTH 2 % EX PADS
6.0000 | MEDICATED_PAD | Freq: Every day | CUTANEOUS | Status: DC
  Administered 2019-09-17 – 2019-10-20 (×30): 6 via TOPICAL

## 2019-09-17 MED ORDER — SODIUM CHLORIDE 0.9 % IV SOLN
500.0000 mg | INTRAVENOUS | Status: DC
  Administered 2019-09-17 – 2019-09-18 (×2): 500 mg via INTRAVENOUS
  Filled 2019-09-17 (×2): qty 500

## 2019-09-17 MED ORDER — ORAL CARE MOUTH RINSE
15.0000 mL | Freq: Two times a day (BID) | OROMUCOSAL | Status: DC
  Administered 2019-09-17 – 2019-09-24 (×16): 15 mL via OROMUCOSAL

## 2019-09-17 NOTE — H&P (Deleted)
PULMONARY / CRITICAL CARE MEDICINE   NAME:  Barbara Flowers, MRN:  [id number], DOB:  [birth year], LOS: 1 ADMISSION DATE:  09/16/2019, CONSULTATION DATE:  09/16/19 REFERRING MD:  edp, CHIEF COMPLAINT:  ams/sob  BRIEF HISTORY:    89 yowf never smoker with h/o breast ca s/p bone marrow transplant early 1990s in [place] and cancer free s recent rx but problems with sundowning/ dementia and w/c bound x 3 years p cva acutely worse x one week PTA with cough/ congestion rx over the phone by PCP since 12/21 Zpak  and gradually worse x 2 days PTA with poor po intake, ams and weakness with mostly mucoid sputum > to ER pm 12/26 with extensive L infiltrate and L effusion with hypoxemia corrected on non rebreathing and PCCM service asked to admit.    SIGNIFICANT PAST MEDICAL HISTORY    She  has a past medical history of Anemia, , Anxiety disorder, Blood transfusion without reported diagnosis, Brain cancer ([place]), Brain tumor ([place]), Breast cancer ([place]), Cancer ([place]) (1990), H/O bone marrow transplant ([place]), Hypothyroidism, IBS (irritable bowel syndrome), MDD (major depressive disorder), Memory loss, Obesity, Orthostatic hypotension, Seizure ([place]), Seizures ([place]), Stroke ([place]) (2005), Stroke [facility]), Subdural hematoma ([place]), Upper brachial plexus paralysis syndrome, and Urinary incontinence.  SIGNIFICANT EVENTS:  Admit thru ER pm 12/26   STUDIES:       Micro:  Covid 19 PCR  12/26 neg Resp viral panel 12/26 >>> neg  Procalcitonin  12/26 = 2.01  BC x 2   12/26  >>> UC  12/26  >>> Urine legionella  12/26 >>> POSITIVE  Urine Pneum Ag  12/26 >>>     ANTIBIOTICS:  Zpak 12/21 to 12/26> restarted 12/27 IV Rocephin 12/26  >>>    LINES/TUBES:    CONSULTANTS:     SUBJECTIVE:  Still lying fetal position/ left side down/ will cough to request but very puny   CONSTITUTIONAL: BP (!) 85/58   Pulse 82   Temp 98.3 F (36.8 C) (Axillary)   Resp (!) 24   Ht 5\' 8"  (1.727 m)   Wt 54.7 kg   SpO2 94%   BMI  18.34 kg/m   I/O last 3 completed shifts: In: 2447.6 [I.V.:1253.9; IV Piggyback:1193.7] Out: 200 [Urine:200]     Vent Mode: PCV FiO2 (%):  [40 %-100 %] 40 % Set Rate:  [15 bmp] 15 bmp PEEP:  [6 cmH20] 6 cmH20  PHYSICAL EXAM: No increased wob  No jvd Oropharynx  Mucosa dry  Neck supple Lungs with rhonchi L >R and dullness L base  RRR no s3 or or sign murmur Abd soft/ non-tender/ limited excursion  Extr some generalized muscle wasting     pCXR PA   09/17/19 I personally reviewed images and agree with radiology impression as follows:   Slight interval worsening of airspace opacification over the left lung. Possible component of left pleural fluid. My review: if anything there is vol loss on Left, not "mass effect" from effusion      RESOLVED PROBLEM LIST   ASSESSMENT AND PLAN   1) Probably CAP/pneumococcal  l already treated with zmax complicated by   small  L effusion/ very poor cough mechanics in w/c bound pt with underlying cognitive impairment  - discussed with daughter NP and husband, teetering on need to intubate  - add back zmax 12/27 as anecdotally does help combined with rocephin in peumococcal pna  2) Small/ moderate L effusion likely parapneumonic  - vol loss on L makes it less  likely we can help with tap so hold off for now   3) Acute hypoxemic RF secondary to 1 and 2 though more due to 1   >> husband would want intubated prn   4) Underlying cognitive impairment/AMS/ high risk TME explained to her husband  5) hypernatremia/ uremia c/w volume depletion but no evidence sepsis  >>> continue volume expansion in moderation as likely has leaky capillaries in L ung/ pleural space.   SUMMARY OF TODAY'S PLAN:  Continue fluids, rocephin   Best Practice / Goals of Care / Disposition.   DVT PROPHYLAXIS: sq hep SUP: n/a NUTRITION:npo MOBILITY:as tol but w/c bound at baseline [place] code FAMILY DISCUSSIONS: husband /daughter updated by  phone DISPOSITION Step down   LABS  Glucose Recent Labs  Lab 09/17/19 0842  GLUCAP 236*    BMET Recent Labs  Lab 09/16/19 1600  NA 147*  K 4.1  CL 106  CO2 30  BUN 57*  CREATININE 1.14*  GLUCOSE 125*    Liver Enzymes Recent Labs  Lab 09/16/19 1600  AST 27  ALT 27  ALKPHOS 95  BILITOT 1.2  ALBUMIN 3.1*    Electrolytes Recent Labs  Lab 09/16/19 1600  CALCIUM 9.2    CBC Recent Labs  Lab 09/16/19 1600  WBC 10.5  HGB 12.8  HCT 41.2  PLT 266    ABG Recent Labs  Lab 09/16/19 1622 09/17/19 0822  PHART 7.404 7.448  PCO2ART 37.9 38.1  PO2ART 82.0* 72.0*    Coag's Recent Labs  Lab 09/16/19 1600  APTT 25  INR 1.1    Sepsis Markers Recent Labs  Lab 09/16/19 1600 09/16/19 1830  LATICACIDVEN 1.6 1.4  PROCALCITON 2.01  --     Cardiac Enzymes No results for input(s): TROPONINI, PROBNP in the last 168 hours.    The patient is critically ill with multiple organ systems failure and requires high complexity decision making for assessment and support, frequent evaluation and titration of therapies, application of advanced monitoring technologies and extensive interpretation of multiple databases. Critical Care Time devoted to patient care services described in this note is 35 minutes.    Christinia Gully, MD Pulmonary and [place] [phone] After 5:30 PM or weekends, use Beeper [phone]

## 2019-09-17 NOTE — Progress Notes (Signed)
PHARMACY - PHYSICIAN COMMUNICATION CRITICAL VALUE ALERT - BLOOD CULTURE IDENTIFICATION (BCID)  Barbara Flowers is an 68 y.o. female who presented to [facility] on 09/16/2019 with a chief complaint of pneumonia  Assessment:  MR-CoNS AND Proteus in 1/4 BCx bottles (source unknown)  Name of physician (or Provider) ContactedMelvyn Novas, MD  Current antibiotics: Rocephin 2g/day + Azithromycin  Changes to prescribed antibiotics recommended: none Patient is on recommended antibiotics - No changes needed  Results for orders placed or performed during the hospital encounter of 09/16/19  Blood Culture ID Panel (Reflexed) (Collected: 09/16/2019  4:01 PM)  Result Value Ref Range   Enterococcus species NOT DETECTED NOT DETECTED   Listeria monocytogenes NOT DETECTED NOT DETECTED   Staphylococcus species DETECTED (A) NOT DETECTED   Staphylococcus aureus (BCID) NOT DETECTED NOT DETECTED   Methicillin resistance DETECTED (A) NOT DETECTED   Streptococcus species NOT DETECTED NOT DETECTED   Streptococcus agalactiae NOT DETECTED NOT DETECTED   Streptococcus pneumoniae NOT DETECTED NOT DETECTED   Streptococcus pyogenes NOT DETECTED NOT DETECTED   Acinetobacter baumannii NOT DETECTED NOT DETECTED   Enterobacteriaceae species DETECTED (A) NOT DETECTED   Enterobacter cloacae complex NOT DETECTED NOT DETECTED   Escherichia coli NOT DETECTED NOT DETECTED   Klebsiella oxytoca NOT DETECTED NOT DETECTED   Klebsiella pneumoniae NOT DETECTED NOT DETECTED   Proteus species DETECTED (A) NOT DETECTED   Serratia marcescens NOT DETECTED NOT DETECTED   Carbapenem resistance NOT DETECTED NOT DETECTED   Haemophilus influenzae NOT DETECTED NOT DETECTED   Neisseria meningitidis NOT DETECTED NOT DETECTED   Pseudomonas aeruginosa NOT DETECTED NOT DETECTED   Candida albicans NOT DETECTED NOT DETECTED   Candida glabrata NOT DETECTED NOT DETECTED   Candida krusei NOT DETECTED NOT DETECTED   Candida parapsilosis NOT DETECTED  NOT DETECTED   Candida tropicalis NOT DETECTED NOT DETECTED    Marjon Doxtater A 09/17/2019  2:45 PM

## 2019-09-17 NOTE — Progress Notes (Signed)
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    SIGNIFICANT PAST MEDICAL HISTORY    She  has a past medical history of Anemia, , Anxiety disorder, Blood transfusion without reported diagnosis, Brain cancer ([place]), Brain tumor ([place]), Breast cancer ([place]), Cancer ([place]) (1990), H/O bone marrow transplant ([place]), Hypothyroidism, IBS (irritable bowel syndrome), MDD (major depressive disorder), Memory loss, Obesity, Orthostatic hypotension, Seizure ([place]), Seizures ([place]), Stroke ([place]) (2005), Stroke [facility]), Subdural hematoma ([place]), Upper brachial plexus paralysis syndrome, and Urinary incontinence.  SIGNIFICANT EVENTS:  Admit thru ER pm 12/26   STUDIES:       Micro:  Covid 19 PCR  12/26 neg Resp viral panel 12/26 >>> neg  Procalcitonin  12/26 = 2.01  BC x 2   12/26  >>> UC  12/26  >>> Urine legionella  12/26 >>> POSITIVE  Urine Pneum Ag  12/26 >>>     ANTIBIOTICS:  Zpak 12/21 to 12/26> restarted 12/27 IV Rocephin 12/26  >>>    LINES/TUBES:    CONSULTANTS:     SUBJECTIVE:  Still lying fetal position/ left side down/ will cough to request but very puny   CONSTITUTIONAL: BP (!) 87/61   Pulse 81   Temp (!) 97.4 F (36.3 C) (Axillary)   Resp (!) 22   Ht 5\' 8"  (1.727 m)   Wt 54.7 kg   SpO2 95%   BMI  18.34 kg/m   I/O last 3 completed shifts: In: 2447.6 [I.V.:1253.9; IV Piggyback:1193.7] Out: 200 [Urine:200]     Vent Mode: PCV FiO2 (%):  [40 %-100 %] 40 % Set Rate:  [15 bmp] 15 bmp PEEP:  [6 cmH20] 6 cmH20  PHYSICAL EXAM: No increased wob  No jvd Oropharynx  Mucosa dry  Neck supple Lungs with rhonchi L >R and dullness L base  RRR no s3 or or sign murmur Abd soft/ non-tender/ limited excursion  Extr some generalized muscle wasting     pCXR PA   09/17/19 I personally reviewed images and agree with radiology impression as follows:   Slight interval worsening of airspace opacification over the left lung. Possible component of left pleural fluid. My review: if anything there is vol loss on Left, not "mass effect" from effusion      RESOLVED PROBLEM LIST   ASSESSMENT AND PLAN   1) Probably CAP/pneumococcal  l already treated with zmax complicated by   small  L effusion/ very poor cough mechanics in w/c bound pt with underlying cognitive impairment  - discussed with daughter NP and husband, teetering on need to intubate  - add back zmax 12/27 as anecdotally does help combined with rocephin in peumococcal pna  2) Small/ moderate L effusion likely parapneumonic  - vol loss on L makes it  less likely we can help with tap so hold off for now   3) Acute hypoxemic RF secondary to 1 and 2 though more due to 1   >> husband would want intubated prn   4) Underlying cognitive impairment/AMS/ high risk TME explained to her husband  5) hypernatremia/ uremia c/w volume depletion but no evidence sepsis  >>> continue volume expansion in moderation as likely has leaky capillaries in L ung/ pleural space.   SUMMARY OF TODAY'S PLAN:  Continue fluids, rocephin   Best Practice / Goals of Care / Disposition.   DVT PROPHYLAXIS: sq hep SUP: n/a NUTRITION:npo MOBILITY:as tol but w/c bound at baseline [place] code FAMILY DISCUSSIONS: husband /daughter updated by  phone DISPOSITION Step down   LABS  Glucose Recent Labs  Lab 09/17/19 0842  GLUCAP 236*    BMET Recent Labs  Lab 09/16/19 1600  NA 147*  K 4.1  CL 106  CO2 30  BUN 57*  CREATININE 1.14*  GLUCOSE 125*    Liver Enzymes Recent Labs  Lab 09/16/19 1600  AST 27  ALT 27  ALKPHOS 95  BILITOT 1.2  ALBUMIN 3.1*    Electrolytes Recent Labs  Lab 09/16/19 1600  CALCIUM 9.2    CBC Recent Labs  Lab 09/16/19 1600  WBC 10.5  HGB 12.8  HCT 41.2  PLT 266    ABG Recent Labs  Lab 09/16/19 1622 09/17/19 0822  PHART 7.404 7.448  PCO2ART 37.9 38.1  PO2ART 82.0* 72.0*    Coag's Recent Labs  Lab 09/16/19 1600  APTT 25  INR 1.1    Sepsis Markers Recent Labs  Lab 09/16/19 1600 09/16/19 1830  LATICACIDVEN 1.6 1.4  PROCALCITON 2.01  --     Cardiac Enzymes No results for input(s): TROPONINI, PROBNP in the last 168 hours.    The patient is critically ill with multiple organ systems failure and requires high complexity decision making for assessment and support, frequent evaluation and titration of therapies, application of advanced monitoring technologies and extensive interpretation of multiple databases. Critical Care Time devoted to patient care services described in this note is 35 minutes.    Christinia Gully, MD Pulmonary and [place] [phone] After 5:30 PM or weekends, use Beeper [phone]

## 2019-09-18 ENCOUNTER — Other Ambulatory Visit: Payer: Self-pay

## 2019-09-18 ENCOUNTER — Inpatient Hospital Stay (HOSPITAL_COMMUNITY): Payer: Medicare Other

## 2019-09-18 ENCOUNTER — Encounter (HOSPITAL_COMMUNITY): Payer: Self-pay | Admitting: Internal Medicine

## 2019-09-18 LAB — GLUCOSE, CAPILLARY
Glucose-Capillary: 169 mg/dL — ABNORMAL HIGH (ref 70–99)
Glucose-Capillary: 116 mg/dL — ABNORMAL HIGH (ref 70–99)
Glucose-Capillary: 96 mg/dL (ref 70–99)
Glucose-Capillary: 94 mg/dL (ref 70–99)

## 2019-09-18 LAB — BODY FLUID CELL COUNT WITH DIFFERENTIAL
Lymphs, Fluid: 9 %
Monocyte-Macrophage-Serous Fluid: 15 % — ABNORMAL LOW (ref 50–90)
Neutrophil Count, Fluid: 76 % — ABNORMAL HIGH (ref 0–25)
Total Nucleated Cell Count, Fluid: 2222 cu mm — ABNORMAL HIGH (ref 0–1000)

## 2019-09-18 LAB — GLUCOSE, PLEURAL OR PERITONEAL FLUID: Glucose, Fluid: 194 mg/dL

## 2019-09-18 LAB — BLOOD GAS, ARTERIAL
Acid-Base Excess: 1.4 mmol/L (ref 0.0–2.0)
Bicarbonate: 23.6 mmol/L (ref 20.0–28.0)
Drawn by: 232811
FIO2: 100
O2 Saturation: 99 %
Patient temperature: 98.3
pCO2 arterial: 30.4 mmHg — ABNORMAL LOW (ref 32.0–48.0)
pH, Arterial: 7.501 — ABNORMAL HIGH (ref 7.350–7.450)
pO2, Arterial: 147 mmHg — ABNORMAL HIGH (ref 83.0–108.0)

## 2019-09-18 LAB — LACTATE DEHYDROGENASE, PLEURAL OR PERITONEAL FLUID: LD, Fluid: 222 U/L — ABNORMAL HIGH (ref 3–23)

## 2019-09-18 LAB — URINE CULTURE: Culture: NO GROWTH

## 2019-09-18 LAB — HEMOGLOBIN A1C
Hgb A1c MFr Bld: 5.6 % (ref 4.8–5.6)
Mean Plasma Glucose: 114.02 mg/dL

## 2019-09-18 LAB — MRSA PCR SCREENING: MRSA by PCR: NEGATIVE

## 2019-09-18 LAB — PROCALCITONIN: Procalcitonin: 0.6 ng/mL

## 2019-09-18 MED ORDER — INSULIN ASPART 100 UNIT/ML ~~LOC~~ SOLN
0.0000 [IU] | SUBCUTANEOUS | Status: DC
  Administered 2019-09-18: 13:00:00 2 [IU] via SUBCUTANEOUS
  Administered 2019-09-19: 1 [IU] via SUBCUTANEOUS
  Administered 2019-09-20 (×2): 2 [IU] via SUBCUTANEOUS
  Administered 2019-09-20: 13:00:00 1 [IU] via SUBCUTANEOUS
  Administered 2019-09-20: 05:00:00 2 [IU] via SUBCUTANEOUS
  Administered 2019-09-21 – 2019-09-23 (×8): 1 [IU] via SUBCUTANEOUS
  Administered 2019-09-24: 2 [IU] via SUBCUTANEOUS
  Administered 2019-09-24: 1 [IU] via SUBCUTANEOUS
  Administered 2019-09-24: 2 [IU] via SUBCUTANEOUS
  Administered 2019-09-24 (×2): 1 [IU] via SUBCUTANEOUS
  Administered 2019-09-24: 2 [IU] via SUBCUTANEOUS
  Administered 2019-09-25 (×4): 1 [IU] via SUBCUTANEOUS
  Administered 2019-09-27: 3 [IU] via SUBCUTANEOUS
  Administered 2019-09-30 – 2019-10-04 (×9): 1 [IU] via SUBCUTANEOUS

## 2019-09-18 MED ORDER — LACTATED RINGERS IV BOLUS
500.0000 mL | Freq: Once | INTRAVENOUS | Status: AC
  Administered 2019-09-18: 500 mL via INTRAVENOUS

## 2019-09-18 MED ORDER — METOLAZONE 5 MG PO TABS
5.0000 mg | ORAL_TABLET | Freq: Once | ORAL | Status: DC
  Filled 2019-09-18: qty 1

## 2019-09-18 MED ORDER — ALBUMIN HUMAN 25 % IV SOLN
25.0000 g | Freq: Four times a day (QID) | INTRAVENOUS | Status: AC
  Administered 2019-09-18 – 2019-09-19 (×4): 25 g via INTRAVENOUS
  Filled 2019-09-18 (×3): qty 100
  Filled 2019-09-18: qty 50

## 2019-09-18 MED ORDER — POTASSIUM CHLORIDE 10 MEQ/100ML IV SOLN
10.0000 meq | INTRAVENOUS | Status: AC
  Administered 2019-09-18 (×4): 10 meq via INTRAVENOUS
  Filled 2019-09-18 (×4): qty 100

## 2019-09-18 MED ORDER — LACTATED RINGERS IV SOLN: INTRAVENOUS | Status: DC

## 2019-09-18 NOTE — Progress Notes (Signed)
Hypotensive Repeat CXR 500cc LR bolus Will have to hold on metolazone Albumin as ordered

## 2019-09-18 NOTE — Procedures (Signed)
[place]ora w/ [place] Note Timeout performed Left chest examined with [place] and skin overlying fluid pocket marked Area prepped and anesthesized with 1% lidocaine 200  cc cloudy  fluid removed Bandaid applied to site CXR pending No immediate complications

## 2019-09-18 NOTE — Progress Notes (Signed)
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   SIGNIFICANT PAST MEDICAL HISTORY   Anemia Anxiety Disorder Blood transfusion Brain Cancer/Tumor  Breast Cancer  Hx Bone Marrow Transplant Hypothyroidism IBS Depression Memory Loss  CVA Seizures SDH Upper bracial plexus paralysis syndrome  Urinary incontinence   SIGNIFICANT EVENTS:  12/26 Admit  12/28 Respiratory distress, HFNC  STUDIES:       Micro:  Covid 19 PCR 12/26 >> neg RVP 12/26 >> neg  Procalcitonin 12/26 >> 2.01  BCID 12/26 >> staph species, methicillin resistance, proteus species, enterobacteriaceae species detected BCx2 12/26 >> GNR, GPC in aerobic bottle >> UC 12/26 >> negative Urine legionella 12/26 >>   Urine Pneum Ag 12/26 >> positive  ANTIBIOTICS:  Zpak 12/21 to 12/26 (outpatient) Azithro 12/27 >>   Rocephin 12/26 >>   LINES/TUBES:     CONSULTANTS:     SUBJECTIVE:  Tmax 98.3 Remains on HFNC, O2 reduced to 50%, 40L flow I/O - 550 UOP, 1.8L+ in 24h Pt denies SOB, pain  CONSTITUTIONAL: BP 106/76   Pulse (!) 101   Temp 98.3 F (36.8 C) (Axillary)   Resp (!) 26   Ht 5\' 8"  (1.727 m)   Wt 56.5 kg   SpO2 99%   BMI 18.94 kg/m   I/O last 3 completed shifts: In: [id number] [I.V.:3009.1; IV Piggyback:438] Out: 750  [Urine:750]     FiO2 (%):  [30 %-100 %] 80 %  PHYSICAL EXAM: General: frail adult female lying in bed in NAD on HFNC HEENT: MM pink/dry, no jvd, weak voice Neuro: awakens to voice, global weakness but interacts appropriately CV: s1s2 RRR, PVC's noted on monitor, no m/r/g PULM:  Non-labored, lungs bilaterally diminished L>R, bronchial breath sounds on left GI: soft, bsx4 active  Extremities: warm/dry, trace LE edema  Skin: no rashes or lesions  RESOLVED PROBLEM LIST   ASSESSMENT AND PLAN    Pneumococcal Pneumonia with associated Parapneumonic Pleural Effusion  Urine strep antigen positive on admit.  Had been treated with Zpak prior to admit. Baseline poor cognitive status and poor cough mechanics / wheelchair bound. -continue rocephin, azithromycin as above, D3  -follow intermittent CXR -ultrasound left pleural space for possible thoracentesis 12/28 -trend PCT, lactic acid   Acute Hypoxemic Respiratory Failure  In setting of pneumococcal PNA -wean O2 for sats >90% -family ok for intubation  Underlying Cognitive Impairment Hx CVA, seizures, wheelchair at baseline -supportive care -minimize sedating medications -promote sleep / wake cycle   Hypernatremia / Uremia  Consistent with volume depletion   -gentle IVF  Hypokalemia  -monitor, replace as indicated  Acute AG Metabolic Acidosis In setting of pneumococcal PNA, mild ketosis / ? Starvation from poor intake with acute illness -follow labs -ensure adequate resuscitation  Hyperglycemia  -change IVF to [facility]  Best Practice / Goals of Care / Disposition.   DVT PROPHYLAXIS: Heparin SQ SUP: n/a NUTRITION: NPO MOBILITY:  As tolerated, WC bound at baseline  GOALS OF CARE: Full Code  FAMILY DISCUSSIONS: Daughter Danae Chen) called for update 12/28.   DISPOSITION: ICU  LABS  Glucose Recent Labs  Lab 09/17/19 0842  GLUCAP 236*    BMET Recent Labs  Lab 09/16/19 1600 09/17/19 0642  NA 147* 144  K 4.1 3.4*  CL  106 103  CO2 30 22  BUN 57* 41*  CREATININE 1.14* 0.71  GLUCOSE 125* 258*    Liver Enzymes Recent Labs  Lab 09/16/19 1600  AST 27  ALT 27  ALKPHOS 95  BILITOT 1.2  ALBUMIN 3.1*    Electrolytes Recent Labs  Lab 09/16/19 1600 09/17/19 0642  CALCIUM 9.2 8.0*    CBC Recent Labs  Lab 09/16/19 1600 09/17/19 0941  WBC 10.5 13.7*  HGB 12.8 12.7  HCT 41.2 41.4  PLT 266 268    ABG Recent Labs  Lab 09/16/19 1622 09/17/19 0822 09/18/19 0548  PHART 7.404 7.448 7.501*  PCO2ART 37.9 38.1 30.4*  PO2ART 82.0* 72.0* 147*    Coag's Recent Labs  Lab 09/16/19 1600  APTT 25  INR 1.1    Sepsis Markers Recent Labs  Lab 09/16/19 1600 09/16/19 1830 09/18/19 0259  LATICACIDVEN 1.6 1.4  --   PROCALCITON 2.01  --  0.60    Cardiac Enzymes No results for input(s): TROPONINI, PROBNP in the last 168 hours.    CC Time: n/a  Noe Gens, MSN, NP-C [place] [facility] 09/18/2019, 8:21 AM   Please see [email or web address] for pager details.

## 2019-09-18 NOTE — Progress Notes (Addendum)
eLink Physician-Brief Progress Note Patient Name: Doninique Pamplona DOB: Feb 06, 1951 MRN: [id number]   Date of Service  09/18/2019  HPI/Events of Note  Notified of increased o2 needs Currently on 100% fio2/50 liter flow + NRB mask However, sats are now 100% RR is in 20s Baseline poor mental status, so will not try BIPAP  eICU Interventions  Stat CXR and ABG now Attempt NTS  Brief trial to see if she improves If not, will need intubation  Asked bedside to call us with results     Intervention Category Major Interventions: Respiratory failure - evaluation and management  Margaretmary Lombard 09/18/2019, 5:40 AM   Addendum Signed out to Dr Elsworth Soho CXR and ABG needs follow up

## 2019-09-18 NOTE — Progress Notes (Signed)
Per CCM- try PT on other 02 device (besides Heated HFNC). Placed PT on [place] HFNC (not heated) at 15 lpm - titrated to 14 lpm current Sp02 98%- RN aware and will help titrate for Sp02 goal >=90%.

## 2019-09-18 NOTE — Progress Notes (Signed)
Pt NT sx'd x3 down right nare for moderate to large amount of thick tan secretions.  Pt also suctioned for large amount of thick secretions orally.  Pt tolerated procedure well.  RN assisted at bedside.

## 2019-09-18 NOTE — Progress Notes (Signed)
Pt started to have increased work of breathing and increasing oxygen needs. E-link & respiratory notified. Heated HFNC increased. Possible intubation discussed. E-link Dr ordered ABG, x-ray and NTS. Attempted to contact husband, but he did not answer. Will continue to monitor.

## 2019-09-19 ENCOUNTER — Inpatient Hospital Stay (HOSPITAL_COMMUNITY): Payer: Medicare Other

## 2019-09-19 DIAGNOSIS — J9601 Acute respiratory failure with hypoxia: Secondary | ICD-10-CM

## 2019-09-19 DIAGNOSIS — E43 Unspecified severe protein-calorie malnutrition: Secondary | ICD-10-CM

## 2019-09-19 DIAGNOSIS — L98421 Non-pressure chronic ulcer of back limited to breakdown of skin: Secondary | ICD-10-CM

## 2019-09-19 DIAGNOSIS — F0280 Dementia in other diseases classified elsewhere without behavioral disturbance: Secondary | ICD-10-CM

## 2019-09-19 DIAGNOSIS — A498 Other bacterial infections of unspecified site: Secondary | ICD-10-CM

## 2019-09-19 DIAGNOSIS — F05 Delirium due to known physiological condition: Secondary | ICD-10-CM

## 2019-09-19 LAB — COMPREHENSIVE METABOLIC PANEL
ALT: 37 U/L (ref 0–44)
AST: 80 U/L — ABNORMAL HIGH (ref 15–41)
Albumin: 3.1 g/dL — ABNORMAL LOW (ref 3.5–5.0)
Alkaline Phosphatase: 241 U/L — ABNORMAL HIGH (ref 38–126)
Anion gap: 13 (ref 5–15)
BUN: 24 mg/dL — ABNORMAL HIGH (ref 8–23)
CO2: 24 mmol/L (ref 22–32)
Calcium: 8.5 mg/dL — ABNORMAL LOW (ref 8.9–10.3)
Chloride: 107 mmol/L (ref 98–111)
Creatinine, Ser: 0.57 mg/dL (ref 0.44–1.00)
GFR calc Af Amer: >60 mL/min (ref >60)
GFR calc non Af Amer: >60 mL/min (ref >60)
Glucose, Bld: 93 mg/dL (ref 70–99)
Potassium: 4 mmol/L (ref 3.5–5.1)
Sodium: 144 mmol/L (ref 135–145)
Total Bilirubin: 1 mg/dL (ref 0.3–1.2)
Total Protein: 5.5 g/dL — ABNORMAL LOW (ref 6.5–8.1)

## 2019-09-19 LAB — PROCALCITONIN: Procalcitonin: 0.4 ng/mL

## 2019-09-19 LAB — MAGNESIUM
Magnesium: 1.8 mg/dL (ref 1.7–2.4)
Magnesium: 1.8 mg/dL (ref 1.7–2.4)

## 2019-09-19 LAB — CBC
HCT: 33.1 % — ABNORMAL LOW (ref 36.0–46.0)
Hemoglobin: 10.3 g/dL — ABNORMAL LOW (ref 12.0–15.0)
MCH: 29.9 pg (ref 26.0–34.0)
MCHC: 31.1 g/dL (ref 30.0–36.0)
MCV: 95.9 fL (ref 80.0–100.0)
Platelets: 182 10*3/uL (ref 150–400)
RBC: 3.45 MIL/uL — ABNORMAL LOW (ref 3.87–5.11)
RDW: 13.5 % (ref 11.5–15.5)
WBC: 18.2 10*3/uL — ABNORMAL HIGH (ref 4.0–10.5)
nRBC: 0 % (ref 0.0–0.2)

## 2019-09-19 LAB — GLUCOSE, CAPILLARY
Glucose-Capillary: 104 mg/dL — ABNORMAL HIGH (ref 70–99)
Glucose-Capillary: 107 mg/dL — ABNORMAL HIGH (ref 70–99)
Glucose-Capillary: 124 mg/dL — ABNORMAL HIGH (ref 70–99)
Glucose-Capillary: 77 mg/dL (ref 70–99)
Glucose-Capillary: 81 mg/dL (ref 70–99)
Glucose-Capillary: 92 mg/dL (ref 70–99)

## 2019-09-19 LAB — PHOSPHORUS
Phosphorus: 2 mg/dL — ABNORMAL LOW (ref 2.5–4.6)
Phosphorus: 2 mg/dL — ABNORMAL LOW (ref 2.5–4.6)

## 2019-09-19 LAB — PH, BODY FLUID: pH, Body Fluid: 7.6

## 2019-09-19 MED ORDER — PRO-STAT SUGAR FREE PO LIQD
30.0000 mL | Freq: Two times a day (BID) | ORAL | Status: DC
  Administered 2019-09-19 – 2019-09-25 (×13): 30 mL
  Filled 2019-09-19 (×12): qty 30

## 2019-09-19 MED ORDER — GERHARDT'S BUTT CREAM
TOPICAL_CREAM | Freq: Two times a day (BID) | CUTANEOUS | Status: DC
  Administered 2019-09-24 – 2019-10-20 (×13): 1 via TOPICAL
  Filled 2019-09-19 (×2): qty 1

## 2019-09-19 MED ORDER — COLLAGENASE 250 UNIT/GM EX OINT
TOPICAL_OINTMENT | Freq: Every day | CUTANEOUS | Status: AC
  Administered 2019-09-27 – 2019-09-30 (×2): 1 via TOPICAL
  Filled 2019-09-19: qty 30

## 2019-09-19 MED ORDER — OSMOLITE 1.5 CAL PO LIQD
1000.0000 mL | ORAL | Status: AC
  Administered 2019-09-19 – 2019-09-25 (×6): 1000 mL
  Filled 2019-09-19 (×10): qty 1000

## 2019-09-19 MED ORDER — FREE WATER
100.0000 mL | Freq: Four times a day (QID) | Status: DC
  Administered 2019-09-19 – 2019-09-29 (×40): 100 mL

## 2019-09-19 MED ORDER — VITAL HIGH PROTEIN PO LIQD: 1000.0000 mL | ORAL | Status: DC

## 2019-09-19 NOTE — Progress Notes (Signed)
Initial Nutrition Assessment  DOCUMENTATION CODES:   Non-severe (moderate) malnutrition in context of chronic illness  INTERVENTION:  - will order TF regimen: Osmolite 1.5 @ 20 ml/hr advance by 10 ml every 4 hours to reach goal rate of 50 ml/hr with 30 ml prostat BID and 100 ml free water QID.  - will advance more slowly d/t unknown oral intakes recently.  - at goal rate, this regimen will provide 2000 kcal, 105 grams protein, and 1314 ml free water.   NUTRITION DIAGNOSIS:   Moderate Malnutrition related to chronic illness(wheelchair bound x3 years following CVA) as evidenced by mild fat depletion, mild muscle depletion.  GOAL:   Patient will meet greater than or equal to 90% of their needs  MONITOR:   TF tolerance, Labs, Weight trends, Skin  REASON FOR ASSESSMENT:   Malnutrition Screening Tool, Consult Enteral/tube feeding initiation and management  ASSESSMENT:   68 year-old female with medical history of breast cancer s/p bone marrow transplant early 1990s, problems with sundowning/dementia, and wheelchair bound x3 years following CVA. She presented to the ED due to 1 week of worsening cough and congestion. She was also experiencing poor oral intake, AMS, and increased weakness.  Patient sleeping at time of RD visit and only briefly awoke during NFPE. She was not able to keep eyes open long enough for RD to attempt to ask any questions. Flow sheet documentation indicates that she is a/o to self only. NGT placed earlier this AM; will order TF as outlined above.   Per chart review, weight this AM was 131 lb. Weight on date of admission (12/26) was 150 lb (appears to be a stated weight), weight on 12/27 was 121 lb, and weight on 12/28 was 125 lb. PTA, most recently documented weight [facility] at [facility] on 10/08/17 when she weighed 149 lb.   Per notes: - acute hypoxemic respiratory failure in setting of PNA--on 4L [place] and family ok with intubation if needed - bacteremia - underlying  cognitive impairment with acute delirium  - hypernatremia--resolved - hypokalemia--resolved s/p repletion - acute metabolic acidosis - wounds POA   Labs reviewed; CBGs: 104 and 92 mg/dl, Ca: 8.5 mg/dl, Alk Phos elevated. Medications reviewed; 25 g albumin x4 doses 12/28, sliding scale novolog, 10 mEq IV KCl x4 runs 12/28.     NUTRITION - FOCUSED PHYSICAL EXAM:    Most Recent Value  Orbital Region  No depletion  Upper Arm Region  Mild depletion  Thoracic and Lumbar Region  Mild depletion  Buccal Region  Mild depletion  Temple Region  Mild depletion  Clavicle Bone Region  Mild depletion  Clavicle and Acromion Bone Region  Mild depletion  Scapular Bone Region  Unable to assess  Dorsal Hand  No depletion  Patellar Region  No depletion  Anterior Thigh Region  Unable to assess  Posterior Calf Region  Mild depletion  Edema (RD Assessment)  Mild  Hair  Reviewed  Eyes  Unable to assess  Mouth  Unable to assess  Skin  Reviewed  Nails  Reviewed       Diet Order:   Diet Order            Diet NPO time specified  Diet effective now              EDUCATION NEEDS:   Not appropriate for education at this time  Skin:  Skin Assessment: Skin Integrity Issues: Skin Integrity Issues:: Stage I, Stage II, Stage III Stage I: upper L back Stage II:  L buttocks and L thigh Stage III: sacrum  Last BM:  PTA/unknown  Height:   Ht Readings from Last 1 Encounters:  09/16/19 5' 8"  (1.727 m)    Weight:   Wt Readings from Last 1 Encounters:  09/19/19 59.3 kg    Ideal Body Weight:  63.6 kg  BMI:  Body mass index is 19.88 kg/m.  Estimated Nutritional Needs:   Kcal:  1900-2100 kcal  Protein:  90-105 grams  Fluid:  >/= 2 L/day     Jarome Matin, MS, RD, LDN, [facility] Inpatient Clinical Dietitian Pager # [phone] After hours/weekend pager # [phone]

## 2019-09-19 NOTE — Consult Note (Signed)
WOC Nurse Consult Note: Reason for Consult:Unstageable pressure injury to sacrum, present on admission.  Moisture associated skin damage to bilateral gluteal folds and posterior thighs.  Purewick in place with incontinence managed.  Skin is clean and dry.  COntact with dermatherapy linen should improve skin microclimate.  Wound type:moisture and pressure.  Pressure Injury POA: Yes Measurement: 3 cm x 2 cm slough to wound bed.  Scattered pink nonintact denuded lesions to bilateral gluteal folds and posterior thighs.  Wound bed:see above Drainage (amount, consistency, odor) minimal serosanguinous  No odor.  Periwound:intact Dressing procedure/placement/frequency: Cleanse wounds to bilateral buttocks and thighs with soap and water and pat dry. Apply Gerhadts butt paste twice daily and PRN soilage.  No disposable briefs or underpads.  Cleanse sacral wound with NS and pat dry  Apply Santyl to wound bed.  Cover with NS moist gauze.  Secure with silicone sacral foam.  Peel back foam to change Santyl dressing daily and change foam dressing every three days and PRN soilage.  Will not follow at this time.  Please re-consult if needed.  Domenic Moras MSN, RN, FNP-BC CWON Wound, Ostomy, Continence Nurse Pager [phone]

## 2019-09-19 NOTE — Progress Notes (Signed)
Patient resting comfortably at this time. RT will attempt CPT at later time.

## 2019-09-19 NOTE — Progress Notes (Signed)
eLink Physician-Brief Progress Note Patient Name: Barbara Flowers DOB: 13-Feb-1951 MRN: [id number]   Date of Service  09/19/2019  HPI/Events of Note  Pt having difficulty mobilizing secretions, CXR shows left lower lobe consolidation, patient is maintaining her saturation on 2 liters of oxygen.  eICU Interventions  Bed Physiotherapy ordered to help mobilize secretions.        Kerry Kass [name] 09/19/2019, 5:19 AM

## 2019-09-19 NOTE — Progress Notes (Signed)
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   SIGNIFICANT PAST MEDICAL HISTORY   Anemia Anxiety Disorder Blood transfusion Brain Cancer/Tumor  Breast Cancer  Hx Bone Marrow Transplant Hypothyroidism IBS Depression Memory Loss  CVA Seizures SDH Upper bracial plexus paralysis syndrome  Urinary incontinence   SIGNIFICANT EVENTS:  12/26 Admit  12/28 Respiratory distress, HFNC  STUDIES:      Micro:  Covid 19 PCR 12/26 >> neg RVP 12/26 >> neg  Procalcitonin 12/26 >> 2.01, down trended to 0.4 with abx BCx2 12/26 >> Pan sensitive Proteus Mirabilis UC 12/26 >> negative Urine legionella 12/26 >>   Urine Pneum Ag 12/26 >> positive  ANTIBIOTICS:  Zpak 12/21 to 12/26 (outpatient) Azithro 12/27 >>   Rocephin 12/26 >>   LINES/TUBES:     CONSULTANTS:     SUBJECTIVE:  Denies pain. No overnight issues. Down to [facility].   Objective:  CONSTITUTIONAL: BP 103/63   Pulse 76   Temp 99.9 F (37.7 C) (Oral)   Resp (!) 23   Ht 5\' 8"  (1.727 m)   Wt 59.3 kg   SpO2 99%   BMI 19.88 kg/m   I/O last 3 completed shifts: In: 4977.4 [I.V.:3398; IV Piggyback:1579.4] Out: 700 [Urine:700]        PHYSICAL EXAM: General: frail adult female lying in bed in NAD on nasal cannula HEENT: MM pink/dry,  no jvd, weak voice Neuro: awakens to voice, global weakness, tells me her name and follows commands CV: s1s2 RRR, PVC's noted on monitor, no m/r/g PULM:  Non-labored, lungs bilaterally diminished L>R, bronchial breath sounds on left GI: soft, bsx4 active  Extremities: warm/dry, trace LE edema  Skin: sacral wounds, POA  RESOLVED PROBLEM LIST   ASSESSMENT AND PLAN    Acute Hypoxemic Respiratory Failure  In setting of pneumococcal PNA -wean O2 for sats >90%. Now on 4LNC -family ok for intubation  Pneumococcal Pneumonia with associated Parapneumonic Pleural Effusion  Urine strep antigen positive on admit.  Had been treated with Zpak prior to admit. Baseline poor cognitive status and poor cough mechanics / wheelchair bound. -continue rocephin Day 4 -follow intermittent CXR -ultrasound left pleural space for possible thoracentesis 12/28  Proteus Mirabilis Bacteremia - 2/2 blood cultures from 12/26, resulted 12/29.  - pansensitive  Underlying Cognitive Impairment with acute Delirium, hypoactive Hx CVA, seizures, wheelchair at baseline -supportive care -minimize sedating medications -promote sleep / wake cycle   Hypernatremia / Uremia  Consistent with volume depletion   -gentle IVF  Hypokalemia  -monitor, replace as indicated  Acute AG Metabolic Acidosis In setting of pneumococcal PNA, mild ketosis / ? Starvation from poor intake with acute illness -improving with fluid resuscitation, will d/c now that we are starting TF.   Sacral wounds -  POA - wound care consult  Severe Protein Calorie Malnutrition Nutrition consult, start TF  Best Practice / Goals of Care / Disposition.   DVT PROPHYLAXIS: Heparin SQ SUP: n/a NUTRITION: NPO, will need to insert NG tube for enteral feeding today.  MOBILITY:  As tolerated, WC bound at baseline. Will get PT/OT on board today.  GOALS OF CARE: Full Code  FAMILY DISCUSSIONS: daughter updated 12/28 DISPOSITION: can downgrade to step  down.  LABS  Glucose Recent Labs  Lab 09/18/19 1236 09/18/19 1555 09/18/19 1946 09/18/19 2321 09/19/19 0337 09/19/19 0809  GLUCAP 169* 116* 96 94 104* 92    BMET Recent Labs  Lab 09/16/19 1600 09/17/19 0642 09/19/19 0235  NA 147* 144 144  K 4.1 3.4* 4.0  CL 106 103 107  CO2 30 22 24   BUN 57* 41* 24*  CREATININE 1.14* 0.71 0.57  GLUCOSE 125* 258* 93    Liver Enzymes Recent Labs  Lab 09/16/19 1600 09/19/19 0235  AST 27 80*  ALT 27 37  ALKPHOS 95 241*  BILITOT 1.2 1.0  ALBUMIN 3.1* 3.1*    Electrolytes Recent Labs  Lab 09/16/19 1600 09/17/19 0642 09/19/19 0235  CALCIUM 9.2 8.0* 8.5*    CBC Recent Labs  Lab 09/16/19 1600 09/17/19 0941 09/19/19 0235  WBC 10.5 13.7* 18.2*  HGB 12.8 12.7 10.3*  HCT 41.2 41.4 33.1*  PLT 266 268 182    ABG Recent Labs  Lab 09/16/19 1622 09/17/19 0822 09/18/19 0548  PHART 7.404 7.448 7.501*  PCO2ART 37.9 38.1 30.4*  PO2ART 82.0* 72.0* 147*    Coag's Recent Labs  Lab 09/16/19 1600  APTT 25  INR 1.1    Sepsis Markers Recent Labs  Lab 09/16/19 1600 09/16/19 1830 09/18/19 0259 09/19/19 0509  LATICACIDVEN 1.6 1.4  --   --   PROCALCITON 2.01  --  0.60 0.40

## 2019-09-20 ENCOUNTER — Inpatient Hospital Stay (HOSPITAL_COMMUNITY): Payer: Medicare Other

## 2019-09-20 DIAGNOSIS — Z4659 Encounter for fitting and adjustment of other gastrointestinal appliance and device: Secondary | ICD-10-CM

## 2019-09-20 LAB — GLUCOSE, CAPILLARY
Glucose-Capillary: 158 mg/dL — ABNORMAL HIGH (ref 70–99)
Glucose-Capillary: 198 mg/dL — ABNORMAL HIGH (ref 70–99)
Glucose-Capillary: 122 mg/dL — ABNORMAL HIGH (ref 70–99)
Glucose-Capillary: 117 mg/dL — ABNORMAL HIGH (ref 70–99)
Glucose-Capillary: 150 mg/dL — ABNORMAL HIGH (ref 70–99)
Glucose-Capillary: 120 mg/dL — ABNORMAL HIGH (ref 70–99)

## 2019-09-20 LAB — MAGNESIUM
Magnesium: 1.8 mg/dL (ref 1.7–2.4)
Magnesium: 1.7 mg/dL (ref 1.7–2.4)

## 2019-09-20 LAB — PHOSPHORUS
Phosphorus: 1.6 mg/dL — ABNORMAL LOW (ref 2.5–4.6)
Phosphorus: 1.7 mg/dL — ABNORMAL LOW (ref 2.5–4.6)

## 2019-09-20 LAB — CULTURE, BLOOD (ROUTINE X 2): Special Requests: ADEQUATE

## 2019-09-20 MED ORDER — LABETALOL HCL 5 MG/ML IV SOLN
5.0000 mg | INTRAVENOUS | Status: DC | PRN
  Administered 2019-09-20 – 2019-09-27 (×4): 5 mg via INTRAVENOUS
  Filled 2019-09-20 (×5): qty 4

## 2019-09-20 NOTE — Progress Notes (Signed)
Patient heart rate 150-200's. MD paged. Orders for 5mg  of Labetolol given. Patient asymptomatic.

## 2019-09-20 NOTE — Evaluation (Signed)
Physical Therapy Evaluation Patient Details Name: Barbara Flowers MRN: [id number] DOB: Apr 14, 1951 Today's Date: 09/20/2019   History of Present Illness  68 yo female admitted with weakness. Hx of CVA with L side residual weakness, dementia, Parkinsonism, Sz, Breat cancer, Brain tumor, sacral wounds, AVM  Clinical Impression  On eval, pt required Max assist +2 for bed mobility. She sat EOB with Min assist for static sitting balance. Pt responded mostly with "yes" or "no" head shakes. No family was present during session. Unsure of PLOF info beyond pt being wheelchair bound. Will follow during hospital stay and progress activity as tolerated.     Follow Up Recommendations SNF;Home health PT;Supervision/Assistance - 24 hour(depending on family's decision and pt progress)    Equipment Recommendations  None recommended by PT    Recommendations for Other Services       Precautions / Restrictions Precautions Precautions: Fall Precaution Comments: L side residual weakness, feeding tube NG Restrictions Weight Bearing Restrictions: No      Mobility  Bed Mobility Overal bed mobility: Needs Assistance Bed Mobility: Supine to Sit;Sit to Supine     Supine to sit: Max assist;+2 for physical assistance;+2 for safety/equipment;HOB elevated Sit to supine: Max assist;+2 for physical assistance;+2 for safety/equipment;HOB elevated   General bed mobility comments: Assist for trunk and bil LEs. Utilized bedpad for scooting, postioning. Pt sat EOB for at least 10 minutes with brief periods of Min guard assist but mostly Min assist.  Transfers                    Ambulation/Gait                Stairs            Wheelchair Mobility    Modified Rankin (Stroke Patients Only)       Balance Overall balance assessment: Needs assistance Sitting-balance support: Bilateral upper extremity supported Sitting balance-Leahy Scale: Poor Sitting balance - Comments: Assist to  stabilize in static sitting. Postural control: Left lateral lean                                   Pertinent Vitals/Pain Pain Assessment: No/denies pain    Home Living Family/patient expects to be discharged to:: Unsure Living Arrangements: Spouse/significant other Available Help at Discharge: Family           Home Equipment: Wheelchair - manual      Prior Function Level of Independence: Needs assistance   Gait / Transfers Assistance Needed: WC bound per chart  ADL's / Homemaking Assistance Needed: Assist for ADLs        Hand Dominance        Extremity/Trunk Assessment   Upper Extremity Assessment Upper Extremity Assessment: Defer to OT evaluation    Lower Extremity Assessment Lower Extremity Assessment: Generalized weakness;LLE deficits/detail LLE Deficits / Details: knee flexion contracture ~20 degrees    Cervical / Trunk Assessment Cervical / Trunk Assessment: Kyphotic  Communication   Communication: Expressive difficulties  Cognition Arousal/Alertness: Awake/alert Behavior During Therapy: WFL for tasks assessed/performed Overall Cognitive Status: Difficult to assess                                 General Comments: pt mostly shook head "yes" or "no"      General Comments      Exercises     Assessment/Plan  PT Assessment Patient needs continued PT services  PT Problem List Decreased strength;Decreased mobility;Decreased range of motion;Decreased activity tolerance;Decreased balance;Decreased knowledge of use of DME;Decreased cognition;Decreased skin integrity       PT Treatment Interventions DME instruction;Gait training;Therapeutic exercise;Therapeutic activities;Patient/family education;Balance training;Functional mobility training    PT Goals (Current goals can be found in the Care Plan section)  Acute Rehab PT Goals Patient Stated Goal: none stated PT Goal Formulation: With patient Time For Goal  Achievement: 10/04/19 Potential to Achieve Goals: Fair    Frequency Min 2X/week   Barriers to discharge        Co-evaluation   Reason for Co-Treatment: Complexity of the patient's impairments (multi-system involvement);For patient/therapist safety;To address functional/ADL transfers   OT goals addressed during session: ADL's and self-care       AM-PAC PT "6 Clicks" Mobility  Outcome Measure Help needed turning from your back to your side while in a flat bed without using bedrails?: A Lot Help needed moving from lying on your back to sitting on the side of a flat bed without using bedrails?: Total Help needed moving to and from a bed to a chair (including a wheelchair)?: Total Help needed standing up from a chair using your arms (e.g., wheelchair or bedside chair)?: Total Help needed to walk in hospital room?: Total Help needed climbing 3-5 steps with a railing? : Total 6 Click Score: 7    End of Session   Activity Tolerance: Patient tolerated treatment well Patient left: in bed;with call bell/phone within reach;with bed alarm set   PT Visit Diagnosis: Muscle weakness (generalized) (M62.81);Hemiplegia and hemiparesis;History of falling (Z91.81);Difficulty in walking, not elsewhere classified (R26.2);Other abnormalities of gait and mobility (R26.89) Hemiplegia - Right/Left: Left Hemiplegia - caused by: Cerebral infarction    Time: [id number] PT Time Calculation (min) (ACUTE ONLY): 21 min   Charges:   PT Evaluation $PT Eval Moderate Complexity: 1 Mod             Damiana Berrian P, PT Acute Rehabilitation

## 2019-09-20 NOTE — Progress Notes (Signed)
Patient signed out to [facility] on 12/29 - discussed with Dr. Wyline Copas, they will assume care today. Now on room air. Ok for [facility].   PCCM will sign off, please don't hesitate to contact us if we can be of further assistance.

## 2019-09-20 NOTE — Progress Notes (Signed)
PROGRESS NOTE    Barbara Flowers  [id number] DOB: 04-24-1951 DOA: 09/16/2019 PCP: [name], NP    Brief NarrativeBP:[id number] with hx breast cancer, dementia, hx prior CVA presented on 12/26 with extensive L sided infiltrate and effusion with hypoxemia, requiring nonrebreather. Pt was initially admitted to [facility]:   Active Problems:   Dementia ([place])   CAP (community acquired pneumonia)   Pleural effusion on left   Acute prerenal azotemia   Hypernatremia   Pressure injury of skin   1. Acute hypoxemic respiratory failure 1. Presented initially NRB dependent, now weaned to room air 2. Pt s/p thoracentesis on 12/28 yielding 200cc fluid 3. Discussed with PCCM. Recommendation for cont abx, currently on rocephin 2. Tachycardia 1. Intermittent but new findings today 2. Correct electrolytes 3. Will give trial of PRN labetalol with hold parameters 3. Pneumococcal PNA with pleural effusion 1. Per above, pt is continued on rocephin and is s/p thoracentesis yielding 200cc fluid 2. On minimal O2 support, however remains with tachypnea to the mid-20 to 30's 3. Continuing on rocephin 4. Proteus bacteremia 1. Blood cx from 12/26 with pan-sensitive proteus species 2. Pt is continued on rocephin per above 3. Repeat CBC in AM 5. Acute toxic metabolic encephalopathy 1. Seems to be conversing appropriately this AM 2. Continue abx and supportive care 6. Hypernatremia 1. Resolved as of 12/29 2. Repeat bmet in AM 7. Hypokalemia 1. Normalized as of 12/29 8. Metabolic acidosis 1. Resolved as of 12/29 and will repeat bmet in AM 9. Unstageable sacral decub ulcer POA 1. Seen by [facility] 10. Moderate protein calorie malnutrition 1. Nutrition following. On tube feeding  DVT prophylaxis: heparin subq Code Status: Full Family Communication: Pt in room, family not at bedside Disposition Plan: Uncertain at this time  Consultants:   Critical Care  Procedures:    Bedside thoracentesis 12/28  Antimicrobials: Anti-infectives (From admission, onward)   Start     Dose/Rate Route Frequency Ordered Stop   09/17/19 1000  azithromycin (ZITHROMAX) 500 mg in sodium chloride 0.9 % 250 mL IVPB  Status:  Discontinued     500 mg 250 mL/hr over 60 Minutes Intravenous Every 24 hours 09/17/19 0920 09/18/19 1157   09/16/19 2200  cefTRIAXone (ROCEPHIN) 2 g in sodium chloride 0.9 % 100 mL IVPB     2 g 200 mL/hr over 30 Minutes Intravenous Every 24 hours 09/16/19 1745     09/16/19 1745  cefTRIAXone (ROCEPHIN) injection 1 g  Status:  Discontinued     1 g Intramuscular Every 24 hours 09/16/19 1740 09/16/19 1743   09/16/19 1515  ceFEPIme (MAXIPIME) 2 g in sodium chloride 0.9 % 100 mL IVPB     2 g 200 mL/hr over 30 Minutes Intravenous  Once 09/16/19 1513 09/16/19 1834   09/16/19 1515  metroNIDAZOLE (FLAGYL) IVPB 500 mg  Status:  Discontinued     500 mg 100 mL/hr over 60 Minutes Intravenous  Once 09/16/19 1513 09/16/19 1741   09/16/19 1515  vancomycin (VANCOCIN) IVPB 1000 mg/200 mL premix  Status:  Discontinued     1,000 mg 200 mL/hr over 60 Minutes Intravenous  Once 09/16/19 1513 09/16/19 1834       Subjective: Without complaints this AM. Later in afternoon, pt noted to be tachycardic  Objective: Vitals:   09/20/19 1100 09/20/19 1200 09/20/19 1300 09/20/19 1600  BP:  111/71    Pulse: 97 87 82   Resp:  (!) 24 (!) 25   Temp:  99.4 F (37.4 C)  99.2 F (37.3 C)  TempSrc:  Oral  Axillary  SpO2: 96% 96% 95%   Weight:      Height:        Intake/Output Summary (Last 24 hours) at 09/20/2019 1826 Last data filed at 09/20/2019 0507 Gross per 24 hour  Intake 495.58 ml  Output 125 ml  Net 370.58 ml   Filed Weights   09/18/19 0452 09/19/19 0345 09/20/19 0448  Weight: 56.5 kg 59.3 kg 58 kg    Examination:  General exam: Appears calm and comfortable  Respiratory system: Clear to auscultation. Respiratory effort normal. Cardiovascular system: S1 & S2  heard, Regular Gastrointestinal system: Abdomen is nondistended, soft and nontender. No organomegaly or masses felt. Normal bowel sounds heard. Central nervous system: Alert and oriented. No focal neurological deficits. Extremities: Symmetric 5 x 5 power. Skin: No rashes, lesions Psychiatry: Unable to assess given current mentation.   Data Reviewed: I have personally reviewed following labs and imaging studies  CBC: Recent Labs  Lab 09/16/19 1600 09/17/19 0941 09/19/19 0235  WBC 10.5 13.7* 18.2*  NEUTROABS 7.5 11.7*  --   HGB 12.8 12.7 10.3*  HCT 41.2 41.4 33.1*  MCV 98.1 98.1 95.9  PLT 266 268 [id number]   Basic Metabolic Panel: Recent Labs  Lab 09/16/19 1600 09/17/19 0642 09/19/19 0235 09/19/19 1318 09/19/19 1800 09/20/19 0906 09/20/19 1648  NA 147* 144 144  --   --   --   --   K 4.1 3.4* 4.0  --   --   --   --   CL 106 103 107  --   --   --   --   CO2 30 22 24   --   --   --   --   GLUCOSE 125* 258* 93  --   --   --   --   BUN 57* 41* 24*  --   --   --   --   CREATININE 1.14* 0.71 0.57  --   --   --   --   CALCIUM 9.2 8.0* 8.5*  --   --   --   --   MG  --   --   --  1.8 1.8 1.8 1.7  PHOS  --   --   --  2.0* 2.0* 1.6* 1.7*   GFR: Estimated Creatinine Clearance: 60.8 mL/min (by C-G formula based on SCr of 0.57 mg/dL). Liver Function Tests: Recent Labs  Lab 09/16/19 1600 09/19/19 0235  AST 27 80*  ALT 27 37  ALKPHOS 95 241*  BILITOT 1.2 1.0  PROT 7.8 5.5*  ALBUMIN 3.1* 3.1*   No results for input(s): LIPASE, AMYLASE in the last 168 hours. No results for input(s): AMMONIA in the last 168 hours. Coagulation Profile: Recent Labs  Lab 09/16/19 1600  INR 1.1   Cardiac Enzymes: No results for input(s): CKTOTAL, CKMB, CKMBINDEX, TROPONINI in the last 168 hours. BNP (last 3 results) No results for input(s): PROBNP in the last 8760 hours. HbA1C: Recent Labs    09/18/19 1330  HGBA1C 5.6   CBG: Recent Labs  Lab 09/19/19 1142 09/19/19 1615 09/19/19 1941  09/19/19 2338 09/20/19 0433  GLUCAP 77 107* 81 124* 158*   Lipid Profile: No results for input(s): CHOL, HDL, LDLCALC, TRIG, CHOLHDL, LDLDIRECT in the last 72 hours. Thyroid Function Tests: No results for input(s): TSH, T4TOTAL, FREET4, T3FREE, THYROIDAB in the last 72 hours. Anemia Panel: No results for input(s): VITAMINB12,  FOLATE, FERRITIN, TIBC, IRON, RETICCTPCT in the last 72 hours. Sepsis Labs: Recent Labs  Lab 09/16/19 1600 09/16/19 1830 09/18/19 0259 09/19/19 0509  PROCALCITON 2.01  --  0.60 0.40  LATICACIDVEN 1.6 1.4  --   --     Recent Results (from the past 240 hour(s))  Blood Culture (routine x 2)     Status: None (Preliminary result)   Collection Time: 09/16/19  4:00 PM   Specimen: BLOOD  Result Value Ref Range Status   Specimen Description   Final    BLOOD RIGHT ANTECUBITAL Performed at [facility] [address], [place], [place] [address]    Special Requests   Final    BOTTLES DRAWN AEROBIC AND ANAEROBIC Blood Culture results may not be optimal due to an inadequate volume of blood received in culture bottles Performed at [facility] [address], [place], [place] [address]    Culture   Final    NO GROWTH 4 DAYS Performed at [facility], [place] [address] [place], [place] [address]    Report Status PENDING  Incomplete  Blood Culture (routine x 2)     Status: Abnormal   Collection Time: 09/16/19  4:01 PM   Specimen: BLOOD RIGHT FOREARM  Result Value Ref Range Status   Specimen Description   Final    BLOOD RIGHT FOREARM Performed at [facility] [address], [place], [place] [address]    Special Requests   Final    BOTTLES DRAWN AEROBIC AND ANAEROBIC Blood Culture adequate volume Performed at [facility] [address], [place], [place] [address]    Culture  Setup Time   Final    Lonell Grandchild NEGATIVE RODS GRAM POSITIVE COCCI AEROBIC BOTTLE ONLY CRITICAL RESULT  CALLED TO, READ BACK BY AND VERIFIED WITH: PHARMD D WOFFARD [id number] AT 1440 BY CM    Culture (A)  Final    PROTEUS MIRABILIS STAPHYLOCOCCUS SPECIES (COAGULASE NEGATIVE) ORGANISM 2 THE SIGNIFICANCE OF ISOLATING THIS ORGANISM FROM A SINGLE SET OF BLOOD CULTURES WHEN MULTIPLE SETS ARE DRAWN IS UNCERTAIN. PLEASE NOTIFY THE MICROBIOLOGY DEPARTMENT WITHIN ONE WEEK IF SPECIATION AND SENSITIVITIES ARE REQUIRED. Performed at [facility], [place] [address], [place],  [address]    Report Status 09/20/2019 FINAL  Final   Organism ID, Bacteria PROTEUS MIRABILIS  Final      Susceptibility   Proteus mirabilis - MIC*    AMPICILLIN <=2 SENSITIVE Sensitive     CEFAZOLIN <=4 SENSITIVE Sensitive     CEFEPIME <=1 SENSITIVE Sensitive     CEFTAZIDIME <=1 SENSITIVE Sensitive     CEFTRIAXONE <=1 SENSITIVE Sensitive     CIPROFLOXACIN <=0.25 SENSITIVE Sensitive     GENTAMICIN <=1 SENSITIVE Sensitive     IMIPENEM 1 SENSITIVE Sensitive     TRIMETH/SULFA <=20 SENSITIVE Sensitive     AMPICILLIN/SULBACTAM <=2 SENSITIVE Sensitive     PIP/TAZO <=4 SENSITIVE Sensitive     * PROTEUS MIRABILIS  Blood Culture ID Panel (Reflexed)     Status: Abnormal   Collection Time: 09/16/19  4:01 PM  Result Value Ref Range Status   Enterococcus species NOT DETECTED NOT DETECTED Final   Listeria monocytogenes NOT DETECTED NOT DETECTED Final   Staphylococcus species DETECTED (A) NOT DETECTED Final    Comment: Methicillin (oxacillin) resistant coagulase negative staphylococcus. Possible blood culture contaminant (unless isolated from more than one blood culture draw or clinical case suggests pathogenicity). No antibiotic treatment is indicated for  blood  culture contaminants. CRITICAL RESULT CALLED TO, READ BACK BY AND VERIFIED WITH: PHARMD D WAFFORD [id number] AT 1440 BY CM    Staphylococcus aureus (BCID) NOT DETECTED NOT DETECTED Final   Methicillin resistance DETECTED (A) NOT DETECTED Final    Comment: CRITICAL RESULT CALLED  TO, READ BACK BY AND VERIFIED WITH: PHARMD D WOFFORD [id number] AT 1440 BY CM    Streptococcus species NOT DETECTED NOT DETECTED Final   Streptococcus agalactiae NOT DETECTED NOT DETECTED Final   Streptococcus pneumoniae NOT DETECTED NOT DETECTED Final   Streptococcus pyogenes NOT DETECTED NOT DETECTED Final   Acinetobacter baumannii NOT DETECTED NOT DETECTED Final   Enterobacteriaceae species DETECTED (A) NOT DETECTED Final    Comment: Enterobacteriaceae represent a large family of gram-negative bacteria, not a single organism. CRITICAL RESULT CALLED TO, READ BACK BY AND VERIFIED WITH: PHARMD D WAFFORD [id number] AT 1440 BY CM    Enterobacter cloacae complex NOT DETECTED NOT DETECTED Final   Escherichia coli NOT DETECTED NOT DETECTED Final   Klebsiella oxytoca NOT DETECTED NOT DETECTED Final   Klebsiella pneumoniae NOT DETECTED NOT DETECTED Final   Proteus species DETECTED (A) NOT DETECTED Final    Comment: CRITICAL RESULT CALLED TO, READ BACK BY AND VERIFIED WITH: PHARMD D WAFFORD [id number] AT 1440 BY CM    Serratia marcescens NOT DETECTED NOT DETECTED Final   Carbapenem resistance NOT DETECTED NOT DETECTED Final   Haemophilus influenzae NOT DETECTED NOT DETECTED Final   Neisseria meningitidis NOT DETECTED NOT DETECTED Final   Pseudomonas aeruginosa NOT DETECTED NOT DETECTED Final   Candida albicans NOT DETECTED NOT DETECTED Final   Candida glabrata NOT DETECTED NOT DETECTED Final   Candida krusei NOT DETECTED NOT DETECTED Final   Candida parapsilosis NOT DETECTED NOT DETECTED Final   Candida tropicalis NOT DETECTED NOT DETECTED Final    Comment: Performed at [facility], [place] [address] [place], [place] [address]  Respiratory Panel by RT PCR (Flu A&B, Covid) - Nasopharyngeal Swab     Status: None   Collection Time: 09/16/19  4:42 PM   Specimen: Nasopharyngeal Swab  Result Value Ref Range Status   SARS Coronavirus 2 by RT PCR NEGATIVE NEGATIVE Final    Comment: (NOTE) SARS-CoV-2  target nucleic acids are NOT DETECTED. The SARS-CoV-2 RNA is generally detectable in upper respiratoy specimens during the acute phase of infection. The lowest concentration of SARS-CoV-2 viral copies this assay can detect is 131 copies/mL. A negative result does not preclude SARS-Cov-2 infection and should not be used as the sole basis for treatment or other patient management decisions. A negative result may occur with  improper specimen collection/handling, submission of specimen other than nasopharyngeal swab, presence of viral mutation(s) within the areas targeted by this assay, and inadequate number of viral copies (<131 copies/mL). A negative result must be combined with clinical observations, patient history, and epidemiological information. The expected result is Negative. Fact Sheet for Patients:  PinkCheek.be Fact Sheet for Healthcare Providers:  GravelBags.it This test is not yet ap proved or cleared by the [place] FDA and  has been authorized for detection and/or diagnosis of SARS-CoV-2 by FDA under an Emergency Use Authorization (EUA). This EUA will remain  in effect (meaning this test can be used) for the duration of the COVID-19 declaration under Section 564(b)(1) of the Act, 21 U.S.C. section 360bbb-3(b)(1), unless the authorization is terminated or revoked sooner.    Influenza A by PCR NEGATIVE NEGATIVE Final   Influenza B by  PCR NEGATIVE NEGATIVE Final    Comment: (NOTE) The Xpert Xpress SARS-CoV-2/FLU/RSV assay is intended as an aid in  the diagnosis of influenza from Nasopharyngeal swab specimens and  should not be used as a sole basis for treatment. Nasal washings and  aspirates are unacceptable for Xpert Xpress SARS-CoV-2/FLU/RSV  testing. Fact Sheet for Patients: PinkCheek.be Fact Sheet for Healthcare Providers: GravelBags.it This test is  not yet approved or cleared by the [place] FDA and  has been authorized for detection and/or diagnosis of SARS-CoV-2 by  FDA under an Emergency Use Authorization (EUA). This EUA will remain  in effect (meaning this test can be used) for the duration of the  Covid-19 declaration under Section 564(b)(1) of the Act, 21  U.S.C. section 360bbb-3(b)(1), unless the authorization is  terminated or revoked. Performed at [facility], [place] [address], [place], [place] [address]   Urine culture     Status: None   Collection Time: 09/16/19  6:33 PM   Specimen: In/Out Cath Urine  Result Value Ref Range Status   Specimen Description   Final    IN/OUT CATH URINE Performed at [facility] [address] [place], [place] [address]    Special Requests   Final    NONE Performed at [facility], [place] [address], [place], [place] [address]    Culture   Final    NO GROWTH Performed at [facility], [place] [address], [place], [place] [address]    Report Status 09/18/2019 FINAL  Final  MRSA PCR Screening     Status: None   Collection Time: 09/17/19  4:12 AM   Specimen: Nasopharyngeal  Result Value Ref Range Status   MRSA by PCR NEGATIVE NEGATIVE Final    Comment:        The GeneXpert MRSA Assay (FDA approved for NASAL specimens only), is one component of a comprehensive MRSA colonization surveillance program. It is not intended to diagnose MRSA infection nor to guide or monitor treatment for MRSA infections. Performed at [facility], [place] [address] [place], [place] [address]   Body fluid culture (includes gram stain)     Status: None (Preliminary result)   Collection Time: 09/18/19 11:01 AM   Specimen: Pleural Fluid  Result Value Ref Range Status   Specimen Description   Final    PLEURAL Performed at [facility] [address], [place], [place] [address]    Special Requests   Final     NONE Performed at [facility], [place] [address], [place], [place] [address]    Gram Stain   Final    ABUNDANT WBC PRESENT, PREDOMINANTLY PMN NO ORGANISMS SEEN    Culture   Final    NO GROWTH 2 DAYS Performed at [facility], [place] [address], [place], [place] [address]    Report Status PENDING  Incomplete     Radiology Studies: DG Abd 1 View  Result Date: 09/20/2019 CLINICAL DATA:  68 year old female with NG placement. EXAM: ABDOMEN - 1 VIEW COMPARISON:  Earlier radiograph dated 09/20/2019. FINDINGS: The side port of the NG tube is just distal to the gastroesophageal junction and tip in the proximal stomach. No interval change in left lung base airspace density and probable bilateral pleural effusions. IMPRESSION: Nasogastric tube with tip in the proximal stomach. Electronically Signed   By: Anner Crete M.D.   On: 09/20/2019 12:46   DG Abd 1 View  Result Date: 09/20/2019 CLINICAL DATA:  Evaluate NG tube placement. EXAM: ABDOMEN - 1 VIEW COMPARISON:  09/19/2019 FINDINGS: The nasogastric tube tip is below the level of the GE junction the side port is approximately 3.9 cm below the GE junction. Cannot confirm tip position on the AP radiograph as this is excluded from the field of view. Bilateral pleural effusions and left lower lobe airspace disease noted. IMPRESSION: 1. Side port and tip of NG tube are below the level of the GE junction. Cannot confirm exact location of tip of NG tube as this is excluded from the field of view on the AP radiograph. Advise repeat AP portable radiograph of the abdomen to confirm tip position 2. Bilateral pleural effusions 3. Left lower lobe airspace disease. Electronically Signed   By: Kerby Moors M.D.   On: 09/20/2019 11:45   DG Abd 1 View  Result Date: 09/19/2019 CLINICAL DATA:  NG tube placement EXAM: ABDOMEN - 1 VIEW COMPARISON:  None. FINDINGS: Enteric tube terminates in the mid gastric body. Surgical sutures in the left  mid abdomen. Cholecystectomy clips. Suspected small left pleural effusion.  Lungs are otherwise clear. IMPRESSION: Enteric tube terminates in the mid gastric body. Electronically Signed   By: Julian Hy M.D.   On: 09/19/2019 10:43   DG Chest Port 1 View  Result Date: 09/19/2019 CLINICAL DATA:  Respiratory failure. EXAM: PORTABLE CHEST 1 VIEW COMPARISON:  Radiographs yesterday, additional priors. FINDINGS: Patient is rotated. Volume loss in the left hemithorax appears similar to prior exam. Retrocardiac and basilar opacities unchanged, probable left pleural effusion. Left perihilar opacity appears slightly nodular, and slightly progressed. Right lung is clear. No pulmonary edema. No pneumothorax. Stable osseous structures. IMPRESSION: 1. Unchanged volume loss in the left hemithorax. Retrocardiac and basilar opacities are unchanged likely combination of airspace disease/atelectasis and pleural effusion. 2. Left perihilar airspace disease is slightly progressed, currently appears slightly nodular. It is unclear if this is due to patient rotation or worsening airspace disease. This may be due to underlying post radiation change/scarring, however perihilar opacities persist, consider follow-up chest CT. Electronically Signed   By: Keith Rake M.D.   On: 09/19/2019 05:48    Scheduled Meds: . Chlorhexidine Gluconate Cloth  6 each Topical Daily  . collagenase   Topical Daily  . feeding supplement (OSMOLITE 1.5 CAL)  1,000 mL Per Tube Q24H  . feeding supplement (PRO-STAT SUGAR FREE 64)  30 mL Per Tube BID  . free water  100 mL Per Tube Q6H  . Gerhardt's butt cream   Topical BID  . heparin  5,000 Units Subcutaneous Q8H  . insulin aspart  0-9 Units Subcutaneous Q4H  . mouth rinse  15 mL Mouth Rinse BID   Continuous Infusions: . cefTRIAXone (ROCEPHIN)  IV Stopped (09/19/19 2206)     LOS: 4 days   Marylu Lund, MD [facility] Pager On Amion  If 7PM-7AM, please contact  night-coverage 09/20/2019, 6:26 PM

## 2019-09-20 NOTE — Evaluation (Signed)
Occupational Therapy Evaluation Patient Details Name: Barbara Flowers MRN: [id number] DOB: March 14, 1951 Today's Date: 09/20/2019    History of Present Illness 68 yo female admitted with weakness. Hx of CVA with L side residual weakness, dementia, Parkinsonism, Sz, Breat cancer, Brain tumor, sacral wounds, AVM   Clinical Impression   Pt with decline in function and safety with ADLs and mobility. Pt required Max assist +2 for bed mobility to sit EOB. She sat EOB with min A for static sitting balance. Pt responded mostly with "yes" or "no" head shakes. No family was present during session. Unsure of PLOF info beyond pt being wheelchair bound as pt non verbal. Pt required max A hand over hand assist to wash face and hands. Pt is total A with bathing, dressing, toileting.  OT will follow pt during acute stay for progress as tolerated   Follow Up Recommendations  SNF;Supervision/Assistance - 24 hour(depending on what family decides/has in mind)    Equipment Recommendations  None recommended by OT    Recommendations for Other Services       Precautions / Restrictions Precautions Precautions: Fall Precaution Comments: L side residual weakness, feeding tube NG Restrictions Weight Bearing Restrictions: No      Mobility Bed Mobility Overal bed mobility: Needs Assistance Bed Mobility: Supine to Sit;Sit to Supine     Supine to sit: Max assist;+2 for physical assistance;+2 for safety/equipment;HOB elevated Sit to supine: Max assist;+2 for physical assistance;+2 for safety/equipment;HOB elevated   General bed mobility comments: Assist for trunk and bil LEs. Utilized bedpad for scooting, postioning. Pt sat EOB for at least 10 minutes with brief periods of Min guard assist but mostly Min assist.  Transfers                 General transfer comment: unable    Balance Overall balance assessment: Needs assistance Sitting-balance support: Bilateral upper extremity supported Sitting  balance-Leahy Scale: Poor Sitting balance - Comments: Assist to stabilize in static sitting. Postural control: Left lateral lean                                 ADL either performed or assessed with clinical judgement   ADL Overall ADL's : Needs assistance/impaired Eating/Feeding: NPO;Total assistance Eating/Feeding Details (indicate cue type and reason): NG tube Grooming: Wash/dry hands;Wash/dry face;Maximal assistance Grooming Details (indicate cue type and reason): hand over hand assist Upper Body Bathing: Total assistance   Lower Body Bathing: Total assistance   Upper Body Dressing : Total assistance   Lower Body Dressing: Total assistance     Toilet Transfer Details (indicate cue type and reason): unable Toileting- Clothing Manipulation and Hygiene: Total assistance;Bed level               Vision Patient Visual Report: (unable to properly assess) Additional Comments: unable to properly assess     Perception     Praxis      Pertinent Vitals/Pain Pain Assessment: No/denies pain     Hand Dominance Right   Extremity/Trunk Assessment Upper Extremity Assessment Upper Extremity Assessment: Generalized weakness;LUE deficits/detail LUE Deficits / Details: hemiplegia from old CVA, increased tone in digits, edematous L hand   Lower Extremity Assessment Lower Extremity Assessment: Generalized weakness;LLE deficits/detail LLE Deficits / Details: knee flexion contracture ~20 degrees   Cervical / Trunk Assessment Cervical / Trunk Assessment: Kyphotic   Communication Communication Communication: Expressive difficulties   Cognition Arousal/Alertness: Awake/alert Behavior During Therapy: WFL for tasks  assessed/performed Overall Cognitive Status: Difficult to assess                                 General Comments: pt mostly shook head "yes" or "no". Pt with cognitive impairments at baseline; dificulty obtaining PLOF, no family/caregiver  present. Pt non verbal when asked questions   General Comments       Exercises     Shoulder Instructions      Home Living Family/patient expects to be discharged to:: Unsure Living Arrangements: Spouse/significant other Available Help at Discharge: Family                         Home Equipment: Wheelchair - manual   Additional Comments: pt with cognitive impairments at baseline; dificulty obtaining PLOF, no family/caregiver present. Pt non verbal when asked question, shakes head yes and no.      Prior Functioning/Environment Level of Independence: Needs assistance  Gait / Transfers Assistance Needed: WC bound per chart ADL's / Homemaking Assistance Needed: Assist for ADLs            OT Problem List: Decreased strength;Impaired balance (sitting and/or standing);Decreased cognition;Impaired tone;Decreased range of motion;Decreased safety awareness;Increased edema;Decreased activity tolerance;Decreased knowledge of use of DME or AE;Impaired UE functional use      OT Treatment/Interventions: Self-care/ADL training;DME and/or AE instruction;Therapeutic activities;Therapeutic exercise;Patient/family education    OT Goals(Current goals can be found in the care plan section) Acute Rehab OT Goals Patient Stated Goal: none stated OT Goal Formulation: Patient unable to participate in goal setting Time For Goal Achievement: 10/04/19 Potential to Achieve Goals: Fair ADL Goals Pt Will Perform Grooming: with mod assist;sitting;bed level Additional ADL Goal #1: Pt will complete bed mobility mod A +2 to sit EOB for functional tasks Additional ADL Goal #2: Pt will roll L and R max A for staff to assist pt with hygiene/pericare  OT Frequency: Min 2X/week   Barriers to D/C:            Co-evaluation PT/OT/SLP Co-Evaluation/Treatment: Yes Reason for Co-Treatment: Complexity of the patient's impairments (multi-system involvement);For patient/therapist safety;To address  functional/ADL transfers   OT goals addressed during session: ADL's and self-care      AM-PAC OT "6 Clicks" Daily Activity     Outcome Measure Help from another person eating meals?: Total Help from another person taking care of personal grooming?: A Lot Help from another person toileting, which includes using toliet, bedpan, or urinal?: Total Help from another person bathing (including washing, rinsing, drying)?: Total Help from another person to put on and taking off regular upper body clothing?: Total Help from another person to put on and taking off regular lower body clothing?: Total 6 Click Score: 7   End of Session    Activity Tolerance: Patient limited by fatigue Patient left: in bed;with call bell/phone within reach;with bed alarm set  OT Visit Diagnosis: Other abnormalities of gait and mobility (R26.89);Muscle weakness (generalized) (M62.81);History of falling (Z91.81);Other symptoms and signs involving cognitive function;Hemiplegia and hemiparesis;Cognitive communication deficit (R41.841) Symptoms and signs involving cognitive functions: Cerebral infarction Hemiplegia - Right/Left: Left Hemiplegia - dominant/non-dominant: Non-Dominant Hemiplegia - caused by: Cerebral infarction                [id number] OT Time Calculation (min): 21 min Charges:  OT General Charges $OT Visit: 1 Visit OT Evaluation $OT Eval Moderate Complexity: 1 Mod    Coy Rochford,  Harmon Dun 09/20/2019, 2:52 PM

## 2019-09-21 LAB — CYTOLOGY - NON PAP

## 2019-09-21 LAB — COMPREHENSIVE METABOLIC PANEL
ALT: 38 U/L (ref 0–44)
AST: 50 U/L — ABNORMAL HIGH (ref 15–41)
Albumin: 2.5 g/dL — ABNORMAL LOW (ref 3.5–5.0)
Alkaline Phosphatase: 137 U/L — ABNORMAL HIGH (ref 38–126)
Anion gap: 11 (ref 5–15)
BUN: 30 mg/dL — ABNORMAL HIGH (ref 8–23)
CO2: 24 mmol/L (ref 22–32)
Calcium: 8 mg/dL — ABNORMAL LOW (ref 8.9–10.3)
Chloride: 108 mmol/L (ref 98–111)
Creatinine, Ser: 0.5 mg/dL (ref 0.44–1.00)
GFR calc Af Amer: >60 mL/min (ref >60)
GFR calc non Af Amer: >60 mL/min (ref >60)
Glucose, Bld: 147 mg/dL — ABNORMAL HIGH (ref 70–99)
Potassium: 3.9 mmol/L (ref 3.5–5.1)
Sodium: 143 mmol/L (ref 135–145)
Total Bilirubin: 0.7 mg/dL (ref 0.3–1.2)
Total Protein: 5.1 g/dL — ABNORMAL LOW (ref 6.5–8.1)

## 2019-09-21 LAB — CBC
HCT: 34 % — ABNORMAL LOW (ref 36.0–46.0)
Hemoglobin: 10.3 g/dL — ABNORMAL LOW (ref 12.0–15.0)
MCH: 29.7 pg (ref 26.0–34.0)
MCHC: 30.3 g/dL (ref 30.0–36.0)
MCV: 98 fL (ref 80.0–100.0)
Platelets: 161 10*3/uL (ref 150–400)
RBC: 3.47 MIL/uL — ABNORMAL LOW (ref 3.87–5.11)
RDW: 13.8 % (ref 11.5–15.5)
WBC: 18.7 10*3/uL — ABNORMAL HIGH (ref 4.0–10.5)
nRBC: 0.1 % (ref 0.0–0.2)

## 2019-09-21 LAB — GLUCOSE, CAPILLARY
Glucose-Capillary: 130 mg/dL — ABNORMAL HIGH (ref 70–99)
Glucose-Capillary: 119 mg/dL — ABNORMAL HIGH (ref 70–99)
Glucose-Capillary: 135 mg/dL — ABNORMAL HIGH (ref 70–99)
Glucose-Capillary: 109 mg/dL — ABNORMAL HIGH (ref 70–99)
Glucose-Capillary: 115 mg/dL — ABNORMAL HIGH (ref 70–99)
Glucose-Capillary: 116 mg/dL — ABNORMAL HIGH (ref 70–99)

## 2019-09-21 LAB — CULTURE, BLOOD (ROUTINE X 2): Culture: NO GROWTH

## 2019-09-21 LAB — MAGNESIUM: Magnesium: 1.7 mg/dL (ref 1.7–2.4)

## 2019-09-21 MED ORDER — LACTATED RINGERS IV SOLN: INTRAVENOUS | Status: DC

## 2019-09-21 MED ORDER — ALBUMIN HUMAN 25 % IV SOLN
25.0000 g | Freq: Once | INTRAVENOUS | Status: AC
  Administered 2019-09-21: 23:00:00 25 g via INTRAVENOUS
  Filled 2019-09-21: qty 100

## 2019-09-21 MED ORDER — ALBUMIN HUMAN 25 % IV SOLN
25.0000 g | Freq: Once | INTRAVENOUS | Status: AC
  Administered 2019-09-21: 11:00:00 25 g via INTRAVENOUS
  Filled 2019-09-21: qty 100

## 2019-09-21 MED ORDER — AMIODARONE IV BOLUS ONLY 150 MG/100ML
INTRAVENOUS | Status: AC
  Filled 2019-09-21: qty 100

## 2019-09-21 MED ORDER — ALBUMIN HUMAN 25 % IV SOLN
INTRAVENOUS | Status: AC
  Filled 2019-09-21: qty 50

## 2019-09-21 MED ORDER — SODIUM CHLORIDE 0.9 % IV BOLUS
1000.0000 mL | Freq: Once | INTRAVENOUS | Status: AC
  Administered 2019-09-21: 22:00:00 1000 mL via INTRAVENOUS

## 2019-09-21 MED ORDER — AMIODARONE IV BOLUS ONLY 150 MG/100ML
150.0000 mg | Freq: Once | INTRAVENOUS | Status: AC
  Administered 2019-09-21: 150 mg via INTRAVENOUS

## 2019-09-21 NOTE — Progress Notes (Signed)
Removed pt ring from her left hand due to swelling. Ring was placed in a biohazard bag w pt label and put in the patient's belongings. Husband was called and informed of. Ring is gold w a silver diamond.

## 2019-09-21 NOTE — Evaluation (Signed)
Clinical/Bedside Swallow Evaluation Patient Details  Name: Kortnei Basden MRN: [id number] Date of Birth: 03-18-1951  Today's Date: 09/21/2019 Time: SLP Start Time (ACUTE ONLY): 1115 SLP Stop Time (ACUTE ONLY): 1129 SLP Time Calculation (min) (ACUTE ONLY): 14 min  Past Medical History:  Past Medical History:  Diagnosis Date  . Anemia   . Anemia   . Anxiety disorder   . Blood transfusion without reported diagnosis   . Brain cancer [facility])    s/p sterotactic radio surgery  . Brain tumor ([place])   . Breast cancer ([place])   . Cancer ([place]) 1990   L breast  . H/O bone marrow transplant ([place])   . Hypothyroidism   . IBS (irritable bowel syndrome)   . MDD (major depressive disorder)   . Memory loss   . Obesity   . Orthostatic hypotension   . Seizure ([place])   . Seizures ([place])   . Stroke [facility]) 2005   hemorrhagic  . Stroke ([facility])   . Subdural hematoma ([place])   . Upper brachial plexus paralysis syndrome   . Urinary incontinence    Past Surgical History:  Past Surgical History:  Procedure Laterality Date  . BREAST LUMPECTOMY Left    post chemotherapy and radiation  . CHOLECYSTECTOMY    . CRANIOTOMY FOR HEMISPHERECTOMY TOTAL / PARTIAL Right   . GASTRIC BYPASS  1985  . HAND SURGERY Left 2015  . HAND TENDON SURGERY Left    for brachial plexus injury  . LYMPH NODE BIOPSY     HPI:  Ms. Shelvia Nguon, 68y/f, presented to ER after 1 week of cough/congestion, poor intake, AMS and weakness. PMH of Breast CA (early 1990's), sundowning/dementia w/c bound x 3 years post CVA, anemia, anxiety disorder, Brain tumor/cancer, hypothyroidism, IBS< Memory loss, hypotension, seizures, upper brachial plexus paralysis syndrome. Prior swallow evaluations 2016 and 2018 both finding normal oropharyngeal swallow function.    Assessment / Plan / Recommendation Clinical Impression   Clinical swallow evaluation completed at bedside. Patient is non-verbal and able to follow some commands with a delayed response.  Oral care provided (pt's mouth was very dry with dry cracked lips). Pt unable to produce a cough when elicited, but after several ice chips trials, pt was able to produce a cough effective enough to cough pharyngeal secretions to base of tongue for oral suctioning. Secretions were very thick in nature. Pt had poor oral manipulation of ice chips, but did initiate a swallow. Ice chips appeared to offer her some comfort and also aided in loosing secretions for clearance. No further PO trials given due to increased risk of aspiration. Spoke with RN and recommended NPO status with pt allowed 5 ice chips given by nursing only after good oral care. Pt has an NG present for nutrition. ST will continue to follow for PO readiness and likely an instrumental evaluation.  SLP Visit Diagnosis: Dysphagia, oral phase (R13.11)    Aspiration Risk  Severe aspiration risk    Diet Recommendation NPO;Ice chips PRN after oral care   Medication Administration: Via alternative means    Other  Recommendations Oral Care Recommendations: Oral care QID   Follow up Recommendations        Frequency and Duration min 2x/week  2 weeks       Prognosis Prognosis for Safe Diet Advancement: Fair Barriers to Reach Goals: Cognitive deficits;Severity of deficits      Swallow Study   General Date of Onset: 09/16/19 HPI: Ms. Tonianne Fryrear, 68y/f, presented to ER after 1 week  of cough/congestion, poor intake, AMS and weakness. PMH of Breast CA (early 1990's), sundowning/dementia w/c bound x 3 years post CVA, anemia, anxiety disorder, Brain tumor/cancer, hypothyroidism, IBS< Memory loss, hypotension, seizures, upper brachial plexus paralysis syndrome. Prior swallow evaluations 2016 and 2018 both finding normal oropharyngeal swallow function.  Type of Study: Bedside Swallow Evaluation Previous Swallow Assessment: 2016, 2018 Diet Prior to this Study: NPO Temperature Spikes Noted: No Respiratory Status: Room air History of Recent  Intubation: No Behavior/Cognition: Alert;Requires cueing;Doesn't follow directions Oral Cavity Assessment: Dry;Dried secretions Oral Care Completed by SLP: Yes Oral Cavity - Dentition: Poor condition;Missing dentition Self-Feeding Abilities: Total assist Patient Positioning: Upright in bed;Postural control interferes with function Baseline Vocal Quality: (non-verbal) Volitional Cough: Congested Volitional Swallow: Unable to elicit    Oral/Motor/Sensory Function Overall Oral Motor/Sensory Function: Generalized oral weakness Facial Symmetry: Abnormal symmetry left   Ice Chips Ice chips: Impaired Presentation: Spoon Oral Phase Impairments: Poor awareness of bolus;Reduced labial seal(improved with more trials) Oral Phase Functional Implications: Prolonged oral transit;Left anterior spillage Pharyngeal Phase Impairments: Suspected delayed Swallow   Thin Liquid Thin Liquid: Not tested    Nectar Thick Nectar Thick Liquid: Not tested   Honey Thick Honey Thick Liquid: Not tested   Puree Puree: Not tested   Solid     Solid: Not tested      Wynelle Bourgeois., MA, CCC-SLP 09/21/2019,1:47 PM

## 2019-09-21 NOTE — Progress Notes (Addendum)
PROGRESS NOTE    Barbara Flowers  [id number] DOB: 1951/06/20 DOA: 09/16/2019 PCP: [name], NP    Brief [id number] with hx breast cancer, dementia, hx prior CVA presented on 12/26 with extensive L sided infiltrate and effusion with hypoxemia, requiring nonrebreather. Pt was initially admitted to [facility]:   Active Problems:   Dementia ([place])   CAP (community acquired pneumonia)   Pleural effusion on left   Acute prerenal azotemia   Hypernatremia   Pressure injury of skin   1. Acute hypoxemic respiratory failure 1. Presented initially NRB dependent, now weaned to room air 2. Pt s/p thoracentesis on 12/28 yielding 200cc fluid 3. Had earlier discussed with PCCM. Recommendation for cont abx, currently on rocephin 4. Patient remains with tachypnea to the upper 20's. Will check CT chest 2. Tachycardia 1. Intermittent but new findings noted as of 12/20 2. Correct electrolytes 3. Pt is continued on PRN labetalol with hold parameters 4. Pt hypotensive with dry membranes, decreased urine output. Suspect intravascular depletion given albumin of 2.5. 5. Will give 25g of albumin with IVF hydration 3. Pneumococcal PNA with pleural effusion 1. Per above, pt is continued on rocephin and is s/p thoracentesis yielding 200cc fluid 2. On minimal O2 support, however remains with tachypnea to the mid-20 to 30's 3. Continuing on rocephin as tolerated 4. Proteus bacteremia 1. Blood cx from 12/26 with pan-sensitive proteus species 2. Pt is continued on rocephin as tolerated 3. repeat CBC in AM 5. Acute toxic metabolic encephalopathy 1. Seems to be stable this AM 2. Will continue abx and supportive care 6. Hypernatremia 1. Resolved as of 12/29 2. Repeat bmet in AM 7. Hypokalemia 1. Normalized as of 12/29 2. Recheck bmet in AM 8. Metabolic acidosis 1. Resolved as of 12/29 and will repeat bmet in AM 9. Unstageable sacral decub ulcer POA 1. Seen by  [facility] 10. Moderate protein calorie malnutrition 1. Nutrition following. Continued on tube feeding  DVT prophylaxis: heparin subq Code Status: Full Family Communication: Pt in room, family not at bedside Disposition Plan: Uncertain at this time  Consultants:   Critical Care  Procedures:   Bedside thoracentesis 12/28  Antimicrobials: Anti-infectives (From admission, onward)   Start     Dose/Rate Route Frequency Ordered Stop   09/17/19 1000  azithromycin (ZITHROMAX) 500 mg in sodium chloride 0.9 % 250 mL IVPB  Status:  Discontinued     500 mg 250 mL/hr over 60 Minutes Intravenous Every 24 hours 09/17/19 0920 09/18/19 1157   09/16/19 2200  cefTRIAXone (ROCEPHIN) 2 g in sodium chloride 0.9 % 100 mL IVPB     2 g 200 mL/hr over 30 Minutes Intravenous Every 24 hours 09/16/19 1745     09/16/19 1745  cefTRIAXone (ROCEPHIN) injection 1 g  Status:  Discontinued     1 g Intramuscular Every 24 hours 09/16/19 1740 09/16/19 1743   09/16/19 1515  ceFEPIme (MAXIPIME) 2 g in sodium chloride 0.9 % 100 mL IVPB     2 g 200 mL/hr over 30 Minutes Intravenous  Once 09/16/19 1513 09/16/19 1834   09/16/19 1515  metroNIDAZOLE (FLAGYL) IVPB 500 mg  Status:  Discontinued     500 mg 100 mL/hr over 60 Minutes Intravenous  Once 09/16/19 1513 09/16/19 1741   09/16/19 1515  vancomycin (VANCOCIN) IVPB 1000 mg/200 mL premix  Status:  Discontinued     1,000 mg 200 mL/hr over 60 Minutes Intravenous  Once 09/16/19 1513 09/16/19 1834  Subjective: Without complaints noted this AM  Objective: Vitals:   09/21/19 1200 09/21/19 1300 09/21/19 1400 09/21/19 1500  BP: (!) 85/45  (!) 107/59   Pulse: 91 88 90 94  Resp: (!) 27 (!) 24 (!) 26 (!) 24  Temp: 98.6 F (37 C)     TempSrc: Axillary     SpO2: 95% 96% 96% 95%  Weight:      Height:        Intake/Output Summary (Last 24 hours) at 09/21/2019 1627 Last data filed at 09/21/2019 1300 Gross per 24 hour  Intake 1608.41 ml  Output 350 ml  Net 1258.41 ml    Filed Weights   09/19/19 0345 09/20/19 0448 09/21/19 0353  Weight: 59.3 kg 58 kg 60.6 kg    Examination: General exam: Awake, laying in bed, in nad Respiratory system: Increased resp effort, no wheezing Cardiovascular system: regular rate, s1, s2 Gastrointestinal system: Soft, nondistended, positive BS Central nervous system: CN2-12 grossly intact, strength intact Extremities: Perfused, no clubbing Skin: Normal skin turgor, no notable skin lesions seen Psychiatry: difficult to assess given mentation  Data Reviewed: I have personally reviewed following labs and imaging studies  CBC: Recent Labs  Lab 09/16/19 1600 09/17/19 0941 09/19/19 0235 09/21/19 0234  WBC 10.5 13.7* 18.2* 18.7*  NEUTROABS 7.5 11.7*  --   --   HGB 12.8 12.7 10.3* 10.3*  HCT 41.2 41.4 33.1* 34.0*  MCV 98.1 98.1 95.9 98.0  PLT 266 268 182 [id number]   Basic Metabolic Panel: Recent Labs  Lab 09/16/19 1600 09/17/19 0642 09/19/19 0235 09/19/19 1318 09/19/19 1800 09/20/19 0906 09/20/19 1648 09/21/19 0234  NA 147* 144 144  --   --   --   --  143  K 4.1 3.4* 4.0  --   --   --   --  3.9  CL 106 103 107  --   --   --   --  108  CO2 30 22 24   --   --   --   --  24  GLUCOSE 125* 258* 93  --   --   --   --  147*  BUN 57* 41* 24*  --   --   --   --  30*  CREATININE 1.14* 0.71 0.57  --   --   --   --  0.50  CALCIUM 9.2 8.0* 8.5*  --   --   --   --  8.0*  MG  --   --   --  1.8 1.8 1.8 1.7 1.7  PHOS  --   --   --  2.0* 2.0* 1.6* 1.7*  --    GFR: Estimated Creatinine Clearance: 63.5 mL/min (by C-G formula based on SCr of 0.5 mg/dL). Liver Function Tests: Recent Labs  Lab 09/16/19 1600 09/19/19 0235 09/21/19 0234  AST 27 80* 50*  ALT 27 37 38  ALKPHOS 95 241* 137*  BILITOT 1.2 1.0 0.7  PROT 7.8 5.5* 5.1*  ALBUMIN 3.1* 3.1* 2.5*   No results for input(s): LIPASE, AMYLASE in the last 168 hours. No results for input(s): AMMONIA in the last 168 hours. Coagulation Profile: Recent Labs  Lab  09/16/19 1600  INR 1.1   Cardiac Enzymes: No results for input(s): CKTOTAL, CKMB, CKMBINDEX, TROPONINI in the last 168 hours. BNP (last 3 results) No results for input(s): PROBNP in the last 8760 hours. HbA1C: No results for input(s): HGBA1C in the last 72 hours. CBG: Recent Labs  Lab 09/20/19  1908 09/20/19 2306 09/21/19 0344 09/21/19 0844 09/21/19 1238  GLUCAP 150* 120* 130* 119* 135*   Lipid Profile: No results for input(s): CHOL, HDL, LDLCALC, TRIG, CHOLHDL, LDLDIRECT in the last 72 hours. Thyroid Function Tests: No results for input(s): TSH, T4TOTAL, FREET4, T3FREE, THYROIDAB in the last 72 hours. Anemia Panel: No results for input(s): VITAMINB12, FOLATE, FERRITIN, TIBC, IRON, RETICCTPCT in the last 72 hours. Sepsis Labs: Recent Labs  Lab 09/16/19 1600 09/16/19 1830 09/18/19 0259 09/19/19 0509  PROCALCITON 2.01  --  0.60 0.40  LATICACIDVEN 1.6 1.4  --   --     Recent Results (from the past 240 hour(s))  Blood Culture (routine x 2)     Status: None   Collection Time: 09/16/19  4:00 PM   Specimen: BLOOD  Result Value Ref Range Status   Specimen Description   Final    BLOOD RIGHT ANTECUBITAL Performed at [facility] [address] [place], [place] [address]    Special Requests   Final    BOTTLES DRAWN AEROBIC AND ANAEROBIC Blood Culture results may not be optimal due to an inadequate volume of blood received in culture bottles Performed at [facility] [address] [place], [place] [address]    Culture   Final    NO GROWTH 5 DAYS Performed at [facility], [place] [address], [place], [place] [address]    Report Status 09/21/2019 FINAL  Final  Blood Culture (routine x 2)     Status: Abnormal   Collection Time: 09/16/19  4:01 PM   Specimen: BLOOD RIGHT FOREARM  Result Value Ref Range Status   Specimen Description   Final    BLOOD RIGHT FOREARM Performed at [facility] [address]  [place], [place] [address]    Special Requests   Final    BOTTLES DRAWN AEROBIC AND ANAEROBIC Blood Culture adequate volume Performed at [facility] [address], [place], [place] [address]    Culture  Setup Time   Final    Lonell Grandchild NEGATIVE RODS GRAM POSITIVE COCCI AEROBIC BOTTLE ONLY CRITICAL RESULT CALLED TO, READ BACK BY AND VERIFIED WITH: PHARMD D WOFFARD [id number] AT 1440 BY CM    Culture (A)  Final    PROTEUS MIRABILIS STAPHYLOCOCCUS SPECIES (COAGULASE NEGATIVE) ORGANISM 2 THE SIGNIFICANCE OF ISOLATING THIS ORGANISM FROM A SINGLE SET OF BLOOD CULTURES WHEN MULTIPLE SETS ARE DRAWN IS UNCERTAIN. PLEASE NOTIFY THE MICROBIOLOGY DEPARTMENT WITHIN ONE WEEK IF SPECIATION AND SENSITIVITIES ARE REQUIRED. Performed at [facility], [place] [address] [place], [place] [address]    Report Status 09/20/2019 FINAL  Final   Organism ID, Bacteria PROTEUS MIRABILIS  Final      Susceptibility   Proteus mirabilis - MIC*    AMPICILLIN <=2 SENSITIVE Sensitive     CEFAZOLIN <=4 SENSITIVE Sensitive     CEFEPIME <=1 SENSITIVE Sensitive     CEFTAZIDIME <=1 SENSITIVE Sensitive     CEFTRIAXONE <=1 SENSITIVE Sensitive     CIPROFLOXACIN <=0.25 SENSITIVE Sensitive     GENTAMICIN <=1 SENSITIVE Sensitive     IMIPENEM 1 SENSITIVE Sensitive     TRIMETH/SULFA <=20 SENSITIVE Sensitive     AMPICILLIN/SULBACTAM <=2 SENSITIVE Sensitive     PIP/TAZO <=4 SENSITIVE Sensitive     * PROTEUS MIRABILIS  Blood Culture ID Panel (Reflexed)     Status: Abnormal   Collection Time: 09/16/19  4:01 PM  Result Value Ref Range Status   Enterococcus  species NOT DETECTED NOT DETECTED Final   Listeria monocytogenes NOT DETECTED NOT DETECTED Final   Staphylococcus species DETECTED (A) NOT DETECTED Final    Comment: Methicillin (oxacillin) resistant coagulase negative staphylococcus. Possible blood culture contaminant (unless isolated from more than one blood culture draw or clinical case suggests pathogenicity). No  antibiotic treatment is indicated for blood  culture contaminants. CRITICAL RESULT CALLED TO, READ BACK BY AND VERIFIED WITH: PHARMD D WAFFORD [id number] AT 1440 BY CM    Staphylococcus aureus (BCID) NOT DETECTED NOT DETECTED Final   Methicillin resistance DETECTED (A) NOT DETECTED Final    Comment: CRITICAL RESULT CALLED TO, READ BACK BY AND VERIFIED WITH: PHARMD D WOFFORD [id number] AT 1440 BY CM    Streptococcus species NOT DETECTED NOT DETECTED Final   Streptococcus agalactiae NOT DETECTED NOT DETECTED Final   Streptococcus pneumoniae NOT DETECTED NOT DETECTED Final   Streptococcus pyogenes NOT DETECTED NOT DETECTED Final   Acinetobacter baumannii NOT DETECTED NOT DETECTED Final   Enterobacteriaceae species DETECTED (A) NOT DETECTED Final    Comment: Enterobacteriaceae represent a large family of gram-negative bacteria, not a single organism. CRITICAL RESULT CALLED TO, READ BACK BY AND VERIFIED WITH: PHARMD D WAFFORD [id number] AT 1440 BY CM    Enterobacter cloacae complex NOT DETECTED NOT DETECTED Final   Escherichia coli NOT DETECTED NOT DETECTED Final   Klebsiella oxytoca NOT DETECTED NOT DETECTED Final   Klebsiella pneumoniae NOT DETECTED NOT DETECTED Final   Proteus species DETECTED (A) NOT DETECTED Final    Comment: CRITICAL RESULT CALLED TO, READ BACK BY AND VERIFIED WITH: PHARMD D WAFFORD [id number] AT 1440 BY CM    Serratia marcescens NOT DETECTED NOT DETECTED Final   Carbapenem resistance NOT DETECTED NOT DETECTED Final   Haemophilus influenzae NOT DETECTED NOT DETECTED Final   Neisseria meningitidis NOT DETECTED NOT DETECTED Final   Pseudomonas aeruginosa NOT DETECTED NOT DETECTED Final   Candida albicans NOT DETECTED NOT DETECTED Final   Candida glabrata NOT DETECTED NOT DETECTED Final   Candida krusei NOT DETECTED NOT DETECTED Final   Candida parapsilosis NOT DETECTED NOT DETECTED Final   Candida tropicalis NOT DETECTED NOT DETECTED Final    Comment: Performed at [facility], [place] [address], [place], [place] [address]  Respiratory Panel by RT PCR (Flu A&B, Covid) - Nasopharyngeal Swab     Status: None   Collection Time: 09/16/19  4:42 PM   Specimen: Nasopharyngeal Swab  Result Value Ref Range Status   SARS Coronavirus 2 by RT PCR NEGATIVE NEGATIVE Final    Comment: (NOTE) SARS-CoV-2 target nucleic acids are NOT DETECTED. The SARS-CoV-2 RNA is generally detectable in upper respiratoy specimens during the acute phase of infection. The lowest concentration of SARS-CoV-2 viral copies this assay can detect is 131 copies/mL. A negative result does not preclude SARS-Cov-2 infection and should not be used as the sole basis for treatment or other patient management decisions. A negative result may occur with  improper specimen collection/handling, submission of specimen other than nasopharyngeal swab, presence of viral mutation(s) within the areas targeted by this assay, and inadequate number of viral copies (<131 copies/mL). A negative result must be combined with clinical observations, patient history, and epidemiological information. The expected result is Negative. Fact Sheet for Patients:  PinkCheek.be Fact Sheet for Healthcare Providers:  GravelBags.it This test is not yet ap proved or cleared by the [place] FDA and  has been authorized for detection and/or diagnosis of SARS-CoV-2 by FDA under  an Emergency Use Authorization (EUA). This EUA will remain  in effect (meaning this test can be used) for the duration of the COVID-19 declaration under Section 564(b)(1) of the Act, 21 U.S.C. section 360bbb-3(b)(1), unless the authorization is terminated or revoked sooner.    Influenza A by PCR NEGATIVE NEGATIVE Final   Influenza B by PCR NEGATIVE NEGATIVE Final    Comment: (NOTE) The Xpert Xpress SARS-CoV-2/FLU/RSV assay is intended as an aid in  the diagnosis of influenza from Nasopharyngeal  swab specimens and  should not be used as a sole basis for treatment. Nasal washings and  aspirates are unacceptable for Xpert Xpress SARS-CoV-2/FLU/RSV  testing. Fact Sheet for Patients: PinkCheek.be Fact Sheet for Healthcare Providers: GravelBags.it This test is not yet approved or cleared by the [place] FDA and  has been authorized for detection and/or diagnosis of SARS-CoV-2 by  FDA under an Emergency Use Authorization (EUA). This EUA will remain  in effect (meaning this test can be used) for the duration of the  Covid-19 declaration under Section 564(b)(1) of the Act, 21  U.S.C. section 360bbb-3(b)(1), unless the authorization is  terminated or revoked. Performed at [facility], [place] [address] [place], [place] [address]   Urine culture     Status: None   Collection Time: 09/16/19  6:33 PM   Specimen: In/Out Cath Urine  Result Value Ref Range Status   Specimen Description   Final    IN/OUT CATH URINE Performed at [facility] [address] [place], [place] [address]    Special Requests   Final    NONE Performed at [facility], [place] [address], [place], [place] [address]    Culture   Final    NO GROWTH Performed at [facility], [place] [address], [place], [place] [address]    Report Status 09/18/2019 FINAL  Final  MRSA PCR Screening     Status: None   Collection Time: 09/17/19  4:12 AM   Specimen: Nasopharyngeal  Result Value Ref Range Status   MRSA by PCR NEGATIVE NEGATIVE Final    Comment:        The GeneXpert MRSA Assay (FDA approved for NASAL specimens only), is one component of a comprehensive MRSA colonization surveillance program. It is not intended to diagnose MRSA infection nor to guide or monitor treatment for MRSA infections. Performed at [facility], [place] [address], [place], [place] [address]   Body  fluid culture (includes gram stain)     Status: None (Preliminary result)   Collection Time: 09/18/19 11:01 AM   Specimen: Pleural Fluid  Result Value Ref Range Status   Specimen Description   Final    PLEURAL Performed at [facility] [address] [place] [address], [place], [place] [address]    Special Requests   Final    NONE Performed at [facility], [place] [address] [place], [place] [address]    Gram Stain   Final    ABUNDANT WBC PRESENT, PREDOMINANTLY PMN NO ORGANISMS SEEN    Culture   Final    NO GROWTH 3 DAYS Performed at [facility], [place] [address] [place],  [address]    Report Status PENDING  Incomplete     Radiology Studies: DG Abd 1 View  Result Date: 09/20/2019 CLINICAL DATA:  68 year old female with NG placement. EXAM: ABDOMEN - 1 VIEW COMPARISON:  Earlier radiograph dated 09/20/2019. FINDINGS: The side port of the NG  tube is just distal to the gastroesophageal junction and tip in the proximal stomach. No interval change in left lung base airspace density and probable bilateral pleural effusions. IMPRESSION: Nasogastric tube with tip in the proximal stomach. Electronically Signed   By: Anner Crete M.D.   On: 09/20/2019 12:46   DG Abd 1 View  Result Date: 09/20/2019 CLINICAL DATA:  Evaluate NG tube placement. EXAM: ABDOMEN - 1 VIEW COMPARISON:  09/19/2019 FINDINGS: The nasogastric tube tip is below the level of the GE junction the side port is approximately 3.9 cm below the GE junction. Cannot confirm tip position on the AP radiograph as this is excluded from the field of view. Bilateral pleural effusions and left lower lobe airspace disease noted. IMPRESSION: 1. Side port and tip of NG tube are below the level of the GE junction. Cannot confirm exact location of tip of NG tube as this is excluded from the field of view on the AP radiograph. Advise repeat AP portable radiograph of the abdomen to confirm tip position 2. Bilateral  pleural effusions 3. Left lower lobe airspace disease. Electronically Signed   By: Kerby Moors M.D.   On: 09/20/2019 11:45    Scheduled Meds: . Chlorhexidine Gluconate Cloth  6 each Topical Daily  . collagenase   Topical Daily  . feeding supplement (OSMOLITE 1.5 CAL)  1,000 mL Per Tube Q24H  . feeding supplement (PRO-STAT SUGAR FREE 64)  30 mL Per Tube BID  . free water  100 mL Per Tube Q6H  . Gerhardt's butt cream   Topical BID  . heparin  5,000 Units Subcutaneous Q8H  . insulin aspart  0-9 Units Subcutaneous Q4H  . mouth rinse  15 mL Mouth Rinse BID   Continuous Infusions: . cefTRIAXone (ROCEPHIN)  IV Stopped (09/20/19 2208)  . lactated ringers 75 mL/hr at 09/21/19 1038     LOS: 5 days   Marylu Lund, MD [facility] Pager On Amion  If 7PM-7AM, please contact night-coverage 09/21/2019, 4:27 PM

## 2019-09-21 NOTE — Progress Notes (Signed)
[facility] paged for SVT for <60 seconds. HR got up to 148. Pt asymptomatic. HR back into the 90s. Will continue monitor

## 2019-09-22 ENCOUNTER — Inpatient Hospital Stay (HOSPITAL_COMMUNITY): Payer: Medicare Other

## 2019-09-22 LAB — CBC
HCT: 33.9 % — ABNORMAL LOW (ref 36.0–46.0)
Hemoglobin: 10.3 g/dL — ABNORMAL LOW (ref 12.0–15.0)
MCH: 30.5 pg (ref 26.0–34.0)
MCHC: 30.4 g/dL (ref 30.0–36.0)
MCV: 100.3 fL — ABNORMAL HIGH (ref 80.0–100.0)
Platelets: 141 10*3/uL — ABNORMAL LOW (ref 150–400)
RBC: 3.38 MIL/uL — ABNORMAL LOW (ref 3.87–5.11)
RDW: 14 % (ref 11.5–15.5)
WBC: 16.5 10*3/uL — ABNORMAL HIGH (ref 4.0–10.5)
nRBC: 0.4 % — ABNORMAL HIGH (ref 0.0–0.2)

## 2019-09-22 LAB — BODY FLUID CULTURE: Culture: NO GROWTH

## 2019-09-22 LAB — COMPREHENSIVE METABOLIC PANEL
ALT: 33 U/L (ref 0–44)
AST: 41 U/L (ref 15–41)
Albumin: 3.4 g/dL — ABNORMAL LOW (ref 3.5–5.0)
Alkaline Phosphatase: 102 U/L (ref 38–126)
Anion gap: 10 (ref 5–15)
BUN: 27 mg/dL — ABNORMAL HIGH (ref 8–23)
CO2: 26 mmol/L (ref 22–32)
Calcium: 8.4 mg/dL — ABNORMAL LOW (ref 8.9–10.3)
Chloride: 108 mmol/L (ref 98–111)
Creatinine, Ser: 0.47 mg/dL (ref 0.44–1.00)
GFR calc Af Amer: >60 mL/min (ref >60)
GFR calc non Af Amer: >60 mL/min (ref >60)
Glucose, Bld: 133 mg/dL — ABNORMAL HIGH (ref 70–99)
Potassium: 4.1 mmol/L (ref 3.5–5.1)
Sodium: 144 mmol/L (ref 135–145)
Total Bilirubin: 0.8 mg/dL (ref 0.3–1.2)
Total Protein: 5.9 g/dL — ABNORMAL LOW (ref 6.5–8.1)

## 2019-09-22 LAB — GLUCOSE, CAPILLARY
Glucose-Capillary: 105 mg/dL — ABNORMAL HIGH (ref 70–99)
Glucose-Capillary: 109 mg/dL — ABNORMAL HIGH (ref 70–99)
Glucose-Capillary: 114 mg/dL — ABNORMAL HIGH (ref 70–99)
Glucose-Capillary: 127 mg/dL — ABNORMAL HIGH (ref 70–99)
Glucose-Capillary: 142 mg/dL — ABNORMAL HIGH (ref 70–99)
Glucose-Capillary: 146 mg/dL — ABNORMAL HIGH (ref 70–99)

## 2019-09-22 MED ORDER — SODIUM CHLORIDE 0.9 % IV BOLUS
1000.0000 mL | Freq: Once | INTRAVENOUS | Status: AC
  Administered 2019-09-22: 1000 mL via INTRAVENOUS

## 2019-09-22 MED ORDER — AMIODARONE IV BOLUS ONLY 150 MG/100ML
150.0000 mg | Freq: Once | INTRAVENOUS | Status: AC
  Administered 2019-09-22: 150 mg via INTRAVENOUS
  Filled 2019-09-22: qty 100

## 2019-09-22 MED ORDER — ALBUMIN HUMAN 25 % IV SOLN
25.0000 g | Freq: Once | INTRAVENOUS | Status: AC
  Administered 2019-09-22: 25 g via INTRAVENOUS
  Filled 2019-09-22: qty 100

## 2019-09-22 MED ORDER — SODIUM CHLORIDE (PF) 0.9 % IJ SOLN
INTRAMUSCULAR | Status: AC
  Filled 2019-09-22: qty 50

## 2019-09-22 MED ORDER — LACTATED RINGERS IV SOLN: INTRAVENOUS | Status: AC

## 2019-09-22 MED ORDER — IOHEXOL 300 MG/ML  SOLN
75.0000 mL | Freq: Once | INTRAMUSCULAR | Status: AC | PRN
  Administered 2019-09-22: 75 mL via INTRAVENOUS

## 2019-09-22 NOTE — Progress Notes (Signed)
bipap not indicated at this time.  Will continue to monitor for changes.

## 2019-09-22 NOTE — Progress Notes (Signed)
Tried calling CT for an update about when we could get the patient down for her CT chest. Nobody in CT department was answering. Will try again later.

## 2019-09-22 NOTE — Progress Notes (Signed)
Tube feeding placed on hold 20 minutes prior to CPT.  Post CPT nasally suctioned for copious amounts of thick green secretions.  Did have episode of HR 150s, unsustained post suctioning.  RN aware.

## 2019-09-22 NOTE — Progress Notes (Signed)
Physical Therapy Treatment Patient Details Name: Barbara Flowers MRN: [id number] DOB: [birth year] Today's Date: 09/22/2019    History of Present Illness 69 yo female admitted with weakness. Hx of CVA with L side residual weakness, dementia, Parkinsonism, Sz, Breat cancer, Brain tumor, sacral wounds, AVM    PT Comments    Pt attempting to speak however difficult to understand and very soft spoken.  Pt grimacing and reporting pain with attempt to assist with sitting EOB so did not perform and repositioned pt to comfort.  Pt also with low BP (appears to run low?) 83/55 mmHg and remains on 4L O2 [place], SpO2 98%.  Per ED notes, "Additional history obtained from patient's husband, Tim.  He states patient is normally much more active, able to sit up, bathe herself, feed herself and have a normal conversation.  She has weakness of her left arm and is wheelchair-bound, does most of her ADLs with her right arm."  Pt does not appear at baseline and requiring more assist.  Recommend palliative care consult.    Follow Up Recommendations  SNF;Home health PT;Supervision/Assistance - 24 hour(if family can assist otherwise SNF)     Equipment Recommendations  None recommended by PT    Recommendations for Other Services       Precautions / Restrictions Precautions Precautions: Fall Precaution Comments: L side residual weakness/ contractures, feeding tube NG    Mobility  Bed Mobility Overal bed mobility: Needs Assistance             General bed mobility comments: attempt to assist with sitting upright would have been too painful for pt upon attempting to move extremities for positioning, so pt repositioned to comfort and elevated R UE (edematous)  Transfers                    Ambulation/Gait                 Stairs             Wheelchair Mobility    Modified Rankin (Stroke Patients Only)       Balance                                             Cognition Arousal/Alertness: Awake/alert Behavior During Therapy: WFL for tasks assessed/performed Overall Cognitive Status: Difficult to assess                                 General Comments: pt not shaking head today for responses.  Pt attempting to speak however difficult to understand and exremely soft spoken, pt does report pain (RN notified)      Exercises      General Comments        Pertinent Vitals/Pain Pain Assessment: Faces Faces Pain Scale: Hurts whole lot Pain Location: general with movement/repositioning Pain Descriptors / Indicators: Discomfort;Grimacing Pain Intervention(s): Monitored during session;Repositioned(notified RN)    Home Living                      Prior Function            PT Goals (current goals can now be found in the care plan section) Progress towards PT goals: Progressing toward goals    Frequency    Min 2X/week  PT Plan Current plan remains appropriate    Co-evaluation              AM-PAC PT "6 Clicks" Mobility   Outcome Measure  Help needed turning from your back to your side while in a flat bed without using bedrails?: A Lot Help needed moving from lying on your back to sitting on the side of a flat bed without using bedrails?: Total Help needed moving to and from a bed to a chair (including a wheelchair)?: Total Help needed standing up from a chair using your arms (e.g., wheelchair or bedside chair)?: Total Help needed to walk in hospital room?: Total Help needed climbing 3-5 steps with a railing? : Total 6 Click Score: 7    End of Session Equipment Utilized During Treatment: Oxygen Activity Tolerance: Patient limited by pain Patient left: in bed;with call bell/phone within reach;with bed alarm set Nurse Communication: Mobility status;Patient requests pain meds PT Visit Diagnosis: Muscle weakness (generalized) (M62.81)     Time: [id number] PT Time Calculation (min) (ACUTE ONLY):  20 min  Charges:  $Therapeutic Activity: 8-22 mins                     Arlyce Dice, DPT Acute Rehabilitation Services Office: [phone]  Trena Platt 09/22/2019, 12:43 PM

## 2019-09-22 NOTE — Progress Notes (Signed)
PROGRESS NOTE    Barbara Flowers  [id number] DOB: [birth year] DOA: 09/16/2019 PCP: [name], NP    Brief NarrativeBP:[id number] with hx breast cancer, dementia, hx prior CVA presented on 12/26 with extensive L sided infiltrate and effusion with hypoxemia, requiring nonrebreather. Pt was initially admitted to [facility]:   Active Problems:   Dementia ([place])   CAP (community acquired pneumonia)   Pleural effusion on left   Acute prerenal azotemia   Hypernatremia   Pressure injury of skin  1. Acute hypoxemic respiratory failure 1. Presented initially NRB dependent, now weaned to room air 2. Pt s/p thoracentesis on 12/28 yielding 200cc fluid 3. Had earlier discussed with PCCM. Recommendation for cont abx, currently on rocephin 4. Patient remains with tachypnea to the mid to upper 20's.  5. Overnight, pt required high flow O2, now weaning to [facility]. CT chest ordered yesterday, still pending 6. Desaturated this afternoon requiring up to [facility], improved quickly after suctioning. Suspect element of mucus plugging 7. Will schedule bed percussion, pausing tube feeding during chest PT 2. Tachycardia  1. Intermittent but new findings noted as of 12/20 2. Continue to correct lytes as needed 3. Pt is continued on PRN labetalol with hold parameters 4. Pt hypotensive with dry membranes, decreased urine output. Continuing IVF as tolerated. Pt was given 25g albumin on 12/30 5. Required amiodarone overnight for SVT, currently stable 3. Pneumococcal PNA with pleural effusion 1. Per above, pt is continued on rocephin and is s/p thoracentesis yielding 200cc fluid 2. On minimal O2 support, however remains with tachypnea to the mid-20 to 30's 3. Continuing on rocephin as pt tolerates 4. Proteus bacteremia 1. Blood cx from 12/26 with pan-sensitive proteus species 2. Pt is continued on rocephin as tolerated 3. Cont to follow CBC 5. Acute toxic metabolic encephalopathy 1. Seems  to be stable this AM 2. Will continue abx and supportive care 6. Hypernatremia 1. Resolved as of 12/29 2. Recheck bmet in AM 7. Hypokalemia 1. Normalized as of 12/29 2. Recheck bmet in AM 8. Metabolic acidosis 1. Resolved as of 12/29 and will repeat bmet in AM 9. Unstageable sacral decub ulcer POA 1. Seen by [facility] 10. Moderate protein calorie malnutrition 1. Nutrition following. Continued on tube feeding, pausing during chest PT  DVT prophylaxis: heparin subq Code Status: Full Family Communication: Pt in room, family not at bedside Disposition Plan: Uncertain at this time  Consultants:   Critical Care  Procedures:   Bedside thoracentesis 12/28  Antimicrobials: Anti-infectives (From admission, onward)   Start     Dose/Rate Route Frequency Ordered Stop   09/17/19 1000  azithromycin (ZITHROMAX) 500 mg in sodium chloride 0.9 % 250 mL IVPB  Status:  Discontinued     500 mg 250 mL/hr over 60 Minutes Intravenous Every 24 hours 09/17/19 0920 09/18/19 1157   09/16/19 2200  cefTRIAXone (ROCEPHIN) 2 g in sodium chloride 0.9 % 100 mL IVPB     2 g 200 mL/hr over 30 Minutes Intravenous Every 24 hours 09/16/19 1745     09/16/19 1745  cefTRIAXone (ROCEPHIN) injection 1 g  Status:  Discontinued     1 g Intramuscular Every 24 hours 09/16/19 1740 09/16/19 1743   09/16/19 1515  ceFEPIme (MAXIPIME) 2 g in sodium chloride 0.9 % 100 mL IVPB     2 g 200 mL/hr over 30 Minutes Intravenous  Once 09/16/19 1513 09/16/19 1834   09/16/19 1515  metroNIDAZOLE (FLAGYL) IVPB 500 mg  Status:  Discontinued     500 mg 100 mL/hr over 60 Minutes Intravenous  Once 09/16/19 1513 09/16/19 1741   09/16/19 1515  vancomycin (VANCOCIN) IVPB 1000 mg/200 mL premix  Status:  Discontinued     1,000 mg 200 mL/hr over 60 Minutes Intravenous  Once 09/16/19 1513 09/16/19 1834      Subjective: No complaints this AM  Objective: Vitals:   09/22/19 1156 09/22/19 1200 09/22/19 1215 09/22/19 1300  BP:  94/61    Pulse: 86  84  87  Resp: (!) 26 (!) 26  (!) 26  Temp:   98.2 F (36.8 C)   TempSrc:   Axillary   SpO2: 97% 97%  96%  Weight:      Height:        Intake/Output Summary (Last 24 hours) at 09/22/2019 1352 Last data filed at 09/22/2019 1142 Gross per 24 hour  Intake 3835.03 ml  Output 750 ml  Net 3085.03 ml   Filed Weights   09/20/19 0448 09/21/19 0353 09/22/19 0500  Weight: 58 kg 60.6 kg 59.9 kg    Examination: General exam: Conversant, in no acute distress Respiratory system: normal chest rise, clear, no audible wheezing Cardiovascular system: regular rhythm, s1-s2 Gastrointestinal system: Nondistended, nontender, pos BS Central nervous system: No seizures, no tremors Extremities: No cyanosis, no joint deformities Skin: No rashes, no pallor Psychiatry: Affect normal // no auditory hallucinations   Data Reviewed: I have personally reviewed following labs and imaging studies  CBC: Recent Labs  Lab 09/16/19 1600 09/17/19 0941 09/19/19 0235 09/21/19 0234 09/22/19 0220  WBC 10.5 13.7* 18.2* 18.7* 16.5*  NEUTROABS 7.5 11.7*  --   --   --   HGB 12.8 12.7 10.3* 10.3* 10.3*  HCT 41.2 41.4 33.1* 34.0* 33.9*  MCV 98.1 98.1 95.9 98.0 100.3*  PLT 266 268 182 161 [id number]*   Basic Metabolic Panel: Recent Labs  Lab 09/16/19 1600 09/17/19 0642 09/19/19 0235 09/19/19 1318 09/19/19 1800 09/20/19 0906 09/20/19 1648 09/21/19 0234 09/22/19 0220  NA 147* 144 144  --   --   --   --  143 144  K 4.1 3.4* 4.0  --   --   --   --  3.9 4.1  CL 106 103 107  --   --   --   --  108 108  CO2 30 22 24   --   --   --   --  24 26  GLUCOSE 125* 258* 93  --   --   --   --  147* 133*  BUN 57* 41* 24*  --   --   --   --  30* 27*  CREATININE 1.14* 0.71 0.57  --   --   --   --  0.50 0.47  CALCIUM 9.2 8.0* 8.5*  --   --   --   --  8.0* 8.4*  MG  --   --   --  1.8 1.8 1.8 1.7 1.7  --   PHOS  --   --   --  2.0* 2.0* 1.6* 1.7*  --   --    GFR: Estimated Creatinine Clearance: 62.8 mL/min (by C-G formula based on  SCr of 0.47 mg/dL). Liver Function Tests: Recent Labs  Lab 09/16/19 1600 09/19/19 0235 09/21/19 0234 09/22/19 0220  AST 27 80* 50* 41  ALT 27 37 38 33  ALKPHOS 95 241* 137* 102  BILITOT 1.2 1.0 0.7 0.8  PROT 7.8 5.5* 5.1* 5.9*  ALBUMIN 3.1* 3.1* 2.5* 3.4*   No results for input(s): LIPASE, AMYLASE in the last 168 hours. No results for input(s): AMMONIA in the last 168 hours. Coagulation Profile: Recent Labs  Lab 09/16/19 1600  INR 1.1   Cardiac Enzymes: No results for input(s): CKTOTAL, CKMB, CKMBINDEX, TROPONINI in the last 168 hours. BNP (last 3 results) No results for input(s): PROBNP in the last 8760 hours. HbA1C: No results for input(s): HGBA1C in the last 72 hours. CBG: Recent Labs  Lab 09/21/19 2008 09/21/19 2314 09/22/19 0315 09/22/19 0731 09/22/19 1129  GLUCAP 115* 116* 146* 105* 114*   Lipid Profile: No results for input(s): CHOL, HDL, LDLCALC, TRIG, CHOLHDL, LDLDIRECT in the last 72 hours. Thyroid Function Tests: No results for input(s): TSH, T4TOTAL, FREET4, T3FREE, THYROIDAB in the last 72 hours. Anemia Panel: No results for input(s): VITAMINB12, FOLATE, FERRITIN, TIBC, IRON, RETICCTPCT in the last 72 hours. Sepsis Labs: Recent Labs  Lab 09/16/19 1600 09/16/19 1830 09/18/19 0259 09/19/19 0509  PROCALCITON 2.01  --  0.60 0.40  LATICACIDVEN 1.6 1.4  --   --     Recent Results (from the past 240 hour(s))  Blood Culture (routine x 2)     Status: None   Collection Time: 09/16/19  4:00 PM   Specimen: BLOOD  Result Value Ref Range Status   Specimen Description   Final    BLOOD RIGHT ANTECUBITAL Performed at [facility] [address], [place], [place] [address]    Special Requests   Final    BOTTLES DRAWN AEROBIC AND ANAEROBIC Blood Culture results may not be optimal due to an inadequate volume of blood received in culture bottles Performed at [facility] [address] [place], [place] [address]     Culture   Final    NO GROWTH 5 DAYS Performed at [facility], [place] [address], [place], [place] [address]    Report Status 09/21/2019 FINAL  Final  Blood Culture (routine x 2)     Status: Abnormal   Collection Time: 09/16/19  4:01 PM   Specimen: BLOOD RIGHT FOREARM  Result Value Ref Range Status   Specimen Description   Final    BLOOD RIGHT FOREARM Performed at [facility] [address], [place], [place] [address]    Special Requests   Final    BOTTLES DRAWN AEROBIC AND ANAEROBIC Blood Culture adequate volume Performed at [facility] [address] [place], [place] [address]    Culture  Setup Time   Final    [place]onell Grandchild NEGATIVE RODS GRAM POSITIVE COCCI AEROBIC BOTTLE ONLY CRITICAL RESULT CALLED TO, READ BACK BY AND VERIFIED WITH: PHARMD D WOFFARD [id number] AT 1440 BY CM    Culture (A)  Final    PROTEUS MIRABILIS STAPHYLOCOCCUS SPECIES (COAGULASE NEGATIVE) ORGANISM 2 THE SIGNIFICANCE OF ISOLATING THIS ORGANISM FROM A SINGLE SET OF BLOOD CULTURES WHEN MULTIPLE SETS ARE DRAWN IS UNCERTAIN. PLEASE NOTIFY THE MICROBIOLOGY DEPARTMENT WITHIN ONE WEEK IF SPECIATION AND SENSITIVITIES ARE REQUIRED. Performed at [facility], [place] [address], [place], [place] [address]    Report Status 09/20/2019 FINAL  Final   Organism ID, Bacteria PROTEUS MIRABILIS  Final      Susceptibility   Proteus mirabilis - MIC*    AMPICILLIN <=2 SENSITIVE Sensitive     CEFAZOLIN <=4 SENSITIVE Sensitive     CEFEPIME <=1 SENSITIVE Sensitive     CEFTAZIDIME <=1 SENSITIVE Sensitive     CEFTRIAXONE <=  1 SENSITIVE Sensitive     CIPROFLOXACIN <=0.25 SENSITIVE Sensitive     GENTAMICIN <=1 SENSITIVE Sensitive     IMIPENEM 1 SENSITIVE Sensitive     TRIMETH/SULFA <=20 SENSITIVE Sensitive     AMPICILLIN/SULBACTAM <=2 SENSITIVE Sensitive     PIP/TAZO <=4 SENSITIVE Sensitive     * PROTEUS MIRABILIS  Blood Culture ID Panel (Reflexed)     Status: Abnormal   Collection Time:  09/16/19  4:01 PM  Result Value Ref Range Status   Enterococcus species NOT DETECTED NOT DETECTED Final   Listeria monocytogenes NOT DETECTED NOT DETECTED Final   Staphylococcus species DETECTED (A) NOT DETECTED Final    Comment: Methicillin (oxacillin) resistant coagulase negative staphylococcus. Possible blood culture contaminant (unless isolated from more than one blood culture draw or clinical case suggests pathogenicity). No antibiotic treatment is indicated for blood  culture contaminants. CRITICAL RESULT CALLED TO, READ BACK BY AND VERIFIED WITH: PHARMD D WAFFORD [id number] AT 1440 BY CM    Staphylococcus aureus (BCID) NOT DETECTED NOT DETECTED Final   Methicillin resistance DETECTED (A) NOT DETECTED Final    Comment: CRITICAL RESULT CALLED TO, READ BACK BY AND VERIFIED WITH: PHARMD D WOFFORD [id number] AT 1440 BY CM    Streptococcus species NOT DETECTED NOT DETECTED Final   Streptococcus agalactiae NOT DETECTED NOT DETECTED Final   Streptococcus pneumoniae NOT DETECTED NOT DETECTED Final   Streptococcus pyogenes NOT DETECTED NOT DETECTED Final   Acinetobacter baumannii NOT DETECTED NOT DETECTED Final   Enterobacteriaceae species DETECTED (A) NOT DETECTED Final    Comment: Enterobacteriaceae represent a large family of gram-negative bacteria, not a single organism. CRITICAL RESULT CALLED TO, READ BACK BY AND VERIFIED WITH: PHARMD D WAFFORD [id number] AT 1440 BY CM    Enterobacter cloacae complex NOT DETECTED NOT DETECTED Final   Escherichia coli NOT DETECTED NOT DETECTED Final   Klebsiella oxytoca NOT DETECTED NOT DETECTED Final   Klebsiella pneumoniae NOT DETECTED NOT DETECTED Final   Proteus species DETECTED (A) NOT DETECTED Final    Comment: CRITICAL RESULT CALLED TO, READ BACK BY AND VERIFIED WITH: PHARMD D WAFFORD [id number] AT 1440 BY CM    Serratia marcescens NOT DETECTED NOT DETECTED Final   Carbapenem resistance NOT DETECTED NOT DETECTED Final   Haemophilus influenzae NOT DETECTED  NOT DETECTED Final   Neisseria meningitidis NOT DETECTED NOT DETECTED Final   Pseudomonas aeruginosa NOT DETECTED NOT DETECTED Final   Candida albicans NOT DETECTED NOT DETECTED Final   Candida glabrata NOT DETECTED NOT DETECTED Final   Candida krusei NOT DETECTED NOT DETECTED Final   Candida parapsilosis NOT DETECTED NOT DETECTED Final   Candida tropicalis NOT DETECTED NOT DETECTED Final    Comment: Performed at [facility], [place] [address] [place], [place] [address]  Respiratory Panel by RT PCR (Flu A&B, Covid) - Nasopharyngeal Swab     Status: None   Collection Time: 09/16/19  4:42 PM   Specimen: Nasopharyngeal Swab  Result Value Ref Range Status   SARS Coronavirus 2 by RT PCR NEGATIVE NEGATIVE Final    Comment: (NOTE) SARS-CoV-2 target nucleic acids are NOT DETECTED. The SARS-CoV-2 RNA is generally detectable in upper respiratoy specimens during the acute phase of infection. The lowest concentration of SARS-CoV-2 viral copies this assay can detect is 131 copies/mL. A negative result does not preclude SARS-Cov-2 infection and should not be used as the sole basis for treatment or other patient management decisions. A negative result may occur with  improper specimen  collection/handling, submission of specimen other than nasopharyngeal swab, presence of viral mutation(s) within the areas targeted by this assay, and inadequate number of viral copies (<131 copies/mL). A negative result must be combined with clinical observations, patient history, and epidemiological information. The expected result is Negative. Fact Sheet for Patients:  PinkCheek.be Fact Sheet for Healthcare Providers:  GravelBags.it This test is not yet ap proved or cleared by the [place] FDA and  has been authorized for detection and/or diagnosis of SARS-CoV-2 by FDA under an Emergency Use Authorization (EUA). This EUA will remain  in effect  (meaning this test can be used) for the duration of the COVID-19 declaration under Section 564(b)(1) of the Act, 21 U.S.C. section 360bbb-3(b)(1), unless the authorization is terminated or revoked sooner.    Influenza A by PCR NEGATIVE NEGATIVE Final   Influenza B by PCR NEGATIVE NEGATIVE Final    Comment: (NOTE) The Xpert Xpress SARS-CoV-2/FLU/RSV assay is intended as an aid in  the diagnosis of influenza from Nasopharyngeal swab specimens and  should not be used as a sole basis for treatment. Nasal washings and  aspirates are unacceptable for Xpert Xpress SARS-CoV-2/FLU/RSV  testing. Fact Sheet for Patients: PinkCheek.be Fact Sheet for Healthcare Providers: GravelBags.it This test is not yet approved or cleared by the [place] FDA and  has been authorized for detection and/or diagnosis of SARS-CoV-2 by  FDA under an Emergency Use Authorization (EUA). This EUA will remain  in effect (meaning this test can be used) for the duration of the  Covid-19 declaration under Section 564(b)(1) of the Act, 21  U.S.C. section 360bbb-3(b)(1), unless the authorization is  terminated or revoked. Performed at [facility], [place] [address] [place], [place] [address]   Urine culture     Status: None   Collection Time: 09/16/19  6:33 PM   Specimen: In/Out Cath Urine  Result Value Ref Range Status   Specimen Description   Final    IN/OUT CATH URINE Performed at [facility] [address], [place], [place] [address]    Special Requests   Final    NONE Performed at [facility], [place] [address], [place], [place] [address]    Culture   Final    NO GROWTH Performed at [facility], [place] [address] [place], [place] [address]    Report Status 09/18/2019 FINAL  Final  MRSA PCR Screening     Status: None   Collection Time: 09/17/19  4:12 AM   Specimen: Nasopharyngeal    Result Value Ref Range Status   MRSA by PCR NEGATIVE NEGATIVE Final    Comment:        The GeneXpert MRSA Assay (FDA approved for NASAL specimens only), is one component of a comprehensive MRSA colonization surveillance program. It is not intended to diagnose MRSA infection nor to guide or monitor treatment for MRSA infections. Performed at [facility], [place] [address], [place], [place] [address]   Body fluid culture (includes gram stain)     Status: None   Collection Time: 09/18/19 11:01 AM   Specimen: Pleural Fluid  Result Value Ref Range Status   Specimen Description   Final    PLEURAL Performed at [facility] [address] [place], [place] [address]    Special Requests   Final    NONE Performed at [facility], [place] [address] [place], [place] [address]    Gram Stain   Final  ABUNDANT WBC PRESENT, PREDOMINANTLY PMN NO ORGANISMS SEEN    Culture   Final    NO GROWTH 3 DAYS Performed at [facility] [address] [place], [place] [address]    Report Status 09/22/2019 FINAL  Final     Radiology Studies: No results found.  Scheduled Meds: . Chlorhexidine Gluconate Cloth  6 each Topical Daily  . collagenase   Topical Daily  . feeding supplement (OSMOLITE 1.5 CAL)  1,000 mL Per Tube Q24H  . feeding supplement (PRO-STAT SUGAR FREE 64)  30 mL Per Tube BID  . free water  100 mL Per Tube Q6H  . Gerhardt's butt cream   Topical BID  . heparin  5,000 Units Subcutaneous Q8H  . insulin aspart  0-9 Units Subcutaneous Q4H  . mouth rinse  15 mL Mouth Rinse BID   Continuous Infusions: . cefTRIAXone (ROCEPHIN)  IV Stopped (09/21/19 2241)  . lactated ringers 75 mL/hr at 09/22/19 1142     LOS: 6 days   Marylu Lund, MD [facility] Pager On Amion  If 7PM-7AM, please contact night-coverage 09/22/2019, 1:52 PM

## 2019-09-23 ENCOUNTER — Inpatient Hospital Stay (HOSPITAL_COMMUNITY): Payer: Medicare Other

## 2019-09-23 LAB — GLUCOSE, CAPILLARY
Glucose-Capillary: 113 mg/dL — ABNORMAL HIGH (ref 70–99)
Glucose-Capillary: 117 mg/dL — ABNORMAL HIGH (ref 70–99)
Glucose-Capillary: 119 mg/dL — ABNORMAL HIGH (ref 70–99)
Glucose-Capillary: 124 mg/dL — ABNORMAL HIGH (ref 70–99)
Glucose-Capillary: 132 mg/dL — ABNORMAL HIGH (ref 70–99)
Glucose-Capillary: 140 mg/dL — ABNORMAL HIGH (ref 70–99)

## 2019-09-23 LAB — COMPREHENSIVE METABOLIC PANEL
ALT: 76 U/L — ABNORMAL HIGH (ref 0–44)
AST: 199 U/L — ABNORMAL HIGH (ref 15–41)
Albumin: 2.9 g/dL — ABNORMAL LOW (ref 3.5–5.0)
Alkaline Phosphatase: 163 U/L — ABNORMAL HIGH (ref 38–126)
Anion gap: 9 (ref 5–15)
BUN: 30 mg/dL — ABNORMAL HIGH (ref 8–23)
CO2: 25 mmol/L (ref 22–32)
Calcium: 7.8 mg/dL — ABNORMAL LOW (ref 8.9–10.3)
Chloride: 107 mmol/L (ref 98–111)
Creatinine, Ser: 0.49 mg/dL (ref 0.44–1.00)
GFR calc Af Amer: >60 mL/min (ref >60)
GFR calc non Af Amer: >60 mL/min (ref >60)
Glucose, Bld: 124 mg/dL — ABNORMAL HIGH (ref 70–99)
Potassium: 4.1 mmol/L (ref 3.5–5.1)
Sodium: 141 mmol/L (ref 135–145)
Total Bilirubin: 0.5 mg/dL (ref 0.3–1.2)
Total Protein: 5 g/dL — ABNORMAL LOW (ref 6.5–8.1)

## 2019-09-23 LAB — CBC
HCT: 27.4 % — ABNORMAL LOW (ref 36.0–46.0)
Hemoglobin: 8.5 g/dL — ABNORMAL LOW (ref 12.0–15.0)
MCH: 29.9 pg (ref 26.0–34.0)
MCHC: 31 g/dL (ref 30.0–36.0)
MCV: 96.5 fL (ref 80.0–100.0)
Platelets: 133 10*3/uL — ABNORMAL LOW (ref 150–400)
RBC: 2.84 MIL/uL — ABNORMAL LOW (ref 3.87–5.11)
RDW: 14 % (ref 11.5–15.5)
WBC: 14.6 10*3/uL — ABNORMAL HIGH (ref 4.0–10.5)
nRBC: 0.2 % (ref 0.0–0.2)

## 2019-09-23 LAB — BODY FLUID CELL COUNT WITH DIFFERENTIAL
Lymphs, Fluid: 17 %
Monocyte-Macrophage-Serous Fluid: 8 % — ABNORMAL LOW (ref 50–90)
Neutrophil Count, Fluid: 75 % — ABNORMAL HIGH (ref 0–25)
Total Nucleated Cell Count, Fluid: 962 cu mm (ref 0–1000)

## 2019-09-23 LAB — LACTATE DEHYDROGENASE, PLEURAL OR PERITONEAL FLUID: LD, Fluid: 97 U/L — ABNORMAL HIGH (ref 3–23)

## 2019-09-23 LAB — PROTEIN, PLEURAL OR PERITONEAL FLUID: Total protein, fluid: <3 g/dL

## 2019-09-23 MED ORDER — AMIODARONE IV BOLUS ONLY 150 MG/100ML
150.0000 mg | Freq: Once | INTRAVENOUS | Status: DC
  Filled 2019-09-23: qty 100

## 2019-09-23 MED ORDER — LIDOCAINE HCL 1 % IJ SOLN
INTRAMUSCULAR | Status: AC
  Filled 2019-09-23: qty 10

## 2019-09-23 MED ORDER — LACTATED RINGERS IV SOLN: INTRAVENOUS | Status: DC

## 2019-09-23 MED ORDER — LACTATED RINGERS IV BOLUS
500.0000 mL | Freq: Once | INTRAVENOUS | Status: AC
  Administered 2019-09-23: 500 mL via INTRAVENOUS

## 2019-09-23 NOTE — Progress Notes (Signed)
Pt having episodes of non-sustained SVT in the 150's. Pt is asymptomatic. [facility] on-call NP notified and aware of situation. HR is back in the 80's. RN will continue to monitor the pt.

## 2019-09-23 NOTE — Progress Notes (Signed)
PROGRESS NOTE    Barbara Flowers  [id number] DOB: 22-Jan-1951 DOA: 09/16/2019 PCP: [name], NP    Brief NarrativeBP:[id number] with hx breast cancer, dementia, hx prior CVA presented on 12/26 with extensive L sided infiltrate and effusion with hypoxemia, requiring nonrebreather. Pt was initially admitted to [facility]:   Active Problems:   Dementia ([place])   CAP (community acquired pneumonia)   Pleural effusion on left   Acute prerenal azotemia   Hypernatremia   Pressure injury of skin  1. Acute hypoxemic respiratory failure 1. Presented initially NRB dependent, now weaned to room air 2. Pt s/p thoracentesis on 12/28 yielding 200cc fluid 3. Had earlier discussed with PCCM. Recommendation for cont abx, currently on rocephin 4. Patient had remained with tachypnea to the mid to upper 20's and increased O2 requirements  5. CT chest reviewed, findings of large L sided effusion. Pt is now s/p L thoracentesis yielding 820cc 2. Tachycardia  1. Intermittent but new findings noted as of 12/20 2. Continue to correct lytes as needed 3. Pt is continued on PRN labetalol with hold parameters 4. Pt continued on IVF hydration 3. Pneumococcal PNA with pleural effusion 1. Per above, pt is continued on rocephin and is s/p thoracentesis yielding 200cc fluid and again on 1/2 yielding 820cc 2. Continued on rocephin as tolerated 4. Proteus bacteremia 1. Blood cx from 12/26 with pan-sensitive proteus species 2. Pt is continued on rocephin as pt tolerates 3. Cont to follow CBC 5. Acute toxic metabolic encephalopathy 1. Seems to be stable this AM 2. Will continue abx and supportive care 6. Hypernatremia 1. Resolved as of 12/29 2. Repeat bmet in AM 7. Hypokalemia 1. Normalized as of 12/29 2. Recheck bmet in AM 8. Metabolic acidosis 1. Resolved as of 12/29 and will repeat bmet in AM 9. Unstageable sacral decub ulcer POA 1. Seen by [facility] 10. Moderate protein calorie  malnutrition 1. Nutrition following. Continued on tube feeding as tolerated  DVT prophylaxis: heparin subq Code Status: Full Family Communication: Pt in room, family not at bedside Disposition Plan: Uncertain at this time  Consultants:   Critical Care  Procedures:   Bedside thoracentesis 12/28  Antimicrobials: Anti-infectives (From admission, onward)   Start     Dose/Rate Route Frequency Ordered Stop   09/17/19 1000  azithromycin (ZITHROMAX) 500 mg in sodium chloride 0.9 % 250 mL IVPB  Status:  Discontinued     500 mg 250 mL/hr over 60 Minutes Intravenous Every 24 hours 09/17/19 0920 09/18/19 1157   09/16/19 2200  cefTRIAXone (ROCEPHIN) 2 g in sodium chloride 0.9 % 100 mL IVPB     2 g 200 mL/hr over 30 Minutes Intravenous Every 24 hours 09/16/19 1745     09/16/19 1745  cefTRIAXone (ROCEPHIN) injection 1 g  Status:  Discontinued     1 g Intramuscular Every 24 hours 09/16/19 1740 09/16/19 1743   09/16/19 1515  ceFEPIme (MAXIPIME) 2 g in sodium chloride 0.9 % 100 mL IVPB     2 g 200 mL/hr over 30 Minutes Intravenous  Once 09/16/19 1513 09/16/19 1834   09/16/19 1515  metroNIDAZOLE (FLAGYL) IVPB 500 mg  Status:  Discontinued     500 mg 100 mL/hr over 60 Minutes Intravenous  Once 09/16/19 1513 09/16/19 1741   09/16/19 1515  vancomycin (VANCOCIN) IVPB 1000 mg/200 mL premix  Status:  Discontinued     1,000 mg 200 mL/hr over 60 Minutes Intravenous  Once 09/16/19 1513 09/16/19 1834  Subjective: Without complaints  Objective: Vitals:   09/23/19 1255 09/23/19 1300 09/23/19 1400 09/23/19 1500  BP: (!) 104/42  (!) 104/45   Pulse: 87 90 87 86  Resp: (!) 28 (!) 28 (!) 24 (!) 27  Temp:      TempSrc:      SpO2: 96% 95% 100% 100%  Weight:      Height:        Intake/Output Summary (Last 24 hours) at 09/23/2019 1549 Last data filed at 09/23/2019 1400 Gross per 24 hour  Intake 3468.11 ml  Output 675 ml  Net 2793.11 ml   Filed Weights   09/21/19 0353 09/22/19 0500 09/23/19  0355  Weight: 60.6 kg 59.9 kg 59.9 kg    Examination: General exam: Awake, laying in bed, in nad Respiratory system: Increased resp effort, no wheezing Cardiovascular system: regular rate, s1, s2 Gastrointestinal system: Soft, nondistended, positive BS Central nervous system: CN2-12 grossly intact, strength intact Extremities: Perfused, no clubbing Skin: Normal skin turgor, no notable skin lesions seen Psychiatry: Mood normal // no visual hallucinations   Data Reviewed: I have personally reviewed following labs and imaging studies  CBC: Recent Labs  Lab 09/16/19 1600 09/17/19 0941 09/19/19 0235 09/21/19 0234 09/22/19 0220 09/23/19 0144  WBC 10.5 13.7* 18.2* 18.7* 16.5* 14.6*  NEUTROABS 7.5 11.7*  --   --   --   --   HGB 12.8 12.7 10.3* 10.3* 10.3* 8.5*  HCT 41.2 41.4 33.1* 34.0* 33.9* 27.4*  MCV 98.1 98.1 95.9 98.0 100.3* 96.5  PLT 266 268 182 161 141* [id number]*   Basic Metabolic Panel: Recent Labs  Lab 09/17/19 0642 09/19/19 0235 09/19/19 1318 09/19/19 1800 09/20/19 0906 09/20/19 1648 09/21/19 0234 09/22/19 0220 09/23/19 0144  NA 144 144  --   --   --   --  143 144 141  K 3.4* 4.0  --   --   --   --  3.9 4.1 4.1  CL 103 107  --   --   --   --  108 108 107  CO2 22 24  --   --   --   --  24 26 25   GLUCOSE 258* 93  --   --   --   --  147* 133* 124*  BUN 41* 24*  --   --   --   --  30* 27* 30*  CREATININE 0.71 0.57  --   --   --   --  0.50 0.47 0.49  CALCIUM 8.0* 8.5*  --   --   --   --  8.0* 8.4* 7.8*  MG  --   --  1.8 1.8 1.8 1.7 1.7  --   --   PHOS  --   --  2.0* 2.0* 1.6* 1.7*  --   --   --    GFR: Estimated Creatinine Clearance: 62.8 mL/min (by C-G formula based on SCr of 0.49 mg/dL). Liver Function Tests: Recent Labs  Lab 09/16/19 1600 09/19/19 0235 09/21/19 0234 09/22/19 0220 09/23/19 0144  AST 27 80* 50* 41 199*  ALT 27 37 38 33 76*  ALKPHOS 95 241* 137* 102 163*  BILITOT 1.2 1.0 0.7 0.8 0.5  PROT 7.8 5.5* 5.1* 5.9* 5.0*  ALBUMIN 3.1* 3.1* 2.5*  3.4* 2.9*   No results for input(s): LIPASE, AMYLASE in the last 168 hours. No results for input(s): AMMONIA in the last 168 hours. Coagulation Profile: Recent Labs  Lab 09/16/19 1600  INR 1.1  Cardiac Enzymes: No results for input(s): CKTOTAL, CKMB, CKMBINDEX, TROPONINI in the last 168 hours. BNP (last 3 results) No results for input(s): PROBNP in the last 8760 hours. HbA1C: No results for input(s): HGBA1C in the last 72 hours. CBG: Recent Labs  Lab 09/22/19 1942 09/22/19 2348 09/23/19 0329 09/23/19 0734 09/23/19 1140  GLUCAP 142* 127* 113* 140* 119*   Lipid Profile: No results for input(s): CHOL, HDL, LDLCALC, TRIG, CHOLHDL, LDLDIRECT in the last 72 hours. Thyroid Function Tests: No results for input(s): TSH, T4TOTAL, FREET4, T3FREE, THYROIDAB in the last 72 hours. Anemia Panel: No results for input(s): VITAMINB12, FOLATE, FERRITIN, TIBC, IRON, RETICCTPCT in the last 72 hours. Sepsis Labs: Recent Labs  Lab 09/16/19 1600 09/16/19 1830 09/18/19 0259 09/19/19 0509  PROCALCITON 2.01  --  0.60 0.40  LATICACIDVEN 1.6 1.4  --   --     Recent Results (from the past 240 hour(s))  Blood Culture (routine x 2)     Status: None   Collection Time: 09/16/19  4:00 PM   Specimen: BLOOD  Result Value Ref Range Status   Specimen Description   Final    BLOOD RIGHT ANTECUBITAL Performed at [facility] [address] [place], [place] [address]    Special Requests   Final    BOTTLES DRAWN AEROBIC AND ANAEROBIC Blood Culture results may not be optimal due to an inadequate volume of blood received in culture bottles Performed at [facility] [address] [place], [place] [address]    Culture   Final    NO GROWTH 5 DAYS Performed at [facility], [place] [address] [place], [place] [address]    Report Status 09/21/2019 FINAL  Final  Blood Culture (routine x 2)     Status: Abnormal   Collection Time: 09/16/19  4:01 PM   Specimen:  BLOOD RIGHT FOREARM  Result Value Ref Range Status   Specimen Description   Final    BLOOD RIGHT FOREARM Performed at [facility] [address], [place], [place] [address]    Special Requests   Final    BOTTLES DRAWN AEROBIC AND ANAEROBIC Blood Culture adequate volume Performed at [facility] [address], [place], [place] [address]    Culture  Setup Time   Final    Lonell Grandchild NEGATIVE RODS GRAM POSITIVE COCCI AEROBIC BOTTLE ONLY CRITICAL RESULT CALLED TO, READ BACK BY AND VERIFIED WITH: PHARMD D WOFFARD [id number] AT 1440 BY CM    Culture (A)  Final    PROTEUS MIRABILIS STAPHYLOCOCCUS SPECIES (COAGULASE NEGATIVE) ORGANISM 2 THE SIGNIFICANCE OF ISOLATING THIS ORGANISM FROM A SINGLE SET OF BLOOD CULTURES WHEN MULTIPLE SETS ARE DRAWN IS UNCERTAIN. PLEASE NOTIFY THE MICROBIOLOGY DEPARTMENT WITHIN ONE WEEK IF SPECIATION AND SENSITIVITIES ARE REQUIRED. Performed at [facility], [place] [address] [place], [place] [address]    Report Status 09/20/2019 FINAL  Final   Organism ID, Bacteria PROTEUS MIRABILIS  Final      Susceptibility   Proteus mirabilis - MIC*    AMPICILLIN <=2 SENSITIVE Sensitive     CEFAZOLIN <=4 SENSITIVE Sensitive     CEFEPIME <=1 SENSITIVE Sensitive     CEFTAZIDIME <=1 SENSITIVE Sensitive     CEFTRIAXONE <=1 SENSITIVE Sensitive     CIPROFLOXACIN <=0.25 SENSITIVE Sensitive     GENTAMICIN <=1 SENSITIVE Sensitive     IMIPENEM 1 SENSITIVE Sensitive     TRIMETH/SULFA <=20 SENSITIVE Sensitive     AMPICILLIN/SULBACTAM <=2  SENSITIVE Sensitive     PIP/TAZO <=4 SENSITIVE Sensitive     * PROTEUS MIRABILIS  Blood Culture ID Panel (Reflexed)     Status: Abnormal   Collection Time: 09/16/19  4:01 PM  Result Value Ref Range Status   Enterococcus species NOT DETECTED NOT DETECTED Final   Listeria monocytogenes NOT DETECTED NOT DETECTED Final   Staphylococcus species DETECTED (A) NOT DETECTED Final    Comment: Methicillin (oxacillin)  resistant coagulase negative staphylococcus. Possible blood culture contaminant (unless isolated from more than one blood culture draw or clinical case suggests pathogenicity). No antibiotic treatment is indicated for blood  culture contaminants. CRITICAL RESULT CALLED TO, READ BACK BY AND VERIFIED WITH: PHARMD D WAFFORD [id number] AT 1440 BY CM    Staphylococcus aureus (BCID) NOT DETECTED NOT DETECTED Final   Methicillin resistance DETECTED (A) NOT DETECTED Final    Comment: CRITICAL RESULT CALLED TO, READ BACK BY AND VERIFIED WITH: PHARMD D WOFFORD [id number] AT 1440 BY CM    Streptococcus species NOT DETECTED NOT DETECTED Final   Streptococcus agalactiae NOT DETECTED NOT DETECTED Final   Streptococcus pneumoniae NOT DETECTED NOT DETECTED Final   Streptococcus pyogenes NOT DETECTED NOT DETECTED Final   Acinetobacter baumannii NOT DETECTED NOT DETECTED Final   Enterobacteriaceae species DETECTED (A) NOT DETECTED Final    Comment: Enterobacteriaceae represent a large family of gram-negative bacteria, not a single organism. CRITICAL RESULT CALLED TO, READ BACK BY AND VERIFIED WITH: PHARMD D WAFFORD [id number] AT 1440 BY CM    Enterobacter cloacae complex NOT DETECTED NOT DETECTED Final   Escherichia coli NOT DETECTED NOT DETECTED Final   Klebsiella oxytoca NOT DETECTED NOT DETECTED Final   Klebsiella pneumoniae NOT DETECTED NOT DETECTED Final   Proteus species DETECTED (A) NOT DETECTED Final    Comment: CRITICAL RESULT CALLED TO, READ BACK BY AND VERIFIED WITH: PHARMD D WAFFORD [id number] AT 1440 BY CM    Serratia marcescens NOT DETECTED NOT DETECTED Final   Carbapenem resistance NOT DETECTED NOT DETECTED Final   Haemophilus influenzae NOT DETECTED NOT DETECTED Final   Neisseria meningitidis NOT DETECTED NOT DETECTED Final   Pseudomonas aeruginosa NOT DETECTED NOT DETECTED Final   Candida albicans NOT DETECTED NOT DETECTED Final   Candida glabrata NOT DETECTED NOT DETECTED Final   Candida krusei  NOT DETECTED NOT DETECTED Final   Candida parapsilosis NOT DETECTED NOT DETECTED Final   Candida tropicalis NOT DETECTED NOT DETECTED Final    Comment: Performed at [facility], [place] [address] [place], [place] [address]  Respiratory Panel by RT PCR (Flu A&B, Covid) - Nasopharyngeal Swab     Status: None   Collection Time: 09/16/19  4:42 PM   Specimen: Nasopharyngeal Swab  Result Value Ref Range Status   SARS Coronavirus 2 by RT PCR NEGATIVE NEGATIVE Final    Comment: (NOTE) SARS-CoV-2 target nucleic acids are NOT DETECTED. The SARS-CoV-2 RNA is generally detectable in upper respiratoy specimens during the acute phase of infection. The lowest concentration of SARS-CoV-2 viral copies this assay can detect is 131 copies/mL. A negative result does not preclude SARS-Cov-2 infection and should not be used as the sole basis for treatment or other patient management decisions. A negative result may occur with  improper specimen collection/handling, submission of specimen other than nasopharyngeal swab, presence of viral mutation(s) within the areas targeted by this assay, and inadequate number of viral copies (<131 copies/mL). A negative result must be combined with clinical observations, patient history, and epidemiological information.  The expected result is Negative. Fact Sheet for Patients:  PinkCheek.be Fact Sheet for Healthcare Providers:  GravelBags.it This test is not yet ap proved or cleared by the [place] FDA and  has been authorized for detection and/or diagnosis of SARS-CoV-2 by FDA under an Emergency Use Authorization (EUA). This EUA will remain  in effect (meaning this test can be used) for the duration of the COVID-19 declaration under Section 564(b)(1) of the Act, 21 U.S.C. section 360bbb-3(b)(1), unless the authorization is terminated or revoked sooner.    Influenza A by PCR NEGATIVE NEGATIVE Final    Influenza B by PCR NEGATIVE NEGATIVE Final    Comment: (NOTE) The Xpert Xpress SARS-CoV-2/FLU/RSV assay is intended as an aid in  the diagnosis of influenza from Nasopharyngeal swab specimens and  should not be used as a sole basis for treatment. Nasal washings and  aspirates are unacceptable for Xpert Xpress SARS-CoV-2/FLU/RSV  testing. Fact Sheet for Patients: PinkCheek.be Fact Sheet for Healthcare Providers: GravelBags.it This test is not yet approved or cleared by the [place] FDA and  has been authorized for detection and/or diagnosis of SARS-CoV-2 by  FDA under an Emergency Use Authorization (EUA). This EUA will remain  in effect (meaning this test can be used) for the duration of the  Covid-19 declaration under Section 564(b)(1) of the Act, 21  U.S.C. section 360bbb-3(b)(1), unless the authorization is  terminated or revoked. Performed at [facility], [place] [address], [place], [place] [address]   Urine culture     Status: None   Collection Time: 09/16/19  6:33 PM   Specimen: In/Out Cath Urine  Result Value Ref Range Status   Specimen Description   Final    IN/OUT CATH URINE Performed at [facility] [address], [place], [place] [address]    Special Requests   Final    NONE Performed at [facility], [place] [address], [place], [place] [address]    Culture   Final    NO GROWTH Performed at [facility], [place] [address], [place], [place] [address]    Report Status 09/18/2019 FINAL  Final  MRSA PCR Screening     Status: None   Collection Time: 09/17/19  4:12 AM   Specimen: Nasopharyngeal  Result Value Ref Range Status   MRSA by PCR NEGATIVE NEGATIVE Final    Comment:        The GeneXpert MRSA Assay (FDA approved for NASAL specimens only), is one component of a comprehensive MRSA colonization surveillance program. It is not intended to  diagnose MRSA infection nor to guide or monitor treatment for MRSA infections. Performed at [facility], [place] [address] [place], [place] [address]   Body fluid culture (includes gram stain)     Status: None   Collection Time: 09/18/19 11:01 AM   Specimen: Pleural Fluid  Result Value Ref Range Status   Specimen Description   Final    PLEURAL Performed at [facility] [address], [place], [place] [address]    Special Requests   Final    NONE Performed at [facility], [place] [address] [place], [place] [address]    Gram Stain   Final    ABUNDANT WBC PRESENT, PREDOMINANTLY PMN NO ORGANISMS SEEN    Culture   Final    NO GROWTH 3 DAYS Performed at [facility], [place] [address] [place], [place] [address]    Report  Status 09/22/2019 FINAL  Final     Radiology Studies: CT CHEST W CONTRAST  Result Date: 09/22/2019 CLINICAL DATA:  Pneumonia, effusion or abscess suspected, large pneumonia with diffusion requiring thoracentesis, persistent to kidney and leukocytosis EXAM: CT CHEST WITH CONTRAST TECHNIQUE: Multidetector CT imaging of the chest was performed during intravenous contrast administration. CONTRAST:  83mL OMNIPAQUE IOHEXOL 300 MG/ML  SOLN COMPARISON:  Chest radiographs, 09/19/2019 FINDINGS: Cardiovascular: Aortic atherosclerosis. Normal heart size. Scattered coronary artery calcifications. No pericardial effusion. Mediastinum/Nodes: No enlarged mediastinal, hilar, or axillary lymph nodes. Thyroid gland, trachea, and esophagus demonstrate no significant findings. Lungs/Pleura: There is a large left pleural effusion with near total atelectasis or consolidation of the left lung. Small right pleural effusion and associated atelectasis or consolidation of the dependent right lung base. The left mainstem and distal bronchi and right intralobar and distal bronchi are fluid and debris filled. Upper Abdomen: No acute abnormality.  Musculoskeletal: No chest wall mass or suspicious bone lesions identified. Dystrophic appearing calcifications in the lateral left breast. IMPRESSION: 1. Large left pleural effusion with near total atelectasis or consolidation of the left lung. 2. Small right pleural effusion and associated atelectasis or consolidation of the dependent right lung base. 3. The left mainstem and distal bronchi and right intralobar and distal bronchi are fluid and debris filled. 4. Coronary artery disease. Aortic Atherosclerosis (ICD10-I70.0). Electronically Signed   By: Eddie Candle M.D.   On: 09/22/2019 15:34   DG Chest Port 1 View  Result Date: 09/23/2019 CLINICAL DATA:  Left effusion status post thoracentesis EXAM: PORTABLE CHEST 1 VIEW COMPARISON:  09/19/2019 FINDINGS: Improvement in the left effusion following thoracentesis. No pneumothorax. Small pleural effusions remain bilaterally. Slight improvement in the left lung aeration but persistent left basilar collapse/consolidation remains. Stable heart size and vascularity. Aorta atherosclerotic. NG tube enters the stomach with the tip not visualized. Degenerative changes of the spine and shoulders. Postop changes in the left breast and axilla. IMPRESSION: Improvement in the left effusion following thoracentesis. No pneumothorax. Electronically Signed   By: Jerilynn Mages.  Shick M.D.   On: 09/23/2019 13:12   US THORACENTESIS ASP PLEURAL SPACE W/IMG GUIDE  Result Date: 09/23/2019 INDICATION: Pleural effusion, severe pneumonia EXAM: ULTRASOUND GUIDED LEFT THORACENTESIS MEDICATIONS: 1% lidocaine local COMPLICATIONS: None immediate. PROCEDURE: An ultrasound guided thoracentesis was thoroughly discussed with the patient's husband and questions answered. The benefits, risks, alternatives and complications were also discussed. The patient understands and wishes to proceed with the procedure. Written consent was obtained. Ultrasound was performed to localize and mark an adequate pocket of fluid  in the left chest. The area was then prepped and draped in the normal sterile fashion. 1% Lidocaine was used for local anesthesia. Under ultrasound guidance a 6 Fr Safe-T-Centesis catheter was introduced. Thoracentesis was performed. The catheter was removed and a dressing applied. FINDINGS: A total of approximately 820 cc of yellow serosanguineous pleural fluid was removed. Samples were sent to the laboratory as requested by the clinical team. IMPRESSION: Successful ultrasound guided left thoracentesis yielding 820 cc of pleural fluid. Electronically Signed   By: Jerilynn Mages.  Shick M.D.   On: 09/23/2019 12:47    Scheduled Meds: . Chlorhexidine Gluconate Cloth  6 each Topical Daily  . collagenase   Topical Daily  . feeding supplement (OSMOLITE 1.5 CAL)  1,000 mL Per Tube Q24H  . feeding supplement (PRO-STAT SUGAR FREE 64)  30 mL Per Tube BID  . free water  100 mL Per Tube Q6H  . Gerhardt's butt cream  Topical BID  . heparin  5,000 Units Subcutaneous Q8H  . insulin aspart  0-9 Units Subcutaneous Q4H  . lidocaine      . mouth rinse  15 mL Mouth Rinse BID   Continuous Infusions: . cefTRIAXone (ROCEPHIN)  IV Stopped (09/22/19 2137)  . lactated ringers 75 mL/hr at 09/23/19 1400     LOS: 7 days   Marylu Lund, MD [facility] Pager On Amion  If 7PM-7AM, please contact night-coverage 09/23/2019, 3:49 PM

## 2019-09-23 NOTE — Progress Notes (Signed)
Bipap not indicated at this time.  Will continue to monitor for changes.

## 2019-09-23 NOTE — Progress Notes (Signed)
Pt. Nasally suctioned for copious amounts of thick yellow/pink tinged secretions.  Pt. With run of SVT following suctioning.  RN aware.

## 2019-09-23 NOTE — Procedures (Signed)
Left pleural eff  S/p [place] left thora  820cc removed Labs sent No comp Stable CXR pending

## 2019-09-24 ENCOUNTER — Inpatient Hospital Stay (HOSPITAL_COMMUNITY): Payer: Medicare Other

## 2019-09-24 DIAGNOSIS — I361 Nonrheumatic tricuspid (valve) insufficiency: Secondary | ICD-10-CM

## 2019-09-24 DIAGNOSIS — E43 Unspecified severe protein-calorie malnutrition: Secondary | ICD-10-CM | POA: Insufficient documentation

## 2019-09-24 LAB — COMPREHENSIVE METABOLIC PANEL
ALT: 51 U/L — ABNORMAL HIGH (ref 0–44)
AST: 54 U/L — ABNORMAL HIGH (ref 15–41)
Albumin: 2.4 g/dL — ABNORMAL LOW (ref 3.5–5.0)
Alkaline Phosphatase: 114 U/L (ref 38–126)
Anion gap: 8 (ref 5–15)
BUN: 32 mg/dL — ABNORMAL HIGH (ref 8–23)
CO2: 26 mmol/L (ref 22–32)
Calcium: 7.9 mg/dL — ABNORMAL LOW (ref 8.9–10.3)
Chloride: 106 mmol/L (ref 98–111)
Creatinine, Ser: 0.42 mg/dL — ABNORMAL LOW (ref 0.44–1.00)
GFR calc Af Amer: >60 mL/min (ref >60)
GFR calc non Af Amer: >60 mL/min (ref >60)
Glucose, Bld: 155 mg/dL — ABNORMAL HIGH (ref 70–99)
Potassium: 4.3 mmol/L (ref 3.5–5.1)
Sodium: 140 mmol/L (ref 135–145)
Total Bilirubin: 0.6 mg/dL (ref 0.3–1.2)
Total Protein: 4.7 g/dL — ABNORMAL LOW (ref 6.5–8.1)

## 2019-09-24 LAB — CBC
HCT: 28.6 % — ABNORMAL LOW (ref 36.0–46.0)
Hemoglobin: 8.7 g/dL — ABNORMAL LOW (ref 12.0–15.0)
MCH: 29.9 pg (ref 26.0–34.0)
MCHC: 30.4 g/dL (ref 30.0–36.0)
MCV: 98.3 fL (ref 80.0–100.0)
Platelets: 149 10*3/uL — ABNORMAL LOW (ref 150–400)
RBC: 2.91 MIL/uL — ABNORMAL LOW (ref 3.87–5.11)
RDW: 14.2 % (ref 11.5–15.5)
WBC: 11.8 10*3/uL — ABNORMAL HIGH (ref 4.0–10.5)
nRBC: 0.2 % (ref 0.0–0.2)

## 2019-09-24 LAB — HEPATIC FUNCTION PANEL
ALT: 47 U/L — ABNORMAL HIGH (ref 0–44)
AST: 47 U/L — ABNORMAL HIGH (ref 15–41)
Albumin: 2.6 g/dL — ABNORMAL LOW (ref 3.5–5.0)
Alkaline Phosphatase: 104 U/L (ref 38–126)
Bilirubin, Direct: 0.1 mg/dL (ref 0.0–0.2)
Indirect Bilirubin: 0.4 mg/dL (ref 0.3–0.9)
Total Bilirubin: 0.5 mg/dL (ref 0.3–1.2)
Total Protein: 4.8 g/dL — ABNORMAL LOW (ref 6.5–8.1)

## 2019-09-24 LAB — BLOOD GAS, ARTERIAL
Acid-Base Excess: 0.9 mmol/L (ref 0.0–2.0)
Acid-Base Excess: 3.9 mmol/L — ABNORMAL HIGH (ref 0.0–2.0)
Bicarbonate: 28.4 mmol/L — ABNORMAL HIGH (ref 20.0–28.0)
Bicarbonate: 26.8 mmol/L (ref 20.0–28.0)
Drawn by: 257701
FIO2: 100
FIO2: 100
MECHVT: 510 mL
O2 Saturation: 97.8 %
O2 Saturation: 99.1 %
PEEP: 5 cmH2O
Patient temperature: 98.7
Patient temperature: 98.6
RATE: 14 resp/min
pCO2 arterial: 66.2 mmHg (ref 32.0–48.0)
pCO2 arterial: 34.1 mmHg (ref 32.0–48.0)
pH, Arterial: 7.256 — ABNORMAL LOW (ref 7.350–7.450)
pH, Arterial: 7.508 — ABNORMAL HIGH (ref 7.350–7.450)
pO2, Arterial: 149 mmHg — ABNORMAL HIGH (ref 83.0–108.0)
pO2, Arterial: 195 mmHg — ABNORMAL HIGH (ref 83.0–108.0)

## 2019-09-24 LAB — ECHOCARDIOGRAM COMPLETE
Height: 68 in
Weight: 2116.42 oz

## 2019-09-24 LAB — GLUCOSE, CAPILLARY
Glucose-Capillary: 147 mg/dL — ABNORMAL HIGH (ref 70–99)
Glucose-Capillary: 138 mg/dL — ABNORMAL HIGH (ref 70–99)
Glucose-Capillary: 133 mg/dL — ABNORMAL HIGH (ref 70–99)
Glucose-Capillary: 146 mg/dL — ABNORMAL HIGH (ref 70–99)
Glucose-Capillary: 158 mg/dL — ABNORMAL HIGH (ref 70–99)
Glucose-Capillary: 153 mg/dL — ABNORMAL HIGH (ref 70–99)

## 2019-09-24 LAB — TROPONIN I (HIGH SENSITIVITY): Troponin I (High Sensitivity): 50 ng/L — ABNORMAL HIGH (ref <18)

## 2019-09-24 LAB — GRAM STAIN: Gram Stain: NONE SEEN

## 2019-09-24 LAB — AMMONIA: Ammonia: 30 umol/L (ref 9–35)

## 2019-09-24 MED ORDER — LORAZEPAM 2 MG/ML IJ SOLN: 0.5000 mg | INTRAMUSCULAR | Status: DC | PRN

## 2019-09-24 MED ORDER — MEMANTINE HCL 5 MG PO TABS
10.0000 mg | ORAL_TABLET | Freq: Two times a day (BID) | ORAL | Status: DC
  Administered 2019-09-24 – 2019-10-16 (×37): 10 mg
  Filled 2019-09-24 (×13): qty 2
  Filled 2019-09-24: qty 1
  Filled 2019-09-24 (×2): qty 2
  Filled 2019-09-24 (×2): qty 1
  Filled 2019-09-24: qty 2
  Filled 2019-09-24: qty 1
  Filled 2019-09-24 (×2): qty 2
  Filled 2019-09-24: qty 1
  Filled 2019-09-24 (×14): qty 2

## 2019-09-24 MED ORDER — MIDODRINE HCL 5 MG PO TABS
10.0000 mg | ORAL_TABLET | Freq: Once | ORAL | Status: AC
  Administered 2019-09-24: 10 mg via ORAL
  Filled 2019-09-24: qty 2

## 2019-09-24 MED ORDER — FUROSEMIDE 10 MG/ML IJ SOLN: 40.0000 mg | Freq: Once | INTRAMUSCULAR | Status: DC

## 2019-09-24 MED ORDER — FENTANYL CITRATE (PF) 100 MCG/2ML IJ SOLN: 25.0000 ug | INTRAMUSCULAR | Status: DC | PRN

## 2019-09-24 MED ORDER — FENTANYL CITRATE (PF) 100 MCG/2ML IJ SOLN
INTRAMUSCULAR | Status: AC
  Administered 2019-09-24: 50 ug
  Filled 2019-09-24: qty 2

## 2019-09-24 MED ORDER — ALPRAZOLAM 1 MG PO TABS: 1.0000 mg | ORAL_TABLET | Freq: Every evening | ORAL | Status: DC | PRN

## 2019-09-24 MED ORDER — HYDROCORTISONE NA SUCCINATE PF 100 MG IJ SOLR
100.0000 mg | INTRAMUSCULAR | Status: AC
  Administered 2019-09-24: 100 mg via INTRAVENOUS
  Filled 2019-09-24: qty 2

## 2019-09-24 MED ORDER — MIDODRINE HCL 5 MG PO TABS
10.0000 mg | ORAL_TABLET | Freq: Three times a day (TID) | ORAL | Status: DC
  Administered 2019-09-24 – 2019-09-27 (×9): 10 mg
  Filled 2019-09-24 (×10): qty 2

## 2019-09-24 MED ORDER — SODIUM CHLORIDE 0.9 % IV SOLN: Freq: Once | INTRAVENOUS | Status: AC

## 2019-09-24 MED ORDER — DONEPEZIL HCL 10 MG PO TABS: 10.0000 mg | ORAL_TABLET | Freq: Every day | ORAL | Status: DC

## 2019-09-24 MED ORDER — MIDODRINE HCL 5 MG PO TABS: 10.0000 mg | ORAL_TABLET | Freq: Three times a day (TID) | ORAL | Status: DC

## 2019-09-24 MED ORDER — MIDAZOLAM HCL 2 MG/2ML IJ SOLN
INTRAMUSCULAR | Status: AC
  Administered 2019-09-24: 1 mg
  Filled 2019-09-24: qty 2

## 2019-09-24 MED ORDER — LACTATED RINGERS IV SOLN: INTRAVENOUS | Status: DC

## 2019-09-24 MED ORDER — ALBUMIN HUMAN 25 % IV SOLN
25.0000 g | Freq: Once | INTRAVENOUS | Status: AC
  Administered 2019-09-24: 25 g via INTRAVENOUS
  Filled 2019-09-24: qty 100

## 2019-09-24 MED ORDER — PERFLUTREN LIPID MICROSPHERE
1.0000 mL | INTRAVENOUS | Status: AC | PRN
  Administered 2019-09-24: 4 mL via INTRAVENOUS
  Filled 2019-09-24: qty 10

## 2019-09-24 MED ORDER — ORAL CARE MOUTH RINSE
15.0000 mL | OROMUCOSAL | Status: DC
  Administered 2019-09-24 – 2019-10-06 (×106): 15 mL via OROMUCOSAL

## 2019-09-24 MED ORDER — DOCUSATE SODIUM 50 MG/5ML PO LIQD
100.0000 mg | Freq: Two times a day (BID) | ORAL | Status: DC | PRN
  Filled 2019-09-24: qty 10

## 2019-09-24 MED ORDER — CHLORHEXIDINE GLUCONATE 0.12% ORAL RINSE (MEDLINE KIT)
15.0000 mL | Freq: Two times a day (BID) | OROMUCOSAL | Status: DC
  Administered 2019-09-24 – 2019-10-06 (×24): 15 mL via OROMUCOSAL

## 2019-09-24 MED ORDER — FENTANYL CITRATE (PF) 100 MCG/2ML IJ SOLN
25.0000 ug | INTRAMUSCULAR | Status: DC | PRN
  Administered 2019-09-29: 25 ug via INTRAVENOUS
  Administered 2019-09-30 (×2): 100 ug via INTRAVENOUS
  Administered 2019-09-30: 50 ug via INTRAVENOUS
  Filled 2019-09-24 (×4): qty 2

## 2019-09-24 MED ORDER — DEXMEDETOMIDINE HCL IN NACL 200 MCG/50ML IV SOLN
0.0000 ug/kg/h | INTRAVENOUS | Status: DC
  Administered 2019-09-24: 0.4 ug/kg/h via INTRAVENOUS
  Filled 2019-09-24: qty 50

## 2019-09-24 MED ORDER — HYDROCORTISONE NA SUCCINATE PF 100 MG IJ SOLR: 50.0000 mg | INTRAMUSCULAR | Status: DC

## 2019-09-24 MED ORDER — ALPRAZOLAM 1 MG PO TABS
1.0000 mg | ORAL_TABLET | Freq: Every evening | ORAL | Status: DC | PRN
  Filled 2019-09-24: qty 1

## 2019-09-24 MED ORDER — QUETIAPINE FUMARATE 100 MG PO TABS
100.0000 mg | ORAL_TABLET | Freq: Every day | ORAL | Status: DC
  Administered 2019-09-24 – 2019-09-28 (×7): 100 mg
  Filled 2019-09-24 (×6): qty 1

## 2019-09-24 MED ORDER — DONEPEZIL HCL 10 MG PO TABS
10.0000 mg | ORAL_TABLET | Freq: Every day | ORAL | Status: DC
  Administered 2019-09-24 – 2019-10-20 (×22): 10 mg
  Filled 2019-09-24 (×23): qty 1

## 2019-09-24 MED ORDER — QUETIAPINE FUMARATE 100 MG PO TABS: 100.0000 mg | ORAL_TABLET | Freq: Every day | ORAL | Status: DC

## 2019-09-24 MED ORDER — FUROSEMIDE 10 MG/ML IJ SOLN
60.0000 mg | Freq: Once | INTRAMUSCULAR | Status: DC
  Filled 2019-09-24: qty 6

## 2019-09-24 NOTE — Progress Notes (Signed)
[facility] notified of another drop in bp, as low as 79/34 MAP of 48. MD ordered another 500 mL bolus. MD states levophed might need to be started at some point. RN will continue to monitor.

## 2019-09-24 NOTE — Progress Notes (Signed)
  Echocardiogram 2D Echocardiogram has been performed.  Burnett Kanaris 09/24/2019, 2:49 PM

## 2019-09-24 NOTE — Progress Notes (Signed)
Discontinue Lasix  LR at 50cc/hr

## 2019-09-24 NOTE — Progress Notes (Signed)
Pt hypotensive, bp dropped as low as 74/45 (48). [facility] MD paged. MD ordered 500 mL bolus & midodrine 10 mg. Bolus started & meds given. Will continue to monitor.

## 2019-09-24 NOTE — Progress Notes (Addendum)
NAME:  Barbara Flowers, MRN:  [id number], DOB:  Oct 23, 1950, LOS: 8 ADMISSION DATE:  09/16/2019, CONSULTATION DATE:  12/26  REFERRING MD:  Earlie Counts, CHIEF COMPLAINT: Shortness of breath, altered mental status  Brief History   50 yowf never smoker with h/o breast ca s/p bone marrow transplant early 1990s in [place] and cancer free s recent rx but problems with sundowning/ dementia and w/c bound x 3 years p cva acutely worse x one week PTA with cough/ congestion rx over the phone by PCP since 12/21 and gradually worse x 2 days PTA with poor po intake, ams and weakness with mostly mucoid sputum > to ER pm 12/26 with extensive L infiltrate and L effusion with hypoxemia corrected on non rebreathing and PCCM service asked to admit. Signed off to the hospitalist service Was asked to see her again today for altered mental status, hypoxemia, shortness of breath Post thoracentesis for about 820 cc of fluid  Past Medical History   Past Medical History:  Diagnosis Date  . Anemia   . Anemia   . Anxiety disorder   . Blood transfusion without reported diagnosis   . Brain cancer [facility])    s/p sterotactic radio surgery  . Brain tumor ([place])   . Breast cancer ([place])   . Cancer ([place]) 1990   L breast  . H/O bone marrow transplant ([place])   . Hypothyroidism   . IBS (irritable bowel syndrome)   . MDD (major depressive disorder)   . Memory loss   . Obesity   . Orthostatic hypotension   . Seizure ([place])   . Seizures ([place])   . Stroke [facility]) 2005   hemorrhagic  . Stroke ([place])   . Subdural hematoma ([place])   . Upper brachial plexus paralysis syndrome   . Urinary incontinence    Significant Hospital Events   Worsening mental status Requiring nonrebreather mask at present   Consults:  PCCM  Procedures:    Significant Diagnostic Tests:  Blood culture 12/28 bedside thoracentesis-200 cc 1/2 thoracentesis-820 cc of fluid drained  Micro Data:  Blood culture with Proteus mirabilis 12/26  Antimicrobials:    Rocephin day 9 Azithromycin 12/27-28 Vancomycin 12/26 Interim history/subjective:  Increased work of breathing Encephalopathic  Objective   Blood pressure (!) 90/38, pulse 88, temperature (!) 97.3 F (36.3 C), temperature source Axillary, resp. rate (!) 29, height 5\' 8"  (1.727 m), weight 60 kg, SpO2 93 %.        Intake/Output Summary (Last 24 hours) at 09/24/2019 1009 Last data filed at 09/24/2019 0600 Gross per 24 hour  Intake 3528.1 ml  Output 925 ml  Net 2603.1 ml   Filed Weights   09/22/19 0500 09/23/19 0355 09/24/19 0500  Weight: 59.9 kg 59.9 kg 60 kg    Examination: General: Elderly, chronically ill-appearing HENT: Dry oral mucosa Lungs: Bilateral rhonchi Cardiovascular: S1-S2 appreciated Abdomen: Soft, bowel sounds appreciated Extremities: No clubbing Neuro: Encephalopathic GU:   Chest x-ray 09/24/2019-bilateral pleural effusions, possible left lower lobe infiltrate-reviewed by myself  Resolved Hospital Problem list     Assessment & Plan:  Acute hypoxemic respiratory failure  -Continue oxygen supplementation -Suction as needed  Pneumonia -On Rocephin -Leukocytosis improving  Proteus bacteremia -Day 9 of Rocephin   Bilateral pleural effusion -Status post thoracentesis -Transudative effusion  metabolic encephalopathy -LFTs did bump up yesterday -Trend LFTs -Obtain ammonia level  Sacral decubitus wound -Wound care  Malnutrition  History of dementia -On Namenda, Aricept  Obtain ABG Follow LFTs May require intubation-spouse updated  Best practice:  Diet: Tube feeding Pain/Anxiety/Delirium protocol (if indicated): Xanax VAP protocol (if indicated): Not indicated DVT prophylaxis: Subcu heparin Mobility: Bedrest Code Status: Full code Family Communication: Updated spouse Disposition: icu  Labs   CBC: Recent Labs  Lab 09/19/19 0235 09/21/19 0234 09/22/19 0220 09/23/19 0144 09/24/19 0305  WBC 18.2* 18.7* 16.5* 14.6* 11.8*  HGB  10.3* 10.3* 10.3* 8.5* 8.7*  HCT 33.1* 34.0* 33.9* 27.4* 28.6*  MCV 95.9 98.0 100.3* 96.5 98.3  PLT 182 161 141* 133* 149*    Basic Metabolic Panel: Recent Labs  Lab 09/19/19 0235 09/19/19 1318 09/19/19 1800 09/20/19 0906 09/20/19 1648 09/21/19 0234 09/22/19 0220 09/23/19 0144 09/24/19 0305  NA 144  --   --   --   --  143 144 141 140  K 4.0  --   --   --   --  3.9 4.1 4.1 4.3  CL 107  --   --   --   --  108 108 107 106  CO2 24  --   --   --   --  24 26 25 26   GLUCOSE 93  --   --   --   --  147* 133* 124* 155*  BUN 24*  --   --   --   --  30* 27* 30* 32*  CREATININE 0.57  --   --   --   --  0.50 0.47 0.49 0.42*  CALCIUM 8.5*  --   --   --   --  8.0* 8.4* 7.8* 7.9*  MG  --  1.8 1.8 1.8 1.7 1.7  --   --   --   PHOS  --  2.0* 2.0* 1.6* 1.7*  --   --   --   --    GFR: Estimated Creatinine Clearance: 62.9 mL/min (A) (by C-G formula based on SCr of 0.42 mg/dL (L)). Recent Labs  Lab 09/18/19 0259 09/19/19 0509 09/21/19 0234 09/22/19 0220 09/23/19 0144 09/24/19 0305  PROCALCITON 0.60 0.40  --   --   --   --   WBC  --   --  18.7* 16.5* 14.6* 11.8*    Liver Function Tests: Recent Labs  Lab 09/19/19 0235 09/21/19 0234 09/22/19 0220 09/23/19 0144 09/24/19 0305  AST 80* 50* 41 199* 54*  ALT 37 38 33 76* 51*  ALKPHOS 241* 137* 102 163* 114  BILITOT 1.0 0.7 0.8 0.5 0.6  PROT 5.5* 5.1* 5.9* 5.0* 4.7*  ALBUMIN 3.1* 2.5* 3.4* 2.9* 2.4*   No results for input(s): LIPASE, AMYLASE in the last 168 hours. No results for input(s): AMMONIA in the last 168 hours.  ABG    Component Value Date/Time   PHART 7.501 (H) 09/18/2019 0548   PCO2ART 30.4 (L) 09/18/2019 0548   PO2ART 147 (H) 09/18/2019 0548   HCO3 23.6 09/18/2019 0548   TCO2 24 08/31/2015 1142   ACIDBASEDEF 0.8 09/16/2019 1622   O2SAT 99.0 09/18/2019 0548     Coagulation Profile: No results for input(s): INR, PROTIME in the last 168 hours.  Cardiac Enzymes: No results for input(s): CKTOTAL, CKMB, CKMBINDEX,  TROPONINI in the last 168 hours.  HbA1C: Hgb A1c MFr Bld  Date/Time Value Ref Range Status  09/18/2019 01:30 PM 5.6 4.8 - 5.6 % Final    Comment:    (NOTE) Pre diabetes:          5.7%-6.4% Diabetes:              >6.4%  Glycemic control for   <7.0% adults with diabetes   09/01/2015 04:28 AM 5.7 (H) 4.8 - 5.6 % Final    Comment:    (NOTE)         Pre-diabetes: 5.7 - 6.4         Diabetes: >6.4         Glycemic control for adults with diabetes: <7.0     CBG: Recent Labs  Lab 09/23/19 1548 09/23/19 1918 09/23/19 2354 09/24/19 0305 09/24/19 0742  GLUCAP 124* 132* 117* 147* 138*    Review of Systems:   Altered mentation, unable to answer question  Past Medical History  She,  has a past medical history of Anemia, Anemia, Anxiety disorder, Blood transfusion without reported diagnosis, Brain cancer ([place]), Brain tumor ([place]), Breast cancer ([place]), Cancer ([place]) (1990), H/O bone marrow transplant ([facility]), Hypothyroidism, IBS (irritable bowel syndrome), MDD (major depressive disorder), Memory loss, Obesity, Orthostatic hypotension, Seizure ([place]), Seizures ([place]), Stroke ([place]) (2005), Stroke [facility]), Subdural hematoma ([place]), Upper brachial plexus paralysis syndrome, and Urinary incontinence.   Surgical History    Past Surgical History:  Procedure Laterality Date  . BREAST LUMPECTOMY Left    post chemotherapy and radiation  . CHOLECYSTECTOMY    . CRANIOTOMY FOR HEMISPHERECTOMY TOTAL / PARTIAL Right   . GASTRIC BYPASS  1985  . HAND SURGERY Left 2015  . HAND TENDON SURGERY Left    for brachial plexus injury  . LYMPH NODE BIOPSY       Social History   reports that she has never smoked. She has never used smokeless tobacco. She reports current alcohol use. She reports that she does not use drugs.   Family History   Her family history includes Heart disease in her father; Hypertension in her father and mother; Stroke in her mother.   Allergies Allergies  Allergen Reactions  .  Gabapentin Other (See Comments)    seizure  . Tramadol Other (See Comments)    Brings on Seizures      The patient is critically ill with multiple organ systems failure and requires high complexity decision making for assessment and support, frequent evaluation and titration of therapies, application of advanced monitoring technologies and extensive interpretation of multiple databases. Critical Care Time devoted to patient care services described in this note independent of APP/resident time (if applicable)  is 32 minutes.   Sherrilyn Rist MD [facility] Personal pager: [phone] If unanswered, please page CCM On-call: [phone]

## 2019-09-24 NOTE — Progress Notes (Signed)
Notified Lab that ABG being sent for analysis. This ABG was drawn on 100% NRB.

## 2019-09-24 NOTE — Progress Notes (Signed)
Patient continues to struggle with heart rate in the [id number], systolic blood pressure in the 70s Altered mentation ABG as noted with hypercarbic/hypoxemic respiratory failure  Decision was made to intubate patient  During intubation noted to have a lot of secretions in the oropharynx Coughing up mucoid secretions through the vocal cords prior to intubation  Suction for thick mucoid secretions following intubation  Tolerated intubation well  Heart rate did improve, blood pressure is stable  Chest x-ray pending

## 2019-09-24 NOTE — Progress Notes (Signed)
Nasal suctioned for moderate amounts of thick tan secretions.

## 2019-09-24 NOTE — Progress Notes (Signed)
PT Cancellation Note  Patient Details Name: Barbara Flowers MRN: [id number] DOB: 04/21/1951   Cancelled Treatment:    Reason Eval/Treat Not Completed: Medical issues which prohibited therapy--pt now intubated. Will sign off. Please reorder, if appropriate, when medically ready. Thanks.    Doreatha Massed, PT Acute Rehabilitation

## 2019-09-24 NOTE — Procedures (Signed)
Intubation Procedure Note Barbara Alsman [id number] [birth year]  Procedure: Intubation Indications: Respiratory insufficiency   Received 50 mcg of fentanyl, 1 mg of Versed for sedation  Procedure Details Consent: Risks of procedure as well as the alternatives and risks of each were explained to the (patient/caregiver).  Consent for procedure obtained.-Discussed with spouse Time Out: Verified patient identification, verified procedure, site/side was marked, verified correct patient position, special equipment/implants available, medications/allergies/relevent history reviewed, required imaging and test results available.  Performed  Maximum sterile technique was used including cap, gloves, hand hygiene and mask.  3 Size 7.5 endotracheal tube placed uneventfully   Evaluation Hemodynamic Status: BP stable throughout; O2 sats: stable throughout Patient's Current Condition: stable Complications: No apparent complications Patient did tolerate procedure well. Chest X-ray ordered to verify placement.  CXR: pending.   Barbara Flowers A Sabir Charters 09/24/2019

## 2019-09-24 NOTE — Progress Notes (Signed)
Pt HR went into SVT, HR got up to the 180s. Labetalol 5mg  given. [facility] MD paged. Bolus ordered. Amnio gtt not started due to pt MAP in the 40s. Will continue to monitor.

## 2019-09-24 NOTE — Progress Notes (Signed)
Paged Dr Earlie Counts to notify him of pts declining respiratory status. Pt is audibly congested even after suctioning and using accessory muscles to breathe. Pt is on NRB

## 2019-09-24 NOTE — Progress Notes (Signed)
PROGRESS NOTE    Barbara Flowers  [id number] DOB: [birth year] DOA: 09/16/2019 PCP: [name], NP    Brief NarrativeBP:[id number] with hx breast cancer, dementia, hx prior CVA presented on 12/26 with extensive L sided infiltrate and effusion with hypoxemia, requiring nonrebreather. Pt was initially admitted to [place]:   Active Problems:   Dementia ([place])   CAP (community acquired pneumonia)   Pleural effusion on left   Acute prerenal azotemia   Hypernatremia   Pressure injury of skin   Malnutrition of moderate degree  1. Acute hypoxemic respiratory failure 1. Presented initially NRB dependent, now weaned to room air 2. Pt s/p thoracentesis on 12/28 yielding 200cc fluid 3. Had earlier discussed with PCCM. Recommendation for cont abx, currently on rocephin 4. Patient had remained with tachypnea to the mid to upper 20's and increased O2 requirements  5. CT chest reviewed, findings of large L sided effusion. Pt is now s/p L thoracentesis yielding 820cc 6. Overnight respiratory status continued to decline. This AM, pt noted to have increased respiratory effort, altered mentation.  7. D/c'd IVF, ordered trial of IV hydrocortisone 100mg  with 25g albumin and lasix if BP tolerates 8. Had discussed case with PCCM who has since seen pt. Chart reviewed. Pt now intubated and on ventilator support 2. Tachycardia  1. Intermittent but new findings noted as of 12/20 2. Continue to correct lytes as needed 3. Pt had been continued on PRN labetalol with hold parameters 4. Pt had been continued on IVF hydration, now held given worsening respiratory status 3. Pneumococcal PNA with pleural effusion 1. Per above, pt is continued on rocephin and is s/p thoracentesis yielding 200cc fluid and again on 1/2 yielding 820cc 2. Continued on rocephin as tolerated 4. Proteus bacteremia 1. Blood cx from 12/26 with pan-sensitive proteus species 2. Pt is continued on rocephin as pt  tolerates 3. Labs reviewed, WBC of 11.8k 5. Acute toxic metabolic encephalopathy 1. Seems more encephalopathic this AM 6. Hypernatremia 1. Resolved as of 12/29 2. Repeat bmet in AM 7. Hypokalemia 1. Normalized as of 12/29 8. Metabolic acidosis 1. Resolved as of 12/29 and will repeat bmet in AM 9. Unstageable sacral decub ulcer POA 1. Seen by [facility] 10. Moderate protein calorie malnutrition 1. Nutrition following. Continued on tube feeding as tolerated  DVT prophylaxis: heparin subq Code Status: Full Family Communication: Pt in room, updated husband over phone Disposition Plan: Uncertain at this time  Consultants:   Critical Care  Procedures:   Bedside thoracentesis 12/28  Antimicrobials: Anti-infectives (From admission, onward)   Start     Dose/Rate Route Frequency Ordered Stop   09/17/19 1000  azithromycin (ZITHROMAX) 500 mg in sodium chloride 0.9 % 250 mL IVPB  Status:  Discontinued     500 mg 250 mL/hr over 60 Minutes Intravenous Every 24 hours 09/17/19 0920 09/18/19 1157   09/16/19 2200  cefTRIAXone (ROCEPHIN) 2 g in sodium chloride 0.9 % 100 mL IVPB     2 g 200 mL/hr over 30 Minutes Intravenous Every 24 hours 09/16/19 1745     09/16/19 1745  cefTRIAXone (ROCEPHIN) injection 1 g  Status:  Discontinued     1 g Intramuscular Every 24 hours 09/16/19 1740 09/16/19 1743   09/16/19 1515  ceFEPIme (MAXIPIME) 2 g in sodium chloride 0.9 % 100 mL IVPB     2 g 200 mL/hr over 30 Minutes Intravenous  Once 09/16/19 1513 09/16/19 1834   09/16/19 1515  metroNIDAZOLE (FLAGYL) IVPB  500 mg  Status:  Discontinued     500 mg 100 mL/hr over 60 Minutes Intravenous  Once 09/16/19 1513 09/16/19 1741   09/16/19 1515  vancomycin (VANCOCIN) IVPB 1000 mg/200 mL premix  Status:  Discontinued     1,000 mg 200 mL/hr over 60 Minutes Intravenous  Once 09/16/19 1513 09/16/19 1834      Subjective: Unable to assess given encephalopathy  Objective: Vitals:   09/24/19 0552 09/24/19 0600 09/24/19  0811 09/24/19 1124  BP: (!) 90/42 (!) 90/38    Pulse: 81 88    Resp: (!) 23 (!) 29    Temp:   (!) 97.3 F (36.3 C)   TempSrc:   Axillary   SpO2: 95% 93%  99%  Weight:      Height:    5\' 8"  (1.727 m)    Intake/Output Summary (Last 24 hours) at 09/24/2019 1141 Last data filed at 09/24/2019 0600 Gross per 24 hour  Intake 3528.1 ml  Output 925 ml  Net 2603.1 ml   Filed Weights   09/22/19 0500 09/23/19 0355 09/24/19 0500  Weight: 59.9 kg 59.9 kg 60 kg    Examination: General exam: poorly responsive, increased resp effort Respiratory system: normal chest rise, no audible wheezing Cardiovascular system: tachycardia, s1-s2 Gastrointestinal system: Nondistended, nontender, pos BS Central nervous system: No seizures, no tremors Extremities: No cyanosis, no joint deformities Skin: No rashes, no pallor Psychiatry: Unable to assess given mentation  Data Reviewed: I have personally reviewed following labs and imaging studies  CBC: Recent Labs  Lab 09/19/19 0235 09/21/19 0234 09/22/19 0220 09/23/19 0144 09/24/19 0305  WBC 18.2* 18.7* 16.5* 14.6* 11.8*  HGB 10.3* 10.3* 10.3* 8.5* 8.7*  HCT 33.1* 34.0* 33.9* 27.4* 28.6*  MCV 95.9 98.0 100.3* 96.5 98.3  PLT 182 161 141* 133* 123456*   Basic Metabolic Panel: Recent Labs  Lab 09/19/19 0235 09/19/19 1318 09/19/19 1800 09/20/19 0906 09/20/19 1648 09/21/19 0234 09/22/19 0220 09/23/19 0144 09/24/19 0305  NA 144  --   --   --   --  143 144 141 140  K 4.0  --   --   --   --  3.9 4.1 4.1 4.3  CL 107  --   --   --   --  108 108 107 106  CO2 24  --   --   --   --  24 26 25 26   GLUCOSE 93  --   --   --   --  147* 133* 124* 155*  BUN 24*  --   --   --   --  30* 27* 30* 32*  CREATININE 0.57  --   --   --   --  0.50 0.47 0.49 0.42*  CALCIUM 8.5*  --   --   --   --  8.0* 8.4* 7.8* 7.9*  MG  --  1.8 1.8 1.8 1.7 1.7  --   --   --   PHOS  --  2.0* 2.0* 1.6* 1.7*  --   --   --   --    GFR: Estimated Creatinine Clearance: 62.9 mL/min (A)  (by C-G formula based on SCr of 0.42 mg/dL (L)). Liver Function Tests: Recent Labs  Lab 09/21/19 0234 09/22/19 0220 09/23/19 0144 09/24/19 0305 09/24/19 1034  AST 50* 41 199* 54* 47*  ALT 38 33 76* 51* 47*  ALKPHOS 137* 102 163* 114 104  BILITOT 0.7 0.8 0.5 0.6 0.5  PROT 5.1* 5.9* 5.0*  4.7* 4.8*  ALBUMIN 2.5* 3.4* 2.9* 2.4* 2.6*   No results for input(s): LIPASE, AMYLASE in the last 168 hours. Recent Labs  Lab 09/24/19 1034  AMMONIA 30   Coagulation Profile: No results for input(s): INR, PROTIME in the last 168 hours. Cardiac Enzymes: No results for input(s): CKTOTAL, CKMB, CKMBINDEX, TROPONINI in the last 168 hours. BNP (last 3 results) No results for input(s): PROBNP in the last 8760 hours. HbA1C: No results for input(s): HGBA1C in the last 72 hours. CBG: Recent Labs  Lab 09/23/19 1548 09/23/19 1918 09/23/19 2354 09/24/19 0305 09/24/19 0742  GLUCAP 124* 132* 117* 147* 138*   Lipid Profile: No results for input(s): CHOL, HDL, LDLCALC, TRIG, CHOLHDL, LDLDIRECT in the last 72 hours. Thyroid Function Tests: No results for input(s): TSH, T4TOTAL, FREET4, T3FREE, THYROIDAB in the last 72 hours. Anemia Panel: No results for input(s): VITAMINB12, FOLATE, FERRITIN, TIBC, IRON, RETICCTPCT in the last 72 hours. Sepsis Labs: Recent Labs  Lab 09/18/19 0259 09/19/19 0509  PROCALCITON 0.60 0.40    Recent Results (from the past 240 hour(s))  Blood Culture (routine x 2)     Status: None   Collection Time: 09/16/19  4:00 PM   Specimen: BLOOD  Result Value Ref Range Status   Specimen Description   Final    BLOOD RIGHT ANTECUBITAL Performed at [facility] [address] [place], [place] [address]    Special Requests   Final    BOTTLES DRAWN AEROBIC AND ANAEROBIC Blood Culture results may not be optimal due to an inadequate volume of blood received in culture bottles Performed at [facility] [address] [place], [place]  [address]    Culture   Final    NO GROWTH 5 DAYS Performed at [facility], [place] [address], [place], [place] [address]    Report Status 09/21/2019 FINAL  Final  Blood Culture (routine x 2)     Status: Abnormal   Collection Time: 09/16/19  4:01 PM   Specimen: BLOOD RIGHT FOREARM  Result Value Ref Range Status   Specimen Description   Final    BLOOD RIGHT FOREARM Performed at [facility] [address], [place], [place] [address]    Special Requests   Final    BOTTLES DRAWN AEROBIC AND ANAEROBIC Blood Culture adequate volume Performed at [facility] [address], [place], [place] [address]    Culture  Setup Time   Final    Lonell Grandchild NEGATIVE RODS GRAM POSITIVE COCCI AEROBIC BOTTLE ONLY CRITICAL RESULT CALLED TO, READ BACK BY AND VERIFIED WITH: PHARMD D WOFFARD [id number] AT 1440 BY CM    Culture (A)  Final    PROTEUS MIRABILIS STAPHYLOCOCCUS SPECIES (COAGULASE NEGATIVE) ORGANISM 2 THE SIGNIFICANCE OF ISOLATING THIS ORGANISM FROM A SINGLE SET OF BLOOD CULTURES WHEN MULTIPLE SETS ARE DRAWN IS UNCERTAIN. PLEASE NOTIFY THE MICROBIOLOGY DEPARTMENT WITHIN ONE WEEK IF SPECIATION AND SENSITIVITIES ARE REQUIRED. Performed at [facility], [place] [address] [place], [place] [address]    Report Status 09/20/2019 FINAL  Final   Organism ID, Bacteria PROTEUS MIRABILIS  Final      Susceptibility   Proteus mirabilis - MIC*    AMPICILLIN <=2 SENSITIVE Sensitive     CEFAZOLIN <=4 SENSITIVE Sensitive     CEFEPIME <=1 SENSITIVE Sensitive     CEFTAZIDIME <=1 SENSITIVE Sensitive     CEFTRIAXONE <=1 SENSITIVE Sensitive     CIPROFLOXACIN <=0.25 SENSITIVE Sensitive  GENTAMICIN <=1 SENSITIVE Sensitive     IMIPENEM 1 SENSITIVE Sensitive     TRIMETH/SULFA <=20 SENSITIVE Sensitive     AMPICILLIN/SULBACTAM <=2 SENSITIVE Sensitive     PIP/TAZO <=4 SENSITIVE Sensitive     * PROTEUS MIRABILIS  Blood Culture ID Panel (Reflexed)     Status: Abnormal    Collection Time: 09/16/19  4:01 PM  Result Value Ref Range Status   Enterococcus species NOT DETECTED NOT DETECTED Final   Listeria monocytogenes NOT DETECTED NOT DETECTED Final   Staphylococcus species DETECTED (A) NOT DETECTED Final    Comment: Methicillin (oxacillin) resistant coagulase negative staphylococcus. Possible blood culture contaminant (unless isolated from more than one blood culture draw or clinical case suggests pathogenicity). No antibiotic treatment is indicated for blood  culture contaminants. CRITICAL RESULT CALLED TO, READ BACK BY AND VERIFIED WITH: PHARMD D WAFFORD [id number] AT 1440 BY CM    Staphylococcus aureus (BCID) NOT DETECTED NOT DETECTED Final   Methicillin resistance DETECTED (A) NOT DETECTED Final    Comment: CRITICAL RESULT CALLED TO, READ BACK BY AND VERIFIED WITH: PHARMD D WOFFORD [id number] AT 1440 BY CM    Streptococcus species NOT DETECTED NOT DETECTED Final   Streptococcus agalactiae NOT DETECTED NOT DETECTED Final   Streptococcus pneumoniae NOT DETECTED NOT DETECTED Final   Streptococcus pyogenes NOT DETECTED NOT DETECTED Final   Acinetobacter baumannii NOT DETECTED NOT DETECTED Final   Enterobacteriaceae species DETECTED (A) NOT DETECTED Final    Comment: Enterobacteriaceae represent a large family of gram-negative bacteria, not a single organism. CRITICAL RESULT CALLED TO, READ BACK BY AND VERIFIED WITH: PHARMD D WAFFORD [id number] AT 1440 BY CM    Enterobacter cloacae complex NOT DETECTED NOT DETECTED Final   Escherichia coli NOT DETECTED NOT DETECTED Final   Klebsiella oxytoca NOT DETECTED NOT DETECTED Final   Klebsiella pneumoniae NOT DETECTED NOT DETECTED Final   Proteus species DETECTED (A) NOT DETECTED Final    Comment: CRITICAL RESULT CALLED TO, READ BACK BY AND VERIFIED WITH: PHARMD D WAFFORD [id number] AT 1440 BY CM    Serratia marcescens NOT DETECTED NOT DETECTED Final   Carbapenem resistance NOT DETECTED NOT DETECTED Final   Haemophilus  influenzae NOT DETECTED NOT DETECTED Final   Neisseria meningitidis NOT DETECTED NOT DETECTED Final   Pseudomonas aeruginosa NOT DETECTED NOT DETECTED Final   Candida albicans NOT DETECTED NOT DETECTED Final   Candida glabrata NOT DETECTED NOT DETECTED Final   Candida krusei NOT DETECTED NOT DETECTED Final   Candida parapsilosis NOT DETECTED NOT DETECTED Final   Candida tropicalis NOT DETECTED NOT DETECTED Final    Comment: Performed at [facility], [place] [address] [place], [place] [address]  Respiratory Panel by RT PCR (Flu A&B, Covid) - Nasopharyngeal Swab     Status: None   Collection Time: 09/16/19  4:42 PM   Specimen: Nasopharyngeal Swab  Result Value Ref Range Status   SARS Coronavirus 2 by RT PCR NEGATIVE NEGATIVE Final    Comment: (NOTE) SARS-CoV-2 target nucleic acids are NOT DETECTED. The SARS-CoV-2 RNA is generally detectable in upper respiratoy specimens during the acute phase of infection. The lowest concentration of SARS-CoV-2 viral copies this assay can detect is 131 copies/mL. A negative result does not preclude SARS-Cov-2 infection and should not be used as the sole basis for treatment or other patient management decisions. A negative result may occur with  improper specimen collection/handling, submission of specimen other than nasopharyngeal swab, presence of viral mutation(s) within the areas  targeted by this assay, and inadequate number of viral copies (<131 copies/mL). A negative result must be combined with clinical observations, patient history, and epidemiological information. The expected result is Negative. Fact Sheet for Patients:  PinkCheek.be Fact Sheet for Healthcare Providers:  GravelBags.it This test is not yet ap proved or cleared by the [place] FDA and  has been authorized for detection and/or diagnosis of SARS-CoV-2 by FDA under an Emergency Use Authorization (EUA). This EUA  will remain  in effect (meaning this test can be used) for the duration of the COVID-19 declaration under Section 564(b)(1) of the Act, 21 U.S.C. section 360bbb-3(b)(1), unless the authorization is terminated or revoked sooner.    Influenza A by PCR NEGATIVE NEGATIVE Final   Influenza B by PCR NEGATIVE NEGATIVE Final    Comment: (NOTE) The Xpert Xpress SARS-CoV-2/FLU/RSV assay is intended as an aid in  the diagnosis of influenza from Nasopharyngeal swab specimens and  should not be used as a sole basis for treatment. Nasal washings and  aspirates are unacceptable for Xpert Xpress SARS-CoV-2/FLU/RSV  testing. Fact Sheet for Patients: PinkCheek.be Fact Sheet for Healthcare Providers: GravelBags.it This test is not yet approved or cleared by the [place] FDA and  has been authorized for detection and/or diagnosis of SARS-CoV-2 by  FDA under an Emergency Use Authorization (EUA). This EUA will remain  in effect (meaning this test can be used) for the duration of the  Covid-19 declaration under Section 564(b)(1) of the Act, 21  U.S.C. section 360bbb-3(b)(1), unless the authorization is  terminated or revoked. Performed at [facility], [place] [address], [place], [place] [address]   Urine culture     Status: None   Collection Time: 09/16/19  6:33 PM   Specimen: In/Out Cath Urine  Result Value Ref Range Status   Specimen Description   Final    IN/OUT CATH URINE Performed at [facility] [address] [place], [place] [address]    Special Requests   Final    NONE Performed at [facility], [place] [address] [place], [place] [address]    Culture   Final    NO GROWTH Performed at [facility], [place] [address] [place], [place] [address]    Report Status 09/18/2019 FINAL  Final  MRSA PCR Screening     Status: None   Collection Time: 09/17/19  4:12 AM    Specimen: Nasopharyngeal  Result Value Ref Range Status   MRSA by PCR NEGATIVE NEGATIVE Final    Comment:        The GeneXpert MRSA Assay (FDA approved for NASAL specimens only), is one component of a comprehensive MRSA colonization surveillance program. It is not intended to diagnose MRSA infection nor to guide or monitor treatment for MRSA infections. Performed at [facility], [place] [address], [place],  [address]   Body fluid culture (includes gram stain)     Status: None   Collection Time: 09/18/19 11:01 AM   Specimen: Pleural Fluid  Result Value Ref Range Status   Specimen Description   Final    PLEURAL Performed at [facility] [address], [place],  [address]    Special Requests   Final    NONE Performed at [facility], [place] [address] [place], [place] [address]    Gram Stain   Final    ABUNDANT WBC PRESENT, PREDOMINANTLY PMN NO ORGANISMS SEEN    Culture  Final    NO GROWTH 3 DAYS Performed at [facility], [place] [address] [place], [place] [address]    Report Status 09/22/2019 FINAL  Final  Culture, body fluid-bottle     Status: None (Preliminary result)   Collection Time: 09/23/19 12:30 PM   Specimen: Pleura  Result Value Ref Range Status   Specimen Description PLEURAL  Final   Special Requests NONE  Final   Culture   Final    NO GROWTH < 24 HOURS Performed at [facility], [place] [address] [place], [place] [address]    Report Status PENDING  Incomplete  Gram stain     Status: None   Collection Time: 09/23/19 12:30 PM   Specimen: Pleura  Result Value Ref Range Status   Specimen Description PLEURAL  Final   Special Requests NONE  Final   Gram Stain   Final    NO WBC SEEN NO ORGANISMS SEEN Performed at [facility], [address] [place], [place] [address]    Report Status 09/24/2019 FINAL  Final     Radiology Studies: CT CHEST W CONTRAST  Result Date:  09/22/2019 CLINICAL DATA:  Pneumonia, effusion or abscess suspected, large pneumonia with diffusion requiring thoracentesis, persistent to kidney and leukocytosis EXAM: CT CHEST WITH CONTRAST TECHNIQUE: Multidetector CT imaging of the chest was performed during intravenous contrast administration. CONTRAST:  81mL OMNIPAQUE IOHEXOL 300 MG/ML  SOLN COMPARISON:  Chest radiographs, 09/19/2019 FINDINGS: Cardiovascular: Aortic atherosclerosis. Normal heart size. Scattered coronary artery calcifications. No pericardial effusion. Mediastinum/Nodes: No enlarged mediastinal, hilar, or axillary lymph nodes. Thyroid gland, trachea, and esophagus demonstrate no significant findings. Lungs/Pleura: There is a large left pleural effusion with near total atelectasis or consolidation of the left lung. Small right pleural effusion and associated atelectasis or consolidation of the dependent right lung base. The left mainstem and distal bronchi and right intralobar and distal bronchi are fluid and debris filled. Upper Abdomen: No acute abnormality. Musculoskeletal: No chest wall mass or suspicious bone lesions identified. Dystrophic appearing calcifications in the lateral left breast. IMPRESSION: 1. Large left pleural effusion with near total atelectasis or consolidation of the left lung. 2. Small right pleural effusion and associated atelectasis or consolidation of the dependent right lung base. 3. The left mainstem and distal bronchi and right intralobar and distal bronchi are fluid and debris filled. 4. Coronary artery disease. Aortic Atherosclerosis (ICD10-I70.0). Electronically Signed   By: Eddie Candle M.D.   On: 09/22/2019 15:34   DG CHEST PORT 1 VIEW  Result Date: 09/24/2019 CLINICAL DATA:  Increased shortness of breath. EXAM: PORTABLE CHEST 1 VIEW COMPARISON:  09/23/2019 FINDINGS: NG tube in place with side port below GE junction. Normal heart size. Bilateral posterior layering pleural effusions. Left midlung and left lower  lobe airspace disease unchanged. IMPRESSION: No change in bilateral pleural effusions and left midlung and left lower lobe airspace disease compatible with pneumonia. Electronically Signed   By: Kerby Moors M.D.   On: 09/24/2019 09:43   DG Chest Port 1 View  Result Date: 09/23/2019 CLINICAL DATA:  Left effusion status post thoracentesis EXAM: PORTABLE CHEST 1 VIEW COMPARISON:  09/19/2019 FINDINGS: Improvement in the left effusion following thoracentesis. No pneumothorax. Small pleural effusions remain bilaterally. Slight improvement in the left lung aeration but persistent left basilar collapse/consolidation remains. Stable heart size and vascularity. Aorta atherosclerotic. NG tube enters the stomach with the tip not visualized. Degenerative changes of the spine and shoulders. Postop changes in the left  breast and axilla. IMPRESSION: Improvement in the left effusion following thoracentesis. No pneumothorax. Electronically Signed   By: Jerilynn Mages.  Shick M.D.   On: 09/23/2019 13:12   US THORACENTESIS ASP PLEURAL SPACE W/IMG GUIDE  Result Date: 09/23/2019 INDICATION: Pleural effusion, severe pneumonia EXAM: ULTRASOUND GUIDED LEFT THORACENTESIS MEDICATIONS: 1% lidocaine local COMPLICATIONS: None immediate. PROCEDURE: An ultrasound guided thoracentesis was thoroughly discussed with the patient's husband and questions answered. The benefits, risks, alternatives and complications were also discussed. The patient understands and wishes to proceed with the procedure. Written consent was obtained. Ultrasound was performed to localize and mark an adequate pocket of fluid in the left chest. The area was then prepped and draped in the normal sterile fashion. 1% Lidocaine was used for local anesthesia. Under ultrasound guidance a 6 Fr Safe-T-Centesis catheter was introduced. Thoracentesis was performed. The catheter was removed and a dressing applied. FINDINGS: A total of approximately 820 cc of yellow serosanguineous pleural  fluid was removed. Samples were sent to the laboratory as requested by the clinical team. IMPRESSION: Successful ultrasound guided left thoracentesis yielding 820 cc of pleural fluid. Electronically Signed   By: Jerilynn Mages.  Shick M.D.   On: 09/23/2019 12:47    Scheduled Meds: . Chlorhexidine Gluconate Cloth  6 each Topical Daily  . collagenase   Topical Daily  . donepezil  10 mg Per Tube QHS  . feeding supplement (OSMOLITE 1.5 CAL)  1,000 mL Per Tube Q24H  . feeding supplement (PRO-STAT SUGAR FREE 64)  30 mL Per Tube BID  . fentaNYL      . free water  100 mL Per Tube Q6H  . Gerhardt's butt cream   Topical BID  . heparin  5,000 Units Subcutaneous Q8H  . insulin aspart  0-9 Units Subcutaneous Q4H  . mouth rinse  15 mL Mouth Rinse BID  . memantine  10 mg Per Tube BID  . midazolam      . midodrine  10 mg Per Tube TID WC  . QUEtiapine  100 mg Per Tube QHS   Continuous Infusions: . amiodarone Stopped (09/24/19 0000)  . cefTRIAXone (ROCEPHIN)  IV Stopped (09/23/19 2249)  . dexmedetomidine (PRECEDEX) IV infusion    . lactated ringers 75 mL/hr at 09/24/19 0457  . lactated ringers       LOS: 8 days   Marylu Lund, MD [facility] Pager On Amion  If 7PM-7AM, please contact night-coverage 09/24/2019, 11:41 AM

## 2019-09-24 NOTE — Progress Notes (Signed)
[facility] paged about pt moaning in pain. MD reviewed pt's home meds & added some necessary daily medications. MD aware of soft bp. Will monitor

## 2019-09-25 ENCOUNTER — Inpatient Hospital Stay (HOSPITAL_COMMUNITY): Payer: Medicare Other

## 2019-09-25 DIAGNOSIS — I5021 Acute systolic (congestive) heart failure: Secondary | ICD-10-CM

## 2019-09-25 DIAGNOSIS — J9600 Acute respiratory failure, unspecified whether with hypoxia or hypercapnia: Secondary | ICD-10-CM

## 2019-09-25 DIAGNOSIS — J90 Pleural effusion, not elsewhere classified: Secondary | ICD-10-CM

## 2019-09-25 LAB — GLUCOSE, CAPILLARY
Glucose-Capillary: 122 mg/dL — ABNORMAL HIGH (ref 70–99)
Glucose-Capillary: 126 mg/dL — ABNORMAL HIGH (ref 70–99)
Glucose-Capillary: 131 mg/dL — ABNORMAL HIGH (ref 70–99)
Glucose-Capillary: 131 mg/dL — ABNORMAL HIGH (ref 70–99)
Glucose-Capillary: 96 mg/dL (ref 70–99)
Glucose-Capillary: 96 mg/dL (ref 70–99)

## 2019-09-25 LAB — COMPREHENSIVE METABOLIC PANEL
ALT: 37 U/L (ref 0–44)
AST: 36 U/L (ref 15–41)
Albumin: 2.4 g/dL — ABNORMAL LOW (ref 3.5–5.0)
Alkaline Phosphatase: 92 U/L (ref 38–126)
Anion gap: 7 (ref 5–15)
BUN: 36 mg/dL — ABNORMAL HIGH (ref 8–23)
CO2: 27 mmol/L (ref 22–32)
Calcium: 8.4 mg/dL — ABNORMAL LOW (ref 8.9–10.3)
Chloride: 106 mmol/L (ref 98–111)
Creatinine, Ser: 0.44 mg/dL (ref 0.44–1.00)
GFR calc Af Amer: >60 mL/min (ref >60)
GFR calc non Af Amer: >60 mL/min (ref >60)
Glucose, Bld: 130 mg/dL — ABNORMAL HIGH (ref 70–99)
Potassium: 4.9 mmol/L (ref 3.5–5.1)
Sodium: 140 mmol/L (ref 135–145)
Total Bilirubin: 0.4 mg/dL (ref 0.3–1.2)
Total Protein: 4.9 g/dL — ABNORMAL LOW (ref 6.5–8.1)

## 2019-09-25 LAB — AMMONIA: Ammonia: 25 umol/L (ref 9–35)

## 2019-09-25 LAB — CBC
HCT: 29.2 % — ABNORMAL LOW (ref 36.0–46.0)
Hemoglobin: 9 g/dL — ABNORMAL LOW (ref 12.0–15.0)
MCH: 30.6 pg (ref 26.0–34.0)
MCHC: 30.8 g/dL (ref 30.0–36.0)
MCV: 99.3 fL (ref 80.0–100.0)
Platelets: 128 10*3/uL — ABNORMAL LOW (ref 150–400)
RBC: 2.94 MIL/uL — ABNORMAL LOW (ref 3.87–5.11)
RDW: 14.2 % (ref 11.5–15.5)
WBC: 16.3 10*3/uL — ABNORMAL HIGH (ref 4.0–10.5)
nRBC: 0 % (ref 0.0–0.2)

## 2019-09-25 MED ORDER — FUROSEMIDE 10 MG/ML IJ SOLN
20.0000 mg | Freq: Every day | INTRAMUSCULAR | Status: DC
  Administered 2019-09-25 – 2019-09-26 (×2): 20 mg via INTRAVENOUS
  Filled 2019-09-25 (×2): qty 2

## 2019-09-25 MED ORDER — VITAL HIGH PROTEIN PO LIQD
1000.0000 mL | ORAL | Status: AC
  Administered 2019-09-25 – 2019-09-30 (×6): 1000 mL

## 2019-09-25 MED ORDER — PANTOPRAZOLE SODIUM 40 MG IV SOLR
40.0000 mg | Freq: Every day | INTRAVENOUS | Status: DC
  Administered 2019-09-25 – 2019-10-05 (×11): 40 mg via INTRAVENOUS
  Filled 2019-09-25 (×11): qty 40

## 2019-09-25 NOTE — Progress Notes (Signed)
SLP Cancellation Note  Patient Details Name: Barbara Flowers MRN: [id number] DOB: [birth year]   Cancelled treatment:       Reason Eval/Treat Not Completed: Medical issues which prohibited therapy. Pt is currently orally intubated. RN reports pt may be extubated this afternoon. Will continue efforts to determine appropriateness for po intake.  Carmela Rima, [place] Speech Language Pathologist Office: [phone] Pager: [phone]  Shonna Chock 09/25/2019, 9:36 AM

## 2019-09-25 NOTE — Progress Notes (Signed)
Nutrition Follow-up  DOCUMENTATION CODES:   Non-severe (moderate) malnutrition in context of chronic illness  INTERVENTION:  - will change TF regimen: Vital High Protein @ 50 ml/hr with 100 ml free water QID. - this regimen will provide 1200 kcal, 105 grams protein, and 1403 ml free water.    NUTRITION DIAGNOSIS:   Moderate Malnutrition related to chronic illness(wheelchair bound x3 years following CVA) as evidenced by mild fat depletion, mild muscle depletion. 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 She presented to the ED due to 1 week of worsening cough and congestion. She was also experiencing poor oral intake, AMS, and increased weakness.  Significant Events: 12/26- admission 12/29- TF initiation  1/3- intubation  Able to talk with RN outside of patient's room. Patient is currently weaning on the vent and there is a possibility that she may be extubated later in the day today.   She has NGT in R nare and is receiving Osmolite 1.5 @ 50 ml/hr with 30 ml prostat BID and 100 ml free water QID. This regimen is providing 2000 kcal, 105 grams protein, and 1314 ml free water.   Re-estimated kcal need based on weight on 1/3 (60 kg) as weight had been stable 12/29-1/3. Will adjust TF regimen as outlined above but have this go into effect this evening as RN hung a new bottle of Osmolite 1.5 a short time ago.  Flow sheet documentation indicates patient has deep pitting edema to RUE and very deep pitting edema to LUE and BLE.   Per notes: - acute hypoxemic and hypercarbic respiratory failure--on vent but may be a candidate for extubation  - recurrent bilateral pleural  effusions--lasix added today (1/4) - proteus bacteremia - acute metabolic encephalopathy with hx of dementia  - pressure injuries POA   Patient is currently intubated on ventilator support MV: 6.4 L/min Temp (24hrs), Avg:97.7 F (36.5 C), Min:97.5 F (36.4 C), Max:98.1 F (36.7 C) Propofol: none BP: 123/55 and MAP: 77    Labs reviewed; CBGs: 126 and 131 mg/dl, BUN: 36 mg/dl, Ca: 8.4 mg/dl. Medications reviewed; 25 g albumin x1 dose 1/3, 20 mg IV lasix/day, sliding scale novolog,  IVF; LR @ 50 ml/hr.    Diet Order:   Diet Order            Diet NPO time specified  Diet effective now              EDUCATION NEEDS:   Not appropriate for education at this time  Skin:  Skin Assessment: Skin Integrity Issues: Skin Integrity Issues:: Stage I, Stage II, Stage III Stage I: upper L back Stage II: L buttocks and L thigh Stage III: sacrum  Last BM:  12/29  Height:   Ht Readings from Last 1 Encounters:  09/24/19 '5\' 8"'$  (1.727 m)    Weight:   Wt Readings from Last 1 Encounters:  09/25/19 73.3 kg    Ideal Body Weight:  63.6 kg  BMI:  Body mass index is 24.57 kg/m.  Estimated Nutritional Needs:   Kcal:  1246 kcal  Protein:  90-105 grams  Fluid:  >/= 2 L/day       Jarome Matin, MS, RD,  LDN, CNSC Inpatient Clinical Dietitian Pager # [phone] After hours/weekend pager # [phone]

## 2019-09-25 NOTE — Plan of Care (Signed)
PCCM Family Communication Note  I spoke with the patient's husband to provide daily updates. He advised that he is en route to the hospital and we agree to discuss upon his arrival.    Eliseo Gum MSN, AGACNP-BC [place] [phone] If no answer, [id number] 09/25/2019, 11:43 AM

## 2019-09-25 NOTE — Progress Notes (Signed)
NAME:  Barbara Flowers, MRN:  [id number], DOB:  [birth year], LOS: 9 ADMISSION DATE:  09/16/2019, CONSULTATION DATE:  12/26  REFERRING MD:  Earlie Counts, CHIEF COMPLAINT: Shortness of breath, altered mental status  Brief History   70 yowf never smoker with h/o breast ca s/p bone marrow transplant early 1990s in [place] and cancer free s recent rx but problems with sundowning/ dementia and w/c bound x 3 years p cva acutely worse x one week PTA with cough/ congestion rx over the phone by PCP since 12/21 and gradually worse x 2 days PTA with poor po intake, ams and weakness with mostly mucoid sputum > to ER pm 12/26 with extensive L infiltrate and L effusion with hypoxemia corrected on non rebreathing and PCCM service asked to admit. Signed off to the hospitalist service Was asked to see her again today for altered mental status, hypoxemia, shortness of breath Post thoracentesis for about 820 cc of fluid  Past Medical History   Past Medical History:  Diagnosis Date  . Anemia   . Anemia   . Anxiety disorder   . Blood transfusion without reported diagnosis   . Brain cancer [facility])    s/p sterotactic radio surgery  . Brain tumor ([place])   . Breast cancer ([place])   . Cancer ([place]) 1990   L breast  . H/O [place])   . Hypothyroidism   . IBS (irritable bowel syndrome)   . MDD (major depressive disorder)   . Memory loss   . Obesity   . Orthostatic hypotension   . Seizure ([place])   . Seizures ([place])   . Stroke [facility]) 2005   hemorrhagic  . Stroke ([place])   . Subdural hematoma ([place])   . Upper brachial plexus paralysis syndrome   . Urinary incontinence    Significant Hospital Events   Worsening mental status Requiring nonrebreather mask at present   Consults:  PCCM  Procedures:  1/3 ETT>>>  Significant Diagnostic Tests:  Blood culture 12/28 bedside thoracentesis-200 cc 1/2 thoracentesis-820 cc of fluid drained Chest x-ray 09/24/2019-bilateral pleural effusions, possible left lower  lobe infiltrate -ECHO 1/3 LVEF 25-30%, severely decreased LV systolic function. LV grade II diastolic dysfunction. RV severely reduced systolic function. Trivial pericardial effusion , L pleural effusion is seen   Micro Data:  Blood culture with Proteus mirabilis 12/26  Antimicrobials:  Rocephin day 10 of 14  Azithromycin 12/27-28 Vancomycin 12/26 Interim history/subjective:  Intubated 1/3  NAEO  No labs have resulted this morning (1/4)   Weaning well PSV/CPAP this morning. Following some commands. Afebrile   Objective   Blood pressure (!) 122/51, pulse 62, temperature 98.1 F (36.7 C), temperature source Axillary, resp. rate 14, height 5\' 8"  (1.727 m), weight 73.3 kg, SpO2 100 %.    Vent Mode: PRVC FiO2 (%):  [50 %-100 %] 50 % Set Rate:  [14 bmp] 14 bmp Vt Set:  [510 mL] 510 mL PEEP:  [5 cmH20] 5 cmH20 Plateau Pressure:  [17 cmH20-20 cmH20] 17 cmH20   Intake/Output Summary (Last 24 hours) at 09/25/2019 0744 Last data filed at 09/25/2019 0000 Gross per 24 hour  Intake 1257.05 ml  Output 500 ml  Net 757.05 ml   Filed Weights   09/23/19 0355 09/24/19 0500 09/25/19 0500  Weight: 59.9 kg 60 kg 73.3 kg    Examination: General: Frail, chronically ill appearing elderly F, intubated NAD reclined in bed  HENT: NCAT. Thinning hair. Pink mucus membrane. Tender trachea to palpation.  Lungs: Diminished bibasilar  sounds. Bilateral rhonchi. No accessory use on PSV/CPAP Cardiovascular: RRR s1s2 no rgm Cap refill < 2 seconds  Abdomen: Soft, bowel sounds appreciated Extremities: Third spacing appreciated BUE BLE. Chronic LUE contracture. Decreased muscle bulk and tone, symmetrical. No cyanosis or clubbing  Neuro: Awake, calm, following basic commands. Generally very weak.  GU: defer Skin: pale, clean, dry, warm. Scattered ecchymosis.   Resolved Hospital Problem list     Assessment & Plan:   Acute hypoxemic and hypercarbic respiratory failure requiring intubation  -intubated  1/3 PNA Bilateral pleural effusions s/p thora, recurring  -transudative  P -Follow up CXR 1/4 -Weaning PSV/CPAP -- follow up later this morning, may be candidate for extubation - Pulm hygiene, suctioning  - Precedex for sedation  -Will add lasix 1/4 for recurring effusions. With poor baseline nutrition status, am uncertain if repeating thoracenteses will provide sustainable benefit  -Continue rocephin   Proteus bacteremia -day 10 of 14 day course rocephin   Acute metabolic encephalopathy in setting of critical illness, bacteremia, on chronic dementia  -Trend LFTs, check ammonia  -Abx as above   Systolic and diastolic heart failure -ECHO 1/3 LVEF 25-30%, severely decreased LV systolic function. LV grade II diastolic dysfunction. RV severely reduced systolic function. Trivial pericardial effusion  -On ECHO, large L pleural effusion is seen  -Prior ECHO is from 2016 with LVEF 55-6-%  P -judicious evaluation of IVF vs diuretics  -will add lasix 20mg  qD   Sacral decubitus wound  -present on admission, un-stagable  P -Wound care  Hx Dementia Hx CVA -On Namenda, Aricept, seroquel  -Delirium precautions  -supportive care   Inadequate PO intake -EN   Best practice:  Diet: Tube feeding Pain/Anxiety/Delirium protocol (if indicated): Precedex, xanax, PRN fent  VAP protocol (if indicated): Not indicated DVT prophylaxis: Subcu heparin Mobility: Bedrest Code Status: Full code Family Communication: Pending 1/4  Disposition: icu  Labs   CBC: Recent Labs  Lab 09/19/19 0235 09/21/19 0234 09/22/19 0220 09/23/19 0144 09/24/19 0305  WBC 18.2* 18.7* 16.5* 14.6* 11.8*  HGB 10.3* 10.3* 10.3* 8.5* 8.7*  HCT 33.1* 34.0* 33.9* 27.4* 28.6*  MCV 95.9 98.0 100.3* 96.5 98.3  PLT 182 161 141* 133* 149*    Basic Metabolic Panel: Recent Labs  Lab 09/19/19 0235 09/19/19 1318 09/19/19 1800 09/20/19 0906 09/20/19 1648 09/21/19 0234 09/22/19 0220 09/23/19 0144 09/24/19 0305   NA 144  --   --   --   --  143 144 141 140  K 4.0  --   --   --   --  3.9 4.1 4.1 4.3  CL 107  --   --   --   --  108 108 107 106  CO2 24  --   --   --   --  24 26 25 26   GLUCOSE 93  --   --   --   --  147* 133* 124* 155*  BUN 24*  --   --   --   --  30* 27* 30* 32*  CREATININE 0.57  --   --   --   --  0.50 0.47 0.49 0.42*  CALCIUM 8.5*  --   --   --   --  8.0* 8.4* 7.8* 7.9*  MG  --  1.8 1.8 1.8 1.7 1.7  --   --   --   PHOS  --  2.0* 2.0* 1.6* 1.7*  --   --   --   --    GFR:  Estimated Creatinine Clearance: 67 mL/min (A) (by C-G formula based on SCr of 0.42 mg/dL (L)). Recent Labs  Lab 09/19/19 0509 09/21/19 0234 09/22/19 0220 09/23/19 0144 09/24/19 0305  PROCALCITON 0.40  --   --   --   --   WBC  --  18.7* 16.5* 14.6* 11.8*    Liver Function Tests: Recent Labs  Lab 09/21/19 0234 09/22/19 0220 09/23/19 0144 09/24/19 0305 09/24/19 1034  AST 50* 41 199* 54* 47*  ALT 38 33 76* 51* 47*  ALKPHOS 137* 102 163* 114 104  BILITOT 0.7 0.8 0.5 0.6 0.5  PROT 5.1* 5.9* 5.0* 4.7* 4.8*  ALBUMIN 2.5* 3.4* 2.9* 2.4* 2.6*   No results for input(s): LIPASE, AMYLASE in the last 168 hours. Recent Labs  Lab 09/24/19 1034  AMMONIA 30    ABG    Component Value Date/Time   PHART 7.508 (H) 09/24/2019 1320   PCO2ART 34.1 09/24/2019 1320   PO2ART 195 (H) 09/24/2019 1320   HCO3 26.8 09/24/2019 1320   TCO2 24 08/31/2015 1142   ACIDBASEDEF 0.8 09/16/2019 1622   O2SAT 99.1 09/24/2019 1320     Coagulation Profile: No results for input(s): INR, PROTIME in the last 168 hours.  Cardiac Enzymes: No results for input(s): CKTOTAL, CKMB, CKMBINDEX, TROPONINI in the last 168 hours.  HbA1C: Hgb A1c MFr Bld  Date/Time Value Ref Range Status  09/18/2019 01:30 PM 5.6 4.8 - 5.6 % Final    Comment:    (NOTE) Pre diabetes:          5.7%-6.4% Diabetes:              >6.4% Glycemic control for   <7.0% adults with diabetes   09/01/2015 04:28 AM 5.7 (H) 4.8 - 5.6 % Final    Comment:     (NOTE)         Pre-diabetes: 5.7 - 6.4         Diabetes: >6.4         Glycemic control for adults with diabetes: <7.0     CBG: Recent Labs  Lab 09/24/19 1454 09/24/19 1929 09/24/19 2322 09/25/19 0322 09/25/19 0731  GLUCAP 146* 158* 153* 126* 131*    CRITICAL CARE Performed by: Cristal Generous   Total critical care time: 40 minutes  Critical care time was exclusive of separately billable procedures and treating other patients. Critical care was necessary to treat or prevent imminent or life-threatening deterioration.  Critical care was time spent personally by me on the following activities: development of treatment plan with patient and/or surrogate as well as nursing, discussions with consultants, evaluation of patient's response to treatment, examination of patient, obtaining history from patient or surrogate, ordering and performing treatments and interventions, ordering and review of laboratory studies, ordering and review of radiographic studies, pulse oximetry and re-evaluation of patient's condition.  Eliseo Gum MSN, AGACNP-BC [place] [phone] If no answer, [id number] 09/25/2019, 7:44 AM

## 2019-09-26 ENCOUNTER — Inpatient Hospital Stay (HOSPITAL_COMMUNITY): Payer: Medicare Other

## 2019-09-26 DIAGNOSIS — I5041 Acute combined systolic (congestive) and diastolic (congestive) heart failure: Secondary | ICD-10-CM

## 2019-09-26 DIAGNOSIS — I5021 Acute systolic (congestive) heart failure: Secondary | ICD-10-CM

## 2019-09-26 DIAGNOSIS — E8770 Fluid overload, unspecified: Secondary | ICD-10-CM

## 2019-09-26 LAB — COMPREHENSIVE METABOLIC PANEL
ALT: 39 U/L (ref 0–44)
AST: 38 U/L (ref 15–41)
Albumin: 2.3 g/dL — ABNORMAL LOW (ref 3.5–5.0)
Alkaline Phosphatase: 81 U/L (ref 38–126)
Anion gap: 8 (ref 5–15)
BUN: 37 mg/dL — ABNORMAL HIGH (ref 8–23)
CO2: 29 mmol/L (ref 22–32)
Calcium: 8 mg/dL — ABNORMAL LOW (ref 8.9–10.3)
Chloride: 101 mmol/L (ref 98–111)
Creatinine, Ser: 0.4 mg/dL — ABNORMAL LOW (ref 0.44–1.00)
GFR calc Af Amer: >60 mL/min (ref >60)
GFR calc non Af Amer: >60 mL/min (ref >60)
Glucose, Bld: 94 mg/dL (ref 70–99)
Potassium: 4.2 mmol/L (ref 3.5–5.1)
Sodium: 138 mmol/L (ref 135–145)
Total Bilirubin: 0.5 mg/dL (ref 0.3–1.2)
Total Protein: 4.6 g/dL — ABNORMAL LOW (ref 6.5–8.1)

## 2019-09-26 LAB — GLUCOSE, CAPILLARY
Glucose-Capillary: 85 mg/dL (ref 70–99)
Glucose-Capillary: 96 mg/dL (ref 70–99)
Glucose-Capillary: 90 mg/dL (ref 70–99)
Glucose-Capillary: 89 mg/dL (ref 70–99)
Glucose-Capillary: 91 mg/dL (ref 70–99)
Glucose-Capillary: 94 mg/dL (ref 70–99)

## 2019-09-26 LAB — CBC
HCT: 27.1 % — ABNORMAL LOW (ref 36.0–46.0)
Hemoglobin: 8 g/dL — ABNORMAL LOW (ref 12.0–15.0)
MCH: 29.3 pg (ref 26.0–34.0)
MCHC: 29.5 g/dL — ABNORMAL LOW (ref 30.0–36.0)
MCV: 99.3 fL (ref 80.0–100.0)
Platelets: 195 10*3/uL (ref 150–400)
RBC: 2.73 MIL/uL — ABNORMAL LOW (ref 3.87–5.11)
RDW: 14.1 % (ref 11.5–15.5)
WBC: 12.3 10*3/uL — ABNORMAL HIGH (ref 4.0–10.5)
nRBC: 0 % (ref 0.0–0.2)

## 2019-09-26 LAB — CYTOLOGY - NON PAP

## 2019-09-26 MED ORDER — FUROSEMIDE 10 MG/ML IJ SOLN
40.0000 mg | Freq: Two times a day (BID) | INTRAMUSCULAR | Status: DC
  Administered 2019-09-27 – 2019-09-29 (×5): 40 mg via INTRAVENOUS
  Filled 2019-09-26 (×4): qty 4

## 2019-09-26 MED ORDER — FUROSEMIDE 10 MG/ML IJ SOLN
20.0000 mg | Freq: Two times a day (BID) | INTRAMUSCULAR | Status: DC
  Administered 2019-09-26: 20 mg via INTRAVENOUS
  Filled 2019-09-26: qty 2

## 2019-09-26 NOTE — Evaluation (Signed)
SLP Cancellation Note  Patient Details Name: Barbara Flowers MRN: [id number] DOB: 10/29/1950   Cancelled treatment:       Reason Eval/Treat Not Completed: (pt remains on vent and plan for full code and tx noted, will continue efforts)  Kathleen Lime, MS [facility] SLP Acute Rehab Services Office [phone]  [name] 09/26/2019, 3:34 PM

## 2019-09-26 NOTE — Progress Notes (Signed)
NAME:  Barbara Flowers, MRN:  [id number], DOB:  1951/03/04, LOS: 24 ADMISSION DATE:  09/16/2019, CONSULTATION DATE:  12/26  REFERRING MD:  Earlie Counts, CHIEF COMPLAINT: Shortness of breath, altered mental status  Brief History   58 yowf never smoker with h/o breast ca s/p bone marrow transplant early 1990s in [place] and cancer free s recent rx but problems with sundowning/ dementia and w/c bound x 3 years p cva acutely worse x one week PTA with cough/ congestion rx over the phone by PCP since 12/21 and gradually worse x 2 days PTA with poor po intake, ams and weakness with mostly mucoid sputum > to ER pm 12/26 with extensive L infiltrate and L effusion with hypoxemia corrected on non rebreathing and PCCM service asked to admit. Signed off to the hospitalist service Was asked to see her again today for altered mental status, hypoxemia, shortness of breath Post thoracentesis for about 820 cc of fluid  Past Medical History   Past Medical History:  Diagnosis Date  . Anemia   . Anemia   . Anxiety disorder   . Blood transfusion without reported diagnosis   . Brain cancer [facility])    s/p sterotactic radio surgery  . Brain tumor ([place])   . Breast cancer ([place])   . Cancer ([place]) 1990   L breast  . H/O bone marrow transplant ([place])   . Hypothyroidism   . IBS (irritable bowel syndrome)   . MDD (major depressive disorder)   . Memory loss   . Obesity   . Orthostatic hypotension   . Seizure ([place])   . Seizures ([place])   . Stroke [facility]) 2005   hemorrhagic  . Stroke ([place])   . Subdural hematoma ([place])   . Upper brachial plexus paralysis syndrome   . Urinary incontinence    Significant Hospital Events   12/28 thora 1/2 thora 1/3 intubated and transferred to ICU 1/4 weaning vent x 6 hours    Consults:  PCCM  Procedures:  1/3 ETT>>>  Significant Diagnostic Tests:  Blood culture 12/28 bedside thoracentesis-200 cc 1/2 thoracentesis-820 cc of fluid drained Chest x-ray 09/24/2019-bilateral pleural  effusions, possible left lower lobe infiltrate -ECHO 1/3 LVEF 25-30%, severely decreased LV systolic function. LV grade II diastolic dysfunction. RV severely reduced systolic function. Trivial pericardial effusion , L pleural effusion is seen   1/5 CXR> L sided layering effusion. LLL opacity. ETT 4cm above carina.   Micro Data:  Covid 19 PCR 12/26 >> neg RVP 12/26 >> neg  Procalcitonin 12/26 >> 2.01, down trended to 0.4 with abx BCx2 12/26 >> Pan sensitive Proteus Mirabilis UC 12/26 >> negative Urine legionella 12/26 >>   Urine Pneum Ag 12/26 >> positive  Antimicrobials:  Rocephin 12/26 >> (14 day course) Azithromycin 12/27-28 Vancomycin 12/26 Interim history/subjective:  NAEO Some interval improvement to leukocytosis   Tolerating PSV/CPAP this morning with improved Vt after brief CPT   Objective   Blood pressure (!) 140/59, pulse 60, temperature 97.6 F (36.4 C), temperature source Axillary, resp. rate (!) 7, height 5\' 8"  (1.727 m), weight 74.9 kg, SpO2 100 %.    Vent Mode: PRVC FiO2 (%):  [40 %] 40 % Set Rate:  [14 bmp] 14 bmp Vt Set:  [510 mL] 510 mL PEEP:  [5 cmH20] 5 cmH20 Pressure Support:  [5 cmH20] 5 cmH20 Plateau Pressure:  [13 cmH20-18 cmH20] 14 cmH20   Intake/Output Summary (Last 24 hours) at 09/26/2019 0724 Last data filed at 09/26/2019 0508 Gross per 24 hour  Intake 3588.52 ml  Output 1525 ml  Net 2063.52 ml   Filed Weights   09/24/19 0500 09/25/19 0500 09/26/19 0500  Weight: 60 kg 73.3 kg 74.9 kg    Examination: General: Chronically ill appearing, frail, elderly female. Intubated, NAD  HENT: NCAT. Thinning hair pattern. Pink mm. ETT Secure.  Lungs: RUL Rhonchi. Diminished bibasilar sounds. Moderate thick tan secretions. Symmetrical chest expansion on PSV/CPAP.  Cardiovascular: RRR s1s2 no rgm. Cap refill < 3 seconds  Abdomen: soft round ndnt. Normoactive  Extremities: Decreased muscle mass. Chronic LUE contracture and neck contraction. Third spacing of  BUE BLE  Neuro: awake, alert, calm. Following commands but globally weak  GU: foley  Skin: pale, clean dry, warm. Scattered ecchymosis.   Resolved Hospital Problem list     Assessment & Plan:   Acute hypoxemic and hypercarbic respiratory failure requiring intubation  -intubated 1/3 Pneumococcal PNA Bilateral pleural effusions s/p thora, recurring, transudative -1/4 the patient tolerated PSV/CPAP x 6 hours without fatigue. Decision to remain intubated r/t level of debility and poor strength of secretion clearance  P -AM CXR -WUA/SBT qAM - Pulm hygiene, suctioning, CPT  -Remain intubated due to secretion burden -continue lasix; do not feel that patient will have sustainable benefit from repeat thoras -Continue rocephin for total 14 day course   Proteus mirabilis bacteremia -continue rocephin for total 14 day course -continue to trend WBC, fever curve    Acute metabolic encephalopathy in setting of critical illness, bacteremia, on chronic dementia  -LFTs, ammonia WNL, ruling out hepatic encephalopathy P  -Delirium precautions -Abx as above -correct metabolic derangements PRN  -Minimize sedation, RASS goal 0   Acute on chronic systolic and diastolic heart failure -ECHO 1/3 LVEF 25-30%, severely decreased LV systolic function. LV grade II diastolic dysfunction. RV severely reduced systolic function. Trivial pericardial effusion  -On ECHO, large L pleural effusion is seen  -Prior ECHO is from 2016 with LVEF 55-60%  P -continue gentle diuresis   Sacral decubitus wound  -present on admission, un-stagable  P -WOCN  Hx Dementia Hx CVA -On Namenda, Aricept, seroquel  -Delirium precautions, minimize sedation  -supportive care   Inadequate PO intake Severe protein calorie malnutrition -EN   Best practice:  Diet: Tube feeding Pain/Anxiety/Delirium protocol (if indicated): Precedex, xanax, PRN fent  VAP protocol (if indicated): yes DVT prophylaxis: Subcu  heparin Mobility: Bedrest Code Status: Full code Family Communication: Discussion with husband at bedside 1/4. Pending 1/5   Disposition: Remains in ICU   Labs   CBC: Recent Labs  Lab 09/22/19 0220 09/23/19 0144 09/24/19 0305 09/25/19 0902 09/26/19 0216  WBC 16.5* 14.6* 11.8* 16.3* 12.3*  HGB 10.3* 8.5* 8.7* 9.0* 8.0*  HCT 33.9* 27.4* 28.6* 29.2* 27.1*  MCV 100.3* 96.5 98.3 99.3 99.3  PLT 141* 133* 149* 128* 0000000    Basic Metabolic Panel: Recent Labs  Lab 09/19/19 1318 09/19/19 1800 09/20/19 0906 09/20/19 1648 09/21/19 0234 09/22/19 0220 09/23/19 0144 09/24/19 0305 09/25/19 0902 09/26/19 0216  NA  --   --   --   --  143 144 141 140 140 138  K  --   --   --   --  3.9 4.1 4.1 4.3 4.9 4.2  CL  --   --   --   --  108 108 107 106 106 101  CO2  --   --   --   --  24 26 25 26 27 29   GLUCOSE  --   --   --   --  147* 133* 124* 155* 130* 94  BUN  --   --   --   --  30* 27* 30* 32* 36* 37*  CREATININE  --   --   --   --  0.50 0.47 0.49 0.42* 0.44 0.40*  CALCIUM  --   --   --   --  8.0* 8.4* 7.8* 7.9* 8.4* 8.0*  MG 1.8 1.8 1.8 1.7 1.7  --   --   --   --   --   PHOS 2.0* 2.0* 1.6* 1.7*  --   --   --   --   --   --    GFR: Estimated Creatinine Clearance: 67 mL/min (A) (by C-G formula based on SCr of 0.4 mg/dL (L)). Recent Labs  Lab 09/23/19 0144 09/24/19 0305 09/25/19 0902 09/26/19 0216  WBC 14.6* 11.8* 16.3* 12.3*    Liver Function Tests: Recent Labs  Lab 09/23/19 0144 09/24/19 0305 09/24/19 1034 09/25/19 0902 09/26/19 0216  AST 199* 54* 47* 36 38  ALT 76* 51* 47* 37 39  ALKPHOS 163* 114 104 92 81  BILITOT 0.5 0.6 0.5 0.4 0.5  PROT 5.0* 4.7* 4.8* 4.9* 4.6*  ALBUMIN 2.9* 2.4* 2.6* 2.4* 2.3*   No results for input(s): LIPASE, AMYLASE in the last 168 hours. Recent Labs  Lab 09/24/19 1034 09/25/19 0902  AMMONIA 30 25    ABG    Component Value Date/Time   PHART 7.508 (H) 09/24/2019 1320   PCO2ART 34.1 09/24/2019 1320   PO2ART 195 (H) 09/24/2019 1320    HCO3 26.8 09/24/2019 1320   TCO2 24 08/31/2015 1142   ACIDBASEDEF 0.8 09/16/2019 1622   O2SAT 99.1 09/24/2019 1320     Coagulation Profile: No results for input(s): INR, PROTIME in the last 168 hours.  Cardiac Enzymes: No results for input(s): CKTOTAL, CKMB, CKMBINDEX, TROPONINI in the last 168 hours.  HbA1C: Hgb A1c MFr Bld  Date/Time Value Ref Range Status  09/18/2019 01:30 PM 5.6 4.8 - 5.6 % Final    Comment:    (NOTE) Pre diabetes:          5.7%-6.4% Diabetes:              >6.4% Glycemic control for   <7.0% adults with diabetes   09/01/2015 04:28 AM 5.7 (H) 4.8 - 5.6 % Final    Comment:    (NOTE)         Pre-diabetes: 5.7 - 6.4         Diabetes: >6.4         Glycemic control for adults with diabetes: <7.0     CBG: Recent Labs  Lab 09/25/19 1241 09/25/19 1606 09/25/19 2001 09/25/19 2336 09/26/19 0413  GLUCAP 131* 122* 96 96 85    CRITICAL CARE Performed by: Cristal Generous   Total critical care time: 40 minutes  Critical care time was exclusive of separately billable procedures and treating other patients. Critical care was necessary to treat or prevent imminent or life-threatening deterioration.  Critical care was time spent personally by me on the following activities: development of treatment plan with patient and/or surrogate as well as nursing, discussions with consultants, evaluation of patient's response to treatment, examination of patient, obtaining history from patient or surrogate, ordering and performing treatments and interventions, ordering and review of laboratory studies, ordering and review of radiographic studies, pulse oximetry and re-evaluation of patient's condition.   Eliseo Gum MSN, AGACNP-BC [place] [phone] If no answer, [phone]  09/26/2019, 7:24 AM

## 2019-09-26 NOTE — Plan of Care (Signed)
PCCM Interval Note  I updated family including husband and son (works as a Interior and spatial designer) on patient's current status including mechanical ventilation, volume overload from heart failure and general weakness. Son is aware that his mother is weak and a high risk for aspiration due to her Parkinson's. At this time, he would like to continue aggressive care and would want re-intubation at this time if indicated. If her clinical status changes or worsens, he is open to readdress goals of care.  Continue current management.  Rodman Pickle, M.D. [facility] Pulmonary/Critical Care Medicine 09/26/2019 3:08 PM

## 2019-09-26 NOTE — TOC Progression Note (Signed)
Transition of Care [facility]) - Progression Note    Patient Details  Name: Barbara Flowers MRN: [id number] Date of Birth: 1951-08-26  [facility]) CM/SW Contact  Conchetta Lamia, Marjie Skiff, RN Phone Number: 09/26/2019, 11:15 AM  Clinical Narrative:    TOC is continuing to follow for DC needs. Pt is still vented at this time. Will need SNF at DC. Family wants [facility].    Readmission Risk Interventions Readmission Risk Prevention Plan 09/20/2019  Transportation Screening Complete  Home Care Screening Complete  Some recent data might be hidden

## 2019-09-27 ENCOUNTER — Inpatient Hospital Stay (HOSPITAL_COMMUNITY): Payer: Medicare Other

## 2019-09-27 DIAGNOSIS — I871 Compression of vein: Secondary | ICD-10-CM

## 2019-09-27 DIAGNOSIS — Z9911 Dependence on respirator [ventilator] status: Secondary | ICD-10-CM

## 2019-09-27 LAB — CBC
HCT: 30.3 % — ABNORMAL LOW (ref 36.0–46.0)
Hemoglobin: 9.3 g/dL — ABNORMAL LOW (ref 12.0–15.0)
MCH: 29.5 pg (ref 26.0–34.0)
MCHC: 30.7 g/dL (ref 30.0–36.0)
MCV: 96.2 fL (ref 80.0–100.0)
Platelets: 164 10*3/uL (ref 150–400)
RBC: 3.15 MIL/uL — ABNORMAL LOW (ref 3.87–5.11)
RDW: 14.1 % (ref 11.5–15.5)
WBC: 12.1 10*3/uL — ABNORMAL HIGH (ref 4.0–10.5)
nRBC: 0 % (ref 0.0–0.2)

## 2019-09-27 LAB — GLUCOSE, CAPILLARY
Glucose-Capillary: 97 mg/dL (ref 70–99)
Glucose-Capillary: 77 mg/dL (ref 70–99)
Glucose-Capillary: 78 mg/dL (ref 70–99)
Glucose-Capillary: 87 mg/dL (ref 70–99)
Glucose-Capillary: 88 mg/dL (ref 70–99)
Glucose-Capillary: 65 mg/dL — ABNORMAL LOW (ref 70–99)
Glucose-Capillary: 104 mg/dL — ABNORMAL HIGH (ref 70–99)
Glucose-Capillary: 99 mg/dL (ref 70–99)
Glucose-Capillary: 107 mg/dL — ABNORMAL HIGH (ref 70–99)

## 2019-09-27 LAB — BASIC METABOLIC PANEL
Anion gap: 10 (ref 5–15)
BUN: 41 mg/dL — ABNORMAL HIGH (ref 8–23)
CO2: 29 mmol/L (ref 22–32)
Calcium: 8.2 mg/dL — ABNORMAL LOW (ref 8.9–10.3)
Chloride: 97 mmol/L — ABNORMAL LOW (ref 98–111)
Creatinine, Ser: 0.47 mg/dL (ref 0.44–1.00)
GFR calc Af Amer: >60 mL/min (ref >60)
GFR calc non Af Amer: >60 mL/min (ref >60)
Glucose, Bld: 87 mg/dL (ref 70–99)
Potassium: 4.2 mmol/L (ref 3.5–5.1)
Sodium: 136 mmol/L (ref 135–145)

## 2019-09-27 LAB — PHOSPHORUS: Phosphorus: 3.9 mg/dL (ref 2.5–4.6)

## 2019-09-27 LAB — MAGNESIUM: Magnesium: 2 mg/dL (ref 1.7–2.4)

## 2019-09-27 MED ORDER — DEXTROSE 50 % IV SOLN
1.0000 | Freq: Once | INTRAVENOUS | Status: AC
  Administered 2019-09-27: 50 mL via INTRAVENOUS
  Filled 2019-09-27: qty 50

## 2019-09-27 MED ORDER — MIDODRINE HCL 5 MG PO TABS
5.0000 mg | ORAL_TABLET | Freq: Three times a day (TID) | ORAL | Status: DC
  Administered 2019-09-27 – 2019-10-05 (×25): 5 mg
  Filled 2019-09-27 (×25): qty 1

## 2019-09-27 MED ORDER — DEXTROSE 50 % IV SOLN
INTRAVENOUS | Status: AC
  Filled 2019-09-27: qty 50

## 2019-09-27 NOTE — Progress Notes (Signed)
OT Cancellation Note  Patient Details Name: Barbara Flowers MRN: [id number] DOB: April 21, 1951   Cancelled Treatment:    Reason Eval/Treat Not Completed: Medical issues which prohibited therapy.  Pt remains on vent. OT will sign off at this time.  If pt improves and will benefit from [place], please reorder. Thank you.  [name] 09/27/2019, 2:33 PM  [name], OTR/L Acute Rehabilitation Services 09/27/2019

## 2019-09-27 NOTE — Progress Notes (Signed)
Blood sugar 65, administered 50 mL of D50, blood sugar recheck 104. Will continue to monitor at this time.

## 2019-09-27 NOTE — Progress Notes (Addendum)
NAME:  Barbara Flowers, MRN:  [id number], DOB:  1951-06-19, LOS: 73 ADMISSION DATE:  09/16/2019, CONSULTATION DATE:  12/26  REFERRING MD:  Earlie Counts, CHIEF COMPLAINT: Shortness of breath, altered mental status  Brief History   57 yowf never smoker with h/o breast ca s/p bone marrow transplant early 1990s in [place] and cancer free s recent rx but problems with sundowning/ dementia and w/c bound x 3 years p cva acutely worse x one week PTA with cough/ congestion rx over the phone by PCP since 12/21 and gradually worse x 2 days PTA with poor po intake, ams and weakness with mostly mucoid sputum > to ER pm 12/26 with extensive L infiltrate and L effusion with hypoxemia corrected on non rebreathing and PCCM service asked to admit. Signed off to the hospitalist service Was asked to see her again today for altered mental status, hypoxemia, shortness of breath Post thoracentesis for about 820 cc of fluid  Past Medical History   Past Medical History:  Diagnosis Date  . Anemia   . Anemia   . Anxiety disorder   . Blood transfusion without reported diagnosis   . Brain cancer [facility])    s/p sterotactic radio surgery  . Brain tumor ([place])   . Breast cancer ([place])   . Cancer ([place]) 1990   L breast  . H/O bone marrow transplant ([place])   . Hypothyroidism   . IBS (irritable bowel syndrome)   . MDD (major depressive disorder)   . Memory loss   . Obesity   . Orthostatic hypotension   . Seizure ([place])   . Seizures ([place])   . Stroke [facility]) 2005   hemorrhagic  . Stroke ([place])   . Subdural hematoma ([place])   . Upper brachial plexus paralysis syndrome   . Urinary incontinence    Significant Hospital Events   12/28 thora 1/2 thora 1/3 intubated and transferred to ICU 1/4 weaning vent x 6 hours    Consults:  PCCM  Procedures:  1/3 ETT>>>  Significant Diagnostic Tests:  Blood culture 12/28 bedside thoracentesis-200 cc 1/2 thoracentesis-820 cc of fluid drained Chest x-ray 09/24/2019-bilateral pleural  effusions, possible left lower lobe infiltrate -ECHO 1/3 LVEF 25-30%, severely decreased LV systolic function. LV grade II diastolic dysfunction. RV severely reduced systolic function. Trivial pericardial effusion , L pleural effusion is seen   1/6 CXR> dense left lower lobe opacity silhouetting the left hemidiaphragm  1/6 upper extremity ultrasounds-no DVTs  Micro Data:  Covid 19 PCR 12/26 >> neg RVP 12/26 >> neg  Procalcitonin 12/26 >> 2.01, down trended to 0.4 with abx BCx2 12/26 >> Pan sensitive Proteus Mirabilis UC 12/26 >> negative Urine legionella 12/26 >>   Urine Pneum Ag 12/26 >> positive  Antimicrobials:  Rocephin 12/26 >> (14 day course) Azithromycin 12/27-28 Vancomycin 12/26 Interim history/subjective:  Failed pressure support CPAP trial due to bradypnea and low tidal volumes.  Objective   Blood pressure (!) 132/45, pulse 70, temperature 97.6 F (36.4 C), temperature source Axillary, resp. rate 18, height 5\' 8"  (1.727 m), weight 75.3 kg, SpO2 100 %.    Vent Mode: CPAP;PSV FiO2 (%):  [30 %] 30 % Set Rate:  [14 bmp] 14 bmp Vt Set:  [510 mL] 510 mL PEEP:  [5 cmH20] 5 cmH20 Pressure Support:  [5 cmH20] 5 cmH20 Plateau Pressure:  [14 cmH20-17 cmH20] 14 cmH20   Intake/Output Summary (Last 24 hours) at 09/27/2019 1118 Last data filed at 09/27/2019 0800 Gross per 24 hour  Intake 1549.46 ml  Output 2700 ml  Net -1150.54 ml   Filed Weights   09/25/19 0500 09/26/19 0500 09/27/19 0500  Weight: 73.3 kg 74.9 kg 75.3 kg    Examination: General: Chronically ill-appearing woman lying in bed HENT: [place]/AT, eyes anicteric Lungs: Thick white secretions from ET tube, rhonchi cleared with suctioning.  Reduced breath sounds in the left base. Cardiovascular: Tachycardic, regular rhythm Abdomen: Soft, nontender, nondistended Extremities: Anasarca of upper extremities, no significant edema of her lower extremities.  No clubbing or cyanosis. Neuro: Arouses to physical stimulation,  answer yes/no questions, sluggish response, moving left upper extremity spontaneously, not right.  Globally weak. GU: Foley draining clear yellow urine Skin: Pallor, dilated veins of the right side of the chest.  Indurated thickened skin around left breast.  Resolved Hospital Problem list     Assessment & Plan:   Acute hypoxemic and hypercarbic respiratory failure requiring intubation on 1/3 Pneumococcal PNA Bilateral pleural effusions s/p thora, recurring, transudative -1/4 the patient tolerated PSV/CPAP x 6 hours without fatigue. Decision to remain intubated r/t level of debility and poor strength of secretion clearance  P: -Continue low tidal volume inhalation, 6 to 8 cc/kg ideal body weight.  Goal plateau less than 30 and driving pressure less than 15. -Data prevention protocol -Adding chest PT to assist with clearing secretions.  I think her secretions are too voluminous at this time for her to tolerate extubation given her weakness. -Respiratory culture today -Continue diuresis -Continue ceftriaxone  Proteus mirabilis bacteremia -Continue ceftriaxone  Acute metabolic encephalopathy in setting of critical illness, bacteremia, on chronic dementia  -LFTs, ammonia WNL P:  -Minimize sedation as much as possible; fentanyl as needed for comfort.  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 LV grade II diastolic dysfunction. RV severely reduced systolic function. Trivial pericardial effusion. -On ECHO, large L pleural effusion is seen  -Prior ECHO is from 2016 with LVEF 55-60%  -Tachycardic today, unclear if sinus versus SVT ~140 this morning P: -Diuresis -Titrate off midodrine-decrease to 5 mg 3 times daily today.  Hopefully off tomorrow. -Would benefit from guideline directed heart  failure therapy, but I am concerned that she hemodynamically may not tolerate this at this point.  Will titrate off midodrine, then consider starting ARB.  Sacral decubitus wound  -present on admission, un-stagable  P -Wound care  Hx Dementia Hx CVA  -Delirium precautions, minimizing sedation as much as possible, continue PTA medications -Avoiding as needed benzos -Very poor long-term prognosis due to concern for repeat pneumonias and progressive weakness from such a severe infection.  Inadequate PO intake Severe protein calorie malnutrition -Continue tube feeds  Best practice:  Diet: Tube feeding Pain/Anxiety/Delirium protocol (if indicated):  PTA xanax, PRN fent  VAP protocol (if indicated): yes DVT prophylaxis: Subcu heparin Mobility: Bedrest Code Status: Full code Family Communication: Husband, son lives in [place] Disposition: ICU  Labs   CBC: Recent Labs  Lab 09/23/19 0144 09/24/19 0305 09/25/19 0902 09/26/19 0216 09/27/19 0212  WBC 14.6* 11.8* 16.3* 12.3* 12.1*  HGB 8.5* 8.7* 9.0* 8.0* 9.3*  HCT 27.4* 28.6* 29.2* 27.1* 30.3*  MCV 96.5 98.3 99.3 99.3 96.2  PLT 133* 149* 128* 195 [id number]    Basic Metabolic Panel: Recent Labs  Lab 09/20/19 1648 09/21/19 0234 09/23/19 0144 09/24/19 0305 09/25/19 0902 09/26/19 0216 09/27/19 0212  NA  --  143 141 140  140 138 136  K  --  3.9 4.1 4.3 4.9 4.2 4.2  CL  --  108 107 106 106 101 97*  CO2  --  24 25 26 27 29 29   GLUCOSE  --  147* 124* 155* 130* 94 87  BUN  --  30* 30* 32* 36* 37* 41*  CREATININE  --  0.50 0.49 0.42* 0.44 0.40* 0.47  CALCIUM  --  8.0* 7.8* 7.9* 8.4* 8.0* 8.2*  MG 1.7 1.7  --   --   --   --  2.0  PHOS 1.7*  --   --   --   --   --  3.9   GFR: Estimated Creatinine Clearance: 67 mL/min (by C-G formula based on SCr of 0.47 mg/dL). Recent Labs  Lab 09/24/19 0305 09/25/19 0902 09/26/19 0216 09/27/19 0212  WBC 11.8* 16.3* 12.3* 12.1*    Liver Function Tests: Recent Labs  Lab 09/23/19 0144  09/24/19 0305 09/24/19 1034 09/25/19 0902 09/26/19 0216  AST 199* 54* 47* 36 38  ALT 76* 51* 47* 37 39  ALKPHOS 163* 114 104 92 81  BILITOT 0.5 0.6 0.5 0.4 0.5  PROT 5.0* 4.7* 4.8* 4.9* 4.6*  ALBUMIN 2.9* 2.4* 2.6* 2.4* 2.3*   No results for input(s): LIPASE, AMYLASE in the last 168 hours. Recent Labs  Lab 09/24/19 1034 09/25/19 0902  AMMONIA 30 25    ABG    Component Value Date/Time   PHART 7.508 (H) 09/24/2019 1320   PCO2ART 34.1 09/24/2019 1320   PO2ART 195 (H) 09/24/2019 1320   HCO3 26.8 09/24/2019 1320   TCO2 24 08/31/2015 1142   ACIDBASEDEF 0.8 09/16/2019 1622   O2SAT 99.1 09/24/2019 1320     Coagulation Profile: No results for input(s): INR, PROTIME in the last 168 hours.  Cardiac Enzymes: No results for input(s): CKTOTAL, CKMB, CKMBINDEX, TROPONINI in the last 168 hours.  HbA1C: Hgb A1c MFr Bld  Date/Time Value Ref Range Status  09/18/2019 01:30 PM 5.6 4.8 - 5.6 % Final    Comment:    (NOTE) Pre diabetes:          5.7%-6.4% Diabetes:              >6.4% Glycemic control for   <7.0% adults with diabetes   09/01/2015 04:28 AM 5.7 (H) 4.8 - 5.6 % Final    Comment:    (NOTE)         Pre-diabetes: 5.7 - 6.4         Diabetes: >6.4         Glycemic control for adults with diabetes: <7.0     CBG: Recent Labs  Lab 09/26/19 2320 09/27/19 0615 09/27/19 0641 09/27/19 0704 09/27/19 0734  GLUCAP 94 97 77 78 87    This patient is critically ill with multiple organ system failure which requires frequent high complexity decision making, assessment, support, evaluation, and titration of therapies. This was completed through the application of advanced monitoring technologies and extensive interpretation of multiple databases. During this encounter critical care time was devoted to patient care services described in this note for 45 minutes.    Son updated via phone and husband at bedside. Julian Hy, DO 09/27/19 2:36 PM [facility]

## 2019-09-27 NOTE — Evaluation (Signed)
SLP Cancellation Note  Patient Details Name: Barbara Flowers MRN: [id number] DOB: 1951-01-16   Cancelled treatment:        Pt remains intubated. Kathleen Lime, MS [facility] SLP Acute Rehab Services Office [phone]    [name] 09/27/2019, 7:04 AM

## 2019-09-27 NOTE — Progress Notes (Signed)
Collected ordered sputum and provided to RN.

## 2019-09-27 NOTE — Progress Notes (Signed)
Upper extremity venous has been completed.   Preliminary results in CV Proc.   Abram Sander 09/27/2019 9:32 AM

## 2019-09-27 NOTE — Progress Notes (Signed)
Pt experienced run of SVT, flipped back into a-fib in the 70's before EKG could be obtained. Will notify MD.

## 2019-09-28 ENCOUNTER — Inpatient Hospital Stay: Payer: Self-pay

## 2019-09-28 DIAGNOSIS — R601 Generalized edema: Secondary | ICD-10-CM

## 2019-09-28 DIAGNOSIS — G9341 Metabolic encephalopathy: Secondary | ICD-10-CM

## 2019-09-28 DIAGNOSIS — I4891 Unspecified atrial fibrillation: Secondary | ICD-10-CM

## 2019-09-28 LAB — GLUCOSE, CAPILLARY
Glucose-Capillary: 92 mg/dL (ref 70–99)
Glucose-Capillary: 94 mg/dL (ref 70–99)
Glucose-Capillary: 119 mg/dL — ABNORMAL HIGH (ref 70–99)
Glucose-Capillary: 92 mg/dL (ref 70–99)
Glucose-Capillary: 94 mg/dL (ref 70–99)
Glucose-Capillary: 99 mg/dL (ref 70–99)

## 2019-09-28 LAB — CBC
HCT: 28.4 % — ABNORMAL LOW (ref 36.0–46.0)
Hemoglobin: 9.2 g/dL — ABNORMAL LOW (ref 12.0–15.0)
MCH: 29.6 pg (ref 26.0–34.0)
MCHC: 32.4 g/dL (ref 30.0–36.0)
MCV: 91.3 fL (ref 80.0–100.0)
Platelets: 248 10*3/uL (ref 150–400)
RBC: 3.11 MIL/uL — ABNORMAL LOW (ref 3.87–5.11)
RDW: 14.4 % (ref 11.5–15.5)
WBC: 11.5 10*3/uL — ABNORMAL HIGH (ref 4.0–10.5)
nRBC: 0 % (ref 0.0–0.2)

## 2019-09-28 LAB — BASIC METABOLIC PANEL
Anion gap: 14 (ref 5–15)
BUN: 41 mg/dL — ABNORMAL HIGH (ref 8–23)
CO2: 29 mmol/L (ref 22–32)
Calcium: 8.1 mg/dL — ABNORMAL LOW (ref 8.9–10.3)
Chloride: 91 mmol/L — ABNORMAL LOW (ref 98–111)
Creatinine, Ser: 0.45 mg/dL (ref 0.44–1.00)
GFR calc Af Amer: >60 mL/min (ref >60)
GFR calc non Af Amer: >60 mL/min (ref >60)
Glucose, Bld: 112 mg/dL — ABNORMAL HIGH (ref 70–99)
Potassium: 3.7 mmol/L (ref 3.5–5.1)
Sodium: 134 mmol/L — ABNORMAL LOW (ref 135–145)

## 2019-09-28 LAB — CULTURE, BODY FLUID W GRAM STAIN -BOTTLE: Culture: NO GROWTH

## 2019-09-28 LAB — MAGNESIUM: Magnesium: 1.8 mg/dL (ref 1.7–2.4)

## 2019-09-28 LAB — PHOSPHORUS: Phosphorus: 4.2 mg/dL (ref 2.5–4.6)

## 2019-09-28 MED ORDER — VANCOMYCIN HCL 1750 MG/350ML IV SOLN
1750.0000 mg | Freq: Once | INTRAVENOUS | Status: AC
  Administered 2019-09-28: 1750 mg via INTRAVENOUS
  Filled 2019-09-28: qty 350

## 2019-09-28 MED ORDER — VANCOMYCIN HCL 1500 MG/300ML IV SOLN
1500.0000 mg | INTRAVENOUS | Status: DC
  Administered 2019-09-29 – 2019-10-01 (×3): 1500 mg via INTRAVENOUS
  Filled 2019-09-28 (×4): qty 300

## 2019-09-28 MED ORDER — METOPROLOL TARTRATE 5 MG/5ML IV SOLN
2.5000 mg | Freq: Four times a day (QID) | INTRAVENOUS | Status: DC | PRN
  Administered 2019-09-30 – 2019-10-03 (×2): 2.5 mg via INTRAVENOUS
  Filled 2019-09-28 (×3): qty 5

## 2019-09-28 MED ORDER — MAGNESIUM SULFATE 2 GM/50ML IV SOLN
2.0000 g | Freq: Once | INTRAVENOUS | Status: AC
  Administered 2019-09-28: 2 g via INTRAVENOUS
  Filled 2019-09-28: qty 50

## 2019-09-28 MED ORDER — POTASSIUM CHLORIDE 20 MEQ/15ML (10%) PO SOLN
40.0000 meq | ORAL | Status: AC
  Administered 2019-09-28 (×2): 40 meq via ORAL
  Filled 2019-09-28 (×2): qty 30

## 2019-09-28 NOTE — Progress Notes (Addendum)
Attempted to place PICC on right arm, Cath unable to drop in SVC.Pt had history left clavicular resection.Had history of right chest PAC. Rn aware  will notify MD.

## 2019-09-28 NOTE — Progress Notes (Signed)
Pharmacy Antibiotic Note  Barbara Flowers is a 69 y.o. female admitted on 09/16/2019 with Staph growing in sputum culture.  Pharmacy has been consulted for vancomycin dosing.  Plan: Vanc 1750mg  x 1 then 1500mg  IV q24 - goal AUC 400-550  Height: 5\' 8"  (172.7 cm) Weight: 162 lb 0.6 oz (73.5 kg) IBW/kg (Calculated) : 63.9  Temp (24hrs), Avg:98 F (36.7 C), Min:97.4 F (36.3 C), Max:98.9 F (37.2 C)  Recent Labs  Lab 09/23/19 0144 09/24/19 0305 09/25/19 0902 09/26/19 0216 09/27/19 0212  WBC 14.6* 11.8* 16.3* 12.3* 12.1*  CREATININE 0.49 0.42* 0.44 0.40* 0.47    Estimated Creatinine Clearance: 67 mL/min (by C-G formula based on SCr of 0.47 mg/dL).    Allergies  Allergen Reactions  . Gabapentin Other (See Comments)    seizure  . Tramadol Other (See Comments)    Brings on Seizures      Thank you for allowing pharmacy to be a part of this patient's care.  Kara Mead 09/28/2019 12:24 PM

## 2019-09-28 NOTE — Progress Notes (Signed)
NAME:  Barbara Flowers, MRN:  [id number], DOB:  January 08, 1951, LOS: 12 ADMISSION DATE:  09/16/2019, CONSULTATION DATE:  12/26  REFERRING MD:  Earlie Counts, CHIEF COMPLAINT: Shortness of breath, altered mental status  Brief History   73 yowf never smoker with h/o breast ca s/p bone marrow transplant early 1990s in [place] and cancer free s recent rx but problems with sundowning/ dementia and w/c bound x 3 years p cva acutely worse x one week PTA with cough/ congestion rx over the phone by PCP since 12/21 and gradually worse x 2 days PTA with poor po intake, ams and weakness with mostly mucoid sputum > to ER pm 12/26 with extensive L infiltrate and L effusion with hypoxemia corrected on non rebreathing and PCCM service asked to admit. Signed off to the hospitalist service Was asked to see her again today for altered mental status, hypoxemia, shortness of breath Post thoracentesis for about 820 cc of fluid  Past Medical History   Past Medical History:  Diagnosis Date  . Anemia   . Anemia   . Anxiety disorder   . Blood transfusion without reported diagnosis   . Brain cancer [facility])    s/p sterotactic radio surgery  . Brain tumor ([place])   . Breast cancer ([place])   . Cancer ([place]) 1990   L breast  . H/O bone marrow transplant ([place])   . Hypothyroidism   . IBS (irritable bowel syndrome)   . MDD (major depressive disorder)   . Memory loss   . Obesity   . Orthostatic hypotension   . Seizure ([place])   . Seizures ([place])   . Stroke [facility]) 2005   hemorrhagic  . Stroke ([place])   . Subdural hematoma ([place])   . Upper brachial plexus paralysis syndrome   . Urinary incontinence    Significant Hospital Events   12/28 thora 1/2 thora 1/3 intubated and transferred to ICU 1/4 weaning vent x 6 hours    Consults:  PCCM  Procedures:  1/3 ETT>>>  Significant Diagnostic Tests:  Blood culture 12/28 bedside thoracentesis-200 cc 1/2 thoracentesis-820 cc of fluid drained Chest x-ray 09/24/2019-bilateral pleural  effusions, possible left lower lobe infiltrate -ECHO 1/3 LVEF 25-30%, severely decreased LV systolic function. LV grade II diastolic dysfunction. RV severely reduced systolic function. Trivial pericardial effusion , L pleural effusion is seen   1/6 CXR> dense left lower lobe opacity silhouetting the left hemidiaphragm  1/6 upper extremity ultrasounds-no DVTs  Micro Data:  Covid 19 PCR 12/26 >> neg RVP 12/26 >> neg  Procalcitonin 12/26 >> 2.01, down trended to 0.4 with abx BCx2 12/26 >> Pan sensitive Proteus Mirabilis UC 12/26 >> negative Urine legionella 12/26 >>   Urine Pneum Ag 12/26 >> positive Respiratory culture 1/6>> Few staph aureus  Antimicrobials:  Rocephin 12/26 >> (14 day course) Azithromycin 12/27-28 Vancomycin 12/26 Interim history/subjective:  A. fib overnight requiring labetalol x1 dose Still having significant ETT secretions. I/O: -3.3L  Objective   Blood pressure (!) 114/46, pulse 66, temperature 98.9 F (37.2 C), temperature source Oral, resp. rate 17, height 5\' 8"  (1.727 m), weight 73.5 kg, SpO2 90 %.    Vent Mode: PRVC FiO2 (%):  [30 %-40 %] 40 % Set Rate:  [14 bmp] 14 bmp Vt Set:  [510 mL] 510 mL PEEP:  [5 cmH20] 5 cmH20 Plateau Pressure:  [15 cmH20-19 cmH20] 19 cmH20   Intake/Output Summary (Last 24 hours) at 09/28/2019 1152 Last data filed at 09/28/2019 0900 Gross per 24 hour  Intake 1450.94  ml  Output 5005 ml  Net -3554.06 ml   Filed Weights   09/26/19 0500 09/27/19 0500 09/28/19 0500  Weight: 74.9 kg 75.3 kg 73.5 kg    Examination: General: Frail, chronically ill-appearing woman lying in bed in no acute distress HENT: [place]/AT, eyes anicteric Lungs: Thick white secretions from ET tube.  Rhonchi partially cleared with suctioning. Cardiovascular: Regular rate and rhythm, no murmurs Abdomen: Soft, nontender, nondistended Extremities: Anasarca of upper extremities, pitting edema in the feet, but less in the lower extremities.  No clubbing or  cyanosis. Neuro: Slowly arouses to verbal stimulation.  Requires significant encouragement to follow commands.  Not moving her toes on her right foot, but following commands and every other extremity. GU: Foley draining clear yellow urine Skin: Pallor, dilated superficial veins on the right side of the chest.  Weeping from edematous arms.  Resolved Hospital Problem list     Assessment & Plan:   Acute hypoxemic and hypercarbic respiratory failure requiring intubation on 1/3 Pneumococcal PNA.  Now staph aureus on respiratory culture. Bilateral pleural effusions s/p thora, recurring, transudative -1/4 the patient tolerated PSV/CPAP x 6 hours without fatigue. Decision to remain intubated r/t level of debility and poor strength of secretion clearance  P: -Continue low tidal volume ventilation, 6 to 8 cc/kg ideal body weight.  Goal plateau less than 30 and driving pressure less than 15 -Daily SAT SBT -VAP prevention protocol -Continue chest PT to assist with clearing secretions.  The still are likely to significant for successful extubation. -Continue to follow respiratory culture.  Consult pharmacy for vancomycin dosing until susceptibilities return -Continue diuresis -Continue ceftriaxone  Proteus mirabilis bacteremia -Continue ceftriaxone  Acute metabolic encephalopathy in setting of critical illness, bacteremia, on chronic dementia  -LFTs, ammonia WNL P:  -Minimizing sedation is much as possible using fentanyl as needed.  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 LV grade II diastolic dysfunction. RV severely reduced systolic function. Trivial pericardial effusion. -On ECHO, large L pleural effusion is seen  -Prior ECHO is from 2016 with LVEF 55-60%  -Tachycardic today, unclear if sinus versus SVT  ~140 this morning P: -Continue diuresis -Continue midodrine 5 mg 3 times daily.  Working to titrate off given her heart failure, but she is not tolerating this. -Given low blood pressures, she unfortunately will not tolerate guideline directed heart failure therapy -Metoprolol as needed for A. Fib w/  RVR  Sacral decubitus wound  -present on admission, un-stagable  P -Wound care  Hx Dementia Hx CVA  -Delirium precautions, minimizing sedation as much as possible -Continuing only PTA benzodiazepines -Very poor long-term prognosis due to concern for aspiration and future pneumonias.  Inadequate PO intake Severe protein calorie malnutrition -Continue enteral feeds  Best practice:  Diet: Tube feeding Pain/Anxiety/Delirium protocol (if indicated):  PTA xanax, PRN fent  VAP protocol (if indicated): yes DVT prophylaxis: Subcu heparin Mobility: Bedrest Code Status: Full code Family Communication: Husband updated at bedside, son lives in [place]) Disposition: ICU  Labs   CBC: Recent Labs  Lab 09/23/19 0144 09/24/19 0305 09/25/19 0902 09/26/19 0216 09/27/19 0212  WBC 14.6* 11.8* 16.3* 12.3* 12.1*  HGB 8.5* 8.7* 9.0* 8.0* 9.3*  HCT 27.4* 28.6* 29.2* 27.1* 30.3*  MCV 96.5 98.3 99.3 99.3 96.2  PLT 133* 149* 128* 195 [id number]    Basic Metabolic Panel: Recent Labs  Lab 09/23/19 0144 09/24/19 0305 09/25/19 0902 09/26/19 0216 09/27/19 0212  NA 141 140 140 138 136  K 4.1 4.3 4.9 4.2 4.2  CL 107 106 106 101 97*  CO2 25 26 27 29 29   GLUCOSE 124* 155* 130* 94 87  BUN 30* 32* 36* 37* 41*  CREATININE 0.49 0.42* 0.44 0.40* 0.47  CALCIUM 7.8* 7.9* 8.4* 8.0* 8.2*  MG  --   --   --   --  2.0  PHOS  --   --   --   --  3.9   GFR: Estimated Creatinine Clearance: 67 mL/min (by C-G formula based on SCr of 0.47 mg/dL). Recent Labs  Lab 09/24/19 0305 09/25/19 0902 09/26/19 0216 09/27/19 0212  WBC 11.8* 16.3* 12.3* 12.1*    Liver Function Tests: Recent Labs  Lab  09/23/19 0144 09/24/19 0305 09/24/19 1034 09/25/19 0902 09/26/19 0216  AST 199* 54* 47* 36 38  ALT 76* 51* 47* 37 39  ALKPHOS 163* 114 104 92 81  BILITOT 0.5 0.6 0.5 0.4 0.5  PROT 5.0* 4.7* 4.8* 4.9* 4.6*  ALBUMIN 2.9* 2.4* 2.6* 2.4* 2.3*   No results for input(s): LIPASE, AMYLASE in the last 168 hours. Recent Labs  Lab 09/24/19 1034 09/25/19 0902  AMMONIA 30 25    ABG    Component Value Date/Time   PHART 7.508 (H) 09/24/2019 1320   PCO2ART 34.1 09/24/2019 1320   PO2ART 195 (H) 09/24/2019 1320   HCO3 26.8 09/24/2019 1320   TCO2 24 08/31/2015 1142   ACIDBASEDEF 0.8 09/16/2019 1622   O2SAT 99.1 09/24/2019 1320     Coagulation Profile: No results for input(s): INR, PROTIME in the last 168 hours.  Cardiac Enzymes: No results for input(s): CKTOTAL, CKMB, CKMBINDEX, TROPONINI in the last 168 hours.  HbA1C: Hgb A1c MFr Bld  Date/Time Value Ref Range Status  09/18/2019 01:30 PM 5.6 4.8 - 5.6 % Final    Comment:    (NOTE) Pre diabetes:          5.7%-6.4% Diabetes:              >6.4% Glycemic control for   <7.0% adults with diabetes   09/01/2015 04:28 AM 5.7 (H) 4.8 - 5.6 % Final    Comment:    (NOTE)         Pre-diabetes: 5.7 - 6.4         Diabetes: >6.4         Glycemic control for adults with diabetes: <7.0     CBG: Recent Labs  Lab 09/27/19 1939 09/27/19 2318 09/28/19 0323 09/28/19 0747 09/28/19 1145  GLUCAP 99 107* 92 94 119*   This patient is critically ill with multiple organ system failure which requires frequent high complexity decision making, assessment, support, evaluation, and titration of therapies. This was completed through the application of advanced monitoring technologies and extensive interpretation of multiple databases. During this encounter critical care time was devoted to patient care services described in this note for 55 minutes.  Julian Hy, DO 09/28/19 12:21 PM [facility]

## 2019-09-28 NOTE — Progress Notes (Signed)
Pt experienced A-fib sustaining above 130-150 bpm. 5 mg labetalol given IV. HR back to 60-70 bpm. Will continue to monitor.

## 2019-09-28 NOTE — Evaluation (Signed)
SLP Cancellation Note  Patient Details Name: Barbara Flowers MRN: [id number] DOB: April 28, 1951   Cancelled treatment:       Reason Eval/Treat Not Completed: Medical issues which prohibited therapy   Pt remains intubated, will sign off.  Please reconsult when pt is ready for po/eval. Thanks.   Kathleen Lime, MS [facility] SLP Acute Rehab Services Office [phone]   [name] 09/28/2019, 9:28 AM

## 2019-09-29 ENCOUNTER — Inpatient Hospital Stay (HOSPITAL_COMMUNITY): Payer: Medicare Other

## 2019-09-29 DIAGNOSIS — Z22322 Carrier or suspected carrier of Methicillin resistant Staphylococcus aureus: Secondary | ICD-10-CM

## 2019-09-29 LAB — COMPREHENSIVE METABOLIC PANEL
ALT: 29 U/L (ref 0–44)
AST: 29 U/L (ref 15–41)
Albumin: 2.3 g/dL — ABNORMAL LOW (ref 3.5–5.0)
Alkaline Phosphatase: 72 U/L (ref 38–126)
Anion gap: 10 (ref 5–15)
BUN: 44 mg/dL — ABNORMAL HIGH (ref 8–23)
CO2: 30 mmol/L (ref 22–32)
Calcium: 7.9 mg/dL — ABNORMAL LOW (ref 8.9–10.3)
Chloride: 92 mmol/L — ABNORMAL LOW (ref 98–111)
Creatinine, Ser: 0.52 mg/dL (ref 0.44–1.00)
GFR calc Af Amer: >60 mL/min (ref >60)
GFR calc non Af Amer: >60 mL/min (ref >60)
Glucose, Bld: 120 mg/dL — ABNORMAL HIGH (ref 70–99)
Potassium: 5 mmol/L (ref 3.5–5.1)
Sodium: 132 mmol/L — ABNORMAL LOW (ref 135–145)
Total Bilirubin: 0.7 mg/dL (ref 0.3–1.2)
Total Protein: 5.3 g/dL — ABNORMAL LOW (ref 6.5–8.1)

## 2019-09-29 LAB — CBC
HCT: 26.7 % — ABNORMAL LOW (ref 36.0–46.0)
Hemoglobin: 8.5 g/dL — ABNORMAL LOW (ref 12.0–15.0)
MCH: 29.8 pg (ref 26.0–34.0)
MCHC: 31.8 g/dL (ref 30.0–36.0)
MCV: 93.7 fL (ref 80.0–100.0)
Platelets: 259 10*3/uL (ref 150–400)
RBC: 2.85 MIL/uL — ABNORMAL LOW (ref 3.87–5.11)
RDW: 14.6 % (ref 11.5–15.5)
WBC: 10.3 10*3/uL (ref 4.0–10.5)
nRBC: 0 % (ref 0.0–0.2)

## 2019-09-29 LAB — GLUCOSE, CAPILLARY
Glucose-Capillary: 101 mg/dL — ABNORMAL HIGH (ref 70–99)
Glucose-Capillary: 108 mg/dL — ABNORMAL HIGH (ref 70–99)
Glucose-Capillary: 109 mg/dL — ABNORMAL HIGH (ref 70–99)
Glucose-Capillary: 112 mg/dL — ABNORMAL HIGH (ref 70–99)
Glucose-Capillary: 82 mg/dL (ref 70–99)
Glucose-Capillary: 95 mg/dL (ref 70–99)

## 2019-09-29 LAB — CULTURE, RESPIRATORY W GRAM STAIN

## 2019-09-29 LAB — PHOSPHORUS: Phosphorus: 3.8 mg/dL (ref 2.5–4.6)

## 2019-09-29 LAB — MAGNESIUM: Magnesium: 2.4 mg/dL (ref 1.7–2.4)

## 2019-09-29 MED ORDER — FREE WATER
100.0000 mL | Freq: Two times a day (BID) | Status: DC
  Administered 2019-09-29 – 2019-10-02 (×6): 100 mL

## 2019-09-29 MED ORDER — SODIUM CHLORIDE 0.9 % IV SOLN: INTRAVENOUS | Status: DC

## 2019-09-29 NOTE — Progress Notes (Signed)
NAME:  Barbara Flowers, MRN:  [id number], DOB:  Oct 13, 1950, LOS: 66 ADMISSION DATE:  09/16/2019, CONSULTATION DATE:  12/26  REFERRING MD:  Earlie Counts, CHIEF COMPLAINT: Shortness of breath, altered mental status  Brief History   52 yowf never smoker with h/o breast ca s/p bone marrow transplant early 1990s in [place] and cancer free s recent rx but problems with sundowning/ dementia and w/c bound x 3 years p cva acutely worse x one week PTA with cough/ congestion rx over the phone by PCP since 12/21 and gradually worse x 2 days PTA with poor po intake, ams and weakness with mostly mucoid sputum > to ER pm 12/26 with extensive L infiltrate and L effusion with hypoxemia corrected on non rebreathing and PCCM service asked to admit. Signed off to the hospitalist service Was asked to see her again today for altered mental status, hypoxemia, shortness of breath Post thoracentesis for about 820 cc of fluid  Past Medical History   Past Medical History:  Diagnosis Date  . Anemia   . Anemia   . Anxiety disorder   . Blood transfusion without reported diagnosis   . Brain cancer [facility])    s/p sterotactic radio surgery  . Brain tumor ([place])   . Breast cancer ([place])   . Cancer ([place]) 1990   L breast  . H/O bone marrow transplant ([place])   . Hypothyroidism   . IBS (irritable bowel syndrome)   . MDD (major depressive disorder)   . Memory loss   . Obesity   . Orthostatic hypotension   . Seizure ([place])   . Seizures ([place])   . Stroke [facility]) 2005   hemorrhagic  . Stroke ([place])   . Subdural hematoma ([place])   . Upper brachial plexus paralysis syndrome   . Urinary incontinence    Significant Hospital Events   12/28 thora 1/2 thora 1/3 intubated and transferred to ICU 1/4 weaning vent x 6 hours  1/7 weaning OK but copious secretions  Consults:  PCCM  Procedures:  1/3 ETT>>>  Significant Diagnostic Tests:  Blood culture 12/28 bedside thoracentesis-200 cc 1/2 thoracentesis-820 cc of fluid drained Chest  x-ray 09/24/2019-bilateral pleural effusions, possible left lower lobe infiltrate -ECHO 1/3 LVEF 25-30%, severely decreased LV systolic function. LV grade II diastolic dysfunction. RV severely reduced systolic function. Trivial pericardial effusion , L pleural effusion is seen   1/6 CXR> dense left lower lobe opacity silhouetting the left hemidiaphragm   1/6 upper extremity ultrasounds-no DVTs  Micro Data:  Covid 19 PCR 12/26 >> neg RVP 12/26 >> neg  Procalcitonin 12/26 >> 2.01, down trended to 0.4 with abx BCx2 12/26 >> Pan sensitive Proteus Mirabilis UC 12/26 >> negative Urine legionella 12/26 >>   Urine Pneum Ag 12/26 >> positive Respiratory culture 1/6>> Few staph aureus  Antimicrobials:  Rocephin 12/26 >> (14 day course) Azithromycin 12/27-28 Vancomycin 12/26  Interim history/subjective:  No acute events overnight. Started on NS @ 64mL/Hr due to hyponatremia.   Objective   Blood pressure (!) 104/41, pulse 68, temperature 98.7 F (37.1 C), temperature source Axillary, resp. rate 15, height 5\' 8"  (1.727 m), weight 72.3 kg, SpO2 100 %.    Vent Mode: PRVC FiO2 (%):  [40 %-60 %] 60 % Set Rate:  [14 bmp] 14 bmp Vt Set:  [510 mL] 510 mL PEEP:  [5 cmH20] 5 cmH20 Plateau Pressure:  [16 cmH20-19 cmH20] 16 cmH20   Intake/Output Summary (Last 24 hours) at 09/29/2019 0831 Last data filed at 09/29/2019 [id number] Gross  per 24 hour  Intake 2871.23 ml  Output 5125 ml  Net -2253.77 ml   Filed Weights   09/27/19 0500 09/28/19 0500 09/29/19 0500  Weight: 75.3 kg 73.5 kg 72.3 kg    Examination:  General: Frail elderly female in NAD on vent.  Lungs: some rhonchi bilaterally. Secretions seem somewhat decreased today.  Cardiovascular: IRIR, rate controlled. No MRG Abdomen: Soft, non-tender, non-distended Extremities: Trace pitting edema of both feet. Anasarca upper extremities. No visible weeping today.  Neuro: Easily arouses to verbal que. RASS -1. Does follow commands in both uppers and L  lower extremity.  GU: Foley draining clear yellow urine Skin:Grossly intact.   Resolved Hospital Problem list     Assessment & Plan:   Acute hypoxemic and hypercarbic respiratory failure requiring intubation on 1/3 Pneumococcal PNA.  Now staph aureus on respiratory culture. Bilateral pleural effusions s/p thora, recurring, transudative -1/4 the patient tolerated PSV/CPAP x 6 hours without fatigue.  P: -Continue low tidal volume ventilation, 6 to 8 cc/kg ideal body weight.  Goal plateau less than 30 and driving pressure less than 15 -Daily SAT SBT -VAP prevention protocol -Continue chest PT to assist with clearing secretions.  -Continue to follow respiratory culture (few staph aureus).  Consult pharmacy for vancomycin dosing until susceptibilities return -Hold diuresis today -Continue ceftriaxone  Proteus mirabilis bacteremia -Continue ceftriaxone  Acute metabolic encephalopathy in setting of critical illness, bacteremia, on chronic dementia  -LFTs, ammonia WNL P:  -Minimizing sedation is much as possible using fentanyl as needed.  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 LV grade II diastolic dysfunction. RV severely reduced systolic function. Trivial pericardial effusion. -On ECHO, large L pleural effusion is seen  -Prior ECHO is from 2016 with LVEF 55-60%  -Tachycardic today, unclear if sinus versus SVT ~140 this morning  P: - Diuresis as tolerated. (holding today due to hyponatremia) - Continue midodrine 5 mg 3 times daily.  Working to titrate off given her heart failure, but she is not tolerating this. - Given low blood pressures, she unfortunately will not tolerate guideline directed heart failure therapy - Metoprolol as needed for A. Fib w/  RVR  Hyponatremia Hypochloremia Likely due  to diuresis - Hold diuresis today - Decrease free water. - Repeat BMP in AM    Sacral decubitus wound  -present on admission, un-stagable  -Wound care  Hx Dementia Hx CVA  -Delirium precautions, minimizing sedation as much as possible -Continuing only PTA benzodiazepines -Very poor long-term prognosis due to concern for aspiration and future pneumonias.  Inadequate PO intake Severe protein calorie malnutrition -Continue enteral feeds  Best practice:  Diet: Tube feeding Pain/Anxiety/Delirium protocol (if indicated):  PTA xanax, PRN fent, Daily Seroquel. VAP protocol (if indicated): yes DVT prophylaxis: Subcu heparin Mobility: Bedrest Code Status: Full code Family Communication: Husband updated at bedside, son lives in [place] ([place]) Disposition: ICU  Labs   CBC: Recent Labs  Lab 09/25/19 0902 09/26/19 0216 09/27/19 0212 09/28/19 1341 09/29/19 0227  WBC 16.3* 12.3* 12.1* 11.5* 10.3  HGB 9.0* 8.0* 9.3* 9.2* 8.5*  HCT 29.2* 27.1* 30.3* 28.4* 26.7*  MCV 99.3 99.3 96.2 91.3 93.7  PLT 128* 195 164 248 [id number]    Basic Metabolic Panel: Recent Labs  Lab 09/25/19 0902 09/26/19 0216 09/27/19 0212 09/28/19 1341 09/29/19 0227  NA 140 138 136 134* 132*  K  4.9 4.2 4.2 3.7 5.0  CL 106 101 97* 91* 92*  CO2 27 29 29 29 30   GLUCOSE 130* 94 87 112* 120*  BUN 36* 37* 41* 41* 44*  CREATININE 0.44 0.40* 0.47 0.45 0.52  CALCIUM 8.4* 8.0* 8.2* 8.1* 7.9*  MG  --   --  2.0 1.8 2.4  PHOS  --   --  3.9 4.2 3.8   GFR: Estimated Creatinine Clearance: 67 mL/min (by C-G formula based on SCr of 0.52 mg/dL). Recent Labs  Lab 09/26/19 0216 09/27/19 0212 09/28/19 1341 09/29/19 0227  WBC 12.3* 12.1* 11.5* 10.3    Liver Function Tests: Recent Labs  Lab 09/24/19 0305 09/24/19 1034 09/25/19 0902 09/26/19 0216 09/29/19 0227  AST 54* 47* 36 38 29  ALT 51* 47* 37 39 29  ALKPHOS 114 104 92 81 72  BILITOT 0.6 0.5 0.4 0.5 0.7  PROT 4.7* 4.8* 4.9* 4.6* 5.3*  ALBUMIN 2.4*  2.6* 2.4* 2.3* 2.3*   No results for input(s): LIPASE, AMYLASE in the last 168 hours. Recent Labs  Lab 09/24/19 1034 09/25/19 0902  AMMONIA 30 25    ABG    Component Value Date/Time   PHART 7.508 (H) 09/24/2019 1320   PCO2ART 34.1 09/24/2019 1320   PO2ART 195 (H) 09/24/2019 1320   HCO3 26.8 09/24/2019 1320   TCO2 24 08/31/2015 1142   ACIDBASEDEF 0.8 09/16/2019 1622   O2SAT 99.1 09/24/2019 1320     Coagulation Profile: No results for input(s): INR, PROTIME in the last 168 hours.  Cardiac Enzymes: No results for input(s): CKTOTAL, CKMB, CKMBINDEX, TROPONINI in the last 168 hours.  HbA1C: Hgb A1c MFr Bld  Date/Time Value Ref Range Status  09/18/2019 01:30 PM 5.6 4.8 - 5.6 % Final    Comment:    (NOTE) Pre diabetes:          5.7%-6.4% Diabetes:              >6.4% Glycemic control for   <7.0% adults with diabetes   09/01/2015 04:28 AM 5.7 (H) 4.8 - 5.6 % Final    Comment:    (NOTE)         Pre-diabetes: 5.7 - 6.4         Diabetes: >6.4         Glycemic control for adults with diabetes: <7.0     CBG: Recent Labs  Lab 09/28/19 1145 09/28/19 1714 09/28/19 2024 09/28/19 2313 09/29/19 0316  GLUCAP 119* 92 94 99 82   CRITICAL CARE Performed by: Corey Harold   Total critical care time: 45 minutes  Critical care time was exclusive of separately billable procedures and treating other patients.  Critical care was necessary to treat or prevent imminent or life-threatening deterioration.  Critical care was time spent personally by me on the following activities: development of treatment plan with patient and/or surrogate as well as nursing, discussions with consultants, evaluation of patient's response to treatment, examination of patient, obtaining history from patient or surrogate, ordering and performing treatments and interventions, ordering and review of laboratory studies, ordering and review of radiographic studies, pulse oximetry and re-evaluation of  patient's condition.  Georgann Housekeeper, AGACNP-BC [place]  See Amion for personal pager PCCM on call pager [phone]  09/29/2019 8:34 AM

## 2019-09-29 NOTE — Progress Notes (Signed)
[facility] Progress Note Patient Name: Barbara Flowers DOB: [birth year] MRN: [id number]   Date of Service  09/29/2019  HPI/Events of Note  Na+ = 132.   eICU Interventions  Will order: 1. 0.9 NaCl to run IV at 50 mL/hour.      Intervention Category Major Interventions: Electrolyte abnormality - evaluation and management  Willodean Leven Eugene 09/29/2019, 4:20 AM

## 2019-09-30 ENCOUNTER — Inpatient Hospital Stay (HOSPITAL_COMMUNITY): Payer: Medicare Other

## 2019-09-30 LAB — MAGNESIUM: Magnesium: 2.2 mg/dL (ref 1.7–2.4)

## 2019-09-30 LAB — COMPREHENSIVE METABOLIC PANEL
ALT: 23 U/L (ref 0–44)
AST: 17 U/L (ref 15–41)
Albumin: 2.2 g/dL — ABNORMAL LOW (ref 3.5–5.0)
Alkaline Phosphatase: 78 U/L (ref 38–126)
Anion gap: 11 (ref 5–15)
BUN: 45 mg/dL — ABNORMAL HIGH (ref 8–23)
CO2: 30 mmol/L (ref 22–32)
Calcium: 8.1 mg/dL — ABNORMAL LOW (ref 8.9–10.3)
Chloride: 94 mmol/L — ABNORMAL LOW (ref 98–111)
Creatinine, Ser: 0.54 mg/dL (ref 0.44–1.00)
GFR calc Af Amer: >60 mL/min (ref >60)
GFR calc non Af Amer: >60 mL/min (ref >60)
Glucose, Bld: 122 mg/dL — ABNORMAL HIGH (ref 70–99)
Potassium: 3.9 mmol/L (ref 3.5–5.1)
Sodium: 135 mmol/L (ref 135–145)
Total Bilirubin: 0.5 mg/dL (ref 0.3–1.2)
Total Protein: 5.7 g/dL — ABNORMAL LOW (ref 6.5–8.1)

## 2019-09-30 LAB — CBC
HCT: 27.1 % — ABNORMAL LOW (ref 36.0–46.0)
Hemoglobin: 8.4 g/dL — ABNORMAL LOW (ref 12.0–15.0)
MCH: 29.5 pg (ref 26.0–34.0)
MCHC: 31 g/dL (ref 30.0–36.0)
MCV: 95.1 fL (ref 80.0–100.0)
Platelets: 300 10*3/uL (ref 150–400)
RBC: 2.85 MIL/uL — ABNORMAL LOW (ref 3.87–5.11)
RDW: 14.5 % (ref 11.5–15.5)
WBC: 19.1 10*3/uL — ABNORMAL HIGH (ref 4.0–10.5)
nRBC: 0 % (ref 0.0–0.2)

## 2019-09-30 LAB — GLUCOSE, CAPILLARY
Glucose-Capillary: 96 mg/dL (ref 70–99)
Glucose-Capillary: 109 mg/dL — ABNORMAL HIGH (ref 70–99)
Glucose-Capillary: 135 mg/dL — ABNORMAL HIGH (ref 70–99)

## 2019-09-30 MED ORDER — ALBUTEROL SULFATE (2.5 MG/3ML) 0.083% IN NEBU
2.5000 mg | INHALATION_SOLUTION | RESPIRATORY_TRACT | Status: DC | PRN
  Filled 2019-09-30 (×2): qty 3

## 2019-09-30 MED ORDER — FUROSEMIDE 10 MG/ML IJ SOLN
40.0000 mg | Freq: Two times a day (BID) | INTRAMUSCULAR | Status: DC
  Administered 2019-09-30 – 2019-10-04 (×8): 40 mg via INTRAVENOUS
  Filled 2019-09-30 (×8): qty 4

## 2019-09-30 MED ORDER — MIDAZOLAM HCL 2 MG/2ML IJ SOLN
INTRAMUSCULAR | Status: AC
  Administered 2019-09-30: 2 mg
  Filled 2019-09-30: qty 2

## 2019-09-30 NOTE — Procedures (Signed)
Bronchoscopy Procedure Note Shaquana Palmero [id number] 12-08-1950  Procedure: Bronchoscopy Indications: Diagnostic evaluation of the airways and Remove secretions  Procedure Details Consent: Risks of procedure as well as the alternatives and risks of each were explained to the (patient/caregiver).  Consent for procedure obtained. From Husband over the phone  Time Out: Verified patient identification, verified procedure, site/side was marked, verified correct patient position, special equipment/implants available, medications/allergies/relevent history reviewed, required imaging and test results available.  Performed  In preparation for procedure, patient was given 100% FiO2 and bronchoscope lubricated. Sedation: Benzodiazepines and Fentanyl  Airway entered and the following bronchi were examined: RUL, RML, LUL and LLL.   Procedures performed: Copious secretions completely blocking off the left mainstem, which were suctioned multiple times and eventually able to see LUL and LLL bronchus. LLL bronchus still has copious secretions. Bronchoscope removed.    RUL was clear and some secretions in BI  Evaluation Hemodynamic Status: BP stable throughout; O2 sats: stable throughout Patient's Current Condition: stable Specimens:  Sent purulent fluid Complications: No apparent complications Patient did tolerate procedure well.   Tally Due 09/30/2019

## 2019-09-30 NOTE — Progress Notes (Signed)
Patient began to desat and hold oxygenation levels at 88-89%. Nurse suctioned secretions from the ETT and and mouth 3-4 times in an hour with no improvement. Respiratory therapy increased her PEEP from 5-7, and FiO2 from 60%-70%. Pt was holding at 89-90% and E-link notified. MD orders given to increase PEEP to 10, and STAT chest X-ray ordered.   Radiology called back with results that entire L lung is opacified (compared to morning of 1/8) and Dr. Oletta Darter was paged. Awaiting further orders and RN will continue to monitor closely.

## 2019-09-30 NOTE — Progress Notes (Signed)
eLink Physician-Brief Progress Note Patient Name: Barbara Flowers DOB: 1950/11/26 MRN: [id number]   Date of Service  09/30/2019  HPI/Events of Note  Hypoxemia - Prior CXR with LLL atelectasis vs pleural effusion vs both. Copious respiratory secretions.   eICU Interventions  Will order: 1. Portable CXR STAT. 2. Increase PEEP to 10. 3. Albuterol 2.5 mg via neb Q 3 hours PRN wheezing or SOB.      Intervention Category Major Interventions: Hypoxemia - evaluation and management  [place] Yepiz Eugene 09/30/2019, 12:20 AM

## 2019-09-30 NOTE — Progress Notes (Signed)
[place] Progress Note Patient Name: Barbara Flowers DOB: 12-06-1950 MRN: [id number]   Date of Service  09/30/2019  HPI/Events of Note  Review of CXR reveals Interval complete opacification of the left hemithorax which may represent combination of pleural effusion, atelectasis, infiltrate. There is shift of the mediastinum into the left hemithorax, representing a degree of volume loss, likely secondary to atelectasis. The right lung remains clear. No pneumothorax.  eICU Interventions  Will order: 1. Chest PT and PD 2. Notify ground team to evaluate for possible bronchoscopy.      Intervention Category Major Interventions: Hypoxemia - evaluation and management  [name] 09/30/2019, 1:19 AM

## 2019-09-30 NOTE — Progress Notes (Signed)
Critical care event note  Situation: Patient desaturated to 88 and lower, FiO2 was increased but patient continued to saturate in lower 80s.  Multiple suctioning attempts were done without any improvement.  Chest x-ray showed complete whiteout of the left side with left mainstem cutoff sign.  Patient was turned to her right side and chest physiotherapy was attempted without any success.  Patient was requiring 100% FiO2, PEEP was increased to 14 but she continued to saturate around 85-86%  Physical exam:  Alert on mechanical ventilation Poor cough No chest wall movement on the left side, no breath sounds on the left side, good breath sounds on the right without wheezing  Assessment/plan  Chest x-ray showed complete whiteout of left lung with mediastinal shift towards the left and left mainstem cutoff suggesting likely atelectasis from mucous plugging. Multiple attempts at suctioning and chest physiotherapy were made but unsuccessful and patient continued to saturate below 88% on 100% FiO2 as well as PEEP 14  Bedside bronchoscopy was performed which showed copious thick green secretions completely blocking the left mainstem, some spilling into the right mainstem.  They were suctioned with improvement in her oxygen saturation to 100% during the procedure.  Please see the separate bronchoscopy note for details The secretions were sent for culture. Repeat x-ray post procedure issues aeration of the left lung  Critical care time spent in evaluation and intervention excluding procedures: 35 min

## 2019-09-30 NOTE — Progress Notes (Signed)
NAME:  Barbara Flowers, MRN:  [id number], DOB:  Feb 09, 1951, LOS: 54 ADMISSION DATE:  09/16/2019, CONSULTATION DATE:  12/26  REFERRING MD:  Earlie Counts, CHIEF COMPLAINT: Shortness of breath, altered mental status  Brief History   66 yowf never smoker with h/o breast ca s/p bone marrow transplant early 1990s in [place] and cancer free s recent rx but problems with sundowning/ dementia and w/c bound x 3 years p cva acutely worse x one week PTA with cough/ congestion rx over the phone by PCP since 12/21 and gradually worse x 2 days PTA with poor po intake, ams and weakness with mostly mucoid sputum > to ER pm 12/26 with extensive L infiltrate and L effusion with hypoxemia corrected on non rebreathing and PCCM service asked to admit. Signed off to the hospitalist service Was asked to see her again for altered mental status, hypoxemia, shortness of breath Post thoracentesis for about 820 cc of fluid  Past Medical History   Past Medical History:  Diagnosis Date  . Anemia   . Anemia   . Anxiety disorder   . Blood transfusion without reported diagnosis   . Brain cancer [facility])    s/p sterotactic radio surgery  . Brain tumor ([place])   . Breast cancer ([place])   . Cancer ([place]) 1990   L breast  . H/O bone marrow transplant ([place])   . Hypothyroidism   . IBS (irritable bowel syndrome)   . MDD (major depressive disorder)   . Memory loss   . Obesity   . Orthostatic hypotension   . Seizure ([place])   . Seizures ([place])   . Stroke [facility]) 2005   hemorrhagic  . Stroke ([place])   . Subdural hematoma ([place])   . Upper brachial plexus paralysis syndrome   . Urinary incontinence    Significant Hospital Events   12/28 thora 1/2 thora 1/3 intubated and transferred to ICU 1/4 weaning vent x 6 hours  1/7 weaning OK but copious secretions 1/8 started vanc for MRSA sputum  Consults:  PCCM  Procedures:  1/3 ETT>>>  Significant Diagnostic Tests:  Blood culture 12/28 bedside thoracentesis-200 cc 1/2 thoracentesis-820  cc of fluid drained Chest x-ray 09/24/2019-bilateral pleural effusions, possible left lower lobe infiltrate -ECHO 1/3 LVEF 25-30%, severely decreased LV systolic function. LV grade II diastolic dysfunction. RV severely reduced systolic function. Trivial pericardial effusion , L pleural effusion is seen   1/6 CXR> dense left lower lobe opacity silhouetting the left hemidiaphragm   1/6 upper extremity ultrasounds-no DVTs  Micro Data:  Covid 19 PCR 12/26 >> neg RVP 12/26 >> neg  Procalcitonin 12/26 >> 2.01, down trended to 0.4 with abx BCx2 12/26 >> Pan sensitive Proteus Mirabilis UC 12/26 >> negative Urine legionella 12/26 >>   Urine Pneum Ag 12/26 >> positive Respiratory culture 1/6>> MRSA  Antimicrobials:  Rocephin 12/26 >> (14 day course) 1/8 Azithromycin 12/27-28 Vancomycin 12/26, 1/8>>  Interim history/subjective:  Mucus plugged overnight, requiring bronchoscopy  Objective   Blood pressure 125/60, pulse 77, temperature 98.8 F (37.1 C), temperature source Axillary, resp. rate 18, height 5\' 8"  (1.727 m), weight 66.3 kg, SpO2 100 %.    Vent Mode: PRVC FiO2 (%):  [50 %-70 %] 50 % Set Rate:  [14 bmp] 14 bmp Vt Set:  [450 mL-510 mL] 450 mL PEEP:  [7 cmH20-14 cmH20] 10 cmH20 Plateau Pressure:  [17 cmH20-27 cmH20] 22 cmH20   Intake/Output Summary (Last 24 hours) at 09/30/2019 1944 Last data filed at 09/30/2019 1800 Gross per 24  hour  Intake 3063.62 ml  Output 1725 ml  Net 1338.62 ml   Filed Weights   09/28/19 0500 09/29/19 0500 09/30/19 0324  Weight: 73.5 kg 72.3 kg 66.3 kg    Examination:  General: Frail, very fatigued appearing elderly woman lying in bed in no acute distress Lungs: Rhonchi bilaterally, thin her secretions today. Cardiovascular: Regular rate and rhythm, no murmurs Abdomen: Soft, nontender, nondistended Extremities: Improved pitting edema in the upper extremities, minimal foot edema.  No weeping from arms today. Neuro: RASS 0, eyes open and tracking.   Nodding very lightly, but not shaking her head no.  Trying to mouth answers occasionally.  Globally very weak.  Moving all extremities other than her right toes.  More alert than previous exams GU: Foley catheter draining clear yellow urine Skin: Pallor, no significant wounds or bruising  Resolved Hospital Problem list     Assessment & Plan:   Acute hypoxemic and hypercarbic respiratory failure requiring intubation on 1/3 Pneumococcal PNA.  Now MRSA on respiratory culture. Bilateral pleural effusions s/p thora, recurring, transudative Has had varying success with vent weaning for 5 days. P: -Continue low tidal volume ventilation, 6 to 8 cc Per KG ideal body weight with goal plateau less than 30 and driving pressure less than 15 -Titrate PEEP and FiO2 per ARDS net protocol -Daily SAT and SBT.  Avoiding all sedation as much as possible due to encephalopathy.  Goal RASS 0 -VAP prevention protocol -Continue vancomycin -Continue chest PT with clearance of secretions -Remain concerned about her ability to be weaned from the ventilator given her weak cough  Proteus mirabilis bacteremia -Completed 14-day course of ceftriaxone  Acute metabolic encephalopathy in setting of critical illness, bacteremia, on chronic dementia  -LFTs, ammonia WNL P:  -Minimizing sedation is much as possible using fentanyl as needed.  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 LV grade II diastolic dysfunction. RV severely reduced systolic function. Trivial pericardial effusion. -On ECHO, large L pleural effusion is seen  -Prior ECHO is from 2016 with LVEF 55-60%  -Tachycardic today, unclear if sinus versus SVT ~140 this morning  P: - Diuresis as tolerated. (holding today due to hyponatremia) - Continue midodrine 5 mg 3 times  daily.  Working to titrate off given her heart failure, but she is not tolerating this. - Given low blood pressures, she unfortunately will not tolerate guideline directed heart failure therapy - Metoprolol as needed for A. Fib w/  RVR  Hyponatremia-resolved Hypochloremia-improving Likely due to diuresis -Restart diuresis this evening -BMP in a.m.   Sacral decubitus wound  -present on admission, un-stagable  -Wound care  Hx Dementia Hx CVA  -Delirium precautions and minimizing sedation is much as possible -Stopped nighttime Seroquel and Xanax -Remain concerned about her long-term prognosis given her prolonged time needing mechanical ventilation and severe weakness.  Inadequate PO intake Severe protein calorie malnutrition -Continue enteral feeds  Leukocytosis-unclear if this is reactive due to mucous plugging events overnight and need for bronchoscopy versus response to treatment of MRSA -Continue to monitor -Reculture if fevers or hypothermia  Best practice:  Diet: Tube feeding Pain/Anxiety/Delirium protocol (if indicated):  PTA xanax, PRN fent, Daily Seroquel. VAP protocol (if indicated): yes DVT prophylaxis: Subcu heparin Mobility: Bedrest Code Status: Full code Family Communication: Husband updated at bedside, son lives in [place] [employer], government)- updated on phone Disposition: ICU  Labs   CBC: Recent Labs  Lab 09/26/19 0216 09/27/19 0212 09/28/19 1341 09/29/19 0227 09/30/19 0925  WBC 12.3* 12.1* 11.5* 10.3 19.1*  HGB 8.0* 9.3* 9.2* 8.5* 8.4*  HCT 27.1* 30.3* 28.4* 26.7* 27.1*  MCV 99.3 96.2 91.3 93.7 95.1  PLT 195 164 248 259 [id number]    Basic Metabolic Panel: Recent Labs  Lab 09/26/19 0216 09/27/19 0212 09/28/19 1341 09/29/19 0227 09/30/19 0925  NA 138 136 134* 132* 135  K 4.2 4.2 3.7 5.0 3.9  CL 101 97* 91* 92* 94*  CO2 29 29 29 30 30   GLUCOSE 94 87 112* 120* 122*  BUN 37* 41* 41* 44* 45*  CREATININE 0.40* 0.47 0.45 0.52 0.54  CALCIUM 8.0* 8.2* 8.1*  7.9* 8.1*  MG  --  2.0 1.8 2.4 2.2  PHOS  --  3.9 4.2 3.8  --    GFR: Estimated Creatinine Clearance: 67 mL/min (by C-G formula based on SCr of 0.54 mg/dL). Recent Labs  Lab 09/27/19 0212 09/28/19 1341 09/29/19 0227 09/30/19 0925  WBC 12.1* 11.5* 10.3 19.1*    Liver Function Tests: Recent Labs  Lab 09/24/19 1034 09/25/19 0902 09/26/19 0216 09/29/19 0227 09/30/19 0925  AST 47* 36 38 29 17  ALT 47* 37 39 29 23  ALKPHOS 104 92 81 72 78  BILITOT 0.5 0.4 0.5 0.7 0.5  PROT 4.8* 4.9* 4.6* 5.3* 5.7*  ALBUMIN 2.6* 2.4* 2.3* 2.3* 2.2*   No results for input(s): LIPASE, AMYLASE in the last 168 hours. Recent Labs  Lab 09/24/19 1034 09/25/19 0902  AMMONIA 30 25    ABG    Component Value Date/Time   PHART 7.508 (H) 09/24/2019 1320   PCO2ART 34.1 09/24/2019 1320   PO2ART 195 (H) 09/24/2019 1320   HCO3 26.8 09/24/2019 1320   TCO2 24 08/31/2015 1142   ACIDBASEDEF 0.8 09/16/2019 1622   O2SAT 99.1 09/24/2019 1320     Coagulation Profile: No results for input(s): INR, PROTIME in the last 168 hours.  Cardiac Enzymes: No results for input(s): CKTOTAL, CKMB, CKMBINDEX, TROPONINI in the last 168 hours.  HbA1C: Hgb A1c MFr Bld  Date/Time Value Ref Range Status  09/18/2019 01:30 PM 5.6 4.8 - 5.6 % Final    Comment:    (NOTE) Pre diabetes:          5.7%-6.4% Diabetes:              >6.4% Glycemic control for   <7.0% adults with diabetes   09/01/2015 04:28 AM 5.7 (H) 4.8 - 5.6 % Final    Comment:    (NOTE)         Pre-diabetes: 5.7 - 6.4         Diabetes: >6.4         Glycemic control for adults with diabetes: <7.0     CBG: Recent Labs  Lab 09/29/19 1516 09/29/19 1943 09/29/19 2355 09/30/19 0319 09/30/19 0738  GLUCAP 109* 108* 101* 96 109*    This patient is critically ill with multiple organ system failure which requires frequent high complexity decision making, assessment, support, evaluation, and titration of therapies. This was completed through the  application of advanced monitoring technologies and extensive interpretation of multiple databases. During this encounter critical care time was devoted to patient care services described in this note for 45 minutes.  Julian Hy, DO 09/30/19 7:53 PM [facility]

## 2019-10-01 DIAGNOSIS — R5381 Other malaise: Secondary | ICD-10-CM

## 2019-10-01 DIAGNOSIS — E440 Moderate protein-calorie malnutrition: Secondary | ICD-10-CM

## 2019-10-01 DIAGNOSIS — A4902 Methicillin resistant Staphylococcus aureus infection, unspecified site: Secondary | ICD-10-CM

## 2019-10-01 LAB — GLUCOSE, CAPILLARY
Glucose-Capillary: 106 mg/dL — ABNORMAL HIGH (ref 70–99)
Glucose-Capillary: 110 mg/dL — ABNORMAL HIGH (ref 70–99)
Glucose-Capillary: 110 mg/dL — ABNORMAL HIGH (ref 70–99)
Glucose-Capillary: 112 mg/dL — ABNORMAL HIGH (ref 70–99)
Glucose-Capillary: 113 mg/dL — ABNORMAL HIGH (ref 70–99)
Glucose-Capillary: 113 mg/dL — ABNORMAL HIGH (ref 70–99)
Glucose-Capillary: 114 mg/dL — ABNORMAL HIGH (ref 70–99)
Glucose-Capillary: 124 mg/dL — ABNORMAL HIGH (ref 70–99)

## 2019-10-01 LAB — COMPREHENSIVE METABOLIC PANEL
ALT: 24 U/L (ref 0–44)
AST: 20 U/L (ref 15–41)
Albumin: 2.4 g/dL — ABNORMAL LOW (ref 3.5–5.0)
Alkaline Phosphatase: 76 U/L (ref 38–126)
Anion gap: 10 (ref 5–15)
BUN: 45 mg/dL — ABNORMAL HIGH (ref 8–23)
CO2: 30 mmol/L (ref 22–32)
Calcium: 8.3 mg/dL — ABNORMAL LOW (ref 8.9–10.3)
Chloride: 95 mmol/L — ABNORMAL LOW (ref 98–111)
Creatinine, Ser: 0.45 mg/dL (ref 0.44–1.00)
GFR calc Af Amer: >60 mL/min (ref >60)
GFR calc non Af Amer: >60 mL/min (ref >60)
Glucose, Bld: 123 mg/dL — ABNORMAL HIGH (ref 70–99)
Potassium: 3.7 mmol/L (ref 3.5–5.1)
Sodium: 135 mmol/L (ref 135–145)
Total Bilirubin: 0.7 mg/dL (ref 0.3–1.2)
Total Protein: 6 g/dL — ABNORMAL LOW (ref 6.5–8.1)

## 2019-10-01 LAB — CBC
HCT: 27.1 % — ABNORMAL LOW (ref 36.0–46.0)
Hemoglobin: 8.4 g/dL — ABNORMAL LOW (ref 12.0–15.0)
MCH: 30.1 pg (ref 26.0–34.0)
MCHC: 31 g/dL (ref 30.0–36.0)
MCV: 97.1 fL (ref 80.0–100.0)
Platelets: 291 10*3/uL (ref 150–400)
RBC: 2.79 MIL/uL — ABNORMAL LOW (ref 3.87–5.11)
RDW: 14.6 % (ref 11.5–15.5)
WBC: 14.3 10*3/uL — ABNORMAL HIGH (ref 4.0–10.5)
nRBC: 0 % (ref 0.0–0.2)

## 2019-10-01 LAB — MAGNESIUM: Magnesium: 2 mg/dL (ref 1.7–2.4)

## 2019-10-01 LAB — VANCOMYCIN, PEAK: Vancomycin Pk: 53 ug/mL (ref 30–40)

## 2019-10-01 MED ORDER — SENNOSIDES 8.8 MG/5ML PO SYRP
5.0000 mL | ORAL_SOLUTION | Freq: Two times a day (BID) | ORAL | Status: DC
  Administered 2019-10-01 – 2019-10-20 (×20): 5 mL
  Filled 2019-10-01 (×41): qty 5

## 2019-10-01 MED ORDER — POTASSIUM CHLORIDE 20 MEQ/15ML (10%) PO SOLN
40.0000 meq | ORAL | Status: AC
  Administered 2019-10-01 (×2): 40 meq via ORAL
  Filled 2019-10-01 (×2): qty 30

## 2019-10-01 NOTE — Progress Notes (Signed)
NAME:  Barbara Flowers, MRN:  [id number], DOB:  06-18-1951, LOS: 3 ADMISSION DATE:  09/16/2019, CONSULTATION DATE:  12/26  REFERRING MD:  Earlie Counts, CHIEF COMPLAINT: Shortness of breath, altered mental status  Brief History   48 yowf never smoker with h/o breast ca s/p bone marrow transplant early 1990s in [place] and cancer free s recent rx but problems with sundowning/ dementia and w/c bound x 3 years p cva acutely worse x one week PTA with cough/ congestion rx over the phone by PCP since 12/21 and gradually worse x 2 days PTA with poor po intake, ams and weakness with mostly mucoid sputum > to ER pm 12/26 with extensive L infiltrate and L effusion with hypoxemia corrected on non rebreathing and PCCM service asked to admit. Signed off to the hospitalist service Was asked to see her again for altered mental status, hypoxemia, shortness of breath Post thoracentesis for about 820 cc of fluid  Past Medical History   Past Medical History:  Diagnosis Date  . Anemia   . Anemia   . Anxiety disorder   . Blood transfusion without reported diagnosis   . Brain cancer [facility])    s/p sterotactic radio surgery  . Brain tumor ([place])   . Breast cancer ([place])   . Cancer ([place]) 1990   L breast  . H/O bone marrow transplant ([place])   . Hypothyroidism   . IBS (irritable bowel syndrome)   . MDD (major depressive disorder)   . Memory loss   . Obesity   . Orthostatic hypotension   . Seizure ([place])   . Seizures ([place])   . Stroke [facility]) 2005   hemorrhagic  . Stroke ([place])   . Subdural hematoma ([place])   . Upper brachial plexus paralysis syndrome   . Urinary incontinence    Significant Hospital Events   12/28 thora 1/2 thora 1/3 intubated and transferred to ICU 1/4 weaning vent x 6 hours  1/7 weaning OK but copious secretions 1/8 started vanc for MRSA sputum  Consults:  PCCM  Procedures:  1/3 ETT>>>  Significant Diagnostic Tests:  Blood culture 12/28 bedside thoracentesis-200 cc 1/2 thoracentesis-820  cc of fluid drained Chest x-ray 09/24/2019-bilateral pleural effusions, possible left lower lobe infiltrate -ECHO 1/3 LVEF 25-30%, severely decreased LV systolic function. LV grade II diastolic dysfunction. RV severely reduced systolic function. Trivial pericardial effusion , L pleural effusion is seen   1/6 CXR> dense left lower lobe opacity silhouetting the left hemidiaphragm   1/6 upper extremity ultrasounds-no DVTs  Micro Data:  Covid 19 PCR 12/26 >> neg RVP 12/26 >> neg  Procalcitonin 12/26 >> 2.01, down trended to 0.4 with abx BCx2 12/26 >> Pan sensitive Proteus Mirabilis UC 12/26 >> negative Urine legionella 12/26 >>   Urine Pneum Ag 12/26 >> positive Respiratory culture 1/6>> MRSA  Antimicrobials:  Rocephin 12/26 >> (14 day course) 1/8 Azithromycin 12/27-28 Vancomycin 12/26, 1/8>>  Interim history/subjective:  No acute events overnight  Objective   Blood pressure (!) 133/51, pulse 69, temperature 98.1 F (36.7 C), temperature source Axillary, resp. rate 14, height 5\' 8"  (1.727 m), weight 67 kg, SpO2 100 %.    Vent Mode: PRVC FiO2 (%):  [30 %-50 %] 30 % Set Rate:  [10 bmp-14 bmp] 10 bmp Vt Set:  [410 mL-450 mL] 410 mL PEEP:  [5 cmH20-10 cmH20] 5 cmH20 Plateau Pressure:  [11 cmH20-22 cmH20] 11 cmH20   Intake/Output Summary (Last 24 hours) at 10/01/2019 0904 Last data filed at 10/01/2019 0630 Gross per  24 hour  Intake 2226.06 ml  Output 2050 ml  Net 176.06 ml   Filed Weights   09/29/19 0500 09/30/19 0324 10/01/19 0500  Weight: 72.3 kg 66.3 kg 67 kg    Examination:  General: Frail, critically ill-appearing woman lying in bed no acute distress Lungs: Clear bilaterally, continued large volume thin white secretions.  Breathing comfortably on the vent. Cardiovascular: Regular rate and rhythm, no murmurs Abdomen: Soft, nontender, nondistended Extremities: Improving pitting edema in all extremities Neuro: RASS 0, eyes open and tracking, still only minimally  communicating by nodding.  Not trying to talk today.  Not moving right toes, but moving other extremities somewhat.  Very weak. GU: Foley catheter draining clear yellow urine Skin: Pallor, minimal weeping from arm, no significant rashes or wounds  Resolved Hospital Problem list     Assessment & Plan:   Acute hypoxemic and hypercarbic respiratory failure requiring intubation on 1/3 Pneumococcal PNA.  Now MRSA on respiratory culture. Bilateral pleural effusions s/p thora, recurring, transudative Has had varying success with vent weaning for 5 days. P: -Continue LTV V, 68 cc/kg ideal body weight with goal plateau less than 30 and driving pressure less than 15 -Titrated back down to 5 of PEEP and 30% FiO2 -Daily SAT and SBT.  Avoiding all sedation.  Failed SBT due to hypoventilation. -Parenteral protocol -Continue vancomycin -Continue aggressive chest PT -I remain concerned about her ability to be weaned to mechanical ventilation with her weakness and large volume of secretions.  Proteus mirabilis bacteremia -Previously completed 14-day course of ceftriaxone  Acute metabolic encephalopathy in setting of critical illness, bacteremia, on chronic dementia - improving -LFTs, ammonia WNL P:  -Minimal sedation.  Fentanyl as needed only.  Goal RASS 0. -Avoid deliriogenic medications -Treat underlying metabolic derangements and sepsis -Encouraging frequent reorientation and her husbands visits -Unfortunately not responsive enough to participate with physical therapy.  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 LV grade II diastolic dysfunction. RV severely reduced systolic function. Trivial pericardial effusion. New cardiac dysfunction since 2016.  P: -Diuresis as tolerated -Continue midodrine 5 mg 3 times daily.  Working on titrating off given heart failure. -Unfortunately  would not tolerate guideline directed medical therapy for systolic heart failure given hypotension -Tolerating low-dose metoprolol for A. fib with RVR episodes.  Hyponatremia-resolved Hypochloremia-improving Likely due to diuresis -Continue to monitor -Continue diuresis   Sacral decubitus wound  -present on admission, un-stagable  -Continue wound care  Hx Dementia Hx CVA  Debility -Continue delirium precautions and minimizing sedation as much as possible -Holding all PTA meds causing sedation -Remain concerned about her long-term prognosis given the prolonged time on mechanical ventilation, encephalopathy, debility, concerned her recurrent aspiration, weak cough.  Inadequate PO intake Severe protein calorie malnutrition -Continue enteral feeds  Leukocytosis-unclear if this is reactive due to mucous plugging events overnight and need for bronchoscopy versus response to treatment of MRSA. Improving. -Continue to monitor -Reculture if fevers or hypothermia.  Best practice:  Diet: Tube feeding Pain/Anxiety/Delirium protocol (if indicated):  PTA xanax, PRN fent, Daily Seroquel. VAP protocol (if indicated): yes DVT prophylaxis: Subcu heparin Mobility: Bedrest Code Status: Full code Family Communication: Husband updated at bedside today. Son lives in [place] ([place])- updated on phone multiple days this week. Disposition: ICU  Labs   CBC: Recent Labs  Lab 09/26/19 0216 09/27/19 0212 09/28/19 1341 09/29/19 0227 09/30/19 0925  WBC 12.3* 12.1* 11.5* 10.3 19.1*  HGB 8.0* 9.3* 9.2* 8.5* 8.4*  HCT 27.1* 30.3* 28.4* 26.7* 27.1*  MCV 99.3 96.2 91.3 93.7 95.1  PLT 195 164 248 259 [id number]    Basic Metabolic Panel: Recent Labs  Lab 09/26/19 0216 09/27/19 0212 09/28/19 1341 09/29/19 0227 09/30/19 0925  NA 138 136 134* 132* 135  K 4.2 4.2 3.7 5.0 3.9  CL 101 97* 91* 92* 94*  CO2 29 29 29 30 30   GLUCOSE 94 87 112* 120* 122*  BUN 37* 41* 41* 44* 45*  CREATININE 0.40*  0.47 0.45 0.52 0.54  CALCIUM 8.0* 8.2* 8.1* 7.9* 8.1*  MG  --  2.0 1.8 2.4 2.2  PHOS  --  3.9 4.2 3.8  --    GFR: Estimated Creatinine Clearance: 67 mL/min (by C-G formula based on SCr of 0.54 mg/dL). Recent Labs  Lab 09/27/19 0212 09/28/19 1341 09/29/19 0227 09/30/19 0925  WBC 12.1* 11.5* 10.3 19.1*    Liver Function Tests: Recent Labs  Lab 09/24/19 1034 09/25/19 0902 09/26/19 0216 09/29/19 0227 09/30/19 0925  AST 47* 36 38 29 17  ALT 47* 37 39 29 23  ALKPHOS 104 92 81 72 78  BILITOT 0.5 0.4 0.5 0.7 0.5  PROT 4.8* 4.9* 4.6* 5.3* 5.7*  ALBUMIN 2.6* 2.4* 2.3* 2.3* 2.2*   No results for input(s): LIPASE, AMYLASE in the last 168 hours. Recent Labs  Lab 09/24/19 1034 09/25/19 0902  AMMONIA 30 25    ABG    Component Value Date/Time   PHART 7.508 (H) 09/24/2019 1320   PCO2ART 34.1 09/24/2019 1320   PO2ART 195 (H) 09/24/2019 1320   HCO3 26.8 09/24/2019 1320   TCO2 24 08/31/2015 1142   ACIDBASEDEF 0.8 09/16/2019 1622   O2SAT 99.1 09/24/2019 1320     Coagulation Profile: No results for input(s): INR, PROTIME in the last 168 hours.  Cardiac Enzymes: No results for input(s): CKTOTAL, CKMB, CKMBINDEX, TROPONINI in the last 168 hours.  HbA1C: Hgb A1c MFr Bld  Date/Time Value Ref Range Status  09/18/2019 01:30 PM 5.6 4.8 - 5.6 % Final    Comment:    (NOTE) Pre diabetes:          5.7%-6.4% Diabetes:              >6.4% Glycemic control for   <7.0% adults with diabetes   09/01/2015 04:28 AM 5.7 (H) 4.8 - 5.6 % Final    Comment:    (NOTE)         Pre-diabetes: 5.7 - 6.4         Diabetes: >6.4         Glycemic control for adults with diabetes: <7.0     CBG: Recent Labs  Lab 09/30/19 1258 09/30/19 1531 09/30/19 2000 10/01/19 0001 10/01/19 0419  GLUCAP 113* 110* 135* 124* 110*    This patient is critically ill with multiple organ system failure which requires frequent high complexity decision making, assessment, support, evaluation, and titration of  therapies. This was completed through the application of advanced monitoring technologies and extensive interpretation of multiple databases. During this encounter critical care time was devoted to patient care services described in this note for 40 minutes.  Julian Hy, DO 10/01/19 4:32 PM [facility]

## 2019-10-01 NOTE — Progress Notes (Signed)
CRITICAL VALUE ALERT  Critical Value:  Vanco peak  Date & Time Notied:  1/10, 17:57  Provider Notified: Dr Carlis Abbott  Orders Received/Actions taken: MD notified

## 2019-10-01 NOTE — Progress Notes (Signed)
Pharmacy Antibiotic Note  Barbara Flowers is a 69 y.o. female admitted on 09/16/2019 with Staph growing in sputum culture.  Pharmacy has been consulted for vancomycin dosing.  Today, 10/01/19  D4 Vanc for MRSA PNA  WBC elevated  Afebrile  SCr stable  Plan:  Continue current vanc dose of 1500mg  IV q24  Will check a vanc peak and trough level with next vanc dosing to calculate AUC level - goal 400-550 per protocol  Height: 5\' 8"  (172.7 cm) Weight: 147 lb 11.3 oz (67 kg) IBW/kg (Calculated) : 63.9  Temp (24hrs), Avg:98.3 F (36.8 C), Min:98.1 F (36.7 C), Max:98.5 F (36.9 C)  Recent Labs  Lab 09/26/19 0216 09/27/19 0212 09/28/19 1341 09/29/19 0227 09/30/19 0925  WBC 12.3* 12.1* 11.5* 10.3 19.1*  CREATININE 0.40* 0.47 0.45 0.52 0.54    Estimated Creatinine Clearance: 67 mL/min (by C-G formula based on SCr of 0.54 mg/dL).    Allergies  Allergen Reactions  . Gabapentin Other (See Comments)    seizure  . Tramadol Other (See Comments)    Brings on Seizures      Thank you for allowing pharmacy to be a part of this patient's care.  Kara Mead 10/01/2019 9:13 AM

## 2019-10-02 ENCOUNTER — Inpatient Hospital Stay (HOSPITAL_COMMUNITY): Payer: Medicare Other

## 2019-10-02 LAB — COMPREHENSIVE METABOLIC PANEL
ALT: 27 U/L (ref 0–44)
AST: 27 U/L (ref 15–41)
Albumin: 2.4 g/dL — ABNORMAL LOW (ref 3.5–5.0)
Alkaline Phosphatase: 79 U/L (ref 38–126)
Anion gap: 13 (ref 5–15)
BUN: 46 mg/dL — ABNORMAL HIGH (ref 8–23)
CO2: 29 mmol/L (ref 22–32)
Calcium: 8.6 mg/dL — ABNORMAL LOW (ref 8.9–10.3)
Chloride: 96 mmol/L — ABNORMAL LOW (ref 98–111)
Creatinine, Ser: 0.54 mg/dL (ref 0.44–1.00)
GFR calc Af Amer: >60 mL/min (ref >60)
GFR calc non Af Amer: >60 mL/min (ref >60)
Glucose, Bld: 109 mg/dL — ABNORMAL HIGH (ref 70–99)
Potassium: 4.6 mmol/L (ref 3.5–5.1)
Sodium: 138 mmol/L (ref 135–145)
Total Bilirubin: 0.6 mg/dL (ref 0.3–1.2)
Total Protein: 6.2 g/dL — ABNORMAL LOW (ref 6.5–8.1)

## 2019-10-02 LAB — CBC
HCT: 28 % — ABNORMAL LOW (ref 36.0–46.0)
Hemoglobin: 8.6 g/dL — ABNORMAL LOW (ref 12.0–15.0)
MCH: 29.9 pg (ref 26.0–34.0)
MCHC: 30.7 g/dL (ref 30.0–36.0)
MCV: 97.2 fL (ref 80.0–100.0)
Platelets: 353 10*3/uL (ref 150–400)
RBC: 2.88 MIL/uL — ABNORMAL LOW (ref 3.87–5.11)
RDW: 14.7 % (ref 11.5–15.5)
WBC: 14.3 10*3/uL — ABNORMAL HIGH (ref 4.0–10.5)
nRBC: 0 % (ref 0.0–0.2)

## 2019-10-02 LAB — GLUCOSE, CAPILLARY
Glucose-Capillary: 105 mg/dL — ABNORMAL HIGH (ref 70–99)
Glucose-Capillary: 106 mg/dL — ABNORMAL HIGH (ref 70–99)
Glucose-Capillary: 118 mg/dL — ABNORMAL HIGH (ref 70–99)
Glucose-Capillary: 118 mg/dL — ABNORMAL HIGH (ref 70–99)
Glucose-Capillary: 124 mg/dL — ABNORMAL HIGH (ref 70–99)
Glucose-Capillary: 136 mg/dL — ABNORMAL HIGH (ref 70–99)
Glucose-Capillary: 137 mg/dL — ABNORMAL HIGH (ref 70–99)

## 2019-10-02 LAB — VANCOMYCIN, TROUGH: Vancomycin Tr: 26 ug/mL (ref 15–20)

## 2019-10-02 LAB — MAGNESIUM: Magnesium: 2.1 mg/dL (ref 1.7–2.4)

## 2019-10-02 MED ORDER — VITAL AF 1.2 CAL PO LIQD
1000.0000 mL | ORAL | Status: AC
  Administered 2019-10-02 – 2019-10-05 (×2): 1000 mL

## 2019-10-02 MED ORDER — VANCOMYCIN HCL IN DEXTROSE 1-5 GM/200ML-% IV SOLN
1000.0000 mg | INTRAVENOUS | Status: DC
  Administered 2019-10-02 – 2019-10-04 (×3): 1000 mg via INTRAVENOUS
  Filled 2019-10-02 (×3): qty 200

## 2019-10-02 MED ORDER — PRO-STAT SUGAR FREE PO LIQD
30.0000 mL | Freq: Every day | ORAL | Status: DC
  Administered 2019-10-02 – 2019-10-15 (×9): 30 mL
  Filled 2019-10-02 (×9): qty 30

## 2019-10-02 NOTE — Progress Notes (Signed)
HR as high as 160, a-fib with RVR. pulled IV lopressor to administer. RVR resolved before lopressor could be given.

## 2019-10-02 NOTE — Progress Notes (Signed)
Nutrition Follow-up  DOCUMENTATION CODES:   Non-severe (moderate) malnutrition in context of chronic illness  INTERVENTION:  - will adjust TF regimen to better meet needs: Vital AF 1.2 @ 45 ml/hr with 30 ml prostat once/day. - this regimen will provide 1396 kcal (96% estimated kcal need), 96 grams protein, and 876 ml free water.  - free water flush, if desired, to be per MD/NP.    NUTRITION DIAGNOSIS:   Moderate Malnutrition related to chronic illness(wheelchair bound x3 years following CVA) as evidenced by mild fat depletion, mild muscle depletion. 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 She presented to the ED due to 1 week of worsening cough and congestion. She was also experiencing poor oral intake, AMS, and increased weakness.  Significant Events: 12/26- admission 12/29- TF initiation  1/3- intubation   Patient remains intubated with NGT in R nare. She is receiving Vital High Protein @ 50 ml/hr with 100 ml water QID. This regimen is providing 1200 kcal, 105 grams protein, and 1403 ml free water. Flow sheet indicates patient has moderate edema to RUE, severe/deep pitting edema to LUE, and mild edema to BLE. Weight has been trending back down now that lasix is ordered BID. Will adjust kcal need based on weight on 1/2 (59.9 kg).  Able to talk with RN who reports that patient is tolerating current TF regimen without noted issue.   Per notes: - acute hypoxemic and hypercarbic respiratory failure--possible need for trach in the near future; not safe for extubation - bacteremia - acute metabolic [id number]--1/9 critical illness, bacteremia, and hx of dementia -  acute on CHF and episodes of afib with RVR - hyponatremia and hypochloremia--2/2 diuresis - leukocytosis   Patient is currently intubated on ventilator support MV: 7.1 L/min Temp (24hrs), Avg:98.9 F (37.2 C), Min:98 F (36.7 C), Max:100 F (37.8 C) Propofol: none BP: 133/59 and MAP: 81  Labs reviewed; CBG: 118 mg/dl, Cl: 96 mmol/l, BUN: 46 mg/dl, Ca: 8.6 mg/dl.] Medications reviewed; sliding scale novolog, 40 mg IV protonix/day, 40 mEq KCl x2 doses 1/10, 5 ml senokot per tube BID, 40 mg IV lasix BID.      Diet Order:   Diet Order            Diet NPO time specified  Diet effective now              EDUCATION NEEDS:   Not appropriate for education at this time  Skin:  Skin Assessment: Skin Integrity Issues: Skin Integrity Issues:: Other (Comment) Stage I: upper L back Stage II: L buttocks and L thigh Stage III: sacrum Other: venous stasis ulcer to L thigh  Last BM:  1/8  Height:   Ht Readings from Last 1 Encounters:  09/25/19 '5\' 8"'$  (1.727 m)    Weight:   Wt Readings from Last 1 Encounters:  10/02/19 65.1 kg    Ideal Body Weight:  63.6 kg  BMI:  Body mass index is 21.82 kg/m.  Estimated Nutritional Needs:   Kcal:  1451 kcal  Protein:  90-105 grams  Fluid:  >/= 2 L/day     Jarome Matin, MS, RD, LDN, CNSC Inpatient Clinical Dietitian Pager # [phone] After  hours/weekend pager # [phone]

## 2019-10-02 NOTE — Progress Notes (Signed)
NAME:  Cyenna Mcwright, MRN:  [id number], DOB:  1951-08-24, LOS: 53 ADMISSION DATE:  09/16/2019, CONSULTATION DATE:  12/26  REFERRING MD:  Earlie Counts, CHIEF COMPLAINT: Shortness of breath, altered mental status  Brief History   69 yowf never smoker with h/o breast ca s/p bone marrow transplant early 1990s in [place] and cancer free s recent rx but problems with sundowning/ dementia and w/c bound x 3 years p cva acutely worse x one week PTA with cough/ congestion rx over the phone by PCP since 12/21 and gradually worse x 2 days PTA with poor po intake, ams and weakness with mostly mucoid sputum > to ER pm 12/26 with extensive L infiltrate and L effusion with hypoxemia corrected on non rebreathing and PCCM service asked to admit. Signed off to the hospitalist service Was asked to see her again for altered mental status, hypoxemia, shortness of breath Post thoracentesis for about 820 cc of fluid Has MRSA pneumonia  Past Medical History   Past Medical History:  Diagnosis Date  . Anemia   . Anemia   . Anxiety disorder   . Blood transfusion without reported diagnosis   . Brain cancer [facility])    s/p sterotactic radio surgery  . Brain tumor ([place])   . Breast cancer ([place])   . Cancer ([place]) 1990   L breast  . H/O bone marrow transplant ([place])   . Hypothyroidism   . IBS (irritable bowel syndrome)   . MDD (major depressive disorder)   . Memory loss   . Obesity   . Orthostatic hypotension   . Seizure ([place])   . Seizures ([place])   . Stroke [facility]) 2005   hemorrhagic  . Stroke ([place])   . Subdural hematoma ([place])   . Upper brachial plexus paralysis syndrome   . Urinary incontinence    Significant Hospital Events   12/28 thora 1/2 thora 1/3 intubated and transferred to ICU 1/4 weaning vent x 6 hours  1/7 weaning OK but copious secretions 1/8 started vanc for MRSA sputum 1/11 not weaning well  Consults:  PCCM  Procedures:  1/3 ETT>>>  Significant Diagnostic Tests:  Blood culture 12/28 bedside  thoracentesis-200 cc 1/2 thoracentesis-820 cc of fluid drained Chest x-ray 09/24/2019-bilateral pleural effusions, possible left lower lobe infiltrate -ECHO 1/3 LVEF 25-30%, severely decreased LV systolic function. LV grade II diastolic dysfunction. RV severely reduced systolic function. Trivial pericardial effusion , L pleural effusion is seen   1/6 CXR> dense left lower lobe opacity silhouetting the left hemidiaphragm   1/6 upper extremity ultrasounds-no DVTs  Micro Data:  Covid 19 PCR 12/26 >> neg RVP 12/26 >> neg  Procalcitonin 12/26 >> 2.01, down trended to 0.4 with abx BCx2 12/26 >> Pan sensitive Proteus Mirabilis UC 12/26 >> negative Urine legionella 12/26 >>   Urine Pneum Ag 12/26 >> positive Respiratory culture 1/6>> MRSA  Antimicrobials:  Rocephin 12/26 >> (14 day course) 1/8 Azithromycin 12/27-28 Vancomycin 12/26, 1/8>>(day4)  Interim history/subjective:  No acute events overnight Rouses to name calling  Objective   Blood pressure (!) 121/56, pulse 71, temperature 99.7 F (37.6 C), temperature source Axillary, resp. rate 19, height 5\' 8"  (1.727 m), weight 65.1 kg, SpO2 100 %.    Vent Mode: PRVC FiO2 (%):  [30 %] 30 % Set Rate:  [10 bmp] 10 bmp Vt Set:  [410 mL] 410 mL PEEP:  [5 cmH20] 5 cmH20 Plateau Pressure:  [12 cmH20-14 cmH20] 13 cmH20   Intake/Output Summary (Last 24 hours) at 10/02/2019 0844 Last  data filed at 10/02/2019 0759 Gross per 24 hour  Intake 1313.57 ml  Output 4675 ml  Net -3361.43 ml   Filed Weights   09/30/19 0324 10/01/19 0500 10/02/19 0500  Weight: 66.3 kg 67 kg 65.1 kg    Examination:  General: Frail, chronically ill-appearing, does not appear in acute distress Lungs: Clear bilaterally, still with significant secretions  cardiovascular: S1-S2 appreciated, regular rate and rhythm Abdomen: Soft, nontender, bowel sounds appreciated Extremities: Edema in all extremities Neuro: Very weak, attempts to follow commands GU: Foley catheter  draining clear yellow urine Skin: Weeping from anasarca  Resolved Hospital Problem list     Assessment & Plan:   Acute hypoxemic and hypercarbic respiratory failure -Intubated 1/3 -MRSA pneumonia -Not weaning well -Continue low tidal volume ventilation, 68cc/kg, driving pressure less than 15 -PEEP of 5, FiO2 of 30% -Continue SBT -Continue vancomycin -Continue chest PT -Obtain chest x-ray today  -May not be able to clear secretions, respiratory muscle weakness -May require tracheostomy  Proteus mirabilis bacteremia -Completed treatment with ceftriaxone  Acute metabolic encephalopathy in setting of critical illness, bacteremia, chronic dementia -LFTs within normal limits -Ammonia within normal limits -Minimize sedation -Correct electrolyte derangement -Not able to participate in physical therapy at present  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 intake Severe protein calorie malnutrition -Continue enteral feeds  Leukocytosis -Remains stable -Continue to monitor -Reculture as needed -Currently on antibiotics for MRSA pneumonia  Obtain ABG, obtain chest x-ray Continue weaning process With copious secretions-may not be able to safely extubate yet as she remains very weak and may not be able to clear secretions  Best practice:  Diet: Continue enteral feeds Pain/Anxiety/Delirium protocol (if indicated):  PTA xanax, PRN fent, Daily Seroquel. VAP protocol (if indicated): In place DVT prophylaxis: Subcu  heparin Mobility: Bedrest Code Status: Full code Family Communication: Will update Disposition: ICU  Labs   CBC: Recent Labs  Lab 09/28/19 1341 09/29/19 0227 09/30/19 0925 10/01/19 0906 10/02/19 0152  WBC 11.5* 10.3 19.1* 14.3* 14.3*  HGB 9.2* 8.5* 8.4* 8.4* 8.6*  HCT 28.4* 26.7* 27.1* 27.1* 28.0*  MCV 91.3 93.7 95.1 97.1 97.2  PLT 248 259 300 291 0000000    Basic Metabolic Panel: Recent Labs  Lab 09/27/19 0212 09/28/19 1341 09/29/19 0227 09/30/19 0925 10/01/19 0906 10/02/19 0152  NA 136 134* 132* 135 135 138  K 4.2 3.7 5.0 3.9 3.7 4.6  CL 97* 91* 92* 94* 95* 96*  CO2 29 29 30 30 30 29   GLUCOSE 87 112* 120* 122* 123* 109*  BUN 41* 41* 44* 45* 45* 46*  CREATININE 0.47 0.45 0.52 0.54 0.45 0.54  CALCIUM 8.2* 8.1* 7.9* 8.1* 8.3* 8.6*  MG 2.0 1.8 2.4 2.2 2.0 2.1  PHOS 3.9 4.2 3.8  --   --   --    GFR: Estimated Creatinine Clearance: 67 mL/min (by C-G formula based on SCr of 0.54 mg/dL). Recent Labs  Lab 09/29/19 0227 09/30/19 0925 10/01/19 0906 10/02/19 0152  WBC 10.3 19.1* 14.3* 14.3*    Liver Function Tests: Recent Labs  Lab 09/26/19 0216 09/29/19 0227 09/30/19 0925 10/01/19 0906 10/02/19 0152  AST  38 29 17 20 27   ALT 39 29 23 24 27   ALKPHOS 81 72 78 76 79  BILITOT 0.5 0.7 0.5 0.7 0.6  PROT 4.6* 5.3* 5.7* 6.0* 6.2*  ALBUMIN 2.3* 2.3* 2.2* 2.4* 2.4*   No results for input(s): LIPASE, AMYLASE in the last 168 hours. Recent Labs  Lab 09/25/19 0902  AMMONIA 25    ABG    Component Value Date/Time   PHART 7.508 (H) 09/24/2019 1320   PCO2ART 34.1 09/24/2019 1320   PO2ART 195 (H) 09/24/2019 1320   HCO3 26.8 09/24/2019 1320   TCO2 24 08/31/2015 1142   ACIDBASEDEF 0.8 09/16/2019 1622   O2SAT 99.1 09/24/2019 1320     Coagulation Profile: No results for input(s): INR, PROTIME in the last 168 hours.  Cardiac Enzymes: No results for input(s): CKTOTAL, CKMB, CKMBINDEX, TROPONINI in the last 168 hours.  HbA1C: Hgb A1c MFr Bld  Date/Time Value Ref  Range Status  09/18/2019 01:30 PM 5.6 4.8 - 5.6 % Final    Comment:    (NOTE) Pre diabetes:          5.7%-6.4% Diabetes:              >6.4% Glycemic control for   <7.0% adults with diabetes   09/01/2015 04:28 AM 5.7 (H) 4.8 - 5.6 % Final    Comment:    (NOTE)         Pre-diabetes: 5.7 - 6.4         Diabetes: >6.4         Glycemic control for adults with diabetes: <7.0     CBG: Recent Labs  Lab 10/01/19 1129 10/01/19 1559 10/01/19 1938 10/01/19 2341 10/02/19 0339  GLUCAP 114* 106* 113* 106* 118*   The patient is critically ill with multiple organ systems failure and requires high complexity decision making for assessment and support, frequent evaluation and titration of therapies, application of advanced monitoring technologies and extensive interpretation of multiple databases. Critical Care Time devoted to patient care services described in this note independent of APP/resident time (if applicable)  is 32 minutes.   Sherrilyn Rist MD [place] [facility] Personal pager: [phone] If unanswered, please page CCM On-call: [phone]

## 2019-10-02 NOTE — Progress Notes (Signed)
Pharmacy Antibiotic Note  Barbara Flowers is a 69 y.o. female currently on vancomycin day #5 for MRSA PNA.  - afebrile. WBC stable 14.3 -  Scr 0.54 stable CrCl 67  1/10 Vpk at 1645 = 53 (vanc 1500 mg dose given on 1/10 at 1320) 1/11 VT at 1307= 26 - calculated AUC 936, ke= 0.0350, t1/2= 19.8, Vd= 45.8   Plan: - adjust vancomycin dose to 1000mg  IV q24h for calc AUC 468, Cmin 28 and Cmin 13  _________________________________________  Height: 5\' 8"  (172.7 cm) Weight: 143 lb 8.3 oz (65.1 kg) IBW/kg (Calculated) : 63.9  Temp (24hrs), Avg:98.8 F (37.1 C), Min:98 F (36.7 C), Max:100 F (37.8 C)  Recent Labs  Lab 09/28/19 1341 09/29/19 0227 09/30/19 0925 10/01/19 0906 10/01/19 1645 10/02/19 0152  WBC 11.5* 10.3 19.1* 14.3*  --  14.3*  CREATININE 0.45 0.52 0.54 0.45  --  0.54  VANCOPEAK  --   --   --   --  53*  --     Estimated Creatinine Clearance: 67 mL/min (by C-G formula based on SCr of 0.54 mg/dL).    Allergies  Allergen Reactions  . Gabapentin Other (See Comments)    seizure  . Tramadol Other (See Comments)    Brings on Seizures     Antimicrobials this admission:  CTX 12/26 >> 1/8 Azith 12/27 >> 12/28 1/7 vanc >>  Microbiology results:  12/26 BCx: 1/4 bottles with MR-CoNS AND proteus  12/28 Pleural Fluid:  ngF 12/27 MRSA PCR: negative 1/2 Pleural fluid: NGTD 1/6 resp culture: few staph - MRSA  Thank you for allowing pharmacy to be a part of this patient's care.  Lynelle Doctor 10/02/2019 1:28 PM

## 2019-10-02 NOTE — Progress Notes (Signed)
CPT not completed HR in 150's- RN aware.

## 2019-10-03 LAB — COMPREHENSIVE METABOLIC PANEL
ALT: 35 U/L (ref 0–44)
AST: 39 U/L (ref 15–41)
Albumin: 2.5 g/dL — ABNORMAL LOW (ref 3.5–5.0)
Alkaline Phosphatase: 81 U/L (ref 38–126)
Anion gap: 13 (ref 5–15)
BUN: 49 mg/dL — ABNORMAL HIGH (ref 8–23)
CO2: 32 mmol/L (ref 22–32)
Calcium: 8.9 mg/dL (ref 8.9–10.3)
Chloride: 93 mmol/L — ABNORMAL LOW (ref 98–111)
Creatinine, Ser: 0.58 mg/dL (ref 0.44–1.00)
GFR calc Af Amer: >60 mL/min (ref >60)
GFR calc non Af Amer: >60 mL/min (ref >60)
Glucose, Bld: 115 mg/dL — ABNORMAL HIGH (ref 70–99)
Potassium: 3.9 mmol/L (ref 3.5–5.1)
Sodium: 138 mmol/L (ref 135–145)
Total Bilirubin: 0.7 mg/dL (ref 0.3–1.2)
Total Protein: 6.5 g/dL (ref 6.5–8.1)

## 2019-10-03 LAB — CBC
HCT: 28.2 % — ABNORMAL LOW (ref 36.0–46.0)
Hemoglobin: 8.6 g/dL — ABNORMAL LOW (ref 12.0–15.0)
MCH: 29.6 pg (ref 26.0–34.0)
MCHC: 30.5 g/dL (ref 30.0–36.0)
MCV: 96.9 fL (ref 80.0–100.0)
Platelets: 365 10*3/uL (ref 150–400)
RBC: 2.91 MIL/uL — ABNORMAL LOW (ref 3.87–5.11)
RDW: 14.6 % (ref 11.5–15.5)
WBC: 13.9 10*3/uL — ABNORMAL HIGH (ref 4.0–10.5)
nRBC: 0 % (ref 0.0–0.2)

## 2019-10-03 LAB — GLUCOSE, CAPILLARY
Glucose-Capillary: 110 mg/dL — ABNORMAL HIGH (ref 70–99)
Glucose-Capillary: 130 mg/dL — ABNORMAL HIGH (ref 70–99)
Glucose-Capillary: 125 mg/dL — ABNORMAL HIGH (ref 70–99)
Glucose-Capillary: 111 mg/dL — ABNORMAL HIGH (ref 70–99)
Glucose-Capillary: 110 mg/dL — ABNORMAL HIGH (ref 70–99)
Glucose-Capillary: 134 mg/dL — ABNORMAL HIGH (ref 70–99)

## 2019-10-03 MED ORDER — ACETAMINOPHEN 160 MG/5ML PO SOLN
650.0000 mg | ORAL | Status: DC | PRN
  Administered 2019-10-03 – 2019-10-20 (×5): 650 mg
  Filled 2019-10-03 (×5): qty 20.3

## 2019-10-03 MED ORDER — AMIODARONE LOAD VIA INFUSION: 150.0000 mg | Freq: Once | INTRAVENOUS | Status: DC

## 2019-10-03 MED ORDER — SODIUM CHLORIDE 0.9% FLUSH: 10.0000 mL | INTRAVENOUS | Status: DC | PRN

## 2019-10-03 MED ORDER — AMIODARONE IV BOLUS ONLY 150 MG/100ML: 150.0000 mg | Freq: Once | INTRAVENOUS | Status: DC

## 2019-10-03 NOTE — Progress Notes (Signed)
NAME:  Barbara Flowers, MRN:  [id number], DOB:  03-24-1951, LOS: 39 ADMISSION DATE:  09/16/2019, CONSULTATION DATE:  12/26  REFERRING MD:  Earlie Counts, CHIEF COMPLAINT: Shortness of breath, altered mental status  Brief History   71 yowf never smoker with h/o breast ca s/p bone marrow transplant early 1990s in [place] and cancer free s recent rx but problems with sundowning/ dementia and w/c bound x 3 years p cva acutely worse x one week PTA with cough/ congestion rx over the phone by PCP since 12/21 and gradually worse x 2 days PTA with poor po intake, ams and weakness with mostly mucoid sputum > to ER pm 12/26 with extensive L infiltrate and L effusion with hypoxemia corrected on non rebreathing and PCCM service asked to admit. Signed off to the hospitalist service Was asked to see her again for altered mental status, hypoxemia, shortness of breath Post thoracentesis for about 820 cc of fluid Has MRSA pneumonia  Past Medical History   Past Medical History:  Diagnosis Date  . Anemia   . Anemia   . Anxiety disorder   . Blood transfusion without reported diagnosis   . Brain cancer [facility])    s/p sterotactic radio surgery  . Brain tumor ([place])   . Breast cancer ([place])   . Cancer ([place]) 1990   L breast  . H/O bone marrow transplant ([place])   . Hypothyroidism   . IBS (irritable bowel syndrome)   . MDD (major depressive disorder)   . Memory loss   . Obesity   . Orthostatic hypotension   . Seizure ([place])   . Seizures ([place])   . Stroke [facility]) 2005   hemorrhagic  . Stroke ([place])   . Subdural hematoma ([place])   . Upper brachial plexus paralysis syndrome   . Urinary incontinence    Significant Hospital Events   12/28 thora 1/2 thora 1/3 intubated and transferred to ICU 1/4 weaning vent x 6 hours  1/7 weaning OK but copious secretions 1/8 started vanc for MRSA sputum 1/11 not weaning well 1/12-did 30 minutes of pressure support, secretions also slightly improved  Consults:  PCCM  Procedures:    1/3 ETT>>>  Significant Diagnostic Tests:  Blood culture 12/28 bedside thoracentesis-200 cc 1/2 thoracentesis-820 cc of fluid drained Chest x-ray 09/24/2019-bilateral pleural effusions, possible left lower lobe infiltrate -ECHO 1/3 LVEF 25-30%, severely decreased LV systolic function. LV grade II diastolic dysfunction. RV severely reduced systolic function. Trivial pericardial effusion , L pleural effusion is seen   1/6 CXR> dense left lower lobe opacity silhouetting the left hemidiaphragm   1/6 upper extremity ultrasounds-no DVTs  Micro Data:  Covid 19 PCR 12/26 >> neg RVP 12/26 >> neg  Procalcitonin 12/26 >> 2.01, down trended to 0.4 with abx BCx2 12/26 >> Pan sensitive Proteus Mirabilis UC 12/26 >> negative Urine legionella 12/26 >>   Urine Pneum Ag 12/26 >> positive Respiratory culture 1/6>> MRSA  Antimicrobials:  Rocephin 12/26 >> (14 day course) 1/8 Azithromycin 12/27-28 Vancomycin 12/26, 1/8>>(day 6)  Interim history/subjective:  No acute events overnight Rouses to name calling T-max 101  Objective   Blood pressure (!) 132/50, pulse 79, temperature (!) 100.9 F (38.3 C), temperature source Axillary, resp. rate 16, height 5\' 8"  (1.727 m), weight 65.2 kg, SpO2 100 %.    Vent Mode: CPAP;PSV FiO2 (%):  [30 %] 30 % Set Rate:  [10 bmp] 10 bmp Vt Set:  [410 mL] 410 mL PEEP:  [5 cmH20] 5 cmH20 Pressure Support:  [5  [place] Pressure:  [10 cmH20-14 cmH20] 13 cmH20   Intake/Output Summary (Last 24 hours) at 10/03/2019 [id number] Last data filed at 10/03/2019 [id number] Gross per 24 hour  Intake 1717.35 ml  Output 2400 ml  Net -682.65 ml   Filed Weights   10/01/19 0500 10/02/19 0500 10/03/19 0500  Weight: 67 kg 65.1 kg 65.2 kg    Examination:  General: Frail, chronically ill-appearing, not in distress Lungs: Clear breath sounds bilaterally, secretions better cardiovascular: S1-S2 appreciated, Abdomen: Soft, nontender, bowel sounds appreciated Extremities:  Edema bilaterally Neuro: Weak GU: Foley catheter draining clear yellow urine Skin: Weeping from anasarca  Resolved Hospital Problem list     Assessment & Plan:   Acute hypoxemic and hypercarbic respiratory failure -Intubated 1/3 -MRSA pneumonia on vancomycin -Continue low tidal volume ventilation, 6 to 8cc/kg, driving pressure less than 15 -Weaning trials as tolerated -Continue vancomycin -Continue chest PT  -Concern is that she may not be able to clear secretions, respiratory muscle weakness -May require tracheostomy  Proteus mirabilis bacteremia -Completed treatment with ceftriaxone  Acute metabolic encephalopathy in the setting of critical illness, bacteremia, chronic dementia -Ammonia within normal limits -LFTs within normal limits -Not able to participate in physical therapy at present  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 intake Severe protein calorie malnutrition -Continue enteral feeds  Continue weaning trials as tolerated  Best practice:  Diet: Continue enteral feeds Pain/Anxiety/Delirium protocol (if indicated):  PTA xanax, PRN fent, Daily Seroquel. VAP protocol (if indicated): In place DVT prophylaxis: Subcu heparin Mobility: Bedrest Code Status: Full code Family Communication: Will update Disposition: ICU Will plan Labs   CBC: Recent Labs  Lab 09/29/19 0227 09/30/19 0925 10/01/19 0906 10/02/19 0152 10/03/19 0312  WBC 10.3 19.1* 14.3* 14.3* 13.9*  HGB 8.5* 8.4* 8.4* 8.6* 8.6*  HCT 26.7* 27.1* 27.1* 28.0* 28.2*  MCV 93.7 95.1 97.1 97.2 96.9  PLT 259 300 291 353 [id number]    Basic Metabolic  Panel: Recent Labs  Lab 09/27/19 0212 09/28/19 1341 09/29/19 0227 09/30/19 0925 10/01/19 0906 10/02/19 0152 10/03/19 0312  NA 136 134* 132* 135 135 138 138  K 4.2 3.7 5.0 3.9 3.7 4.6 3.9  CL 97* 91* 92* 94* 95* 96* 93*  CO2 29 29 30 30 30 29  32  GLUCOSE 87 112* 120* 122* 123* 109* 115*  BUN 41* 41* 44* 45* 45* 46* 49*  CREATININE 0.47 0.45 0.52 0.54 0.45 0.54 0.58  CALCIUM 8.2* 8.1* 7.9* 8.1* 8.3* 8.6* 8.9  MG 2.0 1.8 2.4 2.2 2.0 2.1  --   PHOS 3.9 4.2 3.8  --   --   --   --    GFR: Estimated Creatinine Clearance: 67 mL/min (by C-G formula based on SCr of 0.58 mg/dL). Recent Labs  Lab 09/30/19 0925 10/01/19 0906 10/02/19 0152 10/03/19 0312  WBC 19.1* 14.3* 14.3* 13.9*    Liver Function Tests: Recent Labs  Lab 09/29/19 0227 09/30/19 0925 10/01/19 0906 10/02/19 0152 10/03/19 0312  AST 29 17 20 27  39  ALT 29 23 24 27  35  ALKPHOS 72 78 76 79 81  BILITOT 0.7 0.5 0.7 0.6 0.7  PROT 5.3* 5.7* 6.0* 6.2* 6.5  ALBUMIN 2.3* 2.2* 2.4* 2.4* 2.5*  No results for input(s): LIPASE, AMYLASE in the last 168 hours. No results for input(s): AMMONIA in the last 168 hours.  ABG    Component Value Date/Time   PHART 7.508 (H) 09/24/2019 1320   PCO2ART 34.1 09/24/2019 1320   PO2ART 195 (H) 09/24/2019 1320   HCO3 26.8 09/24/2019 1320   TCO2 24 08/31/2015 1142   ACIDBASEDEF 0.8 09/16/2019 1622   O2SAT 99.1 09/24/2019 1320     Coagulation Profile: No results for input(s): INR, PROTIME in the last 168 hours.  Cardiac Enzymes: No results for input(s): CKTOTAL, CKMB, CKMBINDEX, TROPONINI in the last 168 hours.  HbA1C: Hgb A1c MFr Bld  Date/Time Value Ref Range Status  09/18/2019 01:30 PM 5.6 4.8 - 5.6 % Final    Comment:    (NOTE) Pre diabetes:          5.7%-6.4% Diabetes:              >6.4% Glycemic control for   <7.0% adults with diabetes   09/01/2015 04:28 AM 5.7 (H) 4.8 - 5.6 % Final    Comment:    (NOTE)         Pre-diabetes: 5.7 - 6.4         Diabetes: >6.4          Glycemic control for adults with diabetes: <7.0     CBG: Recent Labs  Lab 10/02/19 1531 10/02/19 2020 10/02/19 2335 10/03/19 0327 10/03/19 0803  GLUCAP 105* 136* 118* 110* 130*   The patient is critically ill with multiple organ systems failure and requires high complexity decision making for assessment and support, frequent evaluation and titration of therapies, application of advanced monitoring technologies and extensive interpretation of multiple databases. Critical Care Time devoted to patient care services described in this note independent of APP/resident time (if applicable)  is 35 minutes.   Sherrilyn Rist MD [place] [facility] Personal pager: [phone] If unanswered, please page CCM On-call: [phone]

## 2019-10-03 NOTE — Progress Notes (Signed)
PT passed SBT today. Continued to wean on 5/5 for a total of approximately 2 hours- RN aware.

## 2019-10-04 ENCOUNTER — Inpatient Hospital Stay (HOSPITAL_COMMUNITY): Payer: Medicare Other

## 2019-10-04 LAB — BLOOD GAS, ARTERIAL
Acid-Base Excess: 13.4 mmol/L — ABNORMAL HIGH (ref 0.0–2.0)
Bicarbonate: 37.4 mmol/L — ABNORMAL HIGH (ref 20.0–28.0)
Drawn by: 27022
FIO2: 30
O2 Content: 100 L/min
O2 Saturation: 98.6 %
Patient temperature: 98.6
pCO2 arterial: 43.2 mmHg (ref 32.0–48.0)
pH, Arterial: 7.546 — ABNORMAL HIGH (ref 7.350–7.450)
pO2, Arterial: 123 mmHg — ABNORMAL HIGH (ref 83.0–108.0)

## 2019-10-04 LAB — GLUCOSE, CAPILLARY
Glucose-Capillary: 100 mg/dL — ABNORMAL HIGH (ref 70–99)
Glucose-Capillary: 111 mg/dL — ABNORMAL HIGH (ref 70–99)
Glucose-Capillary: 127 mg/dL — ABNORMAL HIGH (ref 70–99)
Glucose-Capillary: 108 mg/dL — ABNORMAL HIGH (ref 70–99)
Glucose-Capillary: 113 mg/dL — ABNORMAL HIGH (ref 70–99)

## 2019-10-04 MED ORDER — ACETAZOLAMIDE SODIUM 500 MG IJ SOLR
500.0000 mg | Freq: Once | INTRAMUSCULAR | Status: AC
  Administered 2019-10-04: 500 mg via INTRAVENOUS
  Filled 2019-10-04: qty 500

## 2019-10-04 MED ORDER — FUROSEMIDE 10 MG/ML IJ SOLN
40.0000 mg | Freq: Every day | INTRAMUSCULAR | Status: DC
  Administered 2019-10-05 – 2019-10-19 (×13): 40 mg via INTRAVENOUS
  Filled 2019-10-04 (×14): qty 4

## 2019-10-04 NOTE — Progress Notes (Signed)
Updated spouse in person and son over the phone  She appears to be weaning well  Chest x-ray is improving  Was having apneic episodes earlier, switched over to [facility]  Has a component of contraction alkalosis  Switch Lasix to once daily Add dose of Diamox x1  We will continue weaning  Anticipate giving her a chance of extubation 10/05/2019

## 2019-10-04 NOTE — Progress Notes (Signed)
NAME:  Barbara Flowers, MRN:  [id number], DOB:  08/09/1951, LOS: 85 ADMISSION DATE:  09/16/2019, CONSULTATION DATE:  12/26  REFERRING MD:  Earlie Counts, CHIEF COMPLAINT: Shortness of breath, altered mental status  Brief History   31 yowf never smoker with h/o breast ca s/p bone marrow transplant early 1990s in [place] and cancer free s recent rx but problems with sundowning/ dementia and w/c bound x 3 years p cva acutely worse x one week PTA with cough/ congestion rx over the phone by PCP since 12/21 and gradually worse x 2 days PTA with poor po intake, ams and weakness with mostly mucoid sputum > to ER pm 12/26 with extensive L infiltrate and L effusion with hypoxemia corrected on non rebreathing and PCCM service asked to admit. Signed off to the hospitalist service Was asked to see her again for altered mental status, hypoxemia, shortness of breath Post thoracentesis for about 820 cc of fluid Has MRSA pneumonia  Past Medical History   Past Medical History:  Diagnosis Date  . Anemia   . Anemia   . Anxiety disorder   . Blood transfusion without reported diagnosis   . Brain cancer [facility])    s/p sterotactic radio surgery  . Brain tumor ([place])   . Breast cancer ([place])   . Cancer ([place]) 1990   L breast  . H/O bone marrow transplant ([place])   . Hypothyroidism   . IBS (irritable bowel syndrome)   . MDD (major depressive disorder)   . Memory loss   . Obesity   . Orthostatic hypotension   . Seizure ([place])   . Seizures ([place])   . Stroke [facility]) 2005   hemorrhagic  . Stroke ([place])   . Subdural hematoma ([place])   . Upper brachial plexus paralysis syndrome   . Urinary incontinence    Significant Hospital Events   12/28 thora 1/2 thora 1/3 intubated and transferred to ICU 1/4 weaning vent x 6 hours  1/7 weaning OK but copious secretions 1/8 started vanc for MRSA sputum 1/11 not weaning well 1/12-did 30 minutes of pressure support, secretions also slightly improved  Consults:  PCCM  Procedures:    1/3 ETT>>>  Significant Diagnostic Tests:  Blood culture 12/28 bedside thoracentesis-200 cc 1/2 thoracentesis-820 cc of fluid drained Chest x-ray 09/24/2019-bilateral pleural effusions, possible left lower lobe infiltrate -ECHO 1/3 LVEF 25-30%, severely decreased LV systolic function. LV grade II diastolic dysfunction. RV severely reduced systolic function. Trivial pericardial effusion , L pleural effusion is seen   1/6 CXR> dense left lower lobe opacity silhouetting the left hemidiaphragm   1/6 upper extremity ultrasounds-no DVTs  Micro Data:  Covid 19 PCR 12/26 >> neg RVP 12/26 >> neg  Procalcitonin 12/26 >> 2.01, down trended to 0.4 with abx BCx2 12/26 >> Pan sensitive Proteus Mirabilis UC 12/26 >> negative Urine legionella 12/26 >>   Urine Pneum Ag 12/26 >> positive Respiratory culture 1/6>> MRSA  Antimicrobials:  Rocephin 12/26 >> (14 day course) 1/8 Azithromycin 12/27-28 Vancomycin 12/26, 1/8>>(day 6)  Interim history/subjective:  No acute events overnight Arouses to name calling Weaning on pressure support this morning T-max 99  Objective   Blood pressure (!) 128/53, pulse 69, temperature 98.8 F (37.1 C), temperature source Oral, resp. rate 13, height 5\' 8"  (1.727 m), weight 65.2 kg, SpO2 99 %.    Vent Mode: PSV FiO2 (%):  [30 %] 30 % Set Rate:  [10 bmp] 10 bmp Vt Set:  [410 mL] 410 mL PEEP:  [5 cmH20] 5  cmH20 Pressure Support:  [8 [place] Pressure:  [10 cmH20-17 cmH20] 14 cmH20   Intake/Output Summary (Last 24 hours) at 10/04/2019 [id number] Last data filed at 10/04/2019 0800 Gross per 24 hour  Intake 314.32 ml  Output 1345 ml  Net -1030.68 ml   Filed Weights   10/02/19 0500 10/03/19 0500 10/04/19 0454  Weight: 65.1 kg 65.2 kg 65.2 kg    Examination:  General: Frail, chronically ill-appearing Lungs: Clear breath sounds bilaterally, secretions appear to be better cardiovascular: S1-S2 appreciated Abdomen: Soft, bowel sounds  appreciated Extremities: Bilateral edema Neuro: Weak, arousable GU: Foley catheter draining clear yellow urine Skin: Warm  Resolved Hospital Problem list     Assessment & Plan:  Acute hypoxemic and hypercarbic respiratory failure -Intubated 1/3 -MRSA pneumonia on vancomycin -Lung protective strategy -Weaning trials as tolerated -Continue vancomycin -Continue chest PT -She appears to have tolerated weaning yesterday and today -We will give her a chance of extubation today  Proteus mirabilis bacteremia -Completed treatment ceftriaxone  Acute metabolic encephalopathy in the setting of critical illness, bacteremia, chronic dementia -Ammonia within normal limits -LFTs within normal limits -Unable to participate in physical therapy at present  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 VAP protocol (if indicated): In place DVT prophylaxis: Subcu heparin Mobility: Bedrest Code Status: Full code Family Communication: Will update Disposition: ICU   Labs   CBC: Recent Labs  Lab 09/29/19 0227 09/30/19 0925 10/01/19 0906 10/02/19 0152 10/03/19 0312  WBC 10.3 19.1* 14.3* 14.3* 13.9*  HGB 8.5* 8.4* 8.4* 8.6* 8.6*  HCT 26.7* 27.1* 27.1* 28.0* 28.2*  MCV 93.7 95.1 97.1 97.2 96.9  PLT 259 300 291 353 [id number]    Basic Metabolic Panel: Recent Labs  Lab 09/28/19 1341 09/29/19 0227 09/30/19 0925 10/01/19 0906 10/02/19 0152  10/03/19 0312  NA 134* 132* 135 135 138 138  K 3.7 5.0 3.9 3.7 4.6 3.9  CL 91* 92* 94* 95* 96* 93*  CO2 29 30 30 30 29  32  GLUCOSE 112* 120* 122* 123* 109* 115*  BUN 41* 44* 45* 45* 46* 49*  CREATININE 0.45 0.52 0.54 0.45 0.54 0.58  CALCIUM 8.1* 7.9* 8.1* 8.3* 8.6* 8.9  MG 1.8 2.4 2.2 2.0 2.1  --   PHOS 4.2 3.8  --   --   --   --    GFR: Estimated Creatinine Clearance: 67 mL/min (by C-G formula based on SCr of 0.58 mg/dL). Recent Labs  Lab 09/30/19 0925 10/01/19 0906 10/02/19 0152 10/03/19 0312  WBC 19.1* 14.3* 14.3* 13.9*    Liver Function Tests: Recent Labs  Lab 09/29/19 0227 09/30/19 0925 10/01/19 0906 10/02/19 0152 10/03/19 0312  AST 29 17 20 27  39  ALT 29 23 24 27  35  ALKPHOS 72 78 76 79 81  BILITOT 0.7 0.5 0.7 0.6 0.7  PROT 5.3* 5.7* 6.0* 6.2* 6.5  ALBUMIN 2.3* 2.2* 2.4* 2.4* 2.5*   No results for input(s): LIPASE, AMYLASE in the last 168 hours. No results for input(s): AMMONIA in the last 168 hours.  ABG    Component Value Date/Time   PHART 7.508 (H) 09/24/2019 1320   PCO2ART 34.1 09/24/2019 1320   PO2ART 195 (H) 09/24/2019 1320   HCO3 26.8 09/24/2019 1320   TCO2 24 08/31/2015 1142   ACIDBASEDEF 0.8 09/16/2019 1622   O2SAT 99.1 09/24/2019 1320     Coagulation Profile: No results for input(s): INR, PROTIME in the last 168 hours.  Cardiac Enzymes: No results for input(s): CKTOTAL, CKMB, CKMBINDEX, TROPONINI in the last 168 hours.  HbA1C: Hgb A1c MFr Bld  Date/Time Value Ref Range Status  09/18/2019 01:30 PM 5.6 4.8 - 5.6 % Final    Comment:    (NOTE) Pre diabetes:          5.7%-6.4% Diabetes:              >6.4% Glycemic control for   <7.0% adults with diabetes   09/01/2015 04:28 AM 5.7 (H) 4.8 - 5.6 % Final    Comment:    (NOTE)         Pre-diabetes: 5.7 - 6.4         Diabetes: >6.4         Glycemic control for adults with diabetes: <7.0     CBG: Recent Labs  Lab 10/03/19 1627 10/03/19 1925 10/03/19 2311 10/04/19 0344  10/04/19 0817  GLUCAP 111* 110* 134* 100* 111*   The patient is critically ill with multiple organ systems failure and requires high complexity decision making for assessment and support, frequent evaluation and titration of therapies, application of advanced monitoring technologies and extensive interpretation of multiple databases. Critical Care Time devoted to patient care services described in this note independent of APP/resident time (if applicable)  is 35 minutes.   Sherrilyn Rist MD [facility] Personal pager: [phone] If unanswered, please page CCM On-call: [phone]

## 2019-10-05 LAB — GLUCOSE, CAPILLARY
Glucose-Capillary: 103 mg/dL — ABNORMAL HIGH (ref 70–99)
Glucose-Capillary: 108 mg/dL — ABNORMAL HIGH (ref 70–99)
Glucose-Capillary: 111 mg/dL — ABNORMAL HIGH (ref 70–99)
Glucose-Capillary: 117 mg/dL — ABNORMAL HIGH (ref 70–99)

## 2019-10-05 LAB — BASIC METABOLIC PANEL
Anion gap: 12 (ref 5–15)
BUN: 49 mg/dL — ABNORMAL HIGH (ref 8–23)
CO2: 31 mmol/L (ref 22–32)
Calcium: 8.6 mg/dL — ABNORMAL LOW (ref 8.9–10.3)
Chloride: 94 mmol/L — ABNORMAL LOW (ref 98–111)
Creatinine, Ser: 0.57 mg/dL (ref 0.44–1.00)
GFR calc Af Amer: >60 mL/min (ref >60)
GFR calc non Af Amer: >60 mL/min (ref >60)
Glucose, Bld: 114 mg/dL — ABNORMAL HIGH (ref 70–99)
Potassium: 3.4 mmol/L — ABNORMAL LOW (ref 3.5–5.1)
Sodium: 137 mmol/L (ref 135–145)

## 2019-10-05 LAB — CREATININE, SERUM
Creatinine, Ser: 0.56 mg/dL (ref 0.44–1.00)
GFR calc Af Amer: >60 mL/min (ref >60)
GFR calc non Af Amer: >60 mL/min (ref >60)

## 2019-10-05 LAB — BLOOD GAS, ARTERIAL
Acid-Base Excess: 10.3 mmol/L — ABNORMAL HIGH (ref 0.0–2.0)
Bicarbonate: 34.5 mmol/L — ABNORMAL HIGH (ref 20.0–28.0)
FIO2: 30
O2 Saturation: 98.4 %
Patient temperature: 98.3
pCO2 arterial: 44.9 mmHg (ref 32.0–48.0)
pH, Arterial: 7.497 — ABNORMAL HIGH (ref 7.350–7.450)
pO2, Arterial: 87.1 mmHg (ref 83.0–108.0)

## 2019-10-05 MED ORDER — STERILE WATER FOR INJECTION IJ SOLN
INTRAMUSCULAR | Status: AC
  Filled 2019-10-05: qty 10

## 2019-10-05 MED ORDER — INSULIN ASPART 100 UNIT/ML ~~LOC~~ SOLN
2.0000 [IU] | SUBCUTANEOUS | Status: DC
  Administered 2019-10-06: 2 [IU] via SUBCUTANEOUS

## 2019-10-05 MED ORDER — OSMOLITE 1.2 CAL PO LIQD
1000.0000 mL | ORAL | Status: AC
  Administered 2019-10-05 – 2019-10-15 (×8): 1000 mL
  Filled 2019-10-05 (×5): qty 1000

## 2019-10-05 MED ORDER — ACETAZOLAMIDE SODIUM 500 MG IJ SOLR
500.0000 mg | Freq: Once | INTRAMUSCULAR | Status: AC
  Administered 2019-10-05: 500 mg via INTRAVENOUS
  Filled 2019-10-05: qty 500

## 2019-10-05 NOTE — Progress Notes (Signed)
Patient with new pressure injury, right ear, stage 2, cleansed and foam applied

## 2019-10-05 NOTE — Progress Notes (Signed)
[place] Progress Note Patient Name: Barbara Flowers DOB: [birth year] MRN: [id number]   Date of Service  10/05/2019  HPI/Events of Note  PT needs POC blood sugar monitoring and insulin coverage.  eICU Interventions  Q 4 hours blood sugar monitoring and insulin coverage entered.        Kerry Kass Tige Meas 10/05/2019, 9:16 PM

## 2019-10-05 NOTE — Progress Notes (Addendum)
NAME:  Barbara Flowers, MRN:  [id number], DOB:  Sep 14, 1951, LOS: 42 ADMISSION DATE:  09/16/2019, CONSULTATION DATE:  12/26  REFERRING MD:  Earlie Counts, CHIEF COMPLAINT: Shortness of breath, altered mental status  Brief History   73 yowf never smoker with h/o breast ca s/p bone marrow transplant early 1990s in [place] and cancer free s recent rx but problems with sundowning/ dementia and w/c bound x 3 years p cva acutely worse x one week PTA with cough/ congestion rx over the phone by PCP since 12/21 and gradually worse x 2 days PTA with poor po intake, ams and weakness with mostly mucoid sputum > to ER pm 12/26 with extensive L infiltrate and L effusion with hypoxemia corrected on non rebreathing and PCCM service asked to admit. Signed off to the hospitalist service Was asked to see her again for altered mental status, hypoxemia, shortness of breath Post thoracentesis for about 820 cc of fluid Has MRSA pneumonia  Past Medical History   Past Medical History:  Diagnosis Date   Anemia    Anemia    Anxiety disorder    Blood transfusion without reported diagnosis    Brain cancer [facility])    s/p sterotactic radio surgery   Brain tumor ([place])    Breast cancer ([place])    Cancer ([place]) 1990   L breast   H/O bone marrow transplant ([place])    Hypothyroidism    IBS (irritable bowel syndrome)    MDD (major depressive disorder)    Memory loss    Obesity    Orthostatic hypotension    Seizure ([place])    Seizures ([place])    Stroke ([place]) 2005   hemorrhagic   Stroke ([place])    Subdural hematoma ([place])    Upper brachial plexus paralysis syndrome    Urinary incontinence    Significant Hospital Events   12/28 thora 1/2 thora 1/3 intubated and transferred to ICU 1/4 weaning vent x 6 hours  1/7 weaning OK but copious secretions 1/8 started vanc for MRSA sputum 1/11 not weaning well 1/12-did 30 minutes of pressure support, secretions also slightly improved 1/13 more awake weaning x 2 hrs->spoke w/ family still  want full support 1/14 acceptable rapid shallow. Following commands trial extubation   Consults:  PCCM  Procedures:  1/3 ETT>>>  Significant Diagnostic Tests:  Blood culture 12/28 bedside thoracentesis-200 cc 1/2 thoracentesis-820 cc of fluid drained Chest x-ray 09/24/2019-bilateral pleural effusions, possible left lower lobe infiltrate -ECHO 1/3 LVEF 25-30%, severely decreased LV systolic function. LV grade II diastolic dysfunction. RV severely reduced systolic function. Trivial pericardial effusion , L pleural effusion is seen   1/6 CXR> dense left lower lobe opacity silhouetting the left hemidiaphragm   1/6 upper extremity ultrasounds-no DVTs  Micro Data:  Covid 19 PCR 12/26 >> neg RVP 12/26 >> neg  Procalcitonin 12/26 >> 2.01, down trended to 0.4 with abx BCx2 12/26 >> Pan sensitive Proteus Mirabilis UC 12/26 >> negative Urine legionella 12/26 >>   Urine Pneum Ag 12/26 >> positive Respiratory culture 1/6>> MRSA  Antimicrobials:  Rocephin 12/26 >> (14 day course) 1/8 Azithromycin 12/27-28 Vancomycin 12/26, 1/8>>(day 6)  Interim history/subjective:  No acute events. Does have cough w/ thick secretions  Objective   Blood pressure (Abnormal) 105/38, pulse 73, temperature 97.7 F (36.5 C), temperature source Axillary, resp. rate 20, height 5' 8"  (1.727 m), weight 65 kg, SpO2 99 %.    Vent Mode: PSV FiO2 (%):  [30 %] 30 % Set Rate:  [10 bmp]  10 bmp Vt Set:  [410 mL] 410 mL PEEP:  [5 cmH20] 5 cmH20 Pressure Support:  [8 cmH20] 8 cmH20 Plateau Pressure:  [10 cmH20-14 cmH20] 13 cmH20   Intake/Output Summary (Last 24 hours) at 10/05/2019 0829 Last data filed at 10/05/2019 0700 Gross per 24 hour  Intake 1235 ml  Output 1855 ml  Net -620 ml   Filed Weights   10/03/19 0500 10/04/19 0454 10/05/19 0500  Weight: 65.2 kg 65.2 kg 65 kg    Examination:  General this is a chronically ill appearing white female. She is awake seems withdrawn. Intermittently more interactive.   HENT orally intubated no JVD MMM Pulm clear and w/out accessory use F/Vt in 0-60 range w/ VTs in 300s on PSV 5/peep 5. No accessory use w/ this Card RRR no MRG abd soft not tender + bowel sounds Ext dependent edema/anasarca pulses are palp  Neuro left arm/wrist contracted will move LEs does follow commands. Seems withdrawn  GU clear yellow    Resolved Hospital Problem list   MRSA PNA completed Rx 1/14 Proteus mirabilis bacteremia completed rx 1/8  Assessment & Plan:  Acute hypoxemic and hypercarbic respiratory failure in setting of MRSA pneumonia, poor cough mechanics and atelectasis/mucous plugging  -underlying deconditioning, and predisposition to aspiration likely the basis of her respiratory decline -passing SBT -cough mechanics and weakness will be the primary barrier she will need to overcome if she is to stay extubated Plan Extubate today  Wean oxygen Cont pulse ox  NPO pulm hygiene  NTS if needed  Day 8 vancomycin (will stop today)  Acute metabolic encephalopathy in the setting of critical illness, sepsis/bacteremia superimposed on chronic dementia (w/c bound at baseline), & prior CVA -Ammonia within normal limits -LFTs within normal limits Plan Supportive care  No sedating meds Cont home meds-->aricept and namenda  Taking Narcs off PRN list   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 May hold lasix if cont to have sig metabolic alk  Not a candidate for [facility]   Contraction alkalosis 2/2 diuretics.  Got one dose of diamox yesterday  Plan Repeat chemistry today May hold lasix depending on bicarb   Sacral decubitus -Present on admission Plan Cont wound care    Best practice:  Diet: Continue enteral feeds Pain/Anxiety/Delirium  protocol (if indicated):  PTA xanax, PRN fent, Daily Seroquel. VAP protocol (if indicated): In place DVT prophylaxis: Subcu heparin Mobility: Bedrest Code Status: Full code Family Communication: Will update Disposition: ICU  My cct 34 minutes. Erick Colace ACNP-BC [place] Pager # [phone] OR # [phone] if no answer  Attending: Lorenza Shakir  Subjective: Elderly lady, on vent No overnight events  Objective: Vitals:   10/05/19 0800 10/05/19 1000 10/05/19 1100 10/05/19 1200  BP: (!) 105/38 (!) 141/47  (!) 132/50  Pulse:      Resp: 20 17 16 20   Temp:    (!) 97.2 F (36.2 C)  TempSrc:    Axillary  SpO2:      Weight:      Height:       Frail Moist oral mucosa Endotracheal tube in place Fair air entry bilaterally S1-S2 appreciated Abdomen is soft, bowel sounds appreciated Extremities shows no clubbing  CXR images improvement in chest x-ray findings  Impression/Plan: Acute hypoxemic and hypercarbic respiratory failure  in setting of MRSA pneumonia, poor cough secondary to severe deconditioning Mucous plugging Acute metabolic encephalopathy in the setting of critical illness, dementia Prior CVA 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

## 2019-10-05 NOTE — Plan of Care (Signed)
  Problem: Clinical Measurements: Goal: Respiratory complications will improve Outcome: Progressing   Problem: Nutrition: Goal: Adequate nutrition will be maintained Outcome: Progressing   Problem: Education: Goal: Knowledge of General Education information will improve Description: Including pain rating scale, medication(s)/side effects and non-pharmacologic comfort measures Outcome: Not Progressing   Problem: Health Behavior/Discharge Planning: Goal: Ability to manage health-related needs will improve Outcome: Not Progressing

## 2019-10-05 NOTE — Progress Notes (Signed)
Results for Barbara Flowers, Barbara Flowers (MRN [id number]) as of 10/05/2019 06:19  Ref. Range 10/05/2019 04:28  FIO2 Unknown 30.00  pH, Arterial Latest Ref Range: 7.350 - 7.450  7.497 (H)  pCO2 arterial Latest Ref Range: 32.0 - 48.0 mmHg 44.9  pO2, Arterial Latest Ref Range: 83.0 - 108.0 mmHg 87.1  Acid-Base Excess Latest Ref Range: 0.0 - 2.0 mmol/L 10.3 (H)  Bicarbonate Latest Ref Range: 20.0 - 28.0 mmol/L 34.5 (H)  O2 Saturation Latest Units: % 98.4  Patient temperature Unknown 98.3  Allens test (pass/fail) Latest Ref Range: PASS  PASS  ABG Drawn on VENT 410-VT/10-R/30%FI02/+5 PEEP

## 2019-10-05 NOTE — Procedures (Signed)
Extubation Procedure Note  Patient Details:   Name: Barbara Flowers DOB: 1951/02/19 MRN: [id number]   Airway Documentation:    Vent end date: 10/05/19 Vent end time: 0945   Evaluation  O2 sats: stable throughout Complications: No apparent complications Patient did tolerate procedure well. Bilateral Breath Sounds: Diminished   Yes  Tamera Reason 10/05/2019, 9:45 AM

## 2019-10-05 NOTE — Progress Notes (Signed)
Nutrition Follow-up  DOCUMENTATION CODES:   Non-severe (moderate) malnutrition in context of chronic illness  INTERVENTION:  - will adjust TF regimen: Osmolite 1.2 @ 40 ml/hr to increase by 10 ml every 8 hours to reach goal rate of 60 ml/hr with 30 ml prostat once/day.  - at goal rate, this regimen will provide 1828 kcal, 95 grams protein, and 1181 ml free water.  - free water flush, if desired, to be per MD/NP.    NUTRITION DIAGNOSIS:   Moderate Malnutrition related to chronic illness(wheelchair bound x3 years following CVA) as evidenced by mild fat depletion, mild muscle depletion. -ongoing  GOAL:   Patient will meet greater than or equal to 90% of their needs -to be met with TF regimen  MONITOR:   TF tolerance, Labs, Weight trends, Skin  ASSESSMENT:   69 year-old female with medical history of breast cancer s/p bone marrow transplant early 1990s, problems with sundowning/dementia, and wheelchair bound x3 years following CVA. She presented to the ED due to 1 week of worsening cough and congestion. She was also experiencing poor oral intake, AMS, and increased weakness.  Significant Events: 12/26- admission 12/29- NGT placement; TF initiation 1/3- intubation 1/14- extubation   Patient was extubated at [place] today. NGT remains in place. She is receiving Vital AF 1.2 @ 45 ml/hr with 30 ml prostat once/day. This regimen provides 1396 kcal, 96 grams protein, and 876 ml free water.  Estimated nutrition needs updated following extubation. Will adjust TF regimen as outlined above. Weight has been stable over the past 6 days.   Husband was at bedside and denied any nutrition-related questions or concerns. Able to talk with RN about plan to change TF regimen. RN states plan at this time is to keep NGT in place for the foreseeable future.     Labs reviewed; CBGs: 117, 111, and 103 mg/dl, K: 3.4 mmol/l, Cl: 94 mmol/l, BUN: 49 mg/dl, Ca: 8.6 mg/dl. Medications reviewed; 40 mg IV  lasix/day, 40 mg IV protonix/day, 5 ml senokot per tube BID.    Diet Order:   Diet Order            Diet NPO time specified  Diet effective now              EDUCATION NEEDS:   Not appropriate for education at this time  Skin:  Skin Assessment: Skin Integrity Issues: Skin Integrity Issues:: Other (Comment) Stage I: upper L back Stage II: L buttocks and L thigh Stage III: sacrum Other: venous stasis ulcer to L thigh  Last BM:  1/14  Height:   Ht Readings from Last 1 Encounters:  09/25/19 5' 8"  (1.727 m)    Weight:   Wt Readings from Last 1 Encounters:  10/05/19 65 kg    Ideal Body Weight:  63.6 kg  BMI:  Body mass index is 21.79 kg/m.  Estimated Nutritional Needs:   Kcal:  1800-2000 kcal  Protein:  80-95 grams  Fluid:  >/= 2 L/day     Jarome Matin, MS, RD, LDN, [facility] Inpatient Clinical Dietitian Pager # [phone] After hours/weekend pager # [phone]

## 2019-10-06 LAB — CBC
HCT: 28.7 % — ABNORMAL LOW (ref 36.0–46.0)
Hemoglobin: 8.5 g/dL — ABNORMAL LOW (ref 12.0–15.0)
MCH: 29.5 pg (ref 26.0–34.0)
MCHC: 29.6 g/dL — ABNORMAL LOW (ref 30.0–36.0)
MCV: 99.7 fL (ref 80.0–100.0)
Platelets: 376 10*3/uL (ref 150–400)
RBC: 2.88 MIL/uL — ABNORMAL LOW (ref 3.87–5.11)
RDW: 14.6 % (ref 11.5–15.5)
WBC: 11.3 10*3/uL — ABNORMAL HIGH (ref 4.0–10.5)
nRBC: 0 % (ref 0.0–0.2)

## 2019-10-06 LAB — BASIC METABOLIC PANEL
Anion gap: 11 (ref 5–15)
BUN: 44 mg/dL — ABNORMAL HIGH (ref 8–23)
CO2: 29 mmol/L (ref 22–32)
Calcium: 8.5 mg/dL — ABNORMAL LOW (ref 8.9–10.3)
Chloride: 97 mmol/L — ABNORMAL LOW (ref 98–111)
Creatinine, Ser: 0.46 mg/dL (ref 0.44–1.00)
GFR calc Af Amer: >60 mL/min (ref >60)
GFR calc non Af Amer: >60 mL/min (ref >60)
Glucose, Bld: 130 mg/dL — ABNORMAL HIGH (ref 70–99)
Potassium: 3.5 mmol/L (ref 3.5–5.1)
Sodium: 137 mmol/L (ref 135–145)

## 2019-10-06 LAB — GLUCOSE, CAPILLARY
Glucose-Capillary: 121 mg/dL — ABNORMAL HIGH (ref 70–99)
Glucose-Capillary: 112 mg/dL — ABNORMAL HIGH (ref 70–99)
Glucose-Capillary: 138 mg/dL — ABNORMAL HIGH (ref 70–99)

## 2019-10-06 LAB — MAGNESIUM: Magnesium: 2.1 mg/dL (ref 1.7–2.4)

## 2019-10-06 MED ORDER — CHLORHEXIDINE GLUCONATE 0.12 % MT SOLN
15.0000 mL | Freq: Two times a day (BID) | OROMUCOSAL | Status: DC
  Administered 2019-10-06 – 2019-10-21 (×29): 15 mL via OROMUCOSAL
  Filled 2019-10-06 (×17): qty 15

## 2019-10-06 MED ORDER — ORAL CARE MOUTH RINSE
15.0000 mL | Freq: Two times a day (BID) | OROMUCOSAL | Status: DC
  Administered 2019-10-06 – 2019-10-20 (×27): 15 mL via OROMUCOSAL

## 2019-10-06 MED ORDER — POTASSIUM CHLORIDE 20 MEQ/15ML (10%) PO SOLN
40.0000 meq | Freq: Once | ORAL | Status: AC
  Administered 2019-10-06: 40 meq
  Filled 2019-10-06: qty 30

## 2019-10-06 NOTE — Progress Notes (Signed)
[place] Progress Note Patient Name: Barbara Flowers DOB: 07/21/1951 MRN: [id number]   Date of Service  10/06/2019  HPI/Events of Note  Pt needs order for a.m. labs  eICU Interventions  a.m.  Labs ordered.        Kerry Kass [place] 10/06/2019, 3:38 AM

## 2019-10-06 NOTE — Progress Notes (Signed)
Pt. Nasally suctioned for moderate amounts of thick white secretions.

## 2019-10-06 NOTE — Progress Notes (Addendum)
NAME:  Barbara Flowers, MRN:  [id number], DOB:  12/02/1950, LOS: 85 ADMISSION DATE:  09/16/2019, CONSULTATION DATE:  12/26  REFERRING MD:  Earlie Counts, CHIEF COMPLAINT: Shortness of breath, altered mental status  Brief History   64 yowf never smoker with h/o breast ca s/p bone marrow transplant early 1990s in [place] and cancer free s recent rx but problems with sundowning/ dementia and w/c bound x 3 years p cva acutely worse x one week PTA with cough/ congestion rx over the phone by PCP since 12/21 and gradually worse x 2 days PTA with poor po intake, ams and weakness with mostly mucoid sputum > to ER pm 12/26 with extensive L infiltrate and L effusion with hypoxemia corrected on non rebreathing and PCCM service asked to admit. Signed off to the hospitalist service Was asked to see her again for altered mental status, hypoxemia, shortness of breath Post thoracentesis for about 820 cc of fluid Has MRSA pneumonia  Past Medical History   Past Medical History:  Diagnosis Date  . Anemia   . Anemia   . Anxiety disorder   . Blood transfusion without reported diagnosis   . Brain cancer [facility])    s/p sterotactic radio surgery  . Brain tumor ([place])   . Breast cancer ([place])   . Cancer ([place]) 1990   L breast  . H/O bone marrow transplant ([place])   . Hypothyroidism   . IBS (irritable bowel syndrome)   . MDD (major depressive disorder)   . Memory loss   . Obesity   . Orthostatic hypotension   . Seizure ([place])   . Seizures ([place])   . Stroke [facility]) 2005   hemorrhagic  . Stroke ([place])   . Subdural hematoma ([place])   . Upper brachial plexus paralysis syndrome   . Urinary incontinence    Significant Hospital Events   12/28 thora 1/2 thora 1/3 intubated and transferred to ICU 1/4 weaning vent x 6 hours  1/7 weaning OK but copious secretions 1/8 started vanc for MRSA sputum 1/11 not weaning well 1/12-did 30 minutes of pressure support, secretions also slightly improved 1/13 more awake weaning x 2  hrs->spoke w/ family still want full support 1/14 acceptable rapid shallow. Following commands, extubated.  Sliding scale insulin discontinued as she had been euglycemic not requiring insulin 1/15: looks better ready for floor   Consults:  PCCM  Procedures:  1/3 ETT>>>1/14  Significant Diagnostic Tests:  Blood culture 12/28 bedside thoracentesis-200 cc 1/2 thoracentesis-820 cc of fluid drained Chest x-ray 09/24/2019-bilateral pleural effusions, possible left lower lobe infiltrate -ECHO 1/3 LVEF 25-30%, severely decreased LV systolic function. LV grade II diastolic dysfunction. RV severely reduced systolic function. Trivial pericardial effusion , L pleural effusion is seen   1/6 CXR> dense left lower lobe opacity silhouetting the left hemidiaphragm   1/6 upper extremity ultrasounds-no DVTs  Micro Data:  Covid 19 PCR 12/26 >> neg RVP 12/26 >> neg  Procalcitonin 12/26 >> 2.01, down trended to 0.4 with abx BCx2 12/26 >> Pan sensitive Proteus Mirabilis UC 12/26 >> negative Urine Pneum Ag 12/26 >> positive Respiratory culture 1/6>> MRSA  Antimicrobials:  Rocephin 12/26 >> (14 day course) 1/8 Azithromycin 12/27-28 Vancomycin 12/26, 1/8>>>  Interim history/subjective:  Appears comfortable still weak. Not verbalizing   Objective   Blood pressure (Abnormal) 111/33, pulse (Abnormal) 59, temperature 98.3 F (36.8 C), temperature source Axillary, resp. rate 19, height _0  (1.727 m), weight 61.7 kg, SpO2 100 %. on Room [employer]  Intake/Output Summary (Last 24 hours) at 10/06/2019 0608 Last data filed at 10/06/2019 0600 Gross per 24 hour  Intake 1259 ml  Output 2225 ml  Net -966 ml   Filed Weights   10/04/19 0454 10/05/19 0500 10/06/19 0441  Weight: 65.2 kg 65 kg 61.7 kg   Scheduled Meds: . chlorhexidine gluconate (MEDLINE KIT)  15 mL Mouth Rinse BID  . Chlorhexidine Gluconate Cloth  6 each Topical Daily  . donepezil  10 mg Per Tube QHS  . feeding supplement (PRO-STAT SUGAR  FREE 64)  30 mL Per Tube Daily  . furosemide  40 mg Intravenous Daily  . Gerhardt's butt cream   Topical BID  . heparin  5,000 Units Subcutaneous Q8H  . insulin aspart  2-6 Units Subcutaneous Q4H  . mouth rinse  15 mL Mouth Rinse 10 times per day  . memantine  10 mg Per Tube BID  . midodrine  5 mg Per Tube TID WC  . pantoprazole (PROTONIX) IV  40 mg Intravenous QHS  . sennosides  5 mL Per Tube BID   Continuous Infusions: . feeding supplement (OSMOLITE 1.2 CAL) 1,000 mL (10/05/19 2359)   PRN Meds:.acetaminophen (TYLENOL) oral liquid 160 mg/5 mL, albuterol, docusate, metoprolol tartrate, sodium chloride flush Examination:  General this is a 69 year old debilitated WF she is resting in bed, No distress  HENT NCAT NGT in place MMMM pulm clear currently. Decreased bases no accessory use  Current oxygen requirements: room air sats 100%  Card reg currently NSR abd not tender + bowel sounds Neuro awake, follows simple commands, left sided hemiparesis Ext dependent edema pulses palp  gu clear yellow   Resolved Hospital Problem list   MRSA PNA completed Rx 1/14 Proteus mirabilis bacteremia completed rx 1/8 Contraction alkalosis  Assessment & Plan:  Acute hypoxemic and hypercarbic respiratory failure in setting of MRSA pneumonia, poor cough mechanics and atelectasis/mucous plugging  -underlying deconditioning, and predisposition to aspiration likely the basis of her respiratory decline Successfully extubated on 1/14, still at risk for reintubation given poor cough mechanics and general failure to thrive  Plan Aspiration precautions Pulse oximetry continuous  Pulmonary hygiene with chest physiotherapy NTS as needed Supplemental oxygen with goal of sats > 90% at all times If reintubated would require tracheostomy  Acute metabolic encephalopathy in the setting of critical illness, sepsis/bacteremia superimposed on chronic dementia (w/c bound at baseline), & prior CVA -Ammonia within  normal limits -LFTs within normal limits Plan Stop sedating medications  Continuing home Aricept and Namenda  No narcotics  Supportive care   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 Paroxysmal atrial fibrillation with RVR -Ejection fraction of 25 to 30% Plan Ok to dc tele Dc midodrine  Daily assessment for diuretics Not a candidate for anticoagulation   Intermittent fluid and electrolyte imbalance: hypokalemia Plan Replace and recheck periodically   Protein calorie malnutrition, moderate, likely secondary to chronic illness -Exhibits significant mild muscle depletion Plan Continuing tube feeds, advance as tolerated have discussed potential need for alternative route of nutrition such as PEG tube with husband, will have to evaluate for this in the next few days to come  Sacral decubitus -Present on admission Plan Continue wound care   Best practice:  Diet: Continue enteral feeds Pain/Anxiety/Delirium protocol (if indicated):  PTA xanax, PRN fent, Daily Seroquel. Glycemic control: Has remained euglycemic with no need for insulin.  Will discontinue serial CBGs VAP protocol (if indicated): In place DVT prophylaxis: Subcu heparin Mobility: Bedrest Code Status:  Full code Family Communication: Will update Disposition: medsurg  Erick Colace ACNP-BC [place] Pager # [phone] OR # [phone] if no answer  Pt seen and examined and images reviewed.  She has come a long way since I originally admitted her but is now severely debilitated and at high risk of further complications of being bed ridden including but not limited to asp /hcap and skin breakdown/ recurrent mrsa sepsis.  Agree if issue of reintubation gets considered would need a better understanding from fm re goals of care as likely this would lead to trach/ LTAC in this setting.  Please call if needed    Christinia Gully, MD Pulmonary and [place] [phone] After 5:30 PM or weekends, use Beeper [phone]

## 2019-10-06 NOTE — Progress Notes (Signed)
Husband came to bedside and present for transfer of pt and all belongings

## 2019-10-06 NOTE — Progress Notes (Signed)
Attempted to call husband to notify of pt transfer. Husband did not answer and voicemail box not set up

## 2019-10-06 NOTE — Progress Notes (Signed)
   Vital Signs MEWS/VS Documentation      10/06/2019 1000 10/06/2019 1200 10/06/2019 1500 10/06/2019 1700   MEWS Score:  3  1  3  1    MEWS Score Color:  Yellow  Green  Yellow  Green   Resp:  (!) 23  (!) 25  --  --   Pulse:  --  --  --  67   BP:  (!) 140/49  (!) 145/50  --  --   Level of Consciousness:  --  --  Responds to Pain  --      Not an acute change.     Gavan Nordby D Stokley Chandler 10/06/2019,7:20 PM

## 2019-10-07 ENCOUNTER — Inpatient Hospital Stay (HOSPITAL_COMMUNITY): Payer: Medicare Other

## 2019-10-07 LAB — BASIC METABOLIC PANEL
Anion gap: 11 (ref 5–15)
BUN: 47 mg/dL — ABNORMAL HIGH (ref 8–23)
CO2: 26 mmol/L (ref 22–32)
Calcium: 8.5 mg/dL — ABNORMAL LOW (ref 8.9–10.3)
Chloride: 97 mmol/L — ABNORMAL LOW (ref 98–111)
Creatinine, Ser: 0.57 mg/dL (ref 0.44–1.00)
GFR calc Af Amer: >60 mL/min (ref >60)
GFR calc non Af Amer: >60 mL/min (ref >60)
Glucose, Bld: 132 mg/dL — ABNORMAL HIGH (ref 70–99)
Potassium: 4.4 mmol/L (ref 3.5–5.1)
Sodium: 134 mmol/L — ABNORMAL LOW (ref 135–145)

## 2019-10-07 LAB — GLUCOSE, CAPILLARY: Glucose-Capillary: 142 mg/dL — ABNORMAL HIGH (ref 70–99)

## 2019-10-07 LAB — MAGNESIUM: Magnesium: 2.3 mg/dL (ref 1.7–2.4)

## 2019-10-07 NOTE — Progress Notes (Signed)
PROGRESS NOTE    Barbara Flowers  [id number] DOB: 1951/08/10 DOA: 09/16/2019 PCP: [name], NP    Brief [id number] with hx breast cancer, dementia, hx prior CVA presented on 12/26 with extensive L sided infiltrate and effusion with hypoxemia, requiring nonrebreather. Pt was initially admitted to [facility]:   Active Problems:   Dementia ([place])   CAP (community acquired pneumonia)   Pleural effusion on left   Acute prerenal azotemia   Hypernatremia   Pressure injury of skin   Malnutrition of moderate degree   Acute hypoxemic respiratory failure (HCC)   Pleural effusion   Acute systolic heart failure (HCC)  Acute hypoxemic and hypercarbic respiratory failure in setting of MRSA pneumonia, poor cough mechanics and atelectasis/mucous plugging  -worsening pulm status despite thoracentesis x 2 and requiring intubation for airway protection, transferring service to CCM -Pt ultimately able to be extubated on 1/14. Per PCCM, still at risk for reintubation given poor cough mechanics and general failure to thrive -Will continue chest PT and suctioning as needed per RT -Per Pulmonary, if reintubated would require tracheostomy  Acute metabolic encephalopathy in the setting of critical illness, sepsis/bacteremia superimposed on chronic dementia (w/c bound at baseline), & prior CVA -Ammonia within normal limits, reviewed -LFTs within normal limits -Would cont to avoid sedating meds -This AM, poorly responsive to painful stimuli -Have ordered and reviewed head CT with no acute findings. Advanced generalized brain atrophy and extensive chronic ischemic changes throughout the brain noted. 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 Paroxysmal atrial fibrillation with RVR -Ejection fraction of 25 to 30% -Prior to intubation, pt  noted to have generalized edema and anasarca -On transfer out of unit, clinically appears euvolemic with edema much improved  Intermittent fluid and electrolyte imbalance: hypokalemia Plan -replaced -repeat bmet in AM  Protein calorie malnutrition, moderate, likely secondary to chronic illness -Exhibits significant mild muscle depletion -Currently with NG tube feeding -If little to no improvement in mentation, would have to consider PEG feeding as discussed by PCCM  Sacral decubitus -Present on admission -WOC  DVT prophylaxis: Heparin subq Code Status: Full Family Communication: Pt in room, family not at bedside Disposition Plan: Uncertain at this time  Consultants:   PCCM  Procedures:  12/28 thora 1/2 thora 1/3 intubated and transferred to ICU 1/4 weaning vent x 6 hours  1/7 weaning OK but copious secretions 1/8 started vanc for MRSA sputum 1/11 not weaning well 1/12-did 30 minutes of pressure support, secretions also slightly improved 1/13 more awake weaning x 2 hrs->spoke w/ family still want full support 1/14 acceptable rapid shallow. Following commands, extubated.  Sliding scale insulin discontinued as she had been euglycemic not requiring insulin  Antimicrobials: Anti-infectives (From admission, onward)   Start     Dose/Rate Route Frequency Ordered Stop   10/02/19 2100  vancomycin (VANCOCIN) IVPB 1000 mg/200 mL premix  Status:  Discontinued     1,000 mg 200 mL/hr over 60 Minutes Intravenous Every 24 hours 10/02/19 1404 10/05/19 0849   09/29/19 1400  vancomycin (VANCOREADY) IVPB 1500 mg/300 mL  Status:  Discontinued     1,500 mg 150 mL/hr over 120 Minutes Intravenous Every 24 hours 09/28/19 1228 10/02/19 1404   09/28/19 1300  vancomycin (VANCOREADY) IVPB 1750 mg/350 mL     1,750 mg 175 mL/hr over 120 Minutes Intravenous  Once 09/28/19 1228 09/28/19 1451  09/17/19 1000  azithromycin (ZITHROMAX) 500 mg in sodium chloride 0.9 % 250 mL IVPB  Status:   Discontinued     500 mg 250 mL/hr over 60 Minutes Intravenous Every 24 hours 09/17/19 0920 09/18/19 1157   09/16/19 2200  cefTRIAXone (ROCEPHIN) 2 g in sodium chloride 0.9 % 100 mL IVPB     2 g 200 mL/hr over 30 Minutes Intravenous Every 24 hours 09/16/19 1745 09/29/19 2216   09/16/19 1745  cefTRIAXone (ROCEPHIN) injection 1 g  Status:  Discontinued     1 g Intramuscular Every 24 hours 09/16/19 1740 09/16/19 1743   09/16/19 1515  ceFEPIme (MAXIPIME) 2 g in sodium chloride 0.9 % 100 mL IVPB     2 g 200 mL/hr over 30 Minutes Intravenous  Once 09/16/19 1513 09/16/19 1834   09/16/19 1515  metroNIDAZOLE (FLAGYL) IVPB 500 mg  Status:  Discontinued     500 mg 100 mL/hr over 60 Minutes Intravenous  Once 09/16/19 1513 09/16/19 1741   09/16/19 1515  vancomycin (VANCOCIN) IVPB 1000 mg/200 mL premix  Status:  Discontinued     1,000 mg 200 mL/hr over 60 Minutes Intravenous  Once 09/16/19 1513 09/16/19 1834       Subjective: Unable to assess given current mentation  Objective: Vitals:   10/07/19 0729 10/07/19 0951 10/07/19 1448 10/07/19 1517  BP: 133/62 (!) 162/54 (!) 155/70 (!) 128/59  Pulse: 82 87 84 78  Resp: (!) 33 (!) 25 (!) 31 (!) 28  Temp: 97.9 F (36.6 C) (!) 97.2 F (36.2 C) 97.9 F (36.6 C) 98.6 F (37 C)  TempSrc: Axillary Axillary Axillary Axillary  SpO2: 100% 99% 100% 99%  Weight:      Height:        Intake/Output Summary (Last 24 hours) at 10/07/2019 1529 Last data filed at 10/07/2019 1441 Gross per 24 hour  Intake 1102 ml  Output 2150 ml  Net -1048 ml   Filed Weights   10/05/19 0500 10/06/19 0441 10/07/19 0500  Weight: 65 kg 61.7 kg 58 kg    Examination:  General exam: Appears calm and comfortable  Respiratory system: Clear to auscultation. Respiratory effort normal. Cardiovascular system: S1 & S2 heard, Regular Gastrointestinal system: Abdomen is nondistended, soft and nontender. Central nervous system: Lethargic, R UE contracture noted, no  tremors. Extremities: Perfused, no clubbing Skin: No rashes, lesions  Psychiatry: Unable to assess given current mentation   Data Reviewed: I have personally reviewed following labs and imaging studies  CBC: Recent Labs  Lab 10/01/19 0906 10/02/19 0152 10/03/19 0312 10/06/19 0609  WBC 14.3* 14.3* 13.9* 11.3*  HGB 8.4* 8.6* 8.6* 8.5*  HCT 27.1* 28.0* 28.2* 28.7*  MCV 97.1 97.2 96.9 99.7  PLT 291 353 365 [id number]   Basic Metabolic Panel: Recent Labs  Lab 10/01/19 0906 10/01/19 0906 10/02/19 0152 10/02/19 0152 10/03/19 0312 10/05/19 0205 10/05/19 0905 10/06/19 0609 10/07/19 0417  NA 135   < > 138  --  138  --  137 137 134*  K 3.7   < > 4.6  --  3.9  --  3.4* 3.5 4.4  CL 95*   < > 96*  --  93*  --  94* 97* 97*  CO2 30   < > 29  --  32  --  31 29 26   GLUCOSE 123*   < > 109*  --  115*  --  114* 130* 132*  BUN 45*   < > 46*  --  49*  --  49* 44* 47*  CREATININE 0.45   < > 0.54   < > 0.58 0.56 0.57 0.46 0.57  CALCIUM 8.3*   < > 8.6*  --  8.9  --  8.6* 8.5* 8.5*  MG 2.0  --  2.1  --   --   --   --  2.1 2.3   < > = values in this interval not displayed.   GFR: Estimated Creatinine Clearance: 60.8 mL/min (by C-G formula based on SCr of 0.57 mg/dL). Liver Function Tests: Recent Labs  Lab 10/01/19 0906 10/02/19 0152 10/03/19 0312  AST 20 27 39  ALT 24 27 35  ALKPHOS 76 79 81  BILITOT 0.7 0.6 0.7  PROT 6.0* 6.2* 6.5  ALBUMIN 2.4* 2.4* 2.5*   No results for input(s): LIPASE, AMYLASE in the last 168 hours. No results for input(s): AMMONIA in the last 168 hours. Coagulation Profile: No results for input(s): INR, PROTIME in the last 168 hours. Cardiac Enzymes: No results for input(s): CKTOTAL, CKMB, CKMBINDEX, TROPONINI in the last 168 hours. BNP (last 3 results) No results for input(s): PROBNP in the last 8760 hours. HbA1C: No results for input(s): HGBA1C in the last 72 hours. CBG: Recent Labs  Lab 10/05/19 2051 10/05/19 2331 10/06/19 0323 10/06/19 0929  10/07/19 0342  GLUCAP 108* 112* 121* 138* 142*   Lipid Profile: No results for input(s): CHOL, HDL, LDLCALC, TRIG, CHOLHDL, LDLDIRECT in the last 72 hours. Thyroid Function Tests: No results for input(s): TSH, T4TOTAL, FREET4, T3FREE, THYROIDAB in the last 72 hours. Anemia Panel: No results for input(s): VITAMINB12, FOLATE, FERRITIN, TIBC, IRON, RETICCTPCT in the last 72 hours. Sepsis Labs: No results for input(s): PROCALCITON, LATICACIDVEN in the last 168 hours.  No results found for this or any previous visit (from the past 240 hour(s)).   Radiology Studies: CT HEAD WO CONTRAST  Result Date: 10/07/2019 CLINICAL DATA:  Subacute neurological deficit. EXAM: CT HEAD WITHOUT CONTRAST TECHNIQUE: Contiguous axial images were obtained from the base of the skull through the vertex without intravenous contrast. COMPARISON:  10/25/2017 FINDINGS: Brain: Generalized brain atrophy. Chronic small-vessel ischemic changes the pons. Old left cerebellar infarction. Old infarction left thalamus. Extensive chronic small-vessel ischemic changes throughout the brain. Previous right parietal craniotomy with underlying encephalomalacia and some parenchymal calcification. Atrophic changes are somewhat progressive since 2019 but I do not see an acute insult. Vascular: There is atherosclerotic calcification of the major vessels at the base of the brain. Skull: Right parietal craniotomy as noted above. Sinuses/Orbits: Clear/normal Other: None IMPRESSION: No acute finding by CT. Advanced generalized brain atrophy. Extensive chronic ischemic changes throughout the brain as outlined above. Old right parietal craniotomy with underlying chondromalacia and some parenchymal calcification. Electronically Signed   By: Nelson Chimes M.D.   On: 10/07/2019 14:24   DG Abd Portable 1V  Result Date: 10/07/2019 CLINICAL DATA:  Nasogastric tube position. EXAM: PORTABLE ABDOMEN - 1 VIEW COMPARISON:  September 24, 2019. FINDINGS: The bowel gas  pattern is normal. Distal tip of nasogastric tube is seen in the stomach. Status post cholecystectomy. Anastomotic sutures are noted in the left upper quadrant. No radio-opaque calculi or other significant radiographic abnormality are seen. IMPRESSION: Distal tip of nasogastric tube seen in the stomach. No evidence of bowel obstruction or ileus. Electronically Signed   By: Marijo Conception M.D.   On: 10/07/2019 09:50    Scheduled Meds: . chlorhexidine  15 mL Mouth Rinse BID  . Chlorhexidine Gluconate Cloth  6 each Topical Daily  .  donepezil  10 mg Per Tube QHS  . feeding supplement (PRO-STAT SUGAR FREE 64)  30 mL Per Tube Daily  . furosemide  40 mg Intravenous Daily  . Gerhardt's butt cream   Topical BID  . heparin  5,000 Units Subcutaneous Q8H  . mouth rinse  15 mL Mouth Rinse q12n4p  . memantine  10 mg Per Tube BID  . sennosides  5 mL Per Tube BID   Continuous Infusions: . feeding supplement (OSMOLITE 1.2 CAL) 60 mL/hr at 10/07/19 0920     LOS: 21 days   Marylu Lund, MD [facility] Pager On Amion  If 7PM-7AM, please contact night-coverage 10/07/2019, 3:29 PM

## 2019-10-07 NOTE — Progress Notes (Signed)
Pt sats 97% HR 70 Pt lung sounds are rhonchi. Suctioned pt nasally. Moderate tan secretion were removed. Pt in no distress.

## 2019-10-07 NOTE — Progress Notes (Signed)
Notified by Abby, 4th floor RN in regards to MEWS of 4. Patient's MEWS was increased due to decrease in LOC. Upon arrival patient able to open eyes to voice. Patient appears to be hard of hearing. Patient is not in respiratory distress. If patient has a change in clinical status or change in mental status. Call Rapid Response at [phone].

## 2019-10-07 NOTE — Progress Notes (Signed)
RT called to pt room for PRN NTS. RT listened to pt before hand and heard Ronchi in the upper airway. RT NTS pt and was able to get a small amount of white/yellow, thick sputum. Pt saturations did not drop during treatment. RT listened to pt again afterwards and pt sounded better. RT will continue to monitor pt status.

## 2019-10-07 NOTE — Progress Notes (Signed)
Called for RRT d/t pt having Bigeminy on monitor.  Pt assessed and pt followed some simple commands.  VSS. Pt glucose obtained. EKG complete and in chart.  TRIAD1 made aware of pt status.  New orders received and initiated.

## 2019-10-07 NOTE — Progress Notes (Addendum)
Central telemetry called and nurse and told nurse that patient had trigeminy with PVCs.  Nurse attempted to call critical care at e-link, but patient then had PVCs showing on monitor so EKG was done and Rapid response nurse was called.  She came in and assessed patient, She then communicated with NP about patient's status relating to the event  trigeminy with PVC.  NP ordered BMP with Mag levels; which has been drawn.

## 2019-10-07 NOTE — Plan of Care (Addendum)
While I was on lunch break, Len Blalock, RN was watching this patient. A vitals check  changed her to Red mews.  Hosp. And critical care Dr. Ree Kida of status change.    CXR ordered and Drs will also round to assess.  Mews triggered from high RR and decreased changed in alertness. Though the mews officially changed now - Dr. Wyline Copas had already expressed concerns about a decreased alertness and therefore ordered and completed a CT to re-assess this AM.  Rapid Response RN also notified and rounded on patient.  Patient has appeared more congested and needing even needed resp to assist with deep suctioning - However, her RR has maintained a higher rate for several days.  Other than decreased alertness - patient remains asymptomatic to her high RR - w/ SPO2 at 95 or above.  Patient displays not respiratory distress.  Husband arrived in room to visit about 1530 and is aware.  The patient is currently a Full code and therefore we we will respond as this situation requires.

## 2019-10-08 LAB — COMPREHENSIVE METABOLIC PANEL
ALT: 53 U/L — ABNORMAL HIGH (ref 0–44)
AST: 50 U/L — ABNORMAL HIGH (ref 15–41)
Albumin: 2.5 g/dL — ABNORMAL LOW (ref 3.5–5.0)
Alkaline Phosphatase: 89 U/L (ref 38–126)
Anion gap: 10 (ref 5–15)
BUN: 47 mg/dL — ABNORMAL HIGH (ref 8–23)
CO2: 29 mmol/L (ref 22–32)
Calcium: 8.7 mg/dL — ABNORMAL LOW (ref 8.9–10.3)
Chloride: 100 mmol/L (ref 98–111)
Creatinine, Ser: 0.53 mg/dL (ref 0.44–1.00)
GFR calc Af Amer: >60 mL/min (ref >60)
GFR calc non Af Amer: >60 mL/min (ref >60)
Glucose, Bld: 140 mg/dL — ABNORMAL HIGH (ref 70–99)
Potassium: 4.4 mmol/L (ref 3.5–5.1)
Sodium: 139 mmol/L (ref 135–145)
Total Bilirubin: 0.7 mg/dL (ref 0.3–1.2)
Total Protein: 6.8 g/dL (ref 6.5–8.1)

## 2019-10-08 LAB — CBC
HCT: 27.4 % — ABNORMAL LOW (ref 36.0–46.0)
Hemoglobin: 8.3 g/dL — ABNORMAL LOW (ref 12.0–15.0)
MCH: 29.2 pg (ref 26.0–34.0)
MCHC: 30.3 g/dL (ref 30.0–36.0)
MCV: 96.5 fL (ref 80.0–100.0)
Platelets: 390 10*3/uL (ref 150–400)
RBC: 2.84 MIL/uL — ABNORMAL LOW (ref 3.87–5.11)
RDW: 14.5 % (ref 11.5–15.5)
WBC: 14 10*3/uL — ABNORMAL HIGH (ref 4.0–10.5)
nRBC: 0 % (ref 0.0–0.2)

## 2019-10-08 NOTE — Progress Notes (Signed)
PROGRESS NOTE    Barbara Flowers  [id number] DOB: [birth year] DOA: 09/16/2019 PCP: [name], NP    Brief NarrativeBP:[id number] with hx breast cancer, dementia, hx prior CVA presented on 12/26 with extensive L sided infiltrate and effusion with hypoxemia, requiring nonrebreather. Pt was initially admitted to [place]:   Active Problems:   Dementia ([place])   CAP (community acquired pneumonia)   Pleural effusion on left   Acute prerenal azotemia   Hypernatremia   Pressure injury of skin   Malnutrition of moderate degree   Acute hypoxemic respiratory failure (HCC)   Pleural effusion   Acute systolic heart failure (HCC)  Acute hypoxemic and hypercarbic respiratory failure in setting of MRSA pneumonia, poor cough mechanics and atelectasis/mucous plugging  -worsening pulm status despite thoracentesis x 2 and requiring intubation for airway protection, transferring service to CCM -Pt ultimately able to be extubated on 1/14. Per PCCM, still at risk for reintubation given poor cough mechanics and general failure to thrive -Will continue chest PT and suctioning as needed per RT -Per Pulmonary, if reintubated would require tracheostomy. Respiratory status remains stable  Acute metabolic encephalopathy in the setting of critical illness, sepsis/bacteremia superimposed on chronic dementia (w/c bound at baseline), & prior CVA -Ammonia within normal limits, reviewed -LFTs within normal limits -Would cont to avoid sedating meds -This morning, remains poorly responsive to painful stimuli -Have ordered and reviewed head CT with no acute findings. Advanced generalized brain atrophy and extensive chronic ischemic changes throughout the brain noted. 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 Paroxysmal atrial fibrillation with RVR -Ejection  fraction of 25 to 30% -Prior to intubation, pt noted to have generalized edema and anasarca -On transfer out of unit, clinically appears euvolemic with edema much improved -Seems euvolemic  Intermittent fluid and electrolyte imbalance: hypokalemia Plan -replaced -recheck bmet in AM  Protein calorie malnutrition, moderate, likely secondary to chronic illness -Exhibits significant mild muscle depletion -Currently with NG tube feeding -If little to no improvement in mentation, would have to consider PEG feeding as discussed by PCCM  Sacral decubitus -Present on admission -WOC  DVT prophylaxis: Heparin subq Code Status: Full Family Communication: Pt in room, family not at bedside Disposition Plan: Uncertain at this time  Consultants:   PCCM  Procedures:  12/28 thora 1/2 thora 1/3 intubated and transferred to ICU 1/4 weaning vent x 6 hours  1/7 weaning OK but copious secretions 1/8 started vanc for MRSA sputum 1/11 not weaning well 1/12-did 30 minutes of pressure support, secretions also slightly improved 1/13 more awake weaning x 2 hrs->spoke w/ family still want full support 1/14 acceptable rapid shallow. Following commands, extubated.  Sliding scale insulin discontinued as she had been euglycemic not requiring insulin  Antimicrobials: Anti-infectives (From admission, onward)   Start     Dose/Rate Route Frequency Ordered Stop   10/02/19 2100  vancomycin (VANCOCIN) IVPB 1000 mg/200 mL premix  Status:  Discontinued     1,000 mg 200 mL/hr over 60 Minutes Intravenous Every 24 hours 10/02/19 1404 10/05/19 0849   09/29/19 1400  vancomycin (VANCOREADY) IVPB 1500 mg/300 mL  Status:  Discontinued     1,500 mg 150 mL/hr over 120 Minutes Intravenous Every 24 hours 09/28/19 1228 10/02/19 1404   09/28/19 1300  vancomycin (VANCOREADY) IVPB 1750 mg/350 mL     1,750 mg 175 mL/hr over 120 Minutes Intravenous  Once 09/28/19 1228 09/28/19 1451   09/17/19 1000  azithromycin  (ZITHROMAX) 500 mg in sodium chloride 0.9 % 250 mL IVPB  Status:  Discontinued     500 mg 250 mL/hr over 60 Minutes Intravenous Every 24 hours 09/17/19 0920 09/18/19 1157   09/16/19 2200  cefTRIAXone (ROCEPHIN) 2 g in sodium chloride 0.9 % 100 mL IVPB     2 g 200 mL/hr over 30 Minutes Intravenous Every 24 hours 09/16/19 1745 09/29/19 2216   09/16/19 1745  cefTRIAXone (ROCEPHIN) injection 1 g  Status:  Discontinued     1 g Intramuscular Every 24 hours 09/16/19 1740 09/16/19 1743   09/16/19 1515  ceFEPIme (MAXIPIME) 2 g in sodium chloride 0.9 % 100 mL IVPB     2 g 200 mL/hr over 30 Minutes Intravenous  Once 09/16/19 1513 09/16/19 1834   09/16/19 1515  metroNIDAZOLE (FLAGYL) IVPB 500 mg  Status:  Discontinued     500 mg 100 mL/hr over 60 Minutes Intravenous  Once 09/16/19 1513 09/16/19 1741   09/16/19 1515  vancomycin (VANCOCIN) IVPB 1000 mg/200 mL premix  Status:  Discontinued     1,000 mg 200 mL/hr over 60 Minutes Intravenous  Once 09/16/19 1513 09/16/19 1834      Subjective: Cannot assess given current mentation  Objective: Vitals:   10/08/19 0500 10/08/19 0815 10/08/19 1143 10/08/19 1527  BP:  (!) 116/51 (!) 125/50   Pulse:  65 81 85  Resp:  (!) 22 20   Temp:  99.6 F (37.6 C) 99.4 F (37.4 C) 99.5 F (37.5 C)  TempSrc:  Axillary Axillary Axillary  SpO2:  95% 100% 98%  Weight: 56.7 kg     Height:        Intake/Output Summary (Last 24 hours) at 10/08/2019 1552 Last data filed at 10/08/2019 1536 Gross per 24 hour  Intake 1540 ml  Output 1050 ml  Net 490 ml   Filed Weights   10/06/19 0441 10/07/19 0500 10/08/19 0500  Weight: 61.7 kg 58 kg 56.7 kg    Examination: General exam: Asleep, laying in bed, in nad Respiratory system: Normal respiratory effort, no wheezing Cardiovascular system: regular rate, s1, s2 Gastrointestinal system: Soft, nondistended, positive BS Central nervous system: CN2-12 grossly intact, strength intact Extremities: Perfused, no  clubbing Skin: Normal skin turgor, no notable skin lesions seen Psychiatry: Unable to assess given mentation   Data Reviewed: I have personally reviewed following labs and imaging studies  CBC: Recent Labs  Lab 10/02/19 0152 10/03/19 0312 10/06/19 0609 10/08/19 0416  WBC 14.3* 13.9* 11.3* 14.0*  HGB 8.6* 8.6* 8.5* 8.3*  HCT 28.0* 28.2* 28.7* 27.4*  MCV 97.2 96.9 99.7 96.5  PLT 353 365 376 [id number]   Basic Metabolic Panel: Recent Labs  Lab 10/02/19 0152 10/02/19 0152 10/03/19 0312 10/03/19 0312 10/05/19 0205 10/05/19 0905 10/06/19 0609 10/07/19 0417 10/08/19 0416  NA 138   < > 138  --   --  137 137 134* 139  K 4.6   < > 3.9  --   --  3.4* 3.5 4.4 4.4  CL 96*   < > 93*  --   --  94* 97* 97* 100  CO2 29   < > 32  --   --  31 29 26 29   GLUCOSE 109*   < > 115*  --   --  114* 130* 132* 140*  BUN 46*   < > 49*  --   --  49* 44* 47* 47*  CREATININE 0.54   < > 0.58   < > 0.56 0.57 0.46 0.57 0.53  CALCIUM 8.6*   < > 8.9  --   --  8.6* 8.5* 8.5* 8.7*  MG 2.1  --   --   --   --   --  2.1 2.3  --    < > = values in this interval not displayed.   GFR: Estimated Creatinine Clearance: 59.4 mL/min (by C-G formula based on SCr of 0.53 mg/dL). Liver Function Tests: Recent Labs  Lab 10/02/19 0152 10/03/19 0312 10/08/19 0416  AST 27 39 50*  ALT 27 35 53*  ALKPHOS 79 81 89  BILITOT 0.6 0.7 0.7  PROT 6.2* 6.5 6.8  ALBUMIN 2.4* 2.5* 2.5*   No results for input(s): LIPASE, AMYLASE in the last 168 hours. No results for input(s): AMMONIA in the last 168 hours. Coagulation Profile: No results for input(s): INR, PROTIME in the last 168 hours. Cardiac Enzymes: No results for input(s): CKTOTAL, CKMB, CKMBINDEX, TROPONINI in the last 168 hours. BNP (last 3 results) No results for input(s): PROBNP in the last 8760 hours. HbA1C: No results for input(s): HGBA1C in the last 72 hours. CBG: Recent Labs  Lab 10/05/19 2051 10/05/19 2331 10/06/19 0323 10/06/19 0929 10/07/19 0342  GLUCAP  108* 112* 121* 138* 142*   Lipid Profile: No results for input(s): CHOL, HDL, LDLCALC, TRIG, CHOLHDL, LDLDIRECT in the last 72 hours. Thyroid Function Tests: No results for input(s): TSH, T4TOTAL, FREET4, T3FREE, THYROIDAB in the last 72 hours. Anemia Panel: No results for input(s): VITAMINB12, FOLATE, FERRITIN, TIBC, IRON, RETICCTPCT in the last 72 hours. Sepsis Labs: No results for input(s): PROCALCITON, LATICACIDVEN in the last 168 hours.  No results found for this or any previous visit (from the past 240 hour(s)).   Radiology Studies: CT HEAD WO CONTRAST  Result Date: 10/07/2019 CLINICAL DATA:  Subacute neurological deficit. EXAM: CT HEAD WITHOUT CONTRAST TECHNIQUE: Contiguous axial images were obtained from the base of the skull through the vertex without intravenous contrast. COMPARISON:  10/25/2017 FINDINGS: Brain: Generalized brain atrophy. Chronic small-vessel ischemic changes the pons. Old left cerebellar infarction. Old infarction left thalamus. Extensive chronic small-vessel ischemic changes throughout the brain. Previous right parietal craniotomy with underlying encephalomalacia and some parenchymal calcification. Atrophic changes are somewhat progressive since 2019 but I do not see an acute insult. Vascular: There is atherosclerotic calcification of the major vessels at the base of the brain. Skull: Right parietal craniotomy as noted above. Sinuses/Orbits: Clear/normal Other: None IMPRESSION: No acute finding by CT. Advanced generalized brain atrophy. Extensive chronic ischemic changes throughout the brain as outlined above. Old right parietal craniotomy with underlying chondromalacia and some parenchymal calcification. Electronically Signed   By: Nelson Chimes M.D.   On: 10/07/2019 14:24   DG CHEST PORT 1 VIEW  Result Date: 10/07/2019 CLINICAL DATA:  Follow-up pleural effusion EXAM: PORTABLE CHEST 1 VIEW COMPARISON:  10/04/2019 FINDINGS: There is been interval removal of  endotracheal tube. Enteric tube courses below the diaphragm with distal tip beyond the inferior margin of the film. Stable heart size. Persistent hazy opacity in the left lung base, which is similar to slightly worsened compared to the prior study. No appreciable pleural fluid collection on frontal view. Right lung is clear. Surgical clips in the left axilla and multiple left breast calcifications, which project over the left costophrenic angle. Severe degenerative changes of the left shoulder. IMPRESSION: 1. Persistent hazy opacity in the left lung base, similar to slightly worsened  compared to the prior study. 2. Interval removal of endotracheal tube. Electronically Signed   By: Davina Poke D.O.   On: 10/07/2019 17:28   DG Shoulder Left  Result Date: 10/07/2019 CLINICAL DATA:  Shoulder pain EXAM: LEFT SHOULDER - 2+ VIEW COMPARISON:  Chest x-ray 08/31/2015 FINDINGS: Severe degenerative changes of the left glenohumeral joint with complete joint space loss and remodeling of the bony glenoid. Humeral head is high-riding with remodeled appearance of the distal acromion compatible with chronic complete underlying rotator cuff tear. Abnormal rotation of the scapula is unchanged compared to the remote prior studies. No fractures identified. No definite dislocation is appreciated on the provided views. Bony structures are diffusely demineralized. Surgical clips in the left axilla. No regional soft tissue swelling. IMPRESSION: Chronic severe degenerative changes of the left shoulder with remodeling of the glenoid and acromion. Humeral head is high-riding without evidence of dislocation on the provided views. Electronically Signed   By: Davina Poke D.O.   On: 10/07/2019 16:19   DG Abd Portable 1V  Result Date: 10/07/2019 CLINICAL DATA:  Nasogastric tube position. EXAM: PORTABLE ABDOMEN - 1 VIEW COMPARISON:  September 24, 2019. FINDINGS: The bowel gas pattern is normal. Distal tip of nasogastric tube is seen in  the stomach. Status post cholecystectomy. Anastomotic sutures are noted in the left upper quadrant. No radio-opaque calculi or other significant radiographic abnormality are seen. IMPRESSION: Distal tip of nasogastric tube seen in the stomach. No evidence of bowel obstruction or ileus. Electronically Signed   By: Marijo Conception M.D.   On: 10/07/2019 09:50    Scheduled Meds: . chlorhexidine  15 mL Mouth Rinse BID  . Chlorhexidine Gluconate Cloth  6 each Topical Daily  . donepezil  10 mg Per Tube QHS  . feeding supplement (PRO-STAT SUGAR FREE 64)  30 mL Per Tube Daily  . furosemide  40 mg Intravenous Daily  . Gerhardt's butt cream   Topical BID  . heparin  5,000 Units Subcutaneous Q8H  . mouth rinse  15 mL Mouth Rinse q12n4p  . memantine  10 mg Per Tube BID  . sennosides  5 mL Per Tube BID   Continuous Infusions: . feeding supplement (OSMOLITE 1.2 CAL) 1,000 mL (10/08/19 0428)     LOS: 22 days   Marylu Lund, MD [facility] Pager On Amion  If 7PM-7AM, please contact night-coverage 10/08/2019, 3:52 PM

## 2019-10-08 NOTE — Progress Notes (Signed)
Patients RR 22, temp 99.6, BP 116/51, patient is minimally responsive. Dr. Wyline Copas made aware and stated to hold 40mg  IV lasix dose for 10am. Will continue with plan of care.

## 2019-10-08 NOTE — Progress Notes (Signed)
RT NT suctioned and suctioned out mouth of patient and got thick tan secretions. Patient tolerated well.

## 2019-10-09 ENCOUNTER — Inpatient Hospital Stay (HOSPITAL_COMMUNITY): Payer: Medicare Other

## 2019-10-09 DIAGNOSIS — R627 Adult failure to thrive: Secondary | ICD-10-CM | POA: Insufficient documentation

## 2019-10-09 LAB — BLOOD GAS, ARTERIAL
Acid-Base Excess: 5.6 mmol/L — ABNORMAL HIGH (ref 0.0–2.0)
Acid-Base Excess: 5 mmol/L — ABNORMAL HIGH (ref 0.0–2.0)
Bicarbonate: 27.9 mmol/L (ref 20.0–28.0)
Bicarbonate: 28.7 mmol/L — ABNORMAL HIGH (ref 20.0–28.0)
Drawn by: 295031
Drawn by: 270211
FIO2: 68
FIO2: 48
O2 Content: 12 L/min
O2 Saturation: 89 %
O2 Saturation: 97.8 %
Patient temperature: 98.6
Patient temperature: 98.6
pCO2 arterial: 34 mmHg (ref 32.0–48.0)
pCO2 arterial: 40.7 mmHg (ref 32.0–48.0)
pH, Arterial: 7.525 — ABNORMAL HIGH (ref 7.350–7.450)
pH, Arterial: 7.463 — ABNORMAL HIGH (ref 7.350–7.450)
pO2, Arterial: 56.9 mmHg — ABNORMAL LOW (ref 83.0–108.0)
pO2, Arterial: 119 mmHg — ABNORMAL HIGH (ref 83.0–108.0)

## 2019-10-09 LAB — CBC
HCT: 30.1 % — ABNORMAL LOW (ref 36.0–46.0)
Hemoglobin: 9 g/dL — ABNORMAL LOW (ref 12.0–15.0)
MCH: 29.2 pg (ref 26.0–34.0)
MCHC: 29.9 g/dL — ABNORMAL LOW (ref 30.0–36.0)
MCV: 97.7 fL (ref 80.0–100.0)
Platelets: 443 10*3/uL — ABNORMAL HIGH (ref 150–400)
RBC: 3.08 MIL/uL — ABNORMAL LOW (ref 3.87–5.11)
RDW: 14.7 % (ref 11.5–15.5)
WBC: 19.4 10*3/uL — ABNORMAL HIGH (ref 4.0–10.5)
nRBC: 0.2 % (ref 0.0–0.2)

## 2019-10-09 LAB — COMPREHENSIVE METABOLIC PANEL
ALT: 62 U/L — ABNORMAL HIGH (ref 0–44)
AST: 63 U/L — ABNORMAL HIGH (ref 15–41)
Albumin: 2.6 g/dL — ABNORMAL LOW (ref 3.5–5.0)
Alkaline Phosphatase: 96 U/L (ref 38–126)
Anion gap: 10 (ref 5–15)
BUN: 44 mg/dL — ABNORMAL HIGH (ref 8–23)
CO2: 29 mmol/L (ref 22–32)
Calcium: 9.1 mg/dL (ref 8.9–10.3)
Chloride: 102 mmol/L (ref 98–111)
Creatinine, Ser: 0.59 mg/dL (ref 0.44–1.00)
GFR calc Af Amer: >60 mL/min (ref >60)
GFR calc non Af Amer: >60 mL/min (ref >60)
Glucose, Bld: 117 mg/dL — ABNORMAL HIGH (ref 70–99)
Potassium: 4.7 mmol/L (ref 3.5–5.1)
Sodium: 141 mmol/L (ref 135–145)
Total Bilirubin: 1.1 mg/dL (ref 0.3–1.2)
Total Protein: 6.9 g/dL (ref 6.5–8.1)

## 2019-10-09 LAB — MRSA PCR SCREENING: MRSA by PCR: POSITIVE — AB

## 2019-10-09 LAB — MAGNESIUM: Magnesium: 2.3 mg/dL (ref 1.7–2.4)

## 2019-10-09 LAB — AMMONIA: Ammonia: 16 umol/L (ref 9–35)

## 2019-10-09 MED ORDER — GLYCOPYRROLATE 0.2 MG/ML IJ SOLN
0.2000 mg | Freq: Once | INTRAMUSCULAR | Status: AC
  Administered 2019-10-09: 0.2 mg via INTRAVENOUS
  Filled 2019-10-09: qty 1

## 2019-10-09 MED ORDER — SODIUM CHLORIDE 0.9 % IV BOLUS
500.0000 mL | Freq: Once | INTRAVENOUS | Status: AC
  Administered 2019-10-09: 500 mL via INTRAVENOUS

## 2019-10-09 MED ORDER — SODIUM CHLORIDE 0.9 % IV SOLN
INTRAVENOUS | Status: DC | PRN
  Administered 2019-10-09: 500 mL via INTRAVENOUS
  Administered 2019-10-15: 100 mL via INTRAVENOUS
  Administered 2019-10-16: 250 mL via INTRAVENOUS

## 2019-10-09 MED ORDER — MUPIROCIN 2 % EX OINT
1.0000 "application " | TOPICAL_OINTMENT | Freq: Two times a day (BID) | CUTANEOUS | Status: AC
  Administered 2019-10-09 – 2019-10-14 (×10): 1 via NASAL
  Filled 2019-10-09: qty 22

## 2019-10-09 MED ORDER — AMIODARONE IV BOLUS ONLY 150 MG/100ML: 150.0000 mg | Freq: Once | INTRAVENOUS | Status: DC

## 2019-10-09 MED ORDER — VANCOMYCIN HCL 1250 MG/250ML IV SOLN
1250.0000 mg | Freq: Once | INTRAVENOUS | Status: AC
  Administered 2019-10-09: 1250 mg via INTRAVENOUS
  Filled 2019-10-09: qty 250

## 2019-10-09 MED ORDER — LABETALOL HCL 5 MG/ML IV SOLN
5.0000 mg | INTRAVENOUS | Status: DC | PRN
  Administered 2019-10-09 – 2019-10-11 (×3): 5 mg via INTRAVENOUS
  Filled 2019-10-09 (×5): qty 4

## 2019-10-09 MED ORDER — ALBUTEROL SULFATE (2.5 MG/3ML) 0.083% IN NEBU
2.5000 mg | INHALATION_SOLUTION | Freq: Four times a day (QID) | RESPIRATORY_TRACT | Status: DC
  Administered 2019-10-09 – 2019-10-11 (×8): 2.5 mg via RESPIRATORY_TRACT
  Filled 2019-10-09 (×10): qty 3

## 2019-10-09 MED ORDER — VANCOMYCIN HCL IN DEXTROSE 1-5 GM/200ML-% IV SOLN
1000.0000 mg | INTRAVENOUS | Status: AC
  Administered 2019-10-10 – 2019-10-15 (×6): 1000 mg via INTRAVENOUS
  Filled 2019-10-09 (×6): qty 200

## 2019-10-09 MED ORDER — SODIUM CHLORIDE 0.9 % IV SOLN
3.0000 g | Freq: Four times a day (QID) | INTRAVENOUS | Status: AC
  Administered 2019-10-09 – 2019-10-15 (×27): 3 g via INTRAVENOUS
  Filled 2019-10-09 (×4): qty 3
  Filled 2019-10-09: qty 8
  Filled 2019-10-09 (×8): qty 3
  Filled 2019-10-09: qty 8
  Filled 2019-10-09: qty 3
  Filled 2019-10-09: qty 8
  Filled 2019-10-09: qty 3
  Filled 2019-10-09: qty 8
  Filled 2019-10-09: qty 3
  Filled 2019-10-09: qty 8
  Filled 2019-10-09: qty 3
  Filled 2019-10-09: qty 8
  Filled 2019-10-09: qty 3
  Filled 2019-10-09: qty 8
  Filled 2019-10-09: qty 3
  Filled 2019-10-09: qty 8
  Filled 2019-10-09: qty 3

## 2019-10-09 MED ORDER — COLLAGENASE 250 UNIT/GM EX OINT
TOPICAL_OINTMENT | Freq: Every day | CUTANEOUS | Status: DC
  Administered 2019-10-14: 1 via TOPICAL
  Filled 2019-10-09 (×4): qty 30

## 2019-10-09 MED ORDER — SODIUM CHLORIDE 3 % IN NEBU
2.0000 mL | INHALATION_SOLUTION | Freq: Two times a day (BID) | RESPIRATORY_TRACT | Status: AC
  Administered 2019-10-09 (×2): 2 mL via RESPIRATORY_TRACT
  Administered 2019-10-10: 4 mL via RESPIRATORY_TRACT
  Administered 2019-10-10 – 2019-10-11 (×3): 2 mL via RESPIRATORY_TRACT
  Filled 2019-10-09 (×6): qty 4

## 2019-10-09 NOTE — Progress Notes (Signed)
PROGRESS NOTE    Barbara Flowers  [id number] DOB: 1951-07-26 DOA: 09/16/2019 PCP: [name], NP    Brief NarrativeBP:[id number] with hx breast cancer, dementia, hx prior CVA presented on 12/26 with extensive L sided infiltrate and effusion with hypoxemia, requiring nonrebreather. Pt was initially admitted to [facility]:   Active Problems:   Dementia ([place])   CAP (community acquired pneumonia)   Pleural effusion on left   Acute prerenal azotemia   Hypernatremia   Pressure injury of skin   Malnutrition of moderate degree   Acute hypoxemic respiratory failure (HCC)   Pleural effusion   Acute systolic heart failure (HCC)   Failure to thrive (0-17)  Acute hypoxemic and hypercarbic respiratory failure in setting of MRSA pneumonia, poor cough mechanics and atelectasis/mucous plugging  -worsening pulm status despite thoracentesis x 2 and requiring intubation for airway protection, transferring service to CCM -Pt ultimately able to be extubated on 1/14. Per PCCM, still at risk for reintubation given poor cough mechanics and general failure to thrive -Will continue chest PT and suctioning as needed per RT -Per Pulmonary, if reintubated would require tracheostomy.  -Overnight, patient noted to have acutely worsened respiratory status, requiring high flow O2 and transfer to SDU. Discussed case with CCM. Appreciate input -CXR reviewed with findings of dense consolidation involving LLL with patchy PNA involving LUL -Pt febrile with WBC up to 19k this AM. Have started empiric vanc and unasyn  Acute metabolic encephalopathy in the setting of critical illness, sepsis/bacteremia superimposed on chronic dementia (w/c bound at baseline), & prior CVA -Ammonia within normal limits, reviewed -LFTs within normal limits -Would cont to avoid sedating meds -Have ordered and reviewed head CT with no acute findings. Advanced generalized brain atrophy and extensive chronic ischemic  changes throughout the brain noted. 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 Paroxysmal atrial fibrillation with RVR -Ejection fraction of 25 to 30% -Prior to intubation, pt noted to have generalized edema and anasarca -On transfer out of unit, clinically appears euvolemic with edema much improved -Appears euvolemic on exam this AM  Intermittent fluid and electrolyte imbalance: hypokalemia Plan -replaced -repeat bmet in AM  Protein calorie malnutrition, moderate, likely secondary to chronic illness -Exhibits significant mild muscle depletion -Had been with NG tube feeding -NG displaced and now removed  Sacral decubitus -Present on admission -WOC  Tachycardia -EKG reviewed. Findings c/w rapid flutter -Will check Mg level -Given 5mg  labetalol without significant improvement -Given 1L bolus -If still no improvement, consider amio bolus  DVT prophylaxis: Heparin subq Code Status: Full Family Communication: Pt in room, family not at bedside Disposition Plan: Uncertain at this time  Consultants:   PCCM  Procedures:  12/28 thora 1/2 thora 1/3 intubated and transferred to ICU 1/4 weaning vent x 6 hours  1/7 weaning OK but copious secretions 1/8 started vanc for MRSA sputum 1/11 not weaning well 1/12-did 30 minutes of pressure support, secretions also slightly improved 1/13 more awake weaning x 2 hrs->spoke w/ family still want full support 1/14 acceptable rapid shallow. Following commands, extubated.  Sliding scale insulin discontinued as she had been euglycemic not requiring insulin  Antimicrobials: Anti-infectives (From admission, onward)   Start     Dose/Rate Route Frequency Ordered Stop   10/10/19 1100  vancomycin (VANCOCIN) IVPB 1000 mg/200 mL premix     1,000 mg 200  mL/hr  over 60 Minutes Intravenous Every 24 hours 10/09/19 0922     10/09/19 1030  vancomycin (VANCOREADY) IVPB 1250 mg/250 mL     1,250 mg 166.7 mL/hr over 90 Minutes Intravenous  Once 10/09/19 0911 10/09/19 1458   10/09/19 1000  Ampicillin-Sulbactam (UNASYN) 3 g in sodium chloride 0.9 % 100 mL IVPB     3 g 200 mL/hr over 30 Minutes Intravenous Every 6 hours 10/09/19 0901     10/02/19 2100  vancomycin (VANCOCIN) IVPB 1000 mg/200 mL premix  Status:  Discontinued     1,000 mg 200 mL/hr over 60 Minutes Intravenous Every 24 hours 10/02/19 1404 10/05/19 0849   09/29/19 1400  vancomycin (VANCOREADY) IVPB 1500 mg/300 mL  Status:  Discontinued     1,500 mg 150 mL/hr over 120 Minutes Intravenous Every 24 hours 09/28/19 1228 10/02/19 1404   09/28/19 1300  vancomycin (VANCOREADY) IVPB 1750 mg/350 mL     1,750 mg 175 mL/hr over 120 Minutes Intravenous  Once 09/28/19 1228 09/28/19 1451   09/17/19 1000  azithromycin (ZITHROMAX) 500 mg in sodium chloride 0.9 % 250 mL IVPB  Status:  Discontinued     500 mg 250 mL/hr over 60 Minutes Intravenous Every 24 hours 09/17/19 0920 09/18/19 1157   09/16/19 2200  cefTRIAXone (ROCEPHIN) 2 g in sodium chloride 0.9 % 100 mL IVPB     2 g 200 mL/hr over 30 Minutes Intravenous Every 24 hours 09/16/19 1745 09/29/19 2216   09/16/19 1745  cefTRIAXone (ROCEPHIN) injection 1 g  Status:  Discontinued     1 g Intramuscular Every 24 hours 09/16/19 1740 09/16/19 1743   09/16/19 1515  ceFEPIme (MAXIPIME) 2 g in sodium chloride 0.9 % 100 mL IVPB     2 g 200 mL/hr over 30 Minutes Intravenous  Once 09/16/19 1513 09/16/19 1834   09/16/19 1515  metroNIDAZOLE (FLAGYL) IVPB 500 mg  Status:  Discontinued     500 mg 100 mL/hr over 60 Minutes Intravenous  Once 09/16/19 1513 09/16/19 1741   09/16/19 1515  vancomycin (VANCOCIN) IVPB 1000 mg/200 mL premix  Status:  Discontinued     1,000 mg 200 mL/hr over 60 Minutes Intravenous  Once 09/16/19 1513 09/16/19 1834      Subjective: Unable to  assess given current mentation  Objective: Vitals:   10/09/19 1400 10/09/19 1500 10/09/19 1600 10/09/19 1700  BP: (!) 129/42 (!) 114/48 (!) 88/40 (!) 100/52  Pulse: (!) 59 61 (!) 135 (!) 135  Resp: (!) 21 20 (!) 23 20  Temp:   99 F (37.2 C)   TempSrc:   Oral   SpO2: 99% 99% 100% 100%  Weight:      Height:        Intake/Output Summary (Last 24 hours) at 10/09/2019 1744 Last data filed at 10/09/2019 1711 Gross per 24 hour  Intake 779.36 ml  Output 750 ml  Net 29.36 ml   Filed Weights   10/07/19 0500 10/08/19 0500 10/09/19 0332  Weight: 58 kg 56.7 kg 56.2 kg    Examination: General exam: encephalopathic, in no acute distress Respiratory system: normal chest rise, coarse sounds Cardiovascular system: regular rhythm, s1-s2 Gastrointestinal system: Nondistended, nontender, pos BS Central nervous system: No seizures, no tremors Extremities: No cyanosis, no joint deformities Skin: No rashes, no pallor Psychiatry: unable to assess given current mentation  Data Reviewed: I have personally reviewed following labs and imaging studies  CBC: Recent Labs  Lab 10/03/19 0312 10/06/19 0609 10/08/19 0416 10/09/19 0137  WBC 13.9* 11.3* 14.0* 19.4*  HGB 8.6* 8.5* 8.3* 9.0*  HCT 28.2* 28.7* 27.4* 30.1*  MCV 96.9 99.7 96.5 97.7  PLT 365 376 390 [id number]*   Basic Metabolic Panel: Recent Labs  Lab 10/05/19 0905 10/06/19 0609 10/07/19 0417 10/08/19 0416 10/09/19 0137  NA 137 137 134* 139 141  K 3.4* 3.5 4.4 4.4 4.7  CL 94* 97* 97* 100 102  CO2 31 29 26 29 29   GLUCOSE 114* 130* 132* 140* 117*  BUN 49* 44* 47* 47* 44*  CREATININE 0.57 0.46 0.57 0.53 0.59  CALCIUM 8.6* 8.5* 8.5* 8.7* 9.1  MG  --  2.1 2.3  --   --    GFR: Estimated Creatinine Clearance: 58.9 mL/min (by C-G formula based on SCr of 0.59 mg/dL). Liver Function Tests: Recent Labs  Lab 10/03/19 0312 10/08/19 0416 10/09/19 0137  AST 39 50* 63*  ALT 35 53* 62*  ALKPHOS 81 89 96  BILITOT 0.7 0.7 1.1  PROT 6.5  6.8 6.9  ALBUMIN 2.5* 2.5* 2.6*   No results for input(s): LIPASE, AMYLASE in the last 168 hours. Recent Labs  Lab 10/09/19 1013  AMMONIA 16   Coagulation Profile: No results for input(s): INR, PROTIME in the last 168 hours. Cardiac Enzymes: No results for input(s): CKTOTAL, CKMB, CKMBINDEX, TROPONINI in the last 168 hours. BNP (last 3 results) No results for input(s): PROBNP in the last 8760 hours. HbA1C: No results for input(s): HGBA1C in the last 72 hours. CBG: Recent Labs  Lab 10/05/19 2051 10/05/19 2331 10/06/19 0323 10/06/19 0929 10/07/19 0342  GLUCAP 108* 112* 121* 138* 142*   Lipid Profile: No results for input(s): CHOL, HDL, LDLCALC, TRIG, CHOLHDL, LDLDIRECT in the last 72 hours. Thyroid Function Tests: No results for input(s): TSH, T4TOTAL, FREET4, T3FREE, THYROIDAB in the last 72 hours. Anemia Panel: No results for input(s): VITAMINB12, FOLATE, FERRITIN, TIBC, IRON, RETICCTPCT in the last 72 hours. Sepsis Labs: No results for input(s): PROCALCITON, LATICACIDVEN in the last 168 hours.  Recent Results (from the past 240 hour(s))  MRSA PCR Screening     Status: Abnormal   Collection Time: 10/09/19  9:21 AM   Specimen: Nasal Mucosa; Nasopharyngeal  Result Value Ref Range Status   MRSA by PCR POSITIVE (A) NEGATIVE Final    Comment:        The GeneXpert MRSA Assay (FDA approved for NASAL specimens only), is one component of a comprehensive MRSA colonization surveillance program. It is not intended to diagnose MRSA infection nor to guide or monitor treatment for MRSA infections. RESULT CALLED TO, READ BACK BY AND VERIFIED WITH: ROMINES,H. RN @1531  10/09/19 BILLINGSLEY,L Performed at [facility], [place] [address], [place],  [address]      Radiology Studies: DG CHEST PORT 1 VIEW  Result Date: 10/09/2019 CLINICAL DATA:  Follow-up atelectasis involving the LEFT lung related to mucous plugging. EXAM: PORTABLE CHEST 1 VIEW 12:20 p.m.:  COMPARISON:  Portable chest x-ray earlier same day 12:35 a.m. and previously. FINDINGS: Since the examination earlier same day, the nasogastric tube has withdrawn such that its tip is now in the distal esophagus. The tube should be readvanced. Interval improved aeration in the LEFT lung since earlier today, though consolidation persists in the LEFT LOWER LOBE. There are patchy airspace opacities in the LEFT UPPER LOBE. RIGHT lung remains clear. Dense calcifications in the LEFT breast are likely benign and dystrophic in origin. IMPRESSION: 1. The nasogastric tube has withdrawn such that its tip is now in  the distal esophagus. The tube should be readvanced. 2. Improved aeration in the LEFT lung since earlier today, though dense atelectasis and/or pneumonia persists in the LEFT LOWER LOBE and there is patchy pneumonia involving the LEFT UPPER LOBE. These results will be called to the ordering clinician or representative by the Radiologist Assistant, and communication documented in the PACS or zVision Dashboard. Electronically Signed   By: Evangeline Dakin M.D.   On: 10/09/2019 12:51   DG CHEST PORT 1 VIEW  Result Date: 10/09/2019 CLINICAL DATA:  Increased oxygen demand. EXAM: PORTABLE CHEST 1 VIEW COMPARISON:  Radiograph 10/07/2019, CT 09/22/2019 FINDINGS: Significant increased volume loss the left hemithorax with increased opacity in the perihilar region and left lung base. There is leftward mediastinal shift, likely accentuated by patient rotation. Right lung is clear. No visualized pneumothorax. Surgical clips in the left axilla. Calcifications project over the left breast. Diffuse bony under mineralization. Chronic change about the left shoulder. Enteric tube in place with tip below the diaphragm. IMPRESSION: Increased volume loss in the left hemithorax over the past 2 days with increased opacity in the perihilar region and left lung base. Favor mucous plugging or aspiration given volume loss, re-accumulation  of left pleural fluid is also considered but felt less likely. Electronically Signed   By: Keith Rake M.D.   On: 10/09/2019 01:10    Scheduled Meds: . albuterol  2.5 mg Nebulization QID  . chlorhexidine  15 mL Mouth Rinse BID  . Chlorhexidine Gluconate Cloth  6 each Topical Daily  . collagenase   Topical Daily  . donepezil  10 mg Per Tube QHS  . feeding supplement (PRO-STAT SUGAR FREE 64)  30 mL Per Tube Daily  . furosemide  40 mg Intravenous Daily  . Gerhardt's butt cream   Topical BID  . heparin  5,000 Units Subcutaneous Q8H  . mouth rinse  15 mL Mouth Rinse q12n4p  . memantine  10 mg Per Tube BID  . mupirocin ointment  1 application Nasal BID  . sennosides  5 mL Per Tube BID  . sodium chloride HYPERTONIC  2 mL Nebulization BID   Continuous Infusions: . sodium chloride 10 mL/hr at 10/09/19 1711  . ampicillin-sulbactam (UNASYN) IV Stopped (10/09/19 1630)  . feeding supplement (OSMOLITE 1.2 CAL) Stopped (10/09/19 0200)  . [START ON 10/10/2019] vancomycin       LOS: 23 days   Marylu Lund, MD [facility] Pager On Amion  If 7PM-7AM, please contact night-coverage 10/09/2019, 5:44 PM

## 2019-10-09 NOTE — Progress Notes (Signed)
Pt sats in 80's, NTS X 3 for moderate amount of thick cream colored secretions. Placed on 12L [place] nasal cannula, ABG drawn and sent to lab.  Gave report to ICU RT.

## 2019-10-09 NOTE — Progress Notes (Signed)
Pt. Brought to ICU from floor for desaturations.  Pt. Placed on HFNC 15 lpm with PRB, 15 liters.  Patient suctioned via right nare for copious amounts of yellow secretions.  Saturations currently 99%.

## 2019-10-09 NOTE — Consult Note (Signed)
[facility] Nurse Consult Note: Reason for Consult: [place] team reconsulted for sacrum wound.  Initial consult was performed on 12/29; pt had an unstageable pressure injury which was present on admission and Santyl was ordered to provide enzymatic debridement at that time.   Wound type: Unstageable pressure injury has increased in size; 9X7X1cm; 100% loose slough which has lifted at the edge and evolved into a stage 4 pressure injury underneath, since bone is palpable with a swab when probed. Mod amt tan drainage and strong foul odor.  Pressure Injury POA: Yes Dressing procedure/placement/frequency: Pt has been on a low airloss mattress to decrease pressure. Continue present plan of care; topical treatment orders provided for bedside nurses to perform daily as follows: Apply Santyl and moist gauze packing to sacrum wound Q day, then cover with foam dressing.  (Change foam dressing Q 3 days or PRN soiling.) Secure chat message sent to primary team to request a surgical consult for bedside debridement of loose nonviable tissue.  Please re-consult if further assistance is needed.  Thank-you,  Julien Girt MSN, [place], [place], [place], [place]

## 2019-10-09 NOTE — Progress Notes (Addendum)
Pharmacy Antibiotic Note  Barbara Flowers is a 69 y.o. female admitted on 09/16/2019 with pneumonia.  Pharmacy has been consulted for Unasyn and vancomycin dosing. To start Unasyn & vancomycin for aspiration PNA. WBC up to 19.4, TM 101  Plan: Unasyn 3 gm IV q6h Vancomycin 1250 mg IV loading dose vancomcyin 1 gm IV q24 for est AUC 468 from levels drawn 1/10-1/11 F/u renal fxn, WBC, temp, MRSA PCR Vancomycin levels as needed  Height: 5\' 8"  (172.7 cm) Weight: 123 lb 14.4 oz (56.2 kg) IBW/kg (Calculated) : 63.9  Temp (24hrs), Avg:99.7 F (37.6 C), Min:98.9 F (37.2 C), Max:101 F (38.3 C)  Recent Labs  Lab 10/02/19 1307 10/03/19 0312 10/05/19 0205 10/05/19 0905 10/06/19 0609 10/07/19 0417 10/08/19 0416 10/09/19 0137  WBC  --  13.9*  --   --  11.3*  --  14.0* 19.4*  CREATININE  --  0.58   < > 0.57 0.46 0.57 0.53 0.59  VANCOTROUGH 26*  --   --   --   --   --   --   --    < > = values in this interval not displayed.    Estimated Creatinine Clearance: 58.9 mL/min (by C-G formula based on SCr of 0.59 mg/dL).    Allergies  Allergen Reactions  . Gabapentin Other (See Comments)    seizure  . Tramadol Other (See Comments)    Brings on Seizures    Antimicrobials this admission:  12/26 CTX >> 1/8 12/27 Azith >> 12/28 1/7 vanc >>1/14  1/18>> 1/18 Unasyn>> Dose adjustments this admission:  1/10 Vpk at 1645 = 53 (vanc 1500 mg dose given on 1/10 at 1320) 1/11 VT at 1307= 26 (AUC 936, ke= 0.0350, t1/2= 19.8, Vd= 45.8) - spoke to RN, 1500mg  dose was not given. changed dose to 1000 mg 24h for AUC 468, Cmax 58 and Cmin 13 Microbiology results:  12/26 BCx: 1/4 bottles with MR-CoNS AND proteus (pan-sensitive) 12/28 Pleural Fluid:  ngF 12/27 MRSA PCR: negative 1/2 Pleural fluid: neg FINAL 1/6 resp culture: few staph - MRSA FINAL  Thank you for allowing pharmacy to be a part of this patient's care.  Eudelia Bunch, Pharm.D [phone] 10/09/2019 9:08 AM

## 2019-10-09 NOTE — Progress Notes (Signed)
MD Verbal Order to hold tube feeds.

## 2019-10-09 NOTE — Significant Event (Signed)
Rapid Response Event Note  Overview:  Patient de Sat's into the Low 80's, with increased work of breathing. Patient had been on 4L via [place] to 15L Hi[place]lo [place] with O2 sats in the 70's.Pt is tachypneic with accessory muscle use RT at bedside performed NT suctioning and removed a moderate amount of tan secretions. Pt only responsive to painful stimuli. NP C. Bodenheimer at bedside, CXR and ABG completed will transfer patient to [facility].    [facility] Brittley Regner MSN, RN-BC  [place]

## 2019-10-09 NOTE — Progress Notes (Signed)
While RN and NT were in room turning patient and assessing sacral wound saturations dropped. Patient was repositioned but after being turned, patient's saturations were in the low 80's and not coming back up. Patient was initially on 2L and was increased to 4l without improvement in saturation. CN, Rapid response, and respiratory were called to bedside to assist. Respiratory was able to perform nasal tracheal suctioning at bedside. Patient was also transitioned to high flow nasal cannula. Rapid RN arrived and NP on call came to bedside.

## 2019-10-09 NOTE — Progress Notes (Signed)
Attempted small bore feeding tube placement. Patient combative and screaming. Unable to safely place tube. Per CO2 detector tube placed in lung X3. Tube removed. Patient O2 saturations stable. MD notified of unsuccessful attempts.

## 2019-10-09 NOTE — Progress Notes (Signed)
Palliative Care Brief Note  Palliative care consult received.  Case discussed with Dr. Valeta Harms and Eliseo Gum of PCCM.  Ms. Bott is not able to participate in goals of care conversation due to her mental status.  Chart reviewed and attempted to call patient's husband.  No answer and left voicemail with contact information.  I am going off service today, however I will ask another member of the palliative medicine team to follow-up with Ms. Raju and her family.  Micheline Rough, MD [place] Team [phone]  No charge note

## 2019-10-09 NOTE — Progress Notes (Signed)
Bipap is contraindicated in this patient because of her inability to meaningfully remove mask.  RN made aware.

## 2019-10-09 NOTE — Progress Notes (Signed)
Post nebulizer treatment - placed CPT vest around PT (therapy had  NOT been initiated) and PT started to experience HR >150. RN aware and planning to complete EKG. CPT being held at this time. Also, when placing vest on PT-  PT responds with grimace and loud moans / grunts.

## 2019-10-09 NOTE — Progress Notes (Addendum)
NAME:  Barbara Flowers, MRN:  [id number], DOB:  1951/08/26, LOS: 23 ADMISSION DATE:  09/16/2019, CONSULTATION DATE:  12/26  REFERRING MD:  Earlie Counts, CHIEF COMPLAINT: Shortness of breath, altered mental status  Brief History   69 yowf never smoker with h/o breast ca s/p bone marrow transplant early 1990s in [place] and cancer free s recent rx but problems with sundowning/ dementia and w/c bound x 3 years p cva acutely worse x one week PTA with cough/ congestion rx over the phone by PCP since 12/21 and gradually worse x 2 days PTA with poor po intake, ams and weakness with mostly mucoid sputum > to ER pm 12/26 with extensive L infiltrate and L effusion with hypoxemia corrected on non rebreathing and PCCM service asked to admit. Signed off to the hospitalist service Was asked to see her again for altered mental status, hypoxemia, shortness of breath Post thoracentesis for about 820 cc of fluid Has MRSA pneumonia  Past Medical History   Past Medical History:  Diagnosis Date  . Anemia   . Anemia   . Anxiety disorder   . Blood transfusion without reported diagnosis   . Brain cancer [facility])    s/p sterotactic radio surgery  . Brain tumor ([place])   . Breast cancer ([place])   . Cancer ([place]) 1990   L breast  . H/O bone marrow transplant ([place])   . Hypothyroidism   . IBS (irritable bowel syndrome)   . MDD (major depressive disorder)   . Memory loss   . Obesity   . Orthostatic hypotension   . Seizure ([place])   . Seizures ([place])   . Stroke [facility]) 2005   hemorrhagic  . Stroke ([place])   . Subdural hematoma ([place])   . Upper brachial plexus paralysis syndrome   . Urinary incontinence    Significant Hospital Events   12/28 thora 1/2 thora 1/3 intubated and transferred to ICU 1/4 weaning vent x 6 hours  1/7 weaning OK but copious secretions 1/8 started vanc for MRSA sputum 1/11 not weaning well 1/12-did 30 minutes of pressure support, secretions also slightly improved 1/13 more awake weaning x 2  hrs->spoke w/ family still want full support 1/14 acceptable rapid shallow. Following commands, extubated.  Sliding scale insulin discontinued as she had been euglycemic not requiring insulin 1/15: looks better ready for floor. PCCM sign off.  1/18 Rapid response overnight, suspected ongoing aspiration copious thick secretions, poor mentation. PCCM re-engaged   Consults:  PCCM  Procedures:  1/3 ETT>>>1/14  Significant Diagnostic Tests:  Blood culture 12/28 bedside thoracentesis-200 cc 1/2 thoracentesis-820 cc of fluid drained Chest x-ray 09/24/2019-bilateral pleural effusions, possible left lower lobe infiltrate -ECHO 1/3 LVEF 25-30%, severely decreased LV systolic function. LV grade II diastolic dysfunction. RV severely reduced systolic function. Trivial pericardial effusion , L pleural effusion is seen 1/6 CXR> dense left lower lobe opacity silhouetting the left hemidiaphragm 1/6 upper extremity ultrasounds-no DVTs 1/16 CT H> no acute intracranial changes. Advanced generalized cerebral atrophy. Chronic small vessel ischemic changes to pons, Old L cerebellar infarct. Old L thalamus infarct. Prior R parietal craniotomy with underlying encephalomalacia, parenchymal calcification. 1/18 CXR> L sided volume loss with L hemithorax, increased L basilar opacity from prior. Clear R lung.  Micro Data:  Covid 19 PCR 12/26 >> neg RVP 12/26 >> neg  Procalcitonin 12/26 >> 2.01, down trended to 0.4 with abx BCx2 12/26 >> Pan sensitive Proteus Mirabilis UC 12/26 >> negative Urine Pneum Ag 12/26 >> positive Respiratory culture 1/6>> MRSA  Antimicrobials:  Rocephin 12/26 >> (14 day course) 1/8 Azithromycin 12/27-28 Vancomycin 12/26, 1/7-1/12, 1/18> Unasyn 1/18>   Interim history/subjective:  Very weak Copious thick secretions in anterior oropharynx  Poor mentation O2 support weaned to 12L [place]  Febrile to 101, WBC 19.4 Objective   Blood pressure (!) 126/48, pulse 78, temperature 98.9 F (37.2  C), temperature source Axillary, resp. rate (!) 25, height 5\' 8"  (1.727 m), weight 56.2 kg, SpO2 99 %. on Room Air        Intake/Output Summary (Last 24 hours) at 10/09/2019 0908 Last data filed at 10/09/2019 0500 Gross per 24 hour  Intake 540 ml  Output 925 ml  Net -385 ml   Filed Weights   10/07/19 0500 10/08/19 0500 10/09/19 0332  Weight: 58 kg 56.7 kg 56.2 kg   Scheduled Meds: . chlorhexidine  15 mL Mouth Rinse BID  . Chlorhexidine Gluconate Cloth  6 each Topical Daily  . collagenase   Topical Daily  . donepezil  10 mg Per Tube QHS  . feeding supplement (PRO-STAT SUGAR FREE 64)  30 mL Per Tube Daily  . furosemide  40 mg Intravenous Daily  . Gerhardt's butt cream   Topical BID  . heparin  5,000 Units Subcutaneous Q8H  . mouth rinse  15 mL Mouth Rinse q12n4p  . memantine  10 mg Per Tube BID  . sennosides  5 mL Per Tube BID   Continuous Infusions: . ampicillin-sulbactam (UNASYN) IV    . feeding supplement (OSMOLITE 1.2 CAL) Stopped (10/09/19 0200)   PRN Meds:.acetaminophen (TYLENOL) oral liquid 160 mg/5 mL, albuterol, docusate, sodium chloride flush   Examination:  General: Frail and debilitated appearing 69 older adult F. Reclined in bed with poor mentation, NAD  HEENT: Temporal wasting. Pink tacky mucus membranes. Copious congealed tan secretions in oropharynx. NGT in place. Chronic C spine contraction CV: RRR s1s2 no rgm. 1+ peripheral pulses Pulm: Very diminished L sided sounds. Scattered rhonchi on R side No accessory muscle use.  GI: flat, thin, soft, ndnt GU: defer Extremities: Chronic LUE contracture. Muscle wasting bilaterally.  Neuro: Grimaces and groans to painful stimuli. Is not following commands.  Skin: pale, clean, dry, cool  Resolved Hospital Problem list   MRSA PNA completed Rx 1/14 Proteus mirabilis bacteremia completed rx 1/8 Contraction alkalosis  Acute hypoxemic and hypercarbic respiratory failure  Assessment & Plan:   Acute respiratory failure  with hypoxemia -in setting of MRSA pneumonia, poor cough mechanics and atelectasis/mucous plugging. Recurring pleural effusions s/p thora. -Underlying debility, failure to thrive  -intubated 1/3-1/14. Poor cough mechanics at time of extubation, hight risk for reintubation  Plan -Aspiration precautions  -NT suction, CPT -SpO2 goal > 90%, titrate supplemental O2 for goal -ABG now -Monitor for airway protection-- I do not think that reintubation (with almost certain need for tracheostomy) is in the best interest of this patient as I worry it will not resolve the underlying problems of failure to thrive and debility -Palliative consult   Acute metabolic encephalopathy in the setting of critical illness, hypoxia, recent and suspected ongoing sepsis, superimposed on chronic dementia (w/c bound at baseline), & prior CVA  -CT H 1/16 without acute intracranial changes, but with generalized brain atrophy.  -1/18 0000 ABG suggestive of hypoxemia, with PaO2 56 on 12L. 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 Paroxysmal atrial fibrillation with RVR -Ejection fraction of 25 to 30% Plan -ICU monitoring  -Daily consideration for diuresis (none 1/18)  Transaminitis, mild -likely elevated either in setting of intermittent hypotension or possible hepatic congestion due heart failure Plan -trend CMP  Protein calorie malnutrition, moderate, likely secondary to chronic illness -global muscle depletion   Plan -EN per NGT -if current or more aggressive care is desired, may warrant discussion for possible PEG as mentation has not improved   Sacral decubitus -Present on admission Plan -Continue wound care, WOC has requested surgical consult for bedside debridement of non-viable tissue   Goals of Care: -I worry that the patient's acute on chronic illness course may be insurmountable  -I anticipate we will continue to see progressive respiratory failure due to underlying failure to thrive and debilitation, unfortunately yielding very weak cough mechanics and likely ongoing aspiration with subsequent mucus plugging -I worry that reintubation with subsequent tracheostomy may not help fix the underlying causes of the patient's recurring decline and thus is may not be in the best interest of the patient. Palliative care will be placed 1/18 and I will approach the topic of goals of care with husband 1/18 upon his arrival.  -Long conversation with Father, son (neurosurgery resident/fellow at [facility]) on phone who favor aggressive intervention at this time. Discussed further with Dr. Valeta Harms who has advised that invasive ventilation will be unlikely to yield long term benefit   Best practice:  Diet: EN  Pain/Anxiety/Delirium protocol (if indicated):  na Glycemic control: monitor VAP protocol (if indicated): na DVT prophylaxis: Subcu heparin Mobility: Bedrest Code Status: Full code Family Communication: Briefly spoke with husband who asked to delay further conversation until arrival to bedside this morning 1/18 Disposition: ICU   CRITICAL CARE Performed by: Cristal Generous   Total critical care time: 45 minutes  Critical care time was exclusive of separately billable procedures and treating other patients. Critical care was necessary to treat or prevent imminent or life-threatening deterioration.  Critical care was time spent personally by me on the following activities: development of treatment  plan with patient and/or surrogate as well as nursing, discussions with consultants, evaluation of patient's response to treatment, examination of patient, obtaining history from patient or surrogate, ordering and performing treatments and interventions, ordering and review of laboratory studies, ordering and review of radiographic studies, pulse oximetry and re-evaluation of patient's condition.  Eliseo Gum MSN, AGACNP-BC [place] [id number] If no answer, [phone] 10/09/2019, 11:30 AM   PCCM attending:  This is a 69 year old history of breast cancer status post bone marrow transplant and 1990s, history of brain tumor with stereotactic radiosurgery, history of strokes, unfortunately has developed declining dementia and memory loss.  At this point pulmonary was reconsulted to see the patient due to decline which resulted in transfer to the intensive care unit last night.  Chest x-ray revealing left-sided mucous plugging and mediastinal shift.  Also hypoxic secondary to recurrent aspiration pneumonia.  We spent several minutes at bedside on FaceTime telephone call with patient's son who is a neurosurgical resident in [place] as well as patient's husband of 42 years.  Unfortunately the patient has had continued decline which I believe is related to her history of dementia.  In addition to this she has chronic systolic heart failure with an ejection fraction of 25 to 30%.  BP (!) 119/47   Pulse 62   Temp 98.1 F (36.7 C) (Axillary)   Resp (!) 21   Ht 5\' 8"  (1.727 m)   Wt 56.2 kg   SpO2 94%   BMI 18.84 kg/m   General: Elderly, debilitated, cachectic, muscle wasting HEENT: She will open eyes spontaneously and track Neck: Trachea midline no significant deviation Chest: Diminished breath sounds on the left, some anterior rhonchi, no crackles Heart: Regular rate rhythm, S1-S2 Abdomen: Soft nontender nondistended Extremities: No significant edema  Chest  x-ray: Left-sided mucous plugging, mild mediastinal shift. The patient's images have been independently reviewed by me.    Labs: Reviewed  Assessment: This is a chronically debilitated 69 year old female with advanced dementia now presenting with acute on chronic hypoxemic respiratory failure with left-sided mucous plugging.  This is going to be a recurrent problem and the fact that she is so physically debilitated that she is unlikely to be able to maintain her airway effectively.  She has the inability to mobilize secretions.  During this hospitalization alone has been treated for MRSA pneumonia.  Unfortunately continuing to have decline. In addition to this problem she has chronic systolic heart failure Her acute metabolic encephalopathy is likely related to exacerbation of her dementia state.  Also in the setting of critical illness. Protein calorie malnutrition  Plan: I believe the first step is to align with the patient's goals of care and her current medical comorbidities.  She has no acute indication for endotracheal intubation or need for bronchoscopic intervention due to mucous plugging. We will start with all noninvasive techniques and effort to clear the patient's airway. We will use hypertonic saline nebs followed by albuterol and vest percussive therapy.  Additionally we can use NT suctioning if necessary to help mobilize secretions. We had a long of palliative care discussion about how aggressive measures are likely not in the patient's best interest due to her overall clinical picture. Family has agreed to speak with our palliative care colleagues.  We appreciate their input.  Care discussed with Dr. Micheline Rough from palliative care as well as Dr. Marylu Lund the patient's primary hospitalist.  Garner Nash, DO [facility] 10/09/2019 1:13 PM

## 2019-10-10 ENCOUNTER — Inpatient Hospital Stay (HOSPITAL_COMMUNITY): Payer: Medicare Other

## 2019-10-10 ENCOUNTER — Encounter (HOSPITAL_COMMUNITY): Payer: Self-pay | Admitting: Internal Medicine

## 2019-10-10 DIAGNOSIS — R531 Weakness: Secondary | ICD-10-CM

## 2019-10-10 LAB — CBC
HCT: 28.7 % — ABNORMAL LOW (ref 36.0–46.0)
Hemoglobin: 8.4 g/dL — ABNORMAL LOW (ref 12.0–15.0)
MCH: 28.9 pg (ref 26.0–34.0)
MCHC: 29.3 g/dL — ABNORMAL LOW (ref 30.0–36.0)
MCV: 98.6 fL (ref 80.0–100.0)
Platelets: 383 10*3/uL (ref 150–400)
RBC: 2.91 MIL/uL — ABNORMAL LOW (ref 3.87–5.11)
RDW: 14.4 % (ref 11.5–15.5)
WBC: 17 10*3/uL — ABNORMAL HIGH (ref 4.0–10.5)
nRBC: 0 % (ref 0.0–0.2)

## 2019-10-10 LAB — GLUCOSE, CAPILLARY: Glucose-Capillary: 75 mg/dL (ref 70–99)

## 2019-10-10 LAB — COMPREHENSIVE METABOLIC PANEL
ALT: 48 U/L — ABNORMAL HIGH (ref 0–44)
AST: 37 U/L (ref 15–41)
Albumin: 2.5 g/dL — ABNORMAL LOW (ref 3.5–5.0)
Alkaline Phosphatase: 98 U/L (ref 38–126)
Anion gap: 11 (ref 5–15)
BUN: 49 mg/dL — ABNORMAL HIGH (ref 8–23)
CO2: 26 mmol/L (ref 22–32)
Calcium: 8.5 mg/dL — ABNORMAL LOW (ref 8.9–10.3)
Chloride: 107 mmol/L (ref 98–111)
Creatinine, Ser: 0.65 mg/dL (ref 0.44–1.00)
GFR calc Af Amer: >60 mL/min (ref >60)
GFR calc non Af Amer: >60 mL/min (ref >60)
Glucose, Bld: 96 mg/dL (ref 70–99)
Potassium: 3.7 mmol/L (ref 3.5–5.1)
Sodium: 144 mmol/L (ref 135–145)
Total Bilirubin: 0.8 mg/dL (ref 0.3–1.2)
Total Protein: 6.6 g/dL (ref 6.5–8.1)

## 2019-10-10 MED ORDER — LACTATED RINGERS IV SOLN: INTRAVENOUS | Status: DC

## 2019-10-10 NOTE — Progress Notes (Signed)
RT and RT discussed CPT at this time. Was decided that PT is resting comfortably and Sp02 100% on 2 lpm . CPT held at this time.

## 2019-10-10 NOTE — Progress Notes (Signed)
At 10:00 attempted to insert NGT 3x but unsuccessful. Chiu MD notified and ordered LR IV at 21ml/hr. Patient's became tachycardic at 180's, Afib, MD notified, given PRN Labetalol IV 5mg . Now pt is back to NSR.

## 2019-10-10 NOTE — Progress Notes (Signed)
PROGRESS NOTE    Barbara Flowers  [id number] DOB: Apr 03, 1951 DOA: 09/16/2019 PCP: [name], NP    Brief NarrativeBP:[id number] with hx breast cancer, dementia, hx prior CVA presented on 12/26 with extensive L sided infiltrate and effusion with hypoxemia, requiring nonrebreather. Pt was initially admitted to [place]:   Active Problems:   Dementia ([place])   CAP (community acquired pneumonia)   Pleural effusion on left   Acute prerenal azotemia   Hypernatremia   Pressure injury of skin   Malnutrition of moderate degree   Acute hypoxemic respiratory failure (HCC)   Pleural effusion   Acute systolic heart failure (HCC)   Failure to thrive (0-17)  Acute hypoxemic and hypercarbic respiratory failure in setting of MRSA pneumonia, poor cough mechanics and atelectasis/mucous plugging  -worsening pulm status despite thoracentesis x 2 and requiring intubation for airway protection, transferring service to CCM -Pt ultimately able to be extubated on 1/14. Per PCCM, still at risk for reintubation given poor cough mechanics and general failure to thrive -Will continue chest PT and suctioning as needed per RT -Per Pulmonary, if reintubated would require tracheostomy.  -Overnight, patient noted to have acutely worsened respiratory status, requiring high flow O2 and transfer to SDU. Discussed case with CCM. Appreciate input -CXR reviewed with findings of dense consolidation involving LLL with patchy PNA involving LUL -Pt febrile with WBC up to 19k this on 1/18. Have started empiric vanc and unasyn -WBC improved to 17k this AM. Mentation seems improved  Acute metabolic encephalopathy in the setting of critical illness, sepsis/bacteremia superimposed on chronic dementia (w/c bound at baseline), & prior CVA -Ammonia within normal limits, reviewed -LFTs within normal limits -Would cont to avoid sedating meds -Have ordered and reviewed head CT with no acute findings.  Advanced generalized brain atrophy and extensive chronic ischemic changes throughout the brain noted. 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 Recommendation for surgical consult for bedside debridement -Appreciate assistance by General Surgery. Wound debrided on 1/19  Recent Tachycardia -EKG reviewed. Findings c/w rapid flutter -Mg level 2.3 -HR now much improved. -Continue PRN labetalol for HR>120  DVT prophylaxis: Heparin subq Code Status: Full Family Communication: Pt in room, family not at bedside Disposition Plan: Uncertain at this time  Consultants:   PCCM  Palliative Care  Procedures:  12/28 thora 1/2 thora 1/3 intubated and transferred to ICU 1/4 weaning vent x 6 hours  1/7 weaning OK but copious secretions 1/8 started vanc for MRSA sputum 1/11 not weaning well 1/12-did 30 minutes of pressure support, secretions also slightly improved 1/13 more awake weaning x 2 hrs->spoke w/ family still want full support 1/14 acceptable rapid shallow. Following commands, extubated.  Sliding scale insulin discontinued as  she had been euglycemic not  requiring insulin  Antimicrobials: Anti-infectives (From admission, onward)   Start     Dose/Rate Route Frequency Ordered Stop   10/10/19 1100  vancomycin (VANCOCIN) IVPB 1000 mg/200 mL premix     1,000 mg 200 mL/hr over 60 Minutes Intravenous Every 24 hours 10/09/19 0922     10/09/19 1030  vancomycin (VANCOREADY) IVPB 1250 mg/250 mL     1,250 mg 166.7 mL/hr over 90 Minutes Intravenous  Once 10/09/19 0911 10/09/19 1458   10/09/19 1000  Ampicillin-Sulbactam (UNASYN) 3 g in sodium chloride 0.9 % 100 mL IVPB     3 g 200 mL/hr over 30 Minutes Intravenous Every 6 hours 10/09/19 0901     10/02/19 2100  vancomycin (VANCOCIN) IVPB 1000 mg/200 mL premix  Status:  Discontinued     1,000 mg 200 mL/hr over 60 Minutes Intravenous Every 24 hours 10/02/19 1404 10/05/19 0849   09/29/19 1400  vancomycin (VANCOREADY) IVPB 1500 mg/300 mL  Status:  Discontinued     1,500 mg 150 mL/hr over 120 Minutes Intravenous Every 24 hours 09/28/19 1228 10/02/19 1404   09/28/19 1300  vancomycin (VANCOREADY) IVPB 1750 mg/350 mL     1,750 mg 175 mL/hr over 120 Minutes Intravenous  Once 09/28/19 1228 09/28/19 1451   09/17/19 1000  azithromycin (ZITHROMAX) 500 mg in sodium chloride 0.9 % 250 mL IVPB  Status:  Discontinued     500 mg 250 mL/hr over 60 Minutes Intravenous Every 24 hours 09/17/19 0920 09/18/19 1157   09/16/19 2200  cefTRIAXone (ROCEPHIN) 2 g in sodium chloride 0.9 % 100 mL IVPB     2 g 200 mL/hr over 30 Minutes Intravenous Every 24 hours 09/16/19 1745 09/29/19 2216   09/16/19 1745  cefTRIAXone (ROCEPHIN) injection 1 g  Status:  Discontinued     1 g Intramuscular Every 24 hours 09/16/19 1740 09/16/19 1743   09/16/19 1515  ceFEPIme (MAXIPIME) 2 g in sodium chloride 0.9 % 100 mL IVPB     2 g 200 mL/hr over 30 Minutes Intravenous  Once 09/16/19 1513 09/16/19 1834   09/16/19 1515  metroNIDAZOLE (FLAGYL) IVPB 500 mg  Status:  Discontinued     500 mg 100 mL/hr over 60 Minutes  Intravenous  Once 09/16/19 1513 09/16/19 1741   09/16/19 1515  vancomycin (VANCOCIN) IVPB 1000 mg/200 mL premix  Status:  Discontinued     1,000 mg 200 mL/hr over 60 Minutes Intravenous  Once 09/16/19 1513 09/16/19 1834      Subjective: Difficult to assess given current mentation. Pt seems more alert this AM  Objective: Vitals:   10/10/19 1400 10/10/19 1500 10/10/19 1600 10/10/19 1700  BP: (!) 110/45 (!) 103/40 (!) 108/35 (!) 118/39  Pulse: 63 62 (!) 58 (!) 57  Resp: (!) 7 20 17 12   Temp:   97.6 F (36.4 C)   TempSrc:   Axillary   SpO2: 100% 98% 100% 100%  Weight:      Height:        Intake/Output Summary (Last 24 hours) at 10/10/2019 1724 Last data filed at 10/10/2019 1700 Gross per 24 hour  Intake 1180.41 ml  Output 1250 ml  Net -69.59 ml   Filed Weights   10/08/19 0500 10/09/19 0332 10/10/19 0500  Weight: 56.7 kg 56.2 kg 57 kg    Examination: General exam: Awake and more alert today, in no acute distress Respiratory system: normal chest rise, coarse breath sounds Cardiovascular system: regular rhythm, s1-s2 Gastrointestinal system: Nondistended, nontender, pos BS Central nervous system: No  seizures, no tremors Extremities: No cyanosis, no joint deformities Skin: No rashes, no pallor Psychiatry: difficult to assess given current mentation  Data Reviewed: I have personally reviewed following labs and imaging studies  CBC: Recent Labs  Lab 10/06/19 0609 10/08/19 0416 10/09/19 0137 10/10/19 0500  WBC 11.3* 14.0* 19.4* 17.0*  HGB 8.5* 8.3* 9.0* 8.4*  HCT 28.7* 27.4* 30.1* 28.7*  MCV 99.7 96.5 97.7 98.6  PLT 376 390 443* [id number]   Basic Metabolic Panel: Recent Labs  Lab 10/06/19 0609 10/07/19 0417 10/08/19 0416 10/09/19 0137 10/09/19 1900 10/10/19 0500  NA 137 134* 139 141  --  144  K 3.5 4.4 4.4 4.7  --  3.7  CL 97* 97* 100 102  --  107  CO2 29 26 29 29   --  26  GLUCOSE 130* 132* 140* 117*  --  96  BUN 44* 47* 47* 44*  --  49*  CREATININE 0.46 0.57  0.53 0.59  --  0.65  CALCIUM 8.5* 8.5* 8.7* 9.1  --  8.5*  MG 2.1 2.3  --   --  2.3  --    GFR: Estimated Creatinine Clearance: 59.7 mL/min (by C-G formula based on SCr of 0.65 mg/dL). Liver Function Tests: Recent Labs  Lab 10/08/19 0416 10/09/19 0137 10/10/19 0500  AST 50* 63* 37  ALT 53* 62* 48*  ALKPHOS 89 96 98  BILITOT 0.7 1.1 0.8  PROT 6.8 6.9 6.6  ALBUMIN 2.5* 2.6* 2.5*   No results for input(s): LIPASE, AMYLASE in the last 168 hours. Recent Labs  Lab 10/09/19 1013  AMMONIA 16   Coagulation Profile: No results for input(s): INR, PROTIME in the last 168 hours. Cardiac Enzymes: No results for input(s): CKTOTAL, CKMB, CKMBINDEX, TROPONINI in the last 168 hours. BNP (last 3 results) No results for input(s): PROBNP in the last 8760 hours. HbA1C: No results for input(s): HGBA1C in the last 72 hours. CBG: Recent Labs  Lab 10/05/19 2331 10/06/19 0323 10/06/19 0929 10/07/19 0342 10/10/19 1545  GLUCAP 112* 121* 138* 142* 75   Lipid Profile: No results for input(s): CHOL, HDL, LDLCALC, TRIG, CHOLHDL, LDLDIRECT in the last 72 hours. Thyroid Function Tests: No results for input(s): TSH, T4TOTAL, FREET4, T3FREE, THYROIDAB in the last 72 hours. Anemia Panel: No results for input(s): VITAMINB12, FOLATE, FERRITIN, TIBC, IRON, RETICCTPCT in the last 72 hours. Sepsis Labs: No results for input(s): PROCALCITON, LATICACIDVEN in the last 168 hours.  Recent Results (from the past 240 hour(s))  MRSA PCR Screening     Status: Abnormal   Collection Time: 10/09/19  9:21 AM   Specimen: Nasal Mucosa; Nasopharyngeal  Result Value Ref Range Status   MRSA by PCR POSITIVE (A) NEGATIVE Final    Comment:        The GeneXpert MRSA Assay (FDA approved for NASAL specimens only), is one component of a comprehensive MRSA colonization surveillance program. It is not intended to diagnose MRSA infection nor to guide or monitor treatment for MRSA infections. RESULT CALLED TO, READ BACK  BY AND VERIFIED WITH: ROMINES,H. RN @1531  10/09/19 BILLINGSLEY,L Performed at [facility], [place] [address], [place], [place] [address]      Radiology Studies: DG CHEST PORT 1 VIEW  Result Date: 10/10/2019 CLINICAL DATA:  Hypoxia. EXAM: PORTABLE CHEST 1 VIEW COMPARISON:  10/09/2019 FINDINGS: The heart size is normal. NG tube was removed. Atherosclerotic changes are noted at the aortic arch. Asymmetric left-sided airspace disease is again seen. Left pleural effusion is suspected. The  right lung remains clear. IMPRESSION: 1. Stable asymmetric left-sided airspace disease and probable effusion. Findings are consistent with pneumonia. 2. Atherosclerosis of the thoracic aorta. Electronically Signed   By: San Morelle M.D.   On: 10/10/2019 06:15   DG CHEST PORT 1 VIEW  Result Date: 10/09/2019 CLINICAL DATA:  Follow-up atelectasis involving the LEFT lung related to mucous plugging. EXAM: PORTABLE CHEST 1 VIEW 12:20 p.m.: COMPARISON:  Portable chest x-ray earlier same day 12:35 a.m. and previously. FINDINGS: Since the examination earlier same day, the nasogastric tube has withdrawn such that its tip is now in the distal esophagus. The tube should be readvanced. Interval improved aeration in the LEFT lung since earlier today, though consolidation persists in the LEFT LOWER LOBE. There are patchy airspace opacities in the LEFT UPPER LOBE. RIGHT lung remains clear. Dense calcifications in the LEFT breast are likely benign and dystrophic in origin. IMPRESSION: 1. The nasogastric tube has withdrawn such that its tip is now in the distal esophagus. The tube should be readvanced. 2. Improved aeration in the LEFT lung since earlier today, though dense atelectasis and/or pneumonia persists in the LEFT LOWER LOBE and there is patchy pneumonia involving the LEFT UPPER LOBE. These results will be called to the ordering clinician or representative by the Radiologist Assistant, and communication  documented in the PACS or zVision Dashboard. Electronically Signed   By: Evangeline Dakin M.D.   On: 10/09/2019 12:51   DG CHEST PORT 1 VIEW  Result Date: 10/09/2019 CLINICAL DATA:  Increased oxygen demand. EXAM: PORTABLE CHEST 1 VIEW COMPARISON:  Radiograph 10/07/2019, CT 09/22/2019 FINDINGS: Significant increased volume loss the left hemithorax with increased opacity in the perihilar region and left lung base. There is leftward mediastinal shift, likely accentuated by patient rotation. Right lung is clear. No visualized pneumothorax. Surgical clips in the left axilla. Calcifications project over the left breast. Diffuse bony under mineralization. Chronic change about the left shoulder. Enteric tube in place with tip below the diaphragm. IMPRESSION: Increased volume loss in the left hemithorax over the past 2 days with increased opacity in the perihilar region and left lung base. Favor mucous plugging or aspiration given volume loss, re-accumulation of left pleural fluid is also considered but felt less likely. Electronically Signed   By: Keith Rake M.D.   On: 10/09/2019 01:10    Scheduled Meds:  albuterol  2.5 mg Nebulization QID   chlorhexidine  15 mL Mouth Rinse BID   Chlorhexidine Gluconate Cloth  6 each Topical Daily   collagenase   Topical Daily   donepezil  10 mg Per Tube QHS   feeding supplement (PRO-STAT SUGAR FREE 64)  30 mL Per Tube Daily   furosemide  40 mg Intravenous Daily   Gerhardt's butt cream   Topical BID   heparin  5,000 Units Subcutaneous Q8H   mouth rinse  15 mL Mouth Rinse q12n4p   memantine  10 mg Per Tube BID   mupirocin ointment  1 application Nasal BID   sennosides  5 mL Per Tube BID   sodium chloride HYPERTONIC  2 mL Nebulization BID   Continuous Infusions:  sodium chloride Stopped (10/10/19 0257)   amiodarone Stopped (10/09/19 1848)   ampicillin-sulbactam (UNASYN) IV Stopped (10/10/19 1624)   feeding supplement (OSMOLITE 1.2 CAL)  Stopped (10/09/19 0200)   lactated ringers 75 mL/hr at 10/10/19 1700   vancomycin Stopped (10/10/19 1241)     LOS: 24 days   Marylu Lund, MD [facility] Pager On Amion  If 7PM-7AM, please contact night-coverage 10/10/2019, 5:24 PM

## 2019-10-10 NOTE — Consult Note (Signed)
Barbara Flowers [birth year]  MD:[id number].    Requesting MD: Dr. 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  She has been admitted now for several weeks secondary to respiratory failure with MRSA PNA, encephalopathy, bacteremia, intermittent a fib.  She has had a complex hospital course secondary to intubation, extubation, and worsening respiratory status along with further complicating features from the aforementioned problems.  She presented with a decubitus ulcer already present, likely secondary to her bed bound/wheelchair bound status.  WOC evaluated the patient and recommended santyl.  Unfortunately the wound has progressed per their note and a surgical consultation was recommended.  ROS: ROS: Unable, patient is noncommunicative today.  Family History  Problem Relation Age of Onset   Hypertension Mother    Stroke Mother    Heart disease Father    Hypertension Father     Past Medical History:  Diagnosis Date   Anemia    Anemia    Anxiety disorder    Blood transfusion without reported diagnosis    Brain cancer [facility])    s/p sterotactic radio surgery   Brain tumor ([place])    Breast cancer ([place])    Cancer ([place]) 1990   L breast   H/O bone marrow transplant ([place])    Hypothyroidism    IBS (irritable bowel syndrome)    MDD (major depressive disorder)    Memory loss    Obesity    Orthostatic hypotension    Seizure ([place])    Seizures ([place])    Stroke ([place]) 2005   hemorrhagic   Stroke ([place])    Subdural hematoma ([facility])    Upper brachial plexus paralysis syndrome    Urinary incontinence     Past Surgical History:  Procedure Laterality Date   BREAST LUMPECTOMY Left    post chemotherapy and radiation   CHOLECYSTECTOMY     CRANIOTOMY FOR HEMISPHERECTOMY TOTAL / PARTIAL Right      GASTRIC BYPASS  1985   HAND SURGERY Left 2015   HAND TENDON SURGERY Left    for brachial plexus injury   LYMPH NODE BIOPSY      Social History:  reports that she has never smoked. She has never used smokeless tobacco. She reports current alcohol use. She reports that she does not use drugs.  Allergies:  Allergies  Allergen Reactions   Gabapentin Other (See Comments)    seizure   Tramadol Other (See Comments)    Brings on Seizures     Medications Prior to Admission  Medication Sig Dispense Refill   ALPRAZolam (XANAX) 1 MG tablet Take 1 mg by mouth at bedtime as needed for anxiety.     Ascorbic Acid (VITAMIN C) 1000 MG tablet Take 1,000 mg by mouth daily.     aspirin EC 81 MG tablet Take 81 mg by mouth daily.     atorvastatin (LIPITOR) 20 MG tablet Take 20 mg by mouth at bedtime.     azithromycin (ZITHROMAX) 250 MG tablet Take 1 tablet by mouth taper from 4 doses each day to 1 dose and stop.     Cholecalciferol (VITAMIN D3) 50 MCG (2000 UT) TABS Take 2,000 Units by mouth daily.     donepezil (ARICEPT) 10 MG tablet Take 10 mg by mouth at bedtime.     ferrous sulfate 325 (65 FE) MG EC tablet Take 325 mg  by mouth daily with breakfast.     memantine (NAMENDA) 10 MG tablet Take 10 mg by mouth 2 (two) times daily.      midodrine (PROAMATINE) 10 MG tablet Take 10 mg by mouth 3 (three) times daily.      Multiple Vitamins-Minerals (MULTIVITAMIN WITH MINERALS) tablet Take 1 tablet by mouth daily.     pantoprazole (PROTONIX) 40 MG tablet Take 40 mg by mouth daily.     Potassium 99 MG TABS Take 99 mg by mouth daily.     QUEtiapine (SEROQUEL) 100 MG tablet Take 100 mg by mouth at bedtime.     chlorhexidine (PERIDEX) 0.12 % solution 15 mLs by Mouth Rinse route 2 (two) times daily. (Patient not taking: Reported on 09/16/2019) 120 mL 0   magnesium oxide (MAG-OX) 400 (241.3 Mg) MG tablet Take 1 tablet (400 mg total) by mouth 2 (two) times daily. (Patient not taking: Reported  on 09/16/2019) 60 tablet 0     Physical Exam: Blood pressure 137/72, pulse 71, temperature 98.8 F (37.1 C), temperature source Axillary, resp. rate 17, height 5\' 8"  (1.727 m), weight 57 kg, SpO2 100 %. General: noncommunicative white female who is laying in bed in NAD HEENT: head is normocephalic, atraumatic appearing with a toboggan on.  Sclera are noninjected.  PERRL.  Ears and nose without any masses or lesions.  Mouth is pink Heart: irregularly irregular, tachy in 130-180s.  Normal s1,s2. No obvious murmurs, gallops, or rubs noted.  Palpable radial and pedal pulses bilaterally Lungs: few rhonchi, respiratory effort nonlabored, some congestion and weak cough as well Abd: soft, NT, ND, +BS, no masses, hernias, or organomegaly Skin: warm and dry with no masses, lesions, or rashes.  Stage 4 sacral decubitus ulcer.  See [facility] note for measurements.  Eschar noted over wound with no connective tissue holding it in place.  This was cut off with scissors.  Base of the wound with some necrotic fibrin and palpable, visible exposed bone. Psych: nonverbal    Results for orders placed or performed during the hospital encounter of 09/16/19 (from the past 48 hour(s))  Blood gas, arterial     Status: Abnormal   Collection Time: 10/09/19 12:34 AM  Result Value Ref Range   FIO2 68.00    O2 Content 12 L L/min   Delivery systems NASAL CANNULA    pH, Arterial 7.525 (H) 7.350 - 7.450   pCO2 arterial 34.0 32.0 - 48.0 mmHg   pO2, Arterial 56.9 (L) 83.0 - 108.0 mmHg   Bicarbonate 27.9 20.0 - 28.0 mmol/L   Acid-Base Excess 5.6 (H) 0.0 - 2.0 mmol/L   O2 Saturation 89.0 %   Patient temperature 98.6    Collection site LEFT RADIAL    Drawn by [id number]    Allens test (pass/fail) PASS PASS    Comment: Performed at [facility] [address] [place], [place] [address]  Comprehensive metabolic panel     Status: Abnormal   Collection Time: 10/09/19  1:37 AM  Result Value Ref Range   Sodium  141 135 - 145 mmol/L   Potassium 4.7 3.5 - 5.1 mmol/L   Chloride 102 98 - 111 mmol/L   CO2 29 22 - 32 mmol/L   Glucose, Bld 117 (H) 70 - 99 mg/dL   BUN 44 (H) 8 - 23 mg/dL   Creatinine, Ser 0.59 0.44 - 1.00 mg/dL   Calcium 9.1 8.9 - 10.3 mg/dL   Total Protein 6.9 6.5 - 8.1 g/dL  Albumin 2.6 (L) 3.5 - 5.0 g/dL   AST 63 (H) 15 - 41 U/L   ALT 62 (H) 0 - 44 U/L   Alkaline Phosphatase 96 38 - 126 U/L   Total Bilirubin 1.1 0.3 - 1.2 mg/dL   GFR calc non Af Amer >60 >60 mL/min   GFR calc Af Amer >60 >60 mL/min   Anion gap 10 5 - 15    Comment: Performed at [facility], [place] [address] [place], [place] [address]  CBC     Status: Abnormal   Collection Time: 10/09/19  1:37 AM  Result Value Ref Range   WBC 19.4 (H) 4.0 - 10.5 K/uL   RBC 3.08 (L) 3.87 - 5.11 MIL/uL   Hemoglobin 9.0 (L) 12.0 - 15.0 g/dL   HCT 30.1 (L) 36.0 - 46.0 %   MCV 97.7 80.0 - 100.0 fL   MCH 29.2 26.0 - 34.0 pg   MCHC 29.9 (L) 30.0 - 36.0 g/dL   RDW 14.7 11.5 - 15.5 %   Platelets 443 (H) 150 - 400 K/uL   nRBC 0.2 0.0 - 0.2 %    Comment: Performed at [facility], [place] [address], [place], [place] [address]  MRSA PCR Screening     Status: Abnormal   Collection Time: 10/09/19  9:21 AM   Specimen: Nasal Mucosa; Nasopharyngeal  Result Value Ref Range   MRSA by PCR POSITIVE (A) NEGATIVE    Comment:        The GeneXpert MRSA Assay (FDA approved for NASAL specimens only), is one component of a comprehensive MRSA colonization surveillance program. It is not intended to diagnose MRSA infection nor to guide or monitor treatment for MRSA infections. RESULT CALLED TO, READ BACK BY AND VERIFIED WITH: ROMINES,H. RN @1531  10/09/19 BILLINGSLEY,L Performed at [facility], [place] [address] [place], [place] [address]   Ammonia     Status: None   Collection Time: 10/09/19 10:13 AM  Result Value Ref Range   Ammonia 16 9 - 35 umol/L    Comment: Performed at [facility], [place] [address] [place], [place] [address]  Blood gas, arterial     Status: Abnormal   Collection Time: 10/09/19 11:25 AM  Result Value Ref Range   FIO2 48.00    pH, Arterial 7.463 (H) 7.350 - 7.450   pCO2 arterial 40.7 32.0 - 48.0 mmHg   pO2, Arterial 119 (H) 83.0 - 108.0 mmHg   Bicarbonate 28.7 (H) 20.0 - 28.0 mmol/L   Acid-Base Excess 5.0 (H) 0.0 - 2.0 mmol/L   O2 Saturation 97.8 %   Patient temperature 98.6    Collection site RIGHT RADIAL    Drawn by [id number]    Sample type ARTERIAL     Comment: Performed at [facility], [place] [address] [place], [place] [address]  Magnesium     Status: None   Collection Time: 10/09/19  7:00 PM  Result Value Ref Range   Magnesium 2.3 1.7 - 2.4 mg/dL    Comment: Performed at [facility], [place] [address] [place], [place] [address]  Comprehensive metabolic panel     Status: Abnormal   Collection Time: 10/10/19  5:00 AM  Result Value Ref Range   Sodium 144 135 - 145 mmol/L   Potassium 3.7 3.5 - 5.1 mmol/L    Comment: DELTA CHECK NOTED   Chloride 107 98 - 111 mmol/L   CO2 26 22 - 32 mmol/L  Glucose, Bld 96 70 - 99 mg/dL   BUN 49 (H) 8 - 23 mg/dL   Creatinine, Ser 0.65 0.44 - 1.00 mg/dL   Calcium 8.5 (L) 8.9 - 10.3 mg/dL   Total Protein 6.6 6.5 - 8.1 g/dL   Albumin 2.5 (L) 3.5 - 5.0 g/dL   AST 37 15 - 41 U/L   ALT 48 (H) 0 - 44 U/L   Alkaline Phosphatase 98 38 - 126 U/L   Total Bilirubin 0.8 0.3 - 1.2 mg/dL   GFR calc non Af Amer >60 >60 mL/min   GFR calc Af Amer >60 >60 mL/min   Anion gap 11 5 - 15    Comment: Performed at [facility], [place] [address], [place], [place] [address]  CBC     Status: Abnormal   Collection Time: 10/10/19  5:00 AM  Result Value Ref Range   WBC 17.0 (H) 4.0 - 10.5 K/uL   RBC 2.91 (L) 3.87 - 5.11 MIL/uL   Hemoglobin 8.4 (L) 12.0 - 15.0 g/dL   HCT 28.7 (L) 36.0 - 46.0 %   MCV 98.6 80.0 - 100.0 fL   MCH 28.9 26.0 - 34.0 pg    MCHC 29.3 (L) 30.0 - 36.0 g/dL   RDW 14.4 11.5 - 15.5 %   Platelets 383 150 - 400 K/uL   nRBC 0.0 0.0 - 0.2 %    Comment: Performed at [facility], [place] [address] [place], [place] [address]   DG CHEST PORT 1 VIEW  Result Date: 10/10/2019 CLINICAL DATA:  Hypoxia. EXAM: PORTABLE CHEST 1 VIEW COMPARISON:  10/09/2019 FINDINGS: The heart size is normal. NG tube was removed. Atherosclerotic changes are noted at the aortic arch. Asymmetric left-sided airspace disease is again seen. Left pleural effusion is suspected. The right lung remains clear. IMPRESSION: 1. Stable asymmetric left-sided airspace disease and probable effusion. Findings are consistent with pneumonia. 2. Atherosclerosis of the thoracic aorta. Electronically Signed   By: San Morelle M.D.   On: 10/10/2019 06:15   DG CHEST PORT 1 VIEW  Result Date: 10/09/2019 CLINICAL DATA:  Follow-up atelectasis involving the LEFT lung related to mucous plugging. EXAM: PORTABLE CHEST 1 VIEW 12:20 p.m.: COMPARISON:  Portable chest x-ray earlier same day 12:35 a.m. and previously. FINDINGS: Since the examination earlier same day, the nasogastric tube has withdrawn such that its tip is now in the distal esophagus. The tube should be readvanced. Interval improved aeration in the LEFT lung since earlier today, though consolidation persists in the LEFT LOWER LOBE. There are patchy airspace opacities in the LEFT UPPER LOBE. RIGHT lung remains clear. Dense calcifications in the LEFT breast are likely benign and dystrophic in origin. IMPRESSION: 1. The nasogastric tube has withdrawn such that its tip is now in the distal esophagus. The tube should be readvanced. 2. Improved aeration in the LEFT lung since earlier today, though dense atelectasis and/or pneumonia persists in the LEFT LOWER LOBE and there is patchy pneumonia involving the LEFT UPPER LOBE. These results will be called to the ordering clinician or representative by the Radiologist  Assistant, and communication documented in the PACS or zVision Dashboard. Electronically Signed   By: Evangeline Dakin M.D.   On: 10/09/2019 12:51   DG CHEST PORT 1 VIEW  Result Date: 10/09/2019 CLINICAL DATA:  Increased oxygen demand. EXAM: PORTABLE CHEST 1 VIEW COMPARISON:  Radiograph 10/07/2019, CT 09/22/2019 FINDINGS: Significant increased volume loss the left hemithorax with increased opacity in the perihilar region and left  lung base. There is leftward mediastinal shift, likely accentuated by patient rotation. Right lung is clear. No visualized pneumothorax. Surgical clips in the left axilla. Calcifications project over the left breast. Diffuse bony under mineralization. Chronic change about the left shoulder. Enteric tube in place with tip below the diaphragm. IMPRESSION: Increased volume loss in the left hemithorax over the past 2 days with increased opacity in the perihilar region and left lung base. Favor mucous plugging or aspiration given volume loss, re-accumulation of left pleural fluid is also considered but felt less likely. Electronically Signed   By: Keith Rake M.D.   On: 10/09/2019 01:10      Assessment/Plan MMP  Stage 4 sacral wound -The top layer of eschar was debrided from the wound.  The wound is now fully open.  It would benefit from PT hydrotherapy for further debridement as well as continuation of santyl and WD dressing changes BID.  No further plans for surgical intervention.  Will defer further wound care, management, and follow up to primary service and WOC.   She can follow up with the wound clinic as an outpatient if she survives.  Would not recommend any type of debridement in the OR given her poor respiratory status and poor overall status.  Thank you for this consultation, we will sign off.  Henreitta Cea, [facility] 10/10/2019, 10:08 AM Please see Amion for pager number during day hours 7:00am-4:30pm or 7:00am -11:30am on weekends

## 2019-10-10 NOTE — Progress Notes (Signed)
PT HYDROTHERAPY EVALUATION     10/10/19 1700  Subjective Assessment  Subjective nonverbal. pt slept through some of session. She expressed some discomfort. Unsure if wound was painful or water was cold.  Date of Onset  (unknown-present on admission)  Evaluation and Treatment  Evaluation and Treatment Procedures Explained to Patient/Family Yes  Evaluation and Treatment Procedures Patient unable to consent due to mental status (pt was nonverbal during session. asleep for some of session)  Pressure Injury 09/17/19 Sacrum Medial;Left Unstageable - Full thickness tissue loss in which the base of the injury is covered by slough (yellow, tan, gray, green or brown) and/or eschar (tan, brown or black) in the wound bed. wound w eschar  Date First Assessed/Time First Assessed: 09/17/19 0245   Location: Sacrum  Location Orientation: Medial;Left  Staging: Unstageable - Full thickness tissue loss in which the base of the injury is covered by slough (yellow, tan, gray, green or brown) an...  Dressing Type Foam - Lift dressing to assess site every shift;Gauze (Comment) (Santyl)  Dressing Changed  Dressing Change Frequency Daily  State of Healing Non-healing  Site / Wound Assessment Black;Brown;Pink;Yellow  % Wound base Red or Granulating 30%  % Wound base Yellow/Fibrinous Exudate 60%  % Wound base Black/Eschar 10%  Peri-wound Assessment Black;Pink  Wound Length (cm) 14 cm  Wound Width (cm) 6 cm  Wound Depth (cm) 2.5 cm  Wound Surface Area (cm^2) 84 cm^2  Wound Volume (cm^3) 210 cm^3  Tunneling (cm)  (1:00-3cm; 6:00-2cm)  Undermining (cm)  (12:00. possibly necrotic tissue that can be removed)  Margins Unattached edges (unapproximated)  Drainage Amount Minimal  Drainage Description Odor;Serosanguineous  Treatment Debridement (Selective);Hydrotherapy (Pulse lavage);Packing (Saline gauze) (Enzymatic debridement)  Hydrotherapy  Pulsed lavage therapy - wound location sacrum  Pulsed Lavage with  Suction (psi) 6 psi  Pulsed Lavage with Suction - Normal Saline Used 1000 mL  Pulsed Lavage Tip Tip with splash shield  Selective Debridement  Selective Debridement - Location sacrum  Selective Debridement - Tools Used Forceps;Scissors  Selective Debridement - Tissue Removed yellow slough  Wound Therapy - Assess/Plan/Recommendations  Wound Therapy - Clinical Statement 69 yo female admitted to hospital with weakness. Hx of CVA with L residual weakness, dementia, Parkinsons, sacral wounds, contractures. WC bound PTA.   Wound Therapy - Functional Problem List CVA with L residual weakness, contractures, Parkinson's  Factors Delaying/Impairing Wound Healing Incontinence;Immobility  Hydrotherapy Plan Debridement;Dressing change;Patient/family education;Pulsatile lavage with suction  Wound Therapy - Frequency 6X / week  Wound Therapy - Current Recommendations WOC nurse;Case manager/social work (Palliative)  Wound Therapy - Follow Up Recommendations Wound Care Center;Skilled nursing facility  Wound Plan Palliative following. Questionable Prognosis. Will plan to continue hydro, dressing changes. Will assess and update recommendations as needed.   Wound Therapy Goals - Improve the function of patient's integumentary system by progressing the wound(s) through the phases of wound healing by:  Decrease Necrotic Tissue to 30  Decrease Necrotic Tissue - Progress Goal set today  Increase Granulation Tissue to 70  Increase Granulation Tissue - Progress Goal set today  Goals/treatment plan/discharge plan were made with and agreed upon by patient/family No, Patient unable to participate in goals/treatment/discharge plan and family unavailable  Time For Goal Achievement 2 weeks  Wound Therapy - Potential for Goals Poor    Doreatha Massed, PT Acute Rehabilitation

## 2019-10-10 NOTE — Consult Note (Signed)
Consultation Note Date: 10/10/2019   Patient Name: Barbara Flowers  DOB: 1951-02-16  MRN: [id number]  Age / Sex: 69 y.o., female  PCP: [name], NP Referring Physician: Donne Hazel, MD  Reason for Consultation: Establishing goals of care  HPI/Patient Profile: 69 y.o. female admitted on 09/16/2019  Barbara Flowers is 69 years old.  She has a past medical history significant for having had a stroke, contractures, uses wheelchair.  Admitted to the hospital with respiratory failure and MRSA pneumonia.  Hospital course complicated by encephalopathy, episode of ventilator dependent respiratory failure, bacteremia and intermittent atrial fibrillation.  She has decubitus ulcer.  She remains admitted under hospital medicine and is being followed by surgery, critical care medicine wound and ostomy as well as respiratory therapist.  Palliative medicine consultation for goals of care discussions has been requested.  Clinical Assessment and Goals of Care: Patient seen briefly when wound care nurses were at the bedside.  Does not appear to be in acute distress.  Nonverbal.  Call placed and introduced myself to the patient's husband Barbara Flowers.  Palliative medicine is specialized medical care for people living with serious illness. It focuses on providing relief from the symptoms and stress of a serious illness. The goal is to improve quality of life for both the patient and the family.  Goals of care: Broad aims of medical therapy in relation to the patient's values and preferences. Our aim is to provide medical care aimed at enabling patients to achieve the goals that matter most to them, given the circumstances of their particular medical situation and their constraints.   On today's initial phone call, discussed that this initial palliative medicine introductory phone call was an attempt to establish rapport and an  attempt to understand the patient better.  Patient's husband will arrive at the hospital tomorrow, palliative provider will attempt to meet for further discussions, see below.  Thank you for the consult.  NEXT OF KIN Husband, son and daughter.   SUMMARY OF RECOMMENDATIONS   Full code/full scope care Continue current mode of care Goals remain for any and all life maintaining/life-prolonging measures to continue We will attempt to meet with the patient's husband face-to-face in a.m. on 10-11-2019 for offering additional support and further conversations.  Code Status/Advance Care Planning:  Full code    Symptom Management:   Continue current mode of care   Palliative Prophylaxis:   Bowel Regimen  Additional Recommendations (Limitations, Scope, Preferences):  Full Scope Treatment  Psycho-social/Spiritual:   Desire for further Chaplaincy support:yes  Additional Recommendations: Caregiving  Support/Resources  Prognosis:   Unable to determine  Discharge Planning: To Be Determined      Primary Diagnoses: Present on Admission: . Dementia ([place]) . CAP (community acquired pneumonia) . Pleural effusion on left . Acute prerenal azotemia . Hypernatremia   I have reviewed the medical record, interviewed the patient and family, and examined the patient. The following aspects are pertinent.  Past Medical History:  Diagnosis Date  . Anemia   . Anemia   .  Anxiety disorder   . Blood transfusion without reported diagnosis   . Brain cancer [facility]    s/p sterotactic radio surgery  . Brain tumor ([place])   . Breast cancer ([place])   . Cancer ([place]) 1990   L breast  . H/O bone marrow transplant ([place])   . Hypothyroidism   . IBS (irritable bowel syndrome)   . MDD (major depressive disorder)   . Memory loss   . Obesity   . Orthostatic hypotension   . Seizure ([place])   . Seizures ([place])   . Stroke [facility]) 2005   hemorrhagic  . Stroke ([place])   . Subdural hematoma ([place])   . Upper  brachial plexus paralysis syndrome   . Urinary incontinence    Social History   Socioeconomic History  . Marital status: Married    Spouse name: Not on file  . Number of children: 1  . Years of education: Not on file  . Highest education level: Not on file  Occupational History  . Not on file  Tobacco Use  . Smoking status: Never Smoker  . Smokeless tobacco: Never Used  Substance and Sexual Activity  . Alcohol use: Yes    Alcohol/week: 0.0 standard drinks    Comment: socially  . Drug use: Never  . Sexual activity: Not on file  Other Topics Concern  . Not on file  Social History Narrative   Patient lives at home with her family.   Caffeine Use: Yes   Social Determinants of Health   Financial Resource Strain:   . Difficulty of Paying Living Expenses: Not on file  Food Insecurity:   . Worried About Charity fundraiser in the Last Year: Not on file  . Ran Out of Food in the Last Year: Not on file  Transportation Needs:   . Lack of Transportation (Medical): Not on file  . Lack of Transportation (Non-Medical): Not on file  Physical Activity:   . Days of Exercise per Week: Not on file  . Minutes of Exercise per Session: Not on file  Stress:   . Feeling of Stress : Not on file  Social Connections:   . Frequency of Communication with Friends and Family: Not on file  . Frequency of Social Gatherings with Friends and Family: Not on file  . Attends Religious Services: Not on file  . Active Member of Clubs or Organizations: Not on file  . Attends Archivist Meetings: Not on file  . Marital Status: Not on file   Family History  Problem Relation Age of Onset  . Hypertension Mother   . Stroke Mother   . Heart disease Father   . Hypertension Father    Scheduled Meds: . albuterol  2.5 mg Nebulization QID  . chlorhexidine  15 mL Mouth Rinse BID  . Chlorhexidine Gluconate Cloth  6 each Topical Daily  . collagenase   Topical Daily  . donepezil  10 mg Per Tube QHS    . feeding supplement (PRO-STAT SUGAR FREE 64)  30 mL Per Tube Daily  . furosemide  40 mg Intravenous Daily  . Gerhardt's butt cream   Topical BID  . heparin  5,000 Units Subcutaneous Q8H  . mouth rinse  15 mL Mouth Rinse q12n4p  . memantine  10 mg Per Tube BID  . mupirocin ointment  1 application Nasal BID  . sennosides  5 mL Per Tube BID  . sodium chloride HYPERTONIC  2 mL Nebulization BID   Continuous Infusions: .  sodium chloride Stopped (10/10/19 0257)  . amiodarone Stopped (10/09/19 1848)  . ampicillin-sulbactam (UNASYN) IV Stopped (10/10/19 1624)  . feeding supplement (OSMOLITE 1.2 CAL) Stopped (10/09/19 0200)  . lactated ringers 75 mL/hr at 10/10/19 1600  . vancomycin Stopped (10/10/19 1241)   PRN Meds:.sodium chloride, acetaminophen (TYLENOL) oral liquid 160 mg/5 mL, albuterol, docusate, labetalol, sodium chloride flush Medications Prior to Admission:  Prior to Admission medications   Medication Sig Start Date End Date Taking? Authorizing Provider  ALPRAZolam Duanne Moron) 1 MG tablet Take 1 mg by mouth at bedtime as needed for anxiety.   Yes [provider]  Ascorbic Acid (VITAMIN C) 1000 MG tablet Take 1,000 mg by mouth daily.   Yes [provider]  aspirin EC 81 MG tablet Take 81 mg by mouth daily.   Yes [provider]  atorvastatin (LIPITOR) 20 MG tablet Take 20 mg by mouth at bedtime. 09/05/19  Yes [provider]  azithromycin (ZITHROMAX) 250 MG tablet Take 1 tablet by mouth taper from 4 doses each day to 1 dose and stop. 09/11/19  Yes [provider]  Cholecalciferol (VITAMIN D3) 50 MCG (2000 UT) TABS Take 2,000 Units by mouth daily.   Yes [provider]  donepezil (ARICEPT) 10 MG tablet Take 10 mg by mouth at bedtime.   Yes [provider]  ferrous sulfate 325 (65 FE) MG EC tablet Take 325 mg by mouth daily with breakfast.   Yes [provider]  memantine (NAMENDA) 10 MG tablet Take 10 mg by mouth 2  (two) times daily.    Yes [provider]  midodrine (PROAMATINE) 10 MG tablet Take 10 mg by mouth 3 (three) times daily.    Yes [provider]  Multiple Vitamins-Minerals (MULTIVITAMIN WITH MINERALS) tablet Take 1 tablet by mouth daily.   Yes [provider]  pantoprazole (PROTONIX) 40 MG tablet Take 40 mg by mouth daily. 08/29/19  Yes [provider]  Potassium 99 MG TABS Take 99 mg by mouth daily.   Yes [provider]  QUEtiapine (SEROQUEL) 100 MG tablet Take 100 mg by mouth at bedtime.   Yes [provider]  chlorhexidine (PERIDEX) 0.12 % solution 15 mLs by Mouth Rinse route 2 (two) times daily. Patient not taking: Reported on 09/16/2019 05/27/17   Georgette Shell, MD  magnesium oxide (MAG-OX) 400 (241.3 Mg) MG tablet Take 1 tablet (400 mg total) by mouth 2 (two) times daily. Patient not taking: Reported on 09/16/2019 05/27/17   Georgette Shell, MD   Allergies  Allergen Reactions  . Gabapentin Other (See Comments)    seizure  . Tramadol Other (See Comments)    Brings on Seizures    Review of Systems Non verbal  Physical Exam Nonverbal at baseline Resting in bed Monitor noted to be irregularly irregular Wound and ostomy nurses at the bedside, their notes have been reviewed for patient stage IV sacral decubitus ulcer Appears to have nonlabored effort of breathing Appears with generalized weakness  Vital Signs: BP (!) 108/35 (BP Location: Left Leg)   Pulse (!) 58   Temp 97.6 F (36.4 C) (Axillary)   Resp 17   Ht 5\' 8"  (1.727 m)   Wt 57 kg   SpO2 100%   BMI 19.11 kg/m  Pain Scale: PAINAD   Pain Score: Asleep   SpO2: SpO2: 100 % O2 Device:SpO2: 100 % O2 Flow Rate: .O2 Flow Rate (L/min): 2 L/min  IO: Intake/output summary:   Intake/Output Summary (Last  24 hours) at 10/10/2019 1642 Last data filed at 10/10/2019 1600 Gross per 24 hour  Intake 1744.89 ml  Output 1250 ml  Net 494.89 ml    LBM: Last BM Date:  10/07/19 Baseline Weight: Weight: 68 kg Most recent weight: Weight: 57 kg     Palliative Assessment/Data:   PPS 30%  Time In:  1530 Time Out:  1630 Time Total:  60  Greater than 50%  of this time was spent counseling and coordinating care related to the above assessment and plan.  Signed by: Loistine Chance, MD   Please contact Palliative Medicine Team phone at [phone] for questions and concerns.  For individual provider: See Shea Evans

## 2019-10-10 NOTE — Progress Notes (Addendum)
NAME:  Barbara Flowers, MRN:  [id number], DOB:  [birth year], LOS: 24 ADMISSION DATE:  09/16/2019, CONSULTATION DATE:  12/26  REFERRING MD:  Earlie Counts, CHIEF COMPLAINT: Shortness of breath, altered mental status  Brief History   69 yowf never smoker with h/o breast ca s/p bone marrow transplant early 1990s in [place] and cancer free s recent rx but problems with sundowning/ dementia and w/c bound x 3 years p cva acutely worse x one week PTA with cough/ congestion rx over the phone by PCP since 12/21 and gradually worse x 2 days PTA with poor po intake, ams and weakness with mostly mucoid sputum > to ER pm 12/26 with extensive L infiltrate and L effusion with hypoxemia corrected on non rebreathing and PCCM service asked to admit. Signed off to the hospitalist service Was asked to see her again for altered mental status, hypoxemia, shortness of breath Post thoracentesis for about 820 cc of fluid Has MRSA pneumonia  Past Medical History   Past Medical History:  Diagnosis Date  . Anemia   . Anemia   . Anxiety disorder   . Blood transfusion without reported diagnosis   . Brain cancer [facility])    s/p sterotactic radio surgery  . Brain tumor ([place])   . Breast cancer ([place])   . Cancer ([place]) 1990   L breast  . H/O bone marrow transplant ([place])   . Hypothyroidism   . IBS (irritable bowel syndrome)   . MDD (major depressive disorder)   . Memory loss   . Obesity   . Orthostatic hypotension   . Seizure ([place])   . Seizures ([place])   . Stroke [facility]) 2005   hemorrhagic  . Stroke ([place])   . Subdural hematoma ([place])   . Upper brachial plexus paralysis syndrome   . Urinary incontinence    Significant Hospital Events   12/28 thora 1/2 thora 1/3 intubated and transferred to ICU 1/4 weaning vent x 6 hours  1/7 weaning OK but copious secretions 1/8 started vanc for MRSA sputum 1/11 not weaning well 1/12 did 30 minutes of pressure support, secretions also slightly improved 1/13 more awake weaning x 2  hrs->spoke w/ family still want full support 1/14 acceptable rapid shallow. Following commands, extubated.  Sliding scale insulin discontinued as she had been euglycemic not requiring insulin 1/15 looks better ready for floor. PCCM sign off.  1/18 Rapid response overnight, suspected ongoing aspiration copious thick secretions, poor mentation. PCCM re-engaged   Consults:  PCCM  Procedures:  1/3 ETT > 1/14  Significant Diagnostic Tests:  Blood culture 12/26  12/28 bedside thoracentesis 200 cc 1/2 thoracentesis 820 cc of fluid drained Chest x-ray 09/24/2019-bilateral pleural effusions, possible left lower lobe infiltrate ECHO 1/3 LVEF 25-30%, severely decreased LV systolic function. LV grade II diastolic dysfunction. RV severely reduced systolic function. Trivial pericardial effusion , L pleural effusion is seen 1/6 CXR> dense left lower lobe opacity silhouetting the left hemidiaphragm 1/6 upper extremity ultrasounds-no DVTs 1/16 CT H> no acute intracranial changes. Advanced generalized cerebral atrophy. Chronic small vessel ischemic changes to pons, Old L cerebellar infarct. Old L thalamus infarct. Prior R parietal craniotomy with underlying encephalomalacia, parenchymal calcification. 1/18 CXR> L sided volume loss with L hemithorax, increased L basilar opacity from prior. Clear R lung.   Micro Data:  Covid 19 PCR 12/26 >> neg RVP 12/26 >> neg  Procalcitonin 12/26 >> 2.01, down trended to 0.4 with abx BCx2 12/26 >> Pan sensitive Proteus Mirabilis UC 12/26 >> negative Urine Pneum  Ag 12/26 >> positive Respiratory culture 1/6>> MRSA  Antimicrobials:  Rocephin 12/26 >> (14 day course) 1/8 Azithromycin 12/27 > 28 Vancomycin 12/26, 1/7-1/12, 1/18 > Unasyn 1/18 >   Interim history/subjective:  RN reports no acute events overnight, minimally responsive this morning. Per nursing patient able to states name and location at baseline.   Objective   Blood pressure 137/72, pulse 71,  temperature 99.1 F (37.3 C), temperature source Axillary, resp. rate 17, height 5\' 8"  (1.727 m), weight 57 kg, SpO2 100 %. on Room Air        Intake/Output Summary (Last 24 hours) at 10/10/2019 0847 Last data filed at 10/10/2019 0800 Gross per 24 hour  Intake 1007.89 ml  Output 350 ml  Net 657.89 ml   Filed Weights   10/08/19 0500 10/09/19 0332 10/10/19 0500  Weight: 56.7 kg 56.2 kg 57 kg   Scheduled Meds: . albuterol  2.5 mg Nebulization QID  . chlorhexidine  15 mL Mouth Rinse BID  . Chlorhexidine Gluconate Cloth  6 each Topical Daily  . collagenase   Topical Daily  . donepezil  10 mg Per Tube QHS  . feeding supplement (PRO-STAT SUGAR FREE 64)  30 mL Per Tube Daily  . furosemide  40 mg Intravenous Daily  . Gerhardt's butt cream   Topical BID  . heparin  5,000 Units Subcutaneous Q8H  . mouth rinse  15 mL Mouth Rinse q12n4p  . memantine  10 mg Per Tube BID  . mupirocin ointment  1 application Nasal BID  . sennosides  5 mL Per Tube BID  . sodium chloride HYPERTONIC  2 mL Nebulization BID   Continuous Infusions: . sodium chloride Stopped (10/10/19 0257)  . amiodarone Stopped (10/09/19 1848)  . ampicillin-sulbactam (UNASYN) IV Stopped (10/10/19 0330)  . feeding supplement (OSMOLITE 1.2 CAL) Stopped (10/09/19 0200)  . vancomycin     PRN Meds:.sodium chloride, acetaminophen (TYLENOL) oral liquid 160 mg/5 mL, albuterol, docusate, labetalol, sodium chloride flush   Examination:  General: Chronically ill frail very thin appearing elderly female lying in bed, in NAD HEENT: [place]/AT, MM pink/moist, PERRL,  Neuro: Resist passive eye opening, would no state name or follow any commands this morning  CV: s1s2 regular rate and rhythm, no murmur, rubs, or gallops,  PULM:  Clear to ascultation bilaterally, no increased work of breathing, no added breath sounds.  GI: soft, bowel sounds active in all 4 quadrants, non-tender, non-distended Extremities: warm/dry, no edema  Skin: no rashes or  lesions  Resolved Hospital Problem list   MRSA PNA completed Rx 1/14 Proteus mirabilis bacteremia completed rx 1/8 Contraction alkalosis  Acute hypoxemic and hypercarbic respiratory failure  Transaminitis, mild  Assessment & Plan:   Acute respiratory failure with hypoxemia -in setting of MRSA pneumonia, poor cough mechanics and atelectasis/mucous plugging. Recurring pleural effusions s/p thora. -Underlying debility, failure to thrive  -intubated 1/3-1/14. Poor cough mechanics at time of extubation, hight risk for reintubation  Plan Aspiration precautions  PRN NTS suctioning  Chest PT  Hypertonic saline nebs  SPO2 goal of greater than 30 Titrate supplemental oxygen as able  Close monitoring of airway protection in ICU setting  Palliative consult   Acute metabolic encephalopathy in the setting of critical illness, hypoxia, recent and suspected ongoing sepsis, superimposed on chronic dementia (w/c bound at baseline), & prior CVA  -CT H 1/16 without acute intracranial changes, but with generalized brain atrophy.  -1/18 0000 ABG suggestive of hypoxemia, with PaO2 56 on 12L. 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 Paroxysmal atrial fibrillation with RVR -Ejection fraction of 25 to 30% Plan ICU monitoring  Daily consideration for diurese (none 1/19) Subq heparin  Not anticoagulated at baseline Daily weight  Follow urine output   Protein calorie malnutrition, moderate,  likely secondary to chronic illness -global muscle depletion  Plan Unable to place NGT due to patients inability to coordinate  If aggressive measures are desired post palliative consult discussion regarding PEG tube placement may need to be held   Sacral decubitus -Present on admission Plan WOC consulted Wound care Frequent turns   Goals of Care: -I worry that the patient's acute on chronic illness course may be insurmountable  -I anticipate we will continue to see progressive respiratory failure due to underlying failure to thrive and debilitation, unfortunately yielding very weak cough mechanics and likely ongoing aspiration with subsequent mucus plugging -I worry that reintubation with subsequent tracheostomy may not help fix the underlying causes of the patient's recurring decline and thus is may not be in the best interest of the patient. Palliative care will be placed 1/18 and I will approach the topic of goals of care with husband 1/18 upon his arrival.  -Long conversation with Father, son (neurosurgery resident/fellow at [employer] [facility] [employer]) on phone who favor aggressive intervention at this time. Discussed further with Dr. Valeta Harms who has advised that invasive ventilation will be unlikely to yield long term benefit   Best practice:  Diet: Descision per family discussion  Pain/Anxiety/Delirium protocol (if indicated):  na Glycemic control: monitor VAP protocol (if indicated): na DVT prophylaxis: Subcu heparin Mobility: Bedrest Code Status: Full code Family Communication: Will update spouse again today  Disposition: ICU  CRITICAL CARE Performed by: Johnsie Cancel  Total critical care time: 40 minutes  Critical care time was exclusive of separately billable procedures and treating other patients. Critical care was necessary to treat or prevent imminent or life-threatening deterioration.  Critical care was time spent personally by me on the following activities: development of treatment  plan with patient and/or surrogate as well as nursing, discussions with consultants, evaluation of patient's response to treatment, examination of patient, obtaining history from patient or surrogate, ordering and performing treatments and interventions, ordering and review of laboratory studies, ordering and review of radiographic studies, pulse oximetry and re-evaluation of patient's condition.  Johnsie Cancel, NP-C [facility] Contact / Pager information can be found on Amion  10/10/2019, 9:05 AM   PCCM attending:  This is a 69 year old history of breast cancer status post bone marrow transplant 1990s history of brain tumor status post stereotactic radiosurgery history of strokes, declining memory loss and dementia.  Overall, unfortunate situation of severe cachexia weakness recurrent mucous plugging and aspiration pneumonia.  BP (!) 119/52 (BP Location: Left Leg)   Pulse 77   Temp 98.8 F (37.1 C) (Axillary)   Resp 20   Ht 5\' 8"  (1.727 m)   Wt 57 kg   SpO2 91%   BMI 19.11 kg/m   General: Elderly debilitated cachectic female Neck: Trachea  midline Chest: Diminished breath sounds in the left, anterior rhonchi Heart: Regular rate and rhythm, S1-S2 Abdomen: Soft nontender nondistended Extremities: No significant edema  Chest x-ray: Improved following suctioning and chest PT. Labs reviewed  Assessment: Chronically debilitated 69 year old female with advanced dementia recurrent respiratory failure due to recurrent aspiration and left-sided mucous plugging.  Overall physically very debilitated.  She has had MRSA pneumonia during this hospitalization and her recurrent encephalopathy secondary to sepsis and declining dementia.  Also has acute on chronic systolic heart failure and protein calorie malnutrition.  Plan: Agree with ongoing palliative care discussions. Continue aggressive pulmonary toileting.  We were able to wean her to room air oxygen needs this  morning.  If needed please call. Continue with hypertonic saline nebs Albuterol nebs, vest percussive therapy and NT suctioning to help mobilize secretions.  Pulmonary will be available for any questions or concerns.  We appreciate palliative care input.  Garner Nash, DO [facility] 10/10/2019 10:43 AM

## 2019-10-10 NOTE — Progress Notes (Signed)
Nutrition Follow-up  DOCUMENTATION CODES:   Non-severe (moderate) malnutrition in context of chronic illness  INTERVENTION:  - will monitor for ongoing [place] discussions and for plan/wishes per family concerning nutrition support.    NUTRITION DIAGNOSIS:   Moderate Malnutrition related to chronic illness(wheelchair bound x3 years following CVA) as evidenced by mild fat depletion, mild muscle depletion. 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 She presented to the ED due to 1 week of worsening cough and congestion. She was also experiencing poor oral intake, AMS, and increased weakness.  Significant Events: 12/26- admission 12/29- NGT placement; TF initiation 1/3- intubation 1/14- extubation 1/15- transferred from 2 [place] to [place] and then to [place] 1/18- transferred back to 2 [place] after Rapid Response over night  Flow sheet documentation indicates that NGT removed yesterday; suspect it was accidentally dislodged during Rapid Response event that happened during night shift on 1/18 or on 1/18 afternoon. RN attempted several times to replace 1/18 and unsuccessful.   Able to talk with RN this AM and she reports that attempt x3 to replace NGT unsuccessful. Patient remains full code.   Patient's husband at bedside and concerned that patient is very weak and not receiving nutrition. Estimated nutrition needs updated based on medical course, new stage 2 wound, and documentation indicating worsening of sacral wound. Flow sheet indicates mild edema to all extremities.   Per notes: - debridement of sacral wound by Surgery and recommendation for  hydrotherapy; no plan for further surgical intervention - acute respiratory failure with hypoxemia--high risk for re-intubation - acute metabolic encephalopathy--a/o to self and situation - prior CVA and subsequently wheelchair bound x3 years PTA - sepsis--worsening leukocytosis; new fever 1/17-1/18 - acute on CHF - moderate malnutrition--Palliative consulted and plan for discussion about PEG     Labs reviewed; BUN: 49 mg/dl, Ca: 8.5 mg/dl. Medications reviewed; 40 mg IV lasix/day, 5 ml senokot BID. IVF; LR @ 75 ml/hr.   Diet Order:   Diet Order            Diet NPO time specified  Diet effective now              EDUCATION NEEDS:   Not appropriate for education at this time  Skin:  Skin Assessment: Skin Integrity Issues: Skin Integrity Issues:: Stage I, Stage II, Unstageable Stage I: upper L back Stage II: L thigh, R ear (newly documented 1/14) Stage III: sacrum Unstageable: full thickness to sacrum Other: venous stasis ulcer to L thigh  Last BM:  1/16  Height:   Ht Readings from Last 1 Encounters:  09/25/19 5\' 8"  (1.727 m)    Weight:   Wt Readings from Last 1 Encounters:  10/10/19 57 kg    Ideal Body Weight:  63.6 kg  BMI:  Body mass index is 19.11 kg/m.  Estimated Nutritional Needs:   Kcal:  2000-2200 kcal  Protein:  100-115 grams  Fluid:  >/= 2 L/day     Jarome Matin, MS, RD, LDN, [facility] Inpatient Clinical Dietitian Pager # [phone] After hours/weekend pager # [phone]

## 2019-10-11 DIAGNOSIS — R531 Weakness: Secondary | ICD-10-CM

## 2019-10-11 DIAGNOSIS — Z515 Encounter for palliative care: Secondary | ICD-10-CM

## 2019-10-11 DIAGNOSIS — Z7189 Other specified counseling: Secondary | ICD-10-CM

## 2019-10-11 LAB — COMPREHENSIVE METABOLIC PANEL
ALT: 40 U/L (ref 0–44)
AST: 30 U/L (ref 15–41)
Albumin: 2.4 g/dL — ABNORMAL LOW (ref 3.5–5.0)
Alkaline Phosphatase: 83 U/L (ref 38–126)
Anion gap: 10 (ref 5–15)
BUN: 44 mg/dL — ABNORMAL HIGH (ref 8–23)
CO2: 29 mmol/L (ref 22–32)
Calcium: 8.4 mg/dL — ABNORMAL LOW (ref 8.9–10.3)
Chloride: 108 mmol/L (ref 98–111)
Creatinine, Ser: 0.6 mg/dL (ref 0.44–1.00)
GFR calc Af Amer: >60 mL/min (ref >60)
GFR calc non Af Amer: >60 mL/min (ref >60)
Glucose, Bld: 88 mg/dL (ref 70–99)
Potassium: 2.5 mmol/L — CL (ref 3.5–5.1)
Sodium: 147 mmol/L — ABNORMAL HIGH (ref 135–145)
Total Bilirubin: 1 mg/dL (ref 0.3–1.2)
Total Protein: 6.2 g/dL — ABNORMAL LOW (ref 6.5–8.1)

## 2019-10-11 LAB — GLUCOSE, CAPILLARY
Glucose-Capillary: 83 mg/dL (ref 70–99)
Glucose-Capillary: 66 mg/dL — ABNORMAL LOW (ref 70–99)
Glucose-Capillary: 105 mg/dL — ABNORMAL HIGH (ref 70–99)
Glucose-Capillary: 81 mg/dL (ref 70–99)
Glucose-Capillary: 91 mg/dL (ref 70–99)

## 2019-10-11 LAB — CBC
HCT: 28.1 % — ABNORMAL LOW (ref 36.0–46.0)
Hemoglobin: 8.2 g/dL — ABNORMAL LOW (ref 12.0–15.0)
MCH: 29 pg (ref 26.0–34.0)
MCHC: 29.2 g/dL — ABNORMAL LOW (ref 30.0–36.0)
MCV: 99.3 fL (ref 80.0–100.0)
Platelets: 300 10*3/uL (ref 150–400)
RBC: 2.83 MIL/uL — ABNORMAL LOW (ref 3.87–5.11)
RDW: 14.4 % (ref 11.5–15.5)
WBC: 11.7 10*3/uL — ABNORMAL HIGH (ref 4.0–10.5)
nRBC: 0 % (ref 0.0–0.2)

## 2019-10-11 MED ORDER — DEXTROSE IN LACTATED RINGERS 5 % IV SOLN: INTRAVENOUS | Status: AC

## 2019-10-11 MED ORDER — POTASSIUM CHLORIDE 10 MEQ/100ML IV SOLN
10.0000 meq | INTRAVENOUS | Status: AC
  Administered 2019-10-11 (×9): 10 meq via INTRAVENOUS
  Filled 2019-10-11 (×10): qty 100

## 2019-10-11 MED ORDER — LEVALBUTEROL HCL 0.63 MG/3ML IN NEBU: 0.6300 mg | INHALATION_SOLUTION | Freq: Three times a day (TID) | RESPIRATORY_TRACT | Status: DC

## 2019-10-11 MED ORDER — DEXTROSE 250 MG/ML IV SOLN
12.5000 mg | Freq: Once | INTRAVENOUS | Status: DC | PRN
  Filled 2019-10-11: qty 10

## 2019-10-11 MED ORDER — DEXTROSE 50 % IV SOLN: 12.5000 g | Freq: Once | INTRAVENOUS | Status: DC | PRN

## 2019-10-11 MED ORDER — POTASSIUM CHLORIDE 10 MEQ/50ML IV SOLN: 10.0000 meq | INTRAVENOUS | Status: DC

## 2019-10-11 MED ORDER — DEXTROSE 50 % IV SOLN
12.5000 g | Freq: Once | INTRAVENOUS | Status: AC
  Administered 2019-10-11: 12.5 g via INTRAVENOUS

## 2019-10-11 MED ORDER — LEVALBUTEROL HCL 0.63 MG/3ML IN NEBU
0.6300 mg | INHALATION_SOLUTION | Freq: Three times a day (TID) | RESPIRATORY_TRACT | Status: DC
  Administered 2019-10-11 – 2019-10-13 (×5): 0.63 mg via RESPIRATORY_TRACT
  Filled 2019-10-11 (×4): qty 3

## 2019-10-11 MED ORDER — POTASSIUM CHLORIDE 10 MEQ/100ML IV SOLN
10.0000 meq | Freq: Once | INTRAVENOUS | Status: AC
  Administered 2019-10-11: 10 meq via INTRAVENOUS

## 2019-10-11 MED ORDER — LEVALBUTEROL HCL 0.63 MG/3ML IN NEBU
INHALATION_SOLUTION | RESPIRATORY_TRACT | Status: AC
  Filled 2019-10-11: qty 3

## 2019-10-11 MED ORDER — DEXTROSE 50 % IV SOLN
INTRAVENOUS | Status: AC
  Filled 2019-10-11: qty 50

## 2019-10-11 NOTE — ED Notes (Signed)
CPT with Vest not done -Pt. had SVT run.

## 2019-10-11 NOTE — Evaluation (Addendum)
Clinical/Bedside Swallow Evaluation Patient Details  Name: Barbara Flowers MRN: [id number] Date of Birth: 03/18/1951  Today's Date: 10/11/2019 Time: SLP Start Time (ACUTE ONLY): 1702 SLP Stop Time (ACUTE ONLY): 1734 SLP Time Calculation (min) (ACUTE ONLY): 32 min  Past Medical History:  Past Medical History:  Diagnosis Date  . Anemia   . Anemia   . Anxiety disorder   . Blood transfusion without reported diagnosis   . Brain cancer [facility])    s/p sterotactic radio surgery  . Brain tumor ([place])   . Breast cancer ([place])   . Cancer ([place]) 1990   L breast  . H/O bone marrow transplant ([place])   . Hypothyroidism   . IBS (irritable bowel syndrome)   . MDD (major depressive disorder)   . Memory loss   . Obesity   . Orthostatic hypotension   . Seizure ([place])   . Seizures ([place])   . Stroke [facility]) 2005   hemorrhagic  . Stroke ([place])   . Subdural hematoma ([place])   . Upper brachial plexus paralysis syndrome   . Urinary incontinence    Past Surgical History:  Past Surgical History:  Procedure Laterality Date  . BREAST LUMPECTOMY Left    post chemotherapy and radiation  . CHOLECYSTECTOMY    . CRANIOTOMY FOR HEMISPHERECTOMY TOTAL / PARTIAL Right   . GASTRIC BYPASS  1985  . HAND SURGERY Left 2015  . HAND TENDON SURGERY Left    for brachial plexus injury  . LYMPH NODE BIOPSY     HPI:      Assessment / Plan / Recommendation Clinical Impression  Pt tested side lying toward the right, voice present but weak unless pt makes significant effort to increase strength/amplitude.  Weak reflexive and volitional cough observed with intermittent wet vocal quality that did not clear *suspect secretion aspiration.  Pt's oral cavity is clean without dried secretions.  Pt willing to accept po intake offered including ice chips, thin water, nectar thick juice, applesauce and bite of graham cracker.  Swallow is timely with laryngeal elevation noted.  No indication of aspiration with tsp water, tsp nectar juice,  applesauce nor graham cracker boluses.  She did demonstrate weak coughing immediately post swallow of ice chip *suspect she did not allow it to fully melt before swallowing* but this cough was not productive.  Cannot rule out silent aspiration and suspect some component of this given pt's h/o strokes, brain cancer, secretion aspiration.    Pt with h/o known esophageal dysmotility that will also increase her aspiration risk.  Pt has ongoing weakness, non-productive cough, prolonged hospital coarse, premorbid dysphagia, and now secretion aspiration. Functional plan may be to proceed with feeding tube if family/MD desires - allow pt po tsps of water to decrease disuse atrophy and for hygeine.     Would advise as pt medically progresses to conduct MBS go allow evaluation of oropharyngeal musculature/swallow and establish potential least restrictive pleasure diet with compensation strategies.  This would allow pt to obtain nutrition via feeding tube and still enjoy some po intake.   Spouse was not present during evaluation.   SLP Visit Diagnosis: Dysphagia, oral phase (R13.11);Dysphagia, unspecified (R13.10)(known h/o esophageal component)    Aspiration Risk  Risk for inadequate nutrition/hydration;Severe aspiration risk    Diet Recommendation (tsps of water)   Liquid Administration via: Spoon Medication Administration: Via alternative means    Other  Recommendations Oral Care Recommendations: Oral care QID   Follow up Recommendations    tbd    Frequency  and Duration min 2x/week  2 weeks       Prognosis Prognosis for Safe Diet Advancement: Fair Barriers to Reach Goals: Cognitive deficits;Severity of deficits;Time post onset      Swallow Study   General      Oral/Motor/Sensory Function Overall Oral Motor/Sensory Function: Generalized oral weakness(pt lying on her right side therefore difficulty to accurately conduct OME) Facial ROM: Within Functional Limits Facial Symmetry: Abnormal  symmetry left Lingual ROM: (tongue midline) Velum: Within Functional Limits   Ice Chips Ice chips: Impaired Presentation: Spoon Oral Phase Impairments: Reduced labial seal(improved with more trials) Pharyngeal Phase Impairments: Suspected delayed Swallow;Cough - Immediate   Thin Liquid Thin Liquid: Impaired Presentation: Straw;Spoon Pharyngeal  Phase Impairments: Cough - Delayed;Cough - Immediate Other Comments: cough immediately with straw bolus of water - not tsp amounts    Nectar Thick Nectar Thick Liquid: Impaired Presentation: Spoon;Straw Pharyngeal Phase Impairments: Cough - Delayed Other Comments: delayed cough after 2nd straw bolus of nectar liquids, but swallow clinically appeared timely   Honey Thick Honey Thick Liquid: Not tested   Puree Puree: Within functional limits Presentation: Spoon Other Comments: few boluses of applesauce, timely swallow   Solid     Solid: Impaired Presentation: Spoon Oral Phase Impairments: Impaired mastication;Reduced lingual movement/coordination Oral Phase Functional Implications: Right lateral sulci pocketing Other Comments: minimal right lateral sulci pocketing      [name] 10/11/2019,6:19 PM  Kathleen Lime, MS [place] Office [phone]

## 2019-10-11 NOTE — Progress Notes (Signed)
HYDROTHERAPY TREATMENT      10/11/19 1400  Subjective Assessment  Subjective "are you done yet?" "are you about to head out?"  Patient and Family Stated Goals none stated  Date of Onset  (present on admission)  Evaluation and Treatment  Evaluation and Treatment Procedures Explained to Patient/Family Yes  Evaluation and Treatment Procedures agreed to (husband present at start of session)  Pressure Injury 09/17/19 Sacrum Medial;Left Unstageable - Full thickness tissue loss in which the base of the injury is covered by slough (yellow, tan, gray, green or brown) and/or eschar (tan, brown or black) in the wound bed. wound w eschar  Date First Assessed/Time First Assessed: 09/17/19 0245   Location: Sacrum  Location Orientation: Medial;Left  Staging: Unstageable - Full thickness tissue loss in which the base of the injury is covered by slough (yellow, tan, gray, green or brown) an...  Dressing Type Foam - Lift dressing to assess site every shift;Gauze  (Santyl)  Dressing Changed  Dressing Change Frequency Daily  State of Healing Non-healing  Site / Wound Assessment Red;Pink;Yellow;Brown  % Wound base Red or Granulating 30%  % Wound base Yellow/Fibrinous Exudate 60%  % Wound base Black/Eschar 10%  % Wound base Other/Granulation Tissue (Comment)  (tissue 12-3:00, necrotic?, pt c/o pain so didn't debride)  Peri-wound Assessment Black;Pink;Erythema (non-blanchable)  Undermining (cm)  (12:00-3:00)  Margins Unattached edges (unapproximated)  Drainage Amount Minimal  Drainage Description Serosanguineous;Odor  Treatment Debridement (Selective);Hydrotherapy (Pulse lavage);Packing (Saline gauze) (Enzymatic debridement)  Hydrotherapy  Pulsed lavage therapy - wound location sacrum  Pulsed Lavage with Suction (psi) 8 psi  Pulsed Lavage with Suction - Normal Saline Used 1000 mL  Pulsed Lavage Tip Tip with splash shield  Selective Debridement  Selective Debridement - Location sacrum  Selective  Debridement - Tools Used Forceps;Scissors  Selective Debridement - Tissue Removed yellow slough  Wound Therapy - Assess/Plan/Recommendations  Wound Therapy - Clinical Statement 69 yo female admitted to hospital with weakness. Hx of CVA with L residual weakness, dementia, Parkinsons, sacral wounds, contractures. WC bound PTA.   Wound Therapy - Functional Problem List CVA with L residual weakness, contractures, Parkinson's  Factors Delaying/Impairing Wound Healing Incontinence;Immobility  Hydrotherapy Plan Debridement;Dressing change;Patient/family education;Pulsatile lavage with suction  Wound Therapy - Frequency 6X / week  Wound Therapy - Current Recommendations WOC nurse;Case manager/social work  Wound Therapy - Follow Up Recommendations Wound Care Center;Skilled nursing facility  Wound Plan Palliative following. Questionable Prognosis. Will plan to continue hydro, dressing changes. Will assess and update recommendations as needed.   Wound Therapy Goals - Improve the function of patient's integumentary system by progressing the wound(s) through the phases of wound healing by:  Decrease Necrotic Tissue to 30  Decrease Necrotic Tissue - Progress Progressing toward goal  Increase Granulation Tissue to 70  Increase Granulation Tissue - Progress Progressing toward goal  Goals/treatment plan/discharge plan were made with and agreed upon by patient/family No, Patient unable to participate in goals/treatment/discharge plan and family unavailable (husband stepped out of room)  Time For Goal Achievement 2 weeks  Wound Therapy - Potential for Goals Poor    Doreatha Massed, PT Acute Rehabilitation

## 2019-10-11 NOTE — Progress Notes (Signed)
Daily Progress Note   Patient Name: Barbara Flowers       Date: 10/11/2019 DOB: 1951-01-29  Age: 69 y.o. MRN#: [id number] Attending Physician: [name], MD Primary Care Physician: [name], NP Admit Date: 09/16/2019  Reason for Consultation/Follow-up: Establishing goals of care  Subjective: Barbara Flowers awakens when her name is called.  She raises her eyebrows and nods her head appropriately in yes/no movements.  Other than that, does not verbalize.  Husband at bedside.  Family meeting took place. See below.  Length of Stay: 25  Current Medications: Scheduled Meds:  . albuterol  2.5 mg Nebulization QID  . chlorhexidine  15 mL Mouth Rinse BID  . Chlorhexidine Gluconate Cloth  6 each Topical Daily  . collagenase   Topical Daily  . donepezil  10 mg Per Tube QHS  . feeding supplement (PRO-STAT SUGAR FREE 64)  30 mL Per Tube Daily  . furosemide  40 mg Intravenous Daily  . Gerhardt's butt cream   Topical BID  . heparin  5,000 Units Subcutaneous Q8H  . mouth rinse  15 mL Mouth Rinse q12n4p  . memantine  10 mg Per Tube BID  . mupirocin ointment  1 application Nasal BID  . sennosides  5 mL Per Tube BID  . sodium chloride HYPERTONIC  2 mL Nebulization BID    Continuous Infusions: . sodium chloride Stopped (10/10/19 0257)  . ampicillin-sulbactam (UNASYN) IV Stopped (10/11/19 0450)  . feeding supplement (OSMOLITE 1.2 CAL) Stopped (10/09/19 0200)  . potassium chloride 10 mEq (10/11/19 1030)  . vancomycin 1,000 mg (10/11/19 1032)    PRN Meds: sodium chloride, acetaminophen (TYLENOL) oral liquid 160 mg/5 mL, albuterol, docusate, labetalol, sodium chloride flush  Physical Exam         Patient awakens when her name is called. Appears with generalized weakness Appears with  chronic illness Regular work of breathing Awake and some but does not verbalize Irregularly irregular Monitor noted Contractures  Vital Signs: BP (!) 150/58   Pulse 64   Temp (!) 97.1 F (36.2 C) (Axillary)   Resp 20   Ht _0  (1.727 m)   Wt 57 kg   SpO2 99%   BMI 19.11 kg/m  SpO2: SpO2: 99 % O2 Device: O2 Device: Room Air O2 Flow Rate: O2 Flow Rate (L/min): 1 L/min  Intake/output summary:   Intake/Output Summary (Last 24 hours) at 10/11/2019 1135 Last data filed at 10/11/2019 1000 Gross per 24 hour  Intake 2361.24 ml  Output 1475 ml  Net 886.24 ml   LBM: Last BM Date: 10/07/19 Baseline Weight: Weight: 68 kg Most recent weight: Weight: 57 kg       Palliative Assessment/Data:      Patient Active Problem List   Diagnosis Date Noted  . Failure to thrive (0-17)   . Acute hypoxemic respiratory failure ([place])   . Pleural effusion   . Acute systolic heart failure ([place])   . Malnutrition of moderate degree 09/24/2019  . Pressure injury of skin 09/17/2019  . CAP (community acquired pneumonia) 09/16/2019  . Pleural effusion on left 09/16/2019  . Acute prerenal azotemia 09/16/2019  . Hypernatremia 09/16/2019  . Diarrhea 05/24/2017  . Hypokalemia 05/24/2017  . TIA (transient ischemic attack) 08/31/2015  . History of CVA with residual deficit left side 08/31/2015  . Hypothyroidism 08/31/2015  . OSA on CPAP 08/31/2015  . Dementia ([place]) 08/31/2015  . Chronic orthostatic hypotension 08/31/2015  . OAB (overactive bladder) 08/31/2015  . 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  . Peripheral neuropathy 08/31/2015  . Avascular necrosis of right femoral head ([place]) 08/31/2015  . Left carotid bruit 08/31/2015  . Anemia 08/31/2015    Palliative Care Assessment & Plan   Patient Profile:    Assessment: Barbara Flowers is 69 years old.  She has past medical history significant for breast cancer and stroke.  She has left-sided weakness and is a nursing home resident.  She has also  been using a wheelchair.  She presented with cough congestion worsening shortness of breath and was admitted to [facility] in [place], [place] with extensive left-sided infiltrate and left-sided effusion with hypoxic respiratory failure.  Patient remains admitted to hospital medicine service and has also had critical care medicine following along.  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  Palliative medicine has been consulted for overall goals of care discussions.  Recommendations/Plan:  Family meeting: I met with Barbara Flowers's husband Barbara Flowers after he arrived at her bedside this morning.  Briefly reviewed about scope of palliative service involvement in this hospitalization as an extra layer of support, to be able to better understand patient's and family's goals wishes and values to be able to provide goal concordant care.  Patient's husband is her best advocate and thoroughly describes her current condition, past medical conditions as well as her baseline health.  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  Over the course of the past several years and past several hospitalizations, she has been told that she has a limited prognosis and limited life expectancy.  Brief life review also performed.  Patient worked as a Pharmacist, hospital.  There have been married for about 50 years.  Patient has a birthday coming up in a couple of months.  It has been the patient's and her husband's goal to be able to visit [place] for their anniversary.   Barbara Flowers and I talked about artificial nutrition and hydration, PEG tube placement in detail.  We also talked about bedside  speech evaluation that is due to be completed soon.  Plan: Full code/full scope care. Continue any and  all available life maintaining/life-prolonging therapies. PEG tube if necessary. Continue current mode of care, continue to make efforts towards any degree of stabilization/recovery.    Goals of Care and Additional Recommendations:  Limitations on Scope of Treatment: Full Scope Treatment  Code Status:    Code Status Orders  (From admission, onward)         Start     Ordered   09/16/19 1737  Full code  Continuous     09/16/19 1740        Code Status History    Date Active Date Inactive Code Status Order ID Comments User Context   05/24/2017 2004 05/28/2017 2035 Full Code [id number]  Etta Quill, DO ED   08/31/2015 1641 09/02/2015 2016 Full Code [id number]  Samella Parr, NP Inpatient   Advance Care Planning Activity    Advance Directive Documentation     Most Recent Value  Type of Advance Directive  Healthcare Power of [place], Living will  Pre-existing out of facility DNR order (yellow form or pink MOST form)  --  "MOST" Form in Place?  --       Prognosis:   Unable to determine  Discharge Planning:  To Be Determined  Care plan was discussed with patient's husband, also discussed with bedside RN as well as SLP colleagues.   Thank you for allowing the Palliative Medicine Team to assist in the care of this patient.   Time In: 11 Time Out: 11.35 Total Time 35 Prolonged Time Billed  no       Greater than 50%  of this time was spent counseling and coordinating care related to the above assessment and plan.  Loistine Chance, MD  Please contact Palliative Medicine Team phone at [phone] for questions and concerns.

## 2019-10-11 NOTE — Progress Notes (Signed)
1915 Pt hr increased to 130-140bpm appears to be NST, regular.  BP stable at 110.  Pt resting, will monitor closely.  HR flipping back and forth b/t NST and brady 60bpm (which has been baseline) Pt hr increased to 160-170 appears to be SVT.  BP 109/60.  Labatelol 5m given prn.  Midlevel [employer] paged. Agricultural consultant, two Physiological scientist in room.  NS bolus 250 given per rapid response.

## 2019-10-11 NOTE — Progress Notes (Signed)
PROGRESS NOTE    Barbara Flowers  [id number] DOB: 1950-09-24 DOA: 09/16/2019 PCP: [name], NP    Brief Narrative:  Patient is here for 24 days.  Previous events and treatments were based upon previous physicians notes and records as below. 69 year old female with history of breast cancer, dementia, history of CVA with left-sided hemiplegia, nursing home resident, wheelchair-bound for last 3 years presented to the emergency room on 09/16/2019 with 1 week of cough, congestion, worsening shortness of breath.  Initially patient was found with extensive left-sided infiltrate and left-sided effusion with hypoxemia corrected with nonrebreather and admitted to ICU.  Events as below: 12/28 thora centesis 1/2 thora centesis 1/3 intubated and transferred to ICU 1/8 started vanc for MRSA sputum 1/11 not weaning well 1/13 more awake weaning x 2 hrs->spoke w/ family still want full support 1/14 acceptable rapid shallow. Following commands, extubated.   1/15 looks better ready for floor. PCCM sign off.  1/18 Rapid response overnight, suspected ongoing aspiration, copious thick secretions, poor mentation. PCCM re-engaged , back on vancomycin and Unasyn.  Antibiotics: Rocephin 12/26 >> (14 day course) 1/8 Azithromycin 12/27 > 28 Vancomycin 12/26, 1/7-1/12, 1/18 > Unasyn 1/18 >   Diagnostic data: Covid 19 PCR 12/26 >> neg RVP 12/26 >>neg  Procalcitonin 12/26 >> 2.01, down trended to 0.4 with abx BCx2 12/26 >>Pan sensitive Proteus Mirabilis UC 12/26 >> negative Urine Pneum Ag 12/26 >> positive Respiratory culture 1/6>> MRSA  Assessment & Plan:   Active Problems:   Dementia ([place])   CAP (community acquired pneumonia)   Pleural effusion on left   Acute prerenal azotemia   Hypernatremia   Pressure injury of skin   Malnutrition of moderate degree   Acute hypoxemic respiratory failure (HCC)   Pleural effusion   Acute systolic heart failure (HCC)   Failure to thrive  (0-17)  Acute hypoxemic and hypercarbic respiratory failure in the setting of MRSA pneumonia, poor cough clearance and extreme debility: Thoracentesis x2, status post intubation and extubated on 1/14 Continue aggressive bronchodilator therapy, chest physiotherapy and deep breathing exercises if she can do it. Suspected new aspiration event on 1/18, currently on vancomycin and Unasyn that we will continue for 1 week. High risk of aspiration events. Remains on minimal oxygen, continue to keep saturations more than 89.  Acute metabolic encephalopathy with history of underlying dementia: CT head negative.  More responsive today. Speech evaluation.  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 fib with RVR: EF 25 to 30%.  Currently clinically euvolemic.  On maintenance IV fluids, discontinue. On IV Lasix along with potassium supplements that we will continue.  Hypokalemia: Severe and persistent.  Aggressively replaced.  Recheck levels in the morning.  Check magnesium and phosphorus.  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  Seen by surgery.  Bedside debridement.  PT doing hydrotherapy.  Nutrition: Patient has been n.p.o.  Multiple attempts of NG tube placement failed 1/19 as per reports.  She is more alert and communicating today.  Speech to evaluate.  May resume on dysphagia diet with supervision. Fluid: Discontinue maintenance Ringer lactate. Goal of care: As per palliative conversation, they are meeting with patient's husband at the bedside.  They have desired full code and aggressive treatment so far.  Patient is very high risk of aspiration and respiratory compromise. If patient unable to eat and swallow safely with a speech evaluation and family wanting aggressive treatment, will consult for  PEG tube placement.  If  needing reintubation, she will need tracheostomy.  DVT prophylaxis: Heparin subcu Code Status: Full  code Family Communication: None.  Will come back to talk to family at the bedside. Disposition Plan: Unknown.  Back to a skilled nursing facility when medically stable.   Consultants:   PCCM,  Surgery,  Palliative medicine  Procedures:   Bedside wound debridement, 1/19  Antimicrobials:   See above.  Currently on Unasyn and vancomycin.   Subjective: Patient seen and examined.  No overnight events.  Now mostly on room air.  She was able to talk, communicate with clear voice.  She was not sure where she was.  Afebrile overnight.  Objective: Vitals:   10/11/19 0600 10/11/19 0700 10/11/19 0800 10/11/19 0847  BP: (!) 133/51 (!) 141/51 (!) 109/41   Pulse: (!) 54 (!) 56 (!) 51   Resp: 12 14 13    Temp:   (!) 97.1 F (36.2 C)   TempSrc:   Axillary   SpO2: 100% 100% 100% 100%  Weight:      Height:        Intake/Output Summary (Last 24 hours) at 10/11/2019 0849 Last data filed at 10/11/2019 0800 Gross per 24 hour  Intake 2206.04 ml  Output 1475 ml  Net 731.04 ml   Filed Weights   10/08/19 0500 10/09/19 0332 10/10/19 0500  Weight: 56.7 kg 56.2 kg 57 kg    Examination:  General exam: Appears calm and comfortable, on 1 to 2 L of oxygen not in any distress.  Chronically sick looking. Respiratory system: Clear to auscultation.  Mostly with normal respiratory effort.  No added sounds. Cardiovascular system: S1 & S2 heard, irregularly irregular.  No murmurs. Gastrointestinal system: Abdomen is nondistended, soft and nontender. No organomegaly or masses felt. Normal bowel sounds heard. Central nervous system: Alert but not oriented.   Extremities: Patient was able to use right hand and follow commands.  She did not move both her lower extremities.  Left upper extremity is contracted, able to squeeze fingers 2/5 Psychiatry: Judgement and insight appear compromised.  Flat affect. Stage IV sacral decubitus with dressing on    Data Reviewed: I have personally reviewed following  labs and imaging studies  CBC: Recent Labs  Lab 10/06/19 0609 10/08/19 0416 10/09/19 0137 10/10/19 0500 10/11/19 0500  WBC 11.3* 14.0* 19.4* 17.0* 11.7*  HGB 8.5* 8.3* 9.0* 8.4* 8.2*  HCT 28.7* 27.4* 30.1* 28.7* 28.1*  MCV 99.7 96.5 97.7 98.6 99.3  PLT 376 390 443* 383 [id number]   Basic Metabolic Panel: Recent Labs  Lab 10/06/19 0609 10/06/19 0609 10/07/19 0417 10/08/19 0416 10/09/19 0137 10/09/19 1900 10/10/19 0500 10/11/19 0500  NA 137   < > 134* 139 141  --  144 147*  K 3.5   < > 4.4 4.4 4.7  --  3.7 2.5*  CL 97*   < > 97* 100 102  --  107 108  CO2 29   < > 26 29 29   --  26 29  GLUCOSE 130*   < > 132* 140* 117*  --  96 88  BUN 44*   < > 47* 47* 44*  --  49* 44*  CREATININE 0.46   < > 0.57 0.53 0.59  --  0.65 0.60  CALCIUM 8.5*   < > 8.5* 8.7* 9.1  --  8.5* 8.4*  MG 2.1  --  2.3  --   --  2.3  --   --    < > = values in this  interval not displayed.   GFR: Estimated Creatinine Clearance: 59.7 mL/min (by C-G formula based on SCr of 0.6 mg/dL). Liver Function Tests: Recent Labs  Lab 10/08/19 0416 10/09/19 0137 10/10/19 0500 10/11/19 0500  AST 50* 63* 37 30  ALT 53* 62* 48* 40  ALKPHOS 89 96 98 83  BILITOT 0.7 1.1 0.8 1.0  PROT 6.8 6.9 6.6 6.2*  ALBUMIN 2.5* 2.6* 2.5* 2.4*   No results for input(s): LIPASE, AMYLASE in the last 168 hours. Recent Labs  Lab 10/09/19 1013  AMMONIA 16   Coagulation Profile: No results for input(s): INR, PROTIME in the last 168 hours. Cardiac Enzymes: No results for input(s): CKTOTAL, CKMB, CKMBINDEX, TROPONINI in the last 168 hours. BNP (last 3 results) No results for input(s): PROBNP in the last 8760 hours. HbA1C: No results for input(s): HGBA1C in the last 72 hours. CBG: Recent Labs  Lab 10/06/19 0323 10/06/19 0929 10/07/19 0342 10/10/19 1545 10/11/19 0428  GLUCAP 121* 138* 142* 75 83   Lipid Profile: No results for input(s): CHOL, HDL, LDLCALC, TRIG, CHOLHDL, LDLDIRECT in the last 72 hours. Thyroid Function  Tests: No results for input(s): TSH, T4TOTAL, FREET4, T3FREE, THYROIDAB in the last 72 hours. Anemia Panel: No results for input(s): VITAMINB12, FOLATE, FERRITIN, TIBC, IRON, RETICCTPCT in the last 72 hours. Sepsis Labs: No results for input(s): PROCALCITON, LATICACIDVEN in the last 168 hours.  Recent Results (from the past 240 hour(s))  MRSA PCR Screening     Status: Abnormal   Collection Time: 10/09/19  9:21 AM   Specimen: Nasal Mucosa; Nasopharyngeal  Result Value Ref Range Status   MRSA by PCR POSITIVE (A) NEGATIVE Final    Comment:        The GeneXpert MRSA Assay (FDA approved for NASAL specimens only), is one component of a comprehensive MRSA colonization surveillance program. It is not intended to diagnose MRSA infection nor to guide or monitor treatment for MRSA infections. RESULT CALLED TO, READ BACK BY AND VERIFIED WITH: ROMINES,H. RN @1531  10/09/19 BILLINGSLEY,L Performed at [facility], [place] [address] [place], [place] [address]          Radiology Studies: DG CHEST PORT 1 VIEW  Result Date: 10/10/2019 CLINICAL DATA:  Hypoxia. EXAM: PORTABLE CHEST 1 VIEW COMPARISON:  10/09/2019 FINDINGS: The heart size is normal. NG tube was removed. Atherosclerotic changes are noted at the aortic arch. Asymmetric left-sided airspace disease is again seen. Left pleural effusion is suspected. The right lung remains clear. IMPRESSION: 1. Stable asymmetric left-sided airspace disease and probable effusion. Findings are consistent with pneumonia. 2. Atherosclerosis of the thoracic aorta. Electronically Signed   By: San Morelle M.D.   On: 10/10/2019 06:15   DG CHEST PORT 1 VIEW  Result Date: 10/09/2019 CLINICAL DATA:  Follow-up atelectasis involving the LEFT lung related to mucous plugging. EXAM: PORTABLE CHEST 1 VIEW 12:20 p.m.: COMPARISON:  Portable chest x-ray earlier same day 12:35 a.m. and previously. FINDINGS: Since the examination earlier same day, the  nasogastric tube has withdrawn such that its tip is now in the distal esophagus. The tube should be readvanced. Interval improved aeration in the LEFT lung since earlier today, though consolidation persists in the LEFT LOWER LOBE. There are patchy airspace opacities in the LEFT UPPER LOBE. RIGHT lung remains clear. Dense calcifications in the LEFT breast are likely benign and dystrophic in origin. IMPRESSION: 1. The nasogastric tube has withdrawn such that its tip is now in the distal esophagus. The tube should be readvanced. 2.  Improved aeration in the LEFT lung since earlier today, though dense atelectasis and/or pneumonia persists in the LEFT LOWER LOBE and there is patchy pneumonia involving the LEFT UPPER LOBE. These results will be called to the ordering clinician or representative by the Radiologist Assistant, and communication documented in the PACS or zVision Dashboard. Electronically Signed   By: Evangeline Dakin M.D.   On: 10/09/2019 12:51        Scheduled Meds: . albuterol  2.5 mg Nebulization QID  . chlorhexidine  15 mL Mouth Rinse BID  . Chlorhexidine Gluconate Cloth  6 each Topical Daily  . collagenase   Topical Daily  . donepezil  10 mg Per Tube QHS  . feeding supplement (PRO-STAT SUGAR FREE 64)  30 mL Per Tube Daily  . furosemide  40 mg Intravenous Daily  . Gerhardt's butt cream   Topical BID  . heparin  5,000 Units Subcutaneous Q8H  . mouth rinse  15 mL Mouth Rinse q12n4p  . memantine  10 mg Per Tube BID  . mupirocin ointment  1 application Nasal BID  . sennosides  5 mL Per Tube BID  . sodium chloride HYPERTONIC  2 mL Nebulization BID   Continuous Infusions: . sodium chloride Stopped (10/10/19 0257)  . ampicillin-sulbactam (UNASYN) IV Stopped (10/11/19 0450)  . feeding supplement (OSMOLITE 1.2 CAL) Stopped (10/09/19 0200)  . lactated ringers 75 mL/hr at 10/11/19 0800  . potassium chloride 10 mEq (10/11/19 0820)  . vancomycin Stopped (10/10/19 1241)     LOS: 25 days     Time spent: 30 minutes     [name], MD [facility] Pager [phone]

## 2019-10-11 NOTE — Progress Notes (Signed)
[facility] Progress Note Patient Name: Barbara Flowers DOB: 06-29-1951 MRN: [id number]   Date of Service  10/11/2019  HPI/Events of Note  K+ 2.5  eICU Interventions  KCL 10 meq Q 1 hour x 10 doses        [name] 10/11/2019, 6:44 AM

## 2019-10-12 ENCOUNTER — Inpatient Hospital Stay (HOSPITAL_COMMUNITY): Payer: Medicare Other

## 2019-10-12 LAB — CBC WITH DIFFERENTIAL/PLATELET
Abs Immature Granulocytes: 0.19 10*3/uL — ABNORMAL HIGH (ref 0.00–0.07)
Basophils Absolute: 0 10*3/uL (ref 0.0–0.1)
Basophils Relative: 0 %
Eosinophils Absolute: 0.1 10*3/uL (ref 0.0–0.5)
Eosinophils Relative: 1 %
HCT: 27.5 % — ABNORMAL LOW (ref 36.0–46.0)
Hemoglobin: 8.2 g/dL — ABNORMAL LOW (ref 12.0–15.0)
Immature Granulocytes: 2 %
Lymphocytes Relative: 22 %
Lymphs Abs: 2.2 10*3/uL (ref 0.7–4.0)
MCH: 29.2 pg (ref 26.0–34.0)
MCHC: 29.8 g/dL — ABNORMAL LOW (ref 30.0–36.0)
MCV: 97.9 fL (ref 80.0–100.0)
Monocytes Absolute: 0.8 10*3/uL (ref 0.1–1.0)
Monocytes Relative: 8 %
Neutro Abs: 6.6 10*3/uL (ref 1.7–7.7)
Neutrophils Relative %: 67 %
Platelets: 314 10*3/uL (ref 150–400)
RBC: 2.81 MIL/uL — ABNORMAL LOW (ref 3.87–5.11)
RDW: 14.3 % (ref 11.5–15.5)
WBC: 9.9 10*3/uL (ref 4.0–10.5)
nRBC: 0.2 % (ref 0.0–0.2)

## 2019-10-12 LAB — COMPREHENSIVE METABOLIC PANEL
ALT: 31 U/L (ref 0–44)
AST: 27 U/L (ref 15–41)
Albumin: 2.3 g/dL — ABNORMAL LOW (ref 3.5–5.0)
Alkaline Phosphatase: 75 U/L (ref 38–126)
Anion gap: 10 (ref 5–15)
BUN: 30 mg/dL — ABNORMAL HIGH (ref 8–23)
CO2: 27 mmol/L (ref 22–32)
Calcium: 8.2 mg/dL — ABNORMAL LOW (ref 8.9–10.3)
Chloride: 107 mmol/L (ref 98–111)
Creatinine, Ser: 0.51 mg/dL (ref 0.44–1.00)
GFR calc Af Amer: >60 mL/min (ref >60)
GFR calc non Af Amer: >60 mL/min (ref >60)
Glucose, Bld: 101 mg/dL — ABNORMAL HIGH (ref 70–99)
Potassium: 4.3 mmol/L (ref 3.5–5.1)
Sodium: 144 mmol/L (ref 135–145)
Total Bilirubin: 1 mg/dL (ref 0.3–1.2)
Total Protein: 5.8 g/dL — ABNORMAL LOW (ref 6.5–8.1)

## 2019-10-12 LAB — GLUCOSE, CAPILLARY
Glucose-Capillary: 93 mg/dL (ref 70–99)
Glucose-Capillary: 88 mg/dL (ref 70–99)
Glucose-Capillary: 88 mg/dL (ref 70–99)
Glucose-Capillary: 97 mg/dL (ref 70–99)
Glucose-Capillary: 108 mg/dL — ABNORMAL HIGH (ref 70–99)
Glucose-Capillary: 135 mg/dL — ABNORMAL HIGH (ref 70–99)

## 2019-10-12 LAB — MAGNESIUM: Magnesium: 1.9 mg/dL (ref 1.7–2.4)

## 2019-10-12 LAB — PHOSPHORUS: Phosphorus: 2.3 mg/dL — ABNORMAL LOW (ref 2.5–4.6)

## 2019-10-12 MED ORDER — DEXTROSE IN LACTATED RINGERS 5 % IV SOLN: INTRAVENOUS | Status: AC

## 2019-10-12 MED ORDER — MAGNESIUM SULFATE 2 GM/50ML IV SOLN
2.0000 g | Freq: Once | INTRAVENOUS | Status: AC
  Administered 2019-10-12: 2 g via INTRAVENOUS
  Filled 2019-10-12: qty 50

## 2019-10-12 MED ORDER — POTASSIUM PHOSPHATES 15 MMOLE/5ML IV SOLN
20.0000 mmol | Freq: Once | INTRAVENOUS | Status: AC
  Administered 2019-10-12: 20 mmol via INTRAVENOUS
  Filled 2019-10-12: qty 6.67

## 2019-10-12 MED ORDER — LIP MEDEX EX OINT
TOPICAL_OINTMENT | CUTANEOUS | Status: AC
  Administered 2019-10-12: 1
  Filled 2019-10-12: qty 7

## 2019-10-12 MED ORDER — POTASSIUM PHOSPHATES 15 MMOLE/5ML IV SOLN
20.0000 mmol | Freq: Once | INTRAVENOUS | Status: DC
  Filled 2019-10-12: qty 6.67

## 2019-10-12 NOTE — Progress Notes (Signed)
Physical Therapy Hydrotherapy Treatment Note    10/12/19 1600  Subjective Assessment  Subjective "what does that do?"  Patient and Family Stated Goals none stated  Date of Onset  (present on admission)  Evaluation and Treatment  Evaluation and Treatment Procedures Explained to Patient/Family Yes  Evaluation and Treatment Procedures agreed to (husband present at start of session)  Pressure Injury 09/17/19 Sacrum Medial;Left Unstageable - Full thickness tissue loss in which the base of the injury is covered by slough (yellow, tan, gray, green or brown) and/or eschar (tan, brown or black) in the wound bed. wound w eschar  Date First Assessed/Time First Assessed: 09/17/19 0245   Location: Sacrum  Location Orientation: Medial;Left  Staging: Unstageable - Full thickness tissue loss in which the base of the injury is covered by slough (yellow, tan, gray, green or brown) an...  Dressing Type Foam - Lift dressing to assess site every shift;Gauze (Comment) (Santyl)  Dressing Changed  Dressing Change Frequency Daily  State of Healing Non-healing  Site / Wound Assessment Red;Pink;Yellow;Brown  % Wound base Red or Granulating 30%  % Wound base Yellow/Fibrinous Exudate 60%  % Wound base Black/Eschar 10%  % Wound base Other/Granulation Tissue (Comment)  (tissue 12-3:00, necrotic? or possibly becoming necrotic)  Peri-wound Assessment Black;Pink;Erythema (non-blanchable);Maceration  Margins Unattached edges (unapproximated)  Drainage Amount Moderate  Drainage Description Serosanguineous;Odor  Treatment Debridement (Selective);Hydrotherapy (Pulse lavage);Packing (Saline gauze) (enzymatic debridement)  Hydrotherapy  Pulsed lavage therapy - wound location sacrum  Pulsed Lavage with Suction (psi) 8 psi  Pulsed Lavage with Suction - Normal Saline Used 1000 mL  Pulsed Lavage Tip Tip with splash shield  Selective Debridement  Selective Debridement - Location sacrum  Selective Debridement - Tools Used  Forceps;Scissors  Selective Debridement - Tissue Removed yellow slough  Wound Therapy - Assess/Plan/Recommendations  Wound Therapy - Clinical Statement 69 yo female admitted to hospital with weakness. Hx of CVA with L residual weakness, dementia, Parkinsons, sacral wounds, contractures. WC bound PTA.   Wound Therapy - Functional Problem List CVA with L residual weakness, contractures, Parkinson's  Factors Delaying/Impairing Wound Healing Incontinence;Immobility  Hydrotherapy Plan Debridement;Dressing change;Patient/family education;Pulsatile lavage with suction  Wound Therapy - Frequency 6X / week  Wound Therapy - Current Recommendations WOC nurse;Case manager/social work  Wound Therapy - Follow Up Recommendations Wound Care Center;Skilled nursing facility  Wound Plan Palliative following. Questionable Prognosis. Will plan to continue hydro, dressing changes. Will assess and update recommendations as needed.   Wound Therapy Goals - Improve the function of patient's integumentary system by progressing the wound(s) through the phases of wound healing by:  Decrease Necrotic Tissue to 30  Decrease Necrotic Tissue - Progress Progressing toward goal  Increase Granulation Tissue to 70  Increase Granulation Tissue - Progress Progressing toward goal  Goals/treatment plan/discharge plan were made with and agreed upon by patient/family No, Patient unable to participate in goals/treatment/discharge plan and family unavailable (husband stepped out of room)  Time For Goal Achievement 2 weeks  Wound Therapy - Potential for Goals Poor   Time: [id number] - [place], DPT [place] Office: [phone]

## 2019-10-12 NOTE — Progress Notes (Signed)
PROGRESS NOTE    Barbara Flowers  [id number] DOB: 05-Jun-1951 DOA: 09/16/2019 PCP: [name], NP    Brief Narrative:  Patient is here for prolonged period. Previous events and treatments were based upon previous physicians notes and records as below. 69 year old female with history of breast cancer, dementia, history of CVA with left-sided hemiplegia, nursing home resident, wheelchair-bound for last 3 years presented to the emergency room on 09/16/2019 with 1 week of cough, congestion, worsening shortness of breath.  Initially patient was found with extensive left-sided infiltrate and left-sided effusion with hypoxemia corrected with nonrebreather and admitted to ICU.  Events as below: 12/28 thoracentesis 1/2 thora centesis 1/3 intubated and transferred to ICU 1/8 started vanc for MRSA sputum 1/11 not weaning well 1/13 more awake weaning x 2 hrs->spoke w/ family still want full support 1/14 acceptable rapid shallow. Following commands, extubated.   1/15 looks better ready for floor. PCCM sign off.  1/18 Rapid response overnight, suspected ongoing aspiration, copious thick secretions, poor mentation. PCCM re-engaged , back on vancomycin and Unasyn.  Antibiotics: Rocephin 12/26 >> (14 day course) 1/8 Azithromycin 12/27 > 28 Vancomycin 12/26, 1/7-1/12, 1/18 > Unasyn 1/18 >   Diagnostic data: Covid 19 PCR 12/26 >> neg RVP 12/26 >>neg  Procalcitonin 12/26 >> 2.01, down trended to 0.4 with abx BCx2 12/26 >>Pan sensitive Proteus Mirabilis UC 12/26 >> negative Urine Pneum Ag 12/26 >> positive Respiratory culture 1/6>> MRSA  Assessment & Plan:   Active Problems:   Dementia ([place])   CAP (community acquired pneumonia)   Pleural effusion on left   Acute prerenal azotemia   Hypernatremia   Pressure injury of skin   Malnutrition of moderate degree   Acute hypoxemic respiratory failure (HCC)   Pleural effusion   Acute systolic heart failure (HCC)   Failure to thrive  (0-17)   Palliative care by specialist   Goals of care, counseling/discussion   Generalized weakness  Acute hypoxemic and hypercarbic respiratory failure in the setting of MRSA pneumonia, poor cough clearance and extreme debility: Thoracentesis x2, status post intubation and extubated on 1/14 Continue aggressive bronchodilator therapy, chest physiotherapy and deep breathing exercises if she can do it. Suspected new aspiration event on 1/18, currently on vancomycin and Unasyn that we will continue for 1 week. High risk of aspiration events. Remains on minimal oxygen, continue to keep saturations more than 89.  Acute metabolic encephalopathy with history of underlying dementia: CT head negative.  More responsive today.  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 fib with RVR: EF 25 to 30%.  Currently clinically euvolemic.  Occasionally on maintenance dextrose. On IV Lasix along with potassium supplements that we will continue.  Hypokalemia: Severe and persistent.  Aggressively replaced.  Magnesium phosphorus normal.  Continue close monitoring.  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  Seen by surgery.  Bedside debridement.  PT doing hydrotherapy.  Nutrition: Patient has been n.p.o.  Multiple attempts of NG tube placement failed 1/19. Evaluated by speech therapy, unable to safely swallow.  Family agreed for PEG tube placement.  Interventional radiology consulted.  Discussed with interventional radiology, patient has complex abdominal surgeries.  They are evaluating.  Fluid: Intermittent dextrose infusion along with Lasix.   Goal of care: Family desired full code including tracheostomy if needed, PEG tube placement.    DVT prophylaxis: Heparin subcu Code Status: Full code Family Communication: None.  Husband on 1/20.  Will come back to talk to him. Disposition  Plan: Unknown.  Stepdown unit.  Probably home with  family after clinical stabilization.   Consultants:   PCCM,  Surgery,  Palliative medicine  Procedures:   Bedside wound debridement, 1/19  Antimicrobials:   See above.  Currently on Unasyn and vancomycin.   Subjective: Seen and examined.  Episode of hypoglycemia and currently on 5% dextrose.  Unable to eat. Had episodes of SVT and currently on sinus rhythm. Patient herself is awake, responds to questions, denies any complaints.  Objective: Vitals:   10/12/19 0300 10/12/19 0400 10/12/19 0500 10/12/19 0800  BP: (!) 124/51 (!) 135/38 (!) 131/46   Pulse: (!) 58 62 63   Resp: 16 13 15    Temp:  (!) 97.5 F (36.4 C)  98.3 F (36.8 C)  TempSrc:  Axillary  Axillary  SpO2: 99% 100% 100%   Weight:  57.8 kg    Height:        Intake/Output Summary (Last 24 hours) at 10/12/2019 1049 Last data filed at 10/12/2019 0500 Gross per 24 hour  Intake 1822.63 ml  Output 2125 ml  Net -302.37 ml   Filed Weights   10/09/19 0332 10/10/19 0500 10/12/19 0400  Weight: 56.2 kg 57 kg 57.8 kg    Examination:  General exam: Calm comfortable, on 2 L oxygen, chronically sick looking.  Not in any distress.   Respiratory system: Conducted airway sounds.   Cardiovascular system: S1 & S2 heard, regular.  No murmurs. Gastrointestinal system: Abdomen is nondistended, soft and nontender. No organomegaly or masses felt. Normal bowel sounds heard. Central nervous system: Alert and awake.  Follows simple commands.  not oriented.   Extremities: Patient was able to use right hand.  She did not move both her lower extremities.  Left upper extremity is contracted, able to squeeze fingers 2/5 Psychiatry: Judgement and insight appear compromised.  Flat affect. Stage IV sacral decubitus with dressing on    Data Reviewed: I have personally reviewed following labs and imaging studies  CBC: Recent Labs  Lab 10/08/19 0416 10/09/19 0137 10/10/19 0500 10/11/19 0500 10/12/19 0430  WBC 14.0* 19.4* 17.0*  11.7* 9.9  NEUTROABS  --   --   --   --  6.6  HGB 8.3* 9.0* 8.4* 8.2* 8.2*  HCT 27.4* 30.1* 28.7* 28.1* 27.5*  MCV 96.5 97.7 98.6 99.3 97.9  PLT 390 443* 383 300 [id number]   Basic Metabolic Panel: Recent Labs  Lab 10/06/19 0609 10/06/19 0609 10/07/19 0417 10/07/19 0417 10/08/19 0416 10/09/19 0137 10/09/19 1900 10/10/19 0500 10/11/19 0500 10/12/19 0430  NA 137   < > 134*   < > 139 141  --  144 147* 144  K 3.5   < > 4.4   < > 4.4 4.7  --  3.7 2.5* 4.3  CL 97*   < > 97*   < > 100 102  --  107 108 107  CO2 29   < > 26   < > 29 29  --  26 29 27   GLUCOSE 130*   < > 132*   < > 140* 117*  --  96 88 101*  BUN 44*   < > 47*   < > 47* 44*  --  49* 44* 30*  CREATININE 0.46   < > 0.57   < > 0.53 0.59  --  0.65 0.60 0.51  CALCIUM 8.5*   < > 8.5*   < > 8.7* 9.1  --  8.5* 8.4* 8.2*  MG 2.1  --  2.3  --   --   --  2.3  --   --  1.9  PHOS  --   --   --   --   --   --   --   --   --  2.3*   < > = values in this interval not displayed.   GFR: Estimated Creatinine Clearance: 60.6 mL/min (by C-G formula based on SCr of 0.51 mg/dL). Liver Function Tests: Recent Labs  Lab 10/08/19 0416 10/09/19 0137 10/10/19 0500 10/11/19 0500 10/12/19 0430  AST 50* 63* 37 30 27  ALT 53* 62* 48* 40 31  ALKPHOS 89 96 98 83 75  BILITOT 0.7 1.1 0.8 1.0 1.0  PROT 6.8 6.9 6.6 6.2* 5.8*  ALBUMIN 2.5* 2.6* 2.5* 2.4* 2.3*   No results for input(s): LIPASE, AMYLASE in the last 168 hours. Recent Labs  Lab 10/09/19 1013  AMMONIA 16   Coagulation Profile: No results for input(s): INR, PROTIME in the last 168 hours. Cardiac Enzymes: No results for input(s): CKTOTAL, CKMB, CKMBINDEX, TROPONINI in the last 168 hours. BNP (last 3 results) No results for input(s): PROBNP in the last 8760 hours. HbA1C: No results for input(s): HGBA1C in the last 72 hours. CBG: Recent Labs  Lab 10/11/19 1750 10/11/19 1955 10/11/19 2320 10/12/19 0351 10/12/19 0812  GLUCAP 105* 81 91 93 88   Lipid Profile: No results for  input(s): CHOL, HDL, LDLCALC, TRIG, CHOLHDL, LDLDIRECT in the last 72 hours. Thyroid Function Tests: No results for input(s): TSH, T4TOTAL, FREET4, T3FREE, THYROIDAB in the last 72 hours. Anemia Panel: No results for input(s): VITAMINB12, FOLATE, FERRITIN, TIBC, IRON, RETICCTPCT in the last 72 hours. Sepsis Labs: No results for input(s): PROCALCITON, LATICACIDVEN in the last 168 hours.  Recent Results (from the past 240 hour(s))  MRSA PCR Screening     Status: Abnormal   Collection Time: 10/09/19  9:21 AM   Specimen: Nasal Mucosa; Nasopharyngeal  Result Value Ref Range Status   MRSA by PCR POSITIVE (A) NEGATIVE Final    Comment:        The GeneXpert MRSA Assay (FDA approved for NASAL specimens only), is one component of a comprehensive MRSA colonization surveillance program. It is not intended to diagnose MRSA infection nor to guide or monitor treatment for MRSA infections. RESULT CALLED TO, READ BACK BY AND VERIFIED WITH: ROMINES,H. RN @1531  10/09/19 BILLINGSLEY,L Performed at [facility], [place] [address], [place], [place] [address]          Radiology Studies: DG CHEST PORT 1 VIEW  Result Date: 10/12/2019 CLINICAL DATA:  Pneumonia. EXAM: PORTABLE CHEST 1 VIEW COMPARISON:  10/10/2019.  CT 09/22/2019. FINDINGS: Mediastinum and hilar structures normal. Heart size normal. Left mid and lower lung infiltrates are again noted without interim change. No pleural effusion or pneumothorax. No pleural effusion or pneumothorax. Surgical clips left chest. Severe degenerative changes left shoulder. Moderate degenerative changes right shoulder. IMPRESSION: Left mid and lower lung infiltrates.  No interim change. Electronically Signed   By: Marcello Moores  Register   On: 10/12/2019 06:28        Scheduled Meds: . chlorhexidine  15 mL Mouth Rinse BID  . Chlorhexidine Gluconate Cloth  6 each Topical Daily  . collagenase   Topical Daily  . donepezil  10 mg Per Tube QHS  .  feeding supplement (PRO-STAT SUGAR FREE 64)  30 mL Per Tube Daily  . furosemide  40 mg Intravenous Daily  . Gerhardt's butt cream  Topical BID  . heparin  5,000 Units Subcutaneous Q8H  . levalbuterol  0.63 mg Nebulization Q8H  . mouth rinse  15 mL Mouth Rinse q12n4p  . memantine  10 mg Per Tube BID  . mupirocin ointment  1 application Nasal BID  . sennosides  5 mL Per Tube BID   Continuous Infusions: . sodium chloride Stopped (10/10/19 0257)  . ampicillin-sulbactam (UNASYN) IV 3 g (10/12/19 1036)  . dextrose 5% lactated ringers 75 mL/hr at 10/12/19 0500  . feeding supplement (OSMOLITE 1.2 CAL) Stopped (10/09/19 0200)  . vancomycin Stopped (10/11/19 1132)     LOS: 26 days    Time spent: 30 minutes     [name], MD [facility] Pager [phone]

## 2019-10-12 NOTE — Consult Note (Signed)
[facility] Surgery Consult Note  Barbara Flowers 19-Dec-1950  [id number].    Requesting MD: [name] Chief Complaint/Reason for Consult: G-tube placement HPI:  This is a 69 yo white female with multiple medical problems including wheelchair bound secondary to previous CVA with some contractures.  She has been admitted now for several weeks secondary to respiratory failure with MRSA PNA, encephalopathy, bacteremia, intermittent a fib.  She has had a complex hospital course secondary to intubation, extubation, and worsening respiratory status along with further complicating features from the aforementioned problems. Seen previously this week for sacral decubitus wound which is now being treated with hydrotherapy. General surgery now consulted for consideration of G-tube placement. Patient has hx of Roux-en-Y gastric bypass and is unable to have a percutaneous g-tube placed so we have been asked to evaluate her for surgical g-tube placement.  ROS: Review of Systems  Constitutional: Positive for malaise/fatigue.  Neurological: Positive for weakness.  All other systems reviewed and are negative. except for the above noted.  Family History  Problem Relation Age of Onset  . Hypertension Mother   . Stroke Mother   . Heart disease Father   . Hypertension Father     Past Medical History:  Diagnosis Date  . Anemia   . Anemia   . Anxiety disorder   . Blood transfusion without reported diagnosis   . Brain cancer [facility])    s/p sterotactic radio surgery  . Brain tumor ([place])   . Breast cancer ([place])   . Cancer ([place]) 1990   L breast  . H/O bone marrow transplant ([place])   . Hypothyroidism   . IBS (irritable bowel syndrome)   . MDD (major depressive disorder)   . Memory loss   . Obesity   . Orthostatic hypotension   . Seizure ([place])   . Seizures ([place])   . Stroke [facility]) 2005   hemorrhagic  . Stroke ([place])   . Subdural hematoma ([place])   . Upper brachial plexus paralysis syndrome   .  Urinary incontinence     Past Surgical History:  Procedure Laterality Date  . BREAST LUMPECTOMY Left    post chemotherapy and radiation  . CHOLECYSTECTOMY    . CRANIOTOMY FOR HEMISPHERECTOMY TOTAL / PARTIAL Right   . GASTRIC BYPASS  1985  . HAND SURGERY Left 2015  . HAND TENDON SURGERY Left    for brachial plexus injury  . LYMPH NODE BIOPSY      Social History:  reports that she has never smoked. She has never used smokeless tobacco. She reports current alcohol use. She reports that she does not use drugs.  Allergies:  Allergies  Allergen Reactions  . Gabapentin Other (See Comments)    seizure  . Tramadol Other (See Comments)    Brings on Seizures     Medications Prior to Admission  Medication Sig Dispense Refill  . ALPRAZolam (XANAX) 1 MG tablet Take 1 mg by mouth at bedtime as needed for anxiety.    . Ascorbic Acid (VITAMIN C) 1000 MG tablet Take 1,000 mg by mouth daily.    Marland Kitchen aspirin EC 81 MG tablet Take 81 mg by mouth daily.    Marland Kitchen atorvastatin (LIPITOR) 20 MG tablet Take 20 mg by mouth at bedtime.    Marland Kitchen azithromycin (ZITHROMAX) 250 MG tablet Take 1 tablet by mouth taper from 4 doses each day to 1 dose and stop.    . Cholecalciferol (VITAMIN D3) 50 MCG (2000 UT) TABS Take 2,000 Units by mouth daily.    Marland Kitchen  donepezil (ARICEPT) 10 MG tablet Take 10 mg by mouth at bedtime.    . ferrous sulfate 325 (65 FE) MG EC tablet Take 325 mg by mouth daily with breakfast.    . memantine (NAMENDA) 10 MG tablet Take 10 mg by mouth 2 (two) times daily.     . midodrine (PROAMATINE) 10 MG tablet Take 10 mg by mouth 3 (three) times daily.     . Multiple Vitamins-Minerals (MULTIVITAMIN WITH MINERALS) tablet Take 1 tablet by mouth daily.    . pantoprazole (PROTONIX) 40 MG tablet Take 40 mg by mouth daily.    . Potassium 99 MG TABS Take 99 mg by mouth daily.    . QUEtiapine (SEROQUEL) 100 MG tablet Take 100 mg by mouth at bedtime.    . chlorhexidine (PERIDEX) 0.12 % solution 15 mLs by Mouth Rinse  route 2 (two) times daily. (Patient not taking: Reported on 09/16/2019) 120 mL 0  . magnesium oxide (MAG-OX) 400 (241.3 Mg) MG tablet Take 1 tablet (400 mg total) by mouth 2 (two) times daily. (Patient not taking: Reported on 09/16/2019) 60 tablet 0   Physical Exam: Blood pressure (!) 147/52, pulse (!) 44, temperature 98.6 F (37 C), temperature source Oral, resp. rate 18, height 5\' 8"  (1.727 m), weight 57.8 kg, SpO2 97 %. General: chronically ill-appearing white female who is laying in bed in NAD HEENT: head is normocephalic, atraumatic. Sclera are noninjected.  PERRL.  Ears and nose without any masses or lesions.  Mouth is pink Heart: RRR.  Normal s1,s2. No obvious murmurs, gallops, or rubs noted.  Palpable radial and pedal pulses bilaterally Lungs: few rhonchi, respiratory effort nonlabored, some congestion and weak cough as well Abd: soft, NT, ND, +BS, no masses, hernias, or organomegaly Psych: seemed lucid today, answered questions very appropriately.   Results for orders placed or performed during the hospital encounter of 09/16/19 (from the past 48 hour(s))  Glucose, capillary     Status: None   Collection Time: 10/10/19  3:45 PM  Result Value Ref Range   Glucose-Capillary 75 70 - 99 mg/dL  Glucose, capillary     Status: None   Collection Time: 10/11/19  4:28 AM  Result Value Ref Range   Glucose-Capillary 83 70 - 99 mg/dL  Comprehensive metabolic panel     Status: Abnormal   Collection Time: 10/11/19  5:00 AM  Result Value Ref Range   Sodium 147 (H) 135 - 145 mmol/L   Potassium 2.5 (LL) 3.5 - 5.1 mmol/L    Comment: DELTA CHECK NOTED CRITICAL RESULT CALLED TO, READ BACK BY AND VERIFIED WITH: BURDICK,J @ 0611 ON [id number] BY POTEAT,S    Chloride 108 98 - 111 mmol/L   CO2 29 22 - 32 mmol/L   Glucose, Bld 88 70 - 99 mg/dL   BUN 44 (H) 8 - 23 mg/dL   Creatinine, Ser 0.60 0.44 - 1.00 mg/dL   Calcium 8.4 (L) 8.9 - 10.3 mg/dL   Total Protein 6.2 (L) 6.5 - 8.1 g/dL   Albumin 2.4 (L)  3.5 - 5.0 g/dL   AST 30 15 - 41 U/L   ALT 40 0 - 44 U/L   Alkaline Phosphatase 83 38 - 126 U/L   Total Bilirubin 1.0 0.3 - 1.2 mg/dL   GFR calc non Af Amer >60 >60 mL/min   GFR calc Af Amer >60 >60 mL/min   Anion gap 10 5 - 15    Comment: Performed at [facility], [place]  [address] [place], [place] [address]  CBC     Status: Abnormal   Collection Time: 10/11/19  5:00 AM  Result Value Ref Range   WBC 11.7 (H) 4.0 - 10.5 K/uL   RBC 2.83 (L) 3.87 - 5.11 MIL/uL   Hemoglobin 8.2 (L) 12.0 - 15.0 g/dL   HCT 28.1 (L) 36.0 - 46.0 %   MCV 99.3 80.0 - 100.0 fL   MCH 29.0 26.0 - 34.0 pg   MCHC 29.2 (L) 30.0 - 36.0 g/dL   RDW 14.4 11.5 - 15.5 %   Platelets 300 150 - 400 K/uL   nRBC 0.0 0.0 - 0.2 %    Comment: Performed at [facility], [place] [address] [place], [place] [address]  Glucose, capillary     Status: Abnormal   Collection Time: 10/11/19  4:26 PM  Result Value Ref Range   Glucose-Capillary 66 (L) 70 - 99 mg/dL   Comment 1 Notify RN    Comment 2 Document in Chart   Glucose, capillary     Status: Abnormal   Collection Time: 10/11/19  5:50 PM  Result Value Ref Range   Glucose-Capillary 105 (H) 70 - 99 mg/dL   Comment 1 Notify RN    Comment 2 Document in Chart   Glucose, capillary     Status: None   Collection Time: 10/11/19  7:55 PM  Result Value Ref Range   Glucose-Capillary 81 70 - 99 mg/dL   Comment 1 Notify RN    Comment 2 Document in Chart   Glucose, capillary     Status: None   Collection Time: 10/11/19 11:20 PM  Result Value Ref Range   Glucose-Capillary 91 70 - 99 mg/dL   Comment 1 Notify RN    Comment 2 Document in Chart   Glucose, capillary     Status: None   Collection Time: 10/12/19  3:51 AM  Result Value Ref Range   Glucose-Capillary 93 70 - 99 mg/dL  CBC with Differential/Platelet     Status: Abnormal   Collection Time: 10/12/19  4:30 AM  Result Value Ref Range   WBC 9.9 4.0 - 10.5 K/uL   RBC 2.81 (L) 3.87 - 5.11 MIL/uL    Hemoglobin 8.2 (L) 12.0 - 15.0 g/dL   HCT 27.5 (L) 36.0 - 46.0 %   MCV 97.9 80.0 - 100.0 fL   MCH 29.2 26.0 - 34.0 pg   MCHC 29.8 (L) 30.0 - 36.0 g/dL   RDW 14.3 11.5 - 15.5 %   Platelets 314 150 - 400 K/uL   nRBC 0.2 0.0 - 0.2 %   Neutrophils Relative % 67 %   Neutro Abs 6.6 1.7 - 7.7 K/uL   Lymphocytes Relative 22 %   Lymphs Abs 2.2 0.7 - 4.0 K/uL   Monocytes Relative 8 %   Monocytes Absolute 0.8 0.1 - 1.0 K/uL   Eosinophils Relative 1 %   Eosinophils Absolute 0.1 0.0 - 0.5 K/uL   Basophils Relative 0 %   Basophils Absolute 0.0 0.0 - 0.1 K/uL   Immature Granulocytes 2 %   Abs Immature Granulocytes 0.19 (H) 0.00 - 0.07 K/uL    Comment: Performed at [facility], [place] [address] [place], [place] [address]  Comprehensive metabolic panel     Status: Abnormal   Collection Time: 10/12/19  4:30 AM  Result Value Ref Range   Sodium 144 135 - 145 mmol/L   Potassium 4.3 3.5 - 5.1 mmol/L    Comment: DELTA  CHECK NOTED   Chloride 107 98 - 111 mmol/L   CO2 27 22 - 32 mmol/L   Glucose, Bld 101 (H) 70 - 99 mg/dL   BUN 30 (H) 8 - 23 mg/dL   Creatinine, Ser 0.51 0.44 - 1.00 mg/dL   Calcium 8.2 (L) 8.9 - 10.3 mg/dL   Total Protein 5.8 (L) 6.5 - 8.1 g/dL   Albumin 2.3 (L) 3.5 - 5.0 g/dL   AST 27 15 - 41 U/L   ALT 31 0 - 44 U/L   Alkaline Phosphatase 75 38 - 126 U/L   Total Bilirubin 1.0 0.3 - 1.2 mg/dL   GFR calc non Af Amer >60 >60 mL/min   GFR calc Af Amer >60 >60 mL/min   Anion gap 10 5 - 15    Comment: Performed at [facility], [place] [address], [place], [place] [address]  Magnesium     Status: None   Collection Time: 10/12/19  4:30 AM  Result Value Ref Range   Magnesium 1.9 1.7 - 2.4 mg/dL    Comment: Performed at [facility], [place] [address], [place], [place] [address]  Phosphorus     Status: Abnormal   Collection Time: 10/12/19  4:30 AM  Result Value Ref Range   Phosphorus 2.3 (L) 2.5 - 4.6 mg/dL    Comment: Performed  at [facility], [place] [address] [place], [place] [address]  Glucose, capillary     Status: None   Collection Time: 10/12/19  8:12 AM  Result Value Ref Range   Glucose-Capillary 88 70 - 99 mg/dL   Comment 1 Notify RN    Comment 2 Document in Chart   Glucose, capillary     Status: None   Collection Time: 10/12/19 11:24 AM  Result Value Ref Range   Glucose-Capillary 88 70 - 99 mg/dL   CT ABDOMEN WO CONTRAST  Result Date: 10/12/2019 CLINICAL DATA:  Stroke, preop planning for gastrostomy tube EXAM: CT ABDOMEN WITHOUT CONTRAST TECHNIQUE: Multidetector CT imaging of the abdomen was performed following the standard protocol without IV contrast. COMPARISON:  09/22/2019 CT chest FINDINGS: Lower chest: Resolution of pleural effusions. Pleural based nodular consolidation laterally in the left lower lung. Scattered nodular airspace opacities in the basilar segments of the left lower lung. Coarse calcifications in the left breast. Hepatobiliary: No focal liver abnormality is seen. Status post cholecystectomy. No biliary dilatation. Pancreas: Unremarkable. No pancreatic ductal dilatation or surrounding inflammatory changes. Spleen: Normal in size with focal coarse linear calcification. Adrenals/Urinary Tract: No adrenal mass. Kidneys normal in size. No urolithiasis or hydronephrosis. Stomach/Bowel: Surgical staples near the GE junction. 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 This does not allow percutaneous gastrostomy placement. Visualized small bowel is decompressed. Residual oral contrast material in the visualized colon which is nondilated. Vascular/Lymphatic: Aortic Atherosclerosis (ICD10-170.0). No abdominal or mesenteric adenopathy. Other: No free air.  No ascites. Musculoskeletal: Mild lumbar levoscoliosis apex L2. Mild T10 and T11 wedge deformities, age indeterminate. No acute fracture or worrisome bone lesion. IMPRESSION: 1. The patient  is status post gastric bypass surgery, not a candidate for percutaneous gastrostomy placement. Consider surgical consultation. Electronically Signed   By: Lucrezia Europe M.D.   On: 10/12/2019 15:34   DG CHEST PORT 1 VIEW  Result Date: 10/12/2019 CLINICAL DATA:  Pneumonia. EXAM: PORTABLE CHEST 1 VIEW COMPARISON:  10/10/2019.  CT 09/22/2019. FINDINGS: Mediastinum and hilar structures normal. Heart size normal. Left mid and lower lung infiltrates  are again noted without interim change. No pleural effusion or pneumothorax. No pleural effusion or pneumothorax. Surgical clips left chest. Severe degenerative changes left shoulder. Moderate degenerative changes right shoulder. IMPRESSION: Left mid and lower lung infiltrates.  No interim change. Electronically Signed   By: Marcello Moores  Register   On: 10/12/2019 06:28      Assessment/Plan Hx of breast CA Hx of brain tumor  Hx of CVA Acute respiratory failure in the setting of MRSA PNA Acute metabolic encephalopathy on chronic dementia Acute on chronic combined biventricular CHF Paroxysmal A. Fib with RVR Protein calorie malnutrition, moderate SVT Sacral decubitus wound - continue hydrotx *above per primary service*  Consult for G-tube placement - Discussed with pulmonology regarding respiratory status, concern for ability to come off ventilator more related to significant deconditioning rather than actual pulmonary function - Hx of Roux-en-Y gastric bypass - IR unable to place due to bypass - Upon evaluation of the patient today she is very lucid at this time and very clear states that [place].  When I clarified that if she did not get this and she could not eat, that she would get very weak and would ultimately pass away from malnutrition.  She states she understands this and she is ok with dying.  She asked me to speak to her husband to relay this to him.  I tried to call him but it went to voicemail.  I will try to contact him  tomorrow.  We do not feel good about placing a feeding tube in this patient given she clearly states today that she does not want aggressive care and is "tired" of all of this.  Henreitta Cea 4:31 PM 10/12/2019

## 2019-10-12 NOTE — Progress Notes (Signed)
SLP Cancellation Note  Patient Details Name: Barbara Flowers MRN: [id number] DOB: 12-Jan-1951   Cancelled treatment:       Reason Eval/Treat Not Completed: Patient at procedure or test/unavailable(pt in CT at this time, will continue efforts, note plan for possible PEG)   [name] 10/12/2019, 11:53 AM  Kathleen Lime, MS [place] Office [phone]

## 2019-10-12 NOTE — Progress Notes (Signed)
IR requested by Dr. Sloan Leiter for possible image-guided percutaneous gastrostomy tube placement.  CT abdomen/pelvis from today reviewed by Dr. Anselm Pancoast who states patient's anatomy is not amendable to percutaneous gastrostomy tube placement (history of roux en y). Recommend surgical consultation for possible gastrostomy tube placement. No plans for IR intervention at this time- will delete order. Dr. Sloan Leiter made aware.  IR available in future if needed.   Bea Graff [place] Scheffler, PA-C 10/12/2019, 3:24 PM

## 2019-10-12 NOTE — Progress Notes (Signed)
Unable to draw blood from midline after repositioning pts arm and  flushing.  Lab to draw morning labs

## 2019-10-12 NOTE — Progress Notes (Signed)
Pharmacy Antibiotic Note  Barbara Flowers is a 69 y.o. female admitted on 09/16/2019 with pneumonia.  Pharmacy has been consulted for Unasyn and vancomycin dosing for aspiration PNA.   Plan: Unasyn 3 gm IV q6h Vancomycin 1250 mgx1, then 1 gm IV q24 for est AUC 468 from levels drawn 1/10-1/11 Renal fx stable, WBC now wnl  Height: 5\' 8"  (172.7 cm) Weight: 127 lb 6.8 oz (57.8 kg) IBW/kg (Calculated) : 63.9  Temp (24hrs), Avg:97.9 F (36.6 C), Min:97 F (36.1 C), Max:98.7 F (37.1 C)  Recent Labs  Lab 10/08/19 0416 10/09/19 0137 10/10/19 0500 10/11/19 0500 10/12/19 0430  WBC 14.0* 19.4* 17.0* 11.7* 9.9  CREATININE 0.53 0.59 0.65 0.60 0.51    Estimated Creatinine Clearance: 60.6 mL/min (by C-G formula based on SCr of 0.51 mg/dL).    Allergies  Allergen Reactions  . Gabapentin Other (See Comments)    seizure  . Tramadol Other (See Comments)    Brings on Seizures    Antimicrobials this admission:  12/26 CTX >> 1/8 12/27 Azith >> 12/28 1/7 vanc >>1/14  1/18>> 1/18 Unasyn>> Dose adjustments this admission:  1/10 Vpk at 1645 = 53 (vanc 1500 mg dose given on 1/10 at 1320) 1/11 VT at 1307= 26 (AUC 936, ke= 0.0350, t1/2= 19.8, Vd= 45.8) - spoke to RN, 1500mg  dose was not given. changed dose to 1000 mg 24h for AUC 468, Cmax 58 and Cmin 13 Microbiology results:  12/26 BCx: 1/4 bottles with MR-CoNS AND proteus (pan-sensitive) 12/28 Pleural Fluid:  ngF 12/27 MRSA PCR: negative 1/2 Pleural fluid: neg FINAL 1/6 resp culture: few staph - MRSA FINAL  Thank you for allowing pharmacy to be a part of this patient's care.  Minda Ditto PharmD 10/12/2019, 9:48 AM

## 2019-10-13 ENCOUNTER — Inpatient Hospital Stay (HOSPITAL_COMMUNITY): Payer: Medicare Other

## 2019-10-13 LAB — GLUCOSE, CAPILLARY
Glucose-Capillary: 112 mg/dL — ABNORMAL HIGH (ref 70–99)
Glucose-Capillary: 85 mg/dL (ref 70–99)
Glucose-Capillary: 79 mg/dL (ref 70–99)
Glucose-Capillary: 75 mg/dL (ref 70–99)
Glucose-Capillary: 112 mg/dL — ABNORMAL HIGH (ref 70–99)

## 2019-10-13 LAB — COMPREHENSIVE METABOLIC PANEL
ALT: 28 U/L (ref 0–44)
AST: 24 U/L (ref 15–41)
Albumin: 2.2 g/dL — ABNORMAL LOW (ref 3.5–5.0)
Alkaline Phosphatase: 76 U/L (ref 38–126)
Anion gap: 9 (ref 5–15)
BUN: 22 mg/dL (ref 8–23)
CO2: 28 mmol/L (ref 22–32)
Calcium: 8 mg/dL — ABNORMAL LOW (ref 8.9–10.3)
Chloride: 105 mmol/L (ref 98–111)
Creatinine, Ser: 0.49 mg/dL (ref 0.44–1.00)
GFR calc Af Amer: >60 mL/min (ref >60)
GFR calc non Af Amer: >60 mL/min (ref >60)
Glucose, Bld: 157 mg/dL — ABNORMAL HIGH (ref 70–99)
Potassium: 4.1 mmol/L (ref 3.5–5.1)
Sodium: 142 mmol/L (ref 135–145)
Total Bilirubin: 0.6 mg/dL (ref 0.3–1.2)
Total Protein: 5.8 g/dL — ABNORMAL LOW (ref 6.5–8.1)

## 2019-10-13 LAB — CBC WITH DIFFERENTIAL/PLATELET
Abs Immature Granulocytes: 0.2 10*3/uL — ABNORMAL HIGH (ref 0.00–0.07)
Basophils Absolute: 0.1 10*3/uL (ref 0.0–0.1)
Basophils Relative: 1 %
Eosinophils Absolute: 0.1 10*3/uL (ref 0.0–0.5)
Eosinophils Relative: 1 %
HCT: 28.9 % — ABNORMAL LOW (ref 36.0–46.0)
Hemoglobin: 8.6 g/dL — ABNORMAL LOW (ref 12.0–15.0)
Immature Granulocytes: 2 %
Lymphocytes Relative: 23 %
Lymphs Abs: 2.6 10*3/uL (ref 0.7–4.0)
MCH: 29.1 pg (ref 26.0–34.0)
MCHC: 29.8 g/dL — ABNORMAL LOW (ref 30.0–36.0)
MCV: 97.6 fL (ref 80.0–100.0)
Monocytes Absolute: 0.9 10*3/uL (ref 0.1–1.0)
Monocytes Relative: 8 %
Neutro Abs: 7.1 10*3/uL (ref 1.7–7.7)
Neutrophils Relative %: 65 %
Platelets: 295 10*3/uL (ref 150–400)
RBC: 2.96 MIL/uL — ABNORMAL LOW (ref 3.87–5.11)
RDW: 14.2 % (ref 11.5–15.5)
WBC: 11 10*3/uL — ABNORMAL HIGH (ref 4.0–10.5)
nRBC: 0 % (ref 0.0–0.2)

## 2019-10-13 LAB — MAGNESIUM: Magnesium: 2.3 mg/dL (ref 1.7–2.4)

## 2019-10-13 LAB — PHOSPHORUS: Phosphorus: 4.1 mg/dL (ref 2.5–4.6)

## 2019-10-13 MED ORDER — ALBUTEROL SULFATE (2.5 MG/3ML) 0.083% IN NEBU: 2.5000 mg | INHALATION_SOLUTION | RESPIRATORY_TRACT | Status: DC | PRN

## 2019-10-13 NOTE — Progress Notes (Signed)
Modified Barium Swallow Progress Note  Patient Details  Name: Barbara Flowers MRN: [id number] Date of Birth: [birth year]  Today's Date: 10/13/2019  Modified Barium Swallow completed.  Full report located under Chart Review in the Imaging Section.  Brief recommendations include the following:  Clinical Impression Suboptimal view due to pt's kyphosis and side lying position but tested to best ability.     Patient present with moderately severe oropharyngeal dysphagia with sensorimotor deficits. Lingual pumping with premature spillage and impaired bolus cohesion due to pt's weakness/disoordination.  With delays she often reflexively swallows to help clear oral residuals but at times requires cues to swallow.  Pt penetration and aspirated with thin and nectar consistencies due both during swallow *due to decreased laryngeal closure* and after *pyriform sinus residuals spilling into open airway (with further swallow to clear oral residual).  Aspiration at times was silent *trace amount* and other times with reflexive cough *larger amount* mixed with secretions. Reflexive cough did not clear aspirates and was very weak.  Pharyngeal retention noted without pt adequate sensation.  Cued "hock" did not clear pharyngeal retention. Chin tuck attemptd under flouro but pt unable to perform adequately.    Due to pt's level of dysphagia and gross weakness, she will likely aspirate with po provided.  However also suspect she is aspirating secretions based on secretion retention on MBS.    If family, pt and MD desire for pt to consume po with aspiration mitigation, recommend to consider nectar thick liquids (Ensures, Glucerna, etc) - would advise against anything of increased viscocity.  Tsps of water after mouth care advised to enhance oral hygeine and pt comfort.  Being able to optain nutritional support via po alone will be very challenging for this pt due to her level of dysphagia.   Swallow Evaluation  Recommendations       SLP Diet Recommendations: (see clinical impressions- if desire po with risks rec nectar liquids and tsps water between meals)       Medication Administration: Via alternative means   Supervision: Full assist for feeding   Compensations: Slow rate;Small sips/bites;Multiple dry swallows after each bite/sip   Postural Changes: Seated upright at 90 degrees;Remain semi-upright after after feeds/meals (Comment)   Oral Care Recommendations: Oral care QID      Kathleen Lime, MS [facility] SLP Acute Rehab Services Office [phone]  [name] 10/13/2019,1:04 PM

## 2019-10-13 NOTE — Progress Notes (Signed)
Patient asked nurse to "please take this tube out of my nose. This isn't right. This isn't what I want, I want to die. Pleas let me die." This RN explained that patient's husband is making her medical decisions as of now and he wishes to have the NG tube in place. Patient responded with "Please let me die. Please don't leave me." Patient also expressed that she is "tired of fighting and that it is okay to let me go." Patient asked RN "Are you ready? Are you getting ready for me to die?" At this time patient is oriented to self, place, and somewhat to situation.

## 2019-10-13 NOTE — Progress Notes (Signed)
Physical Therapy Hydrotherapy Treatment Note    10/13/19 1600  Subjective Assessment  Subjective "how much longer?"  Patient and Family Stated Goals none stated  Date of Onset  (present on admission)  Evaluation and Treatment  Evaluation and Treatment Procedures Explained to Patient/Family Yes  Evaluation and Treatment Procedures agreed to (husband present at start of session)  Pressure Injury 09/17/19 Sacrum Medial;Left Unstageable - Full thickness tissue loss in which the base of the injury is covered by slough (yellow, tan, gray, green or brown) and/or eschar (tan, brown or black) in the wound bed. wound w eschar  Date First Assessed/Time First Assessed: 09/17/19 0245   Location: Sacrum  Location Orientation: Medial;Left  Staging: Unstageable - Full thickness tissue loss in which the base of the injury is covered by slough (yellow, tan, gray, green or brown) an...  Dressing Type Foam - Lift dressing to assess site every shift;Moist to dry (santyl)  Dressing Changed  Dressing Change Frequency Daily  State of Healing Non-healing  Site / Wound Assessment Red;Pink;Yellow;Brown  % Wound base Red or Granulating 30%  % Wound base Yellow/Fibrinous Exudate 60%  % Wound base Black/Eschar 10%  % Wound base Other/Granulation Tissue (Comment)  (tissue 12-3:00, appears to be turning necrotic)  Peri-wound Assessment Black;Pink;Erythema (non-blanchable);Maceration  Margins Unattached edges (unapproximated)  Drainage Amount Moderate  Drainage Description Serosanguineous;Odor  Treatment Debridement (Selective);Hydrotherapy (Pulse lavage);Packing (Saline gauze) (enzymatic debridement, barrier cream to intact periwound )  Hydrotherapy  Pulsed lavage therapy - wound location sacrum  Pulsed Lavage with Suction (psi) 8 psi  Pulsed Lavage with Suction - Normal Saline Used 1000 mL  Pulsed Lavage Tip Tip with splash shield  Selective Debridement  Selective Debridement - Location sacrum  Selective  Debridement - Tools Used Forceps;Scissors  Selective Debridement - Tissue Removed yellow slough  Wound Therapy - Assess/Plan/Recommendations  Wound Therapy - Clinical Statement 69 yo female admitted to hospital with weakness. Hx of CVA with L residual weakness, dementia, Parkinsons, sacral wounds, contractures. WC bound PTA.   Wound Therapy - Functional Problem List CVA with L residual weakness, contractures, Parkinson's  Factors Delaying/Impairing Wound Healing Incontinence;Immobility  Hydrotherapy Plan Debridement;Dressing change;Patient/family education;Pulsatile lavage with suction  Wound Therapy - Frequency 6X / week  Wound Therapy - Current Recommendations WOC nurse;Case manager/social work  Wound Therapy - Follow Up Recommendations Wound Care Center;Skilled nursing facility  Wound Plan Palliative following. Questionable Prognosis. Will plan to continue hydro, dressing changes. Will assess and update recommendations as needed.   Wound Therapy Goals - Improve the function of patient's integumentary system by progressing the wound(s) through the phases of wound healing by:  Decrease Necrotic Tissue to 30  Decrease Necrotic Tissue - Progress Progressing toward goal  Increase Granulation Tissue to 70  Increase Granulation Tissue - Progress Progressing toward goal  Goals/treatment plan/discharge plan were made with and agreed upon by patient/family No, Patient unable to participate in goals/treatment/discharge plan and family unavailable (husband stepped out of room)  Time For Goal Achievement 2 weeks  Wound Therapy - Potential for Goals Poor   Time: [id number] - [place], [place] Office: [phone]

## 2019-10-13 NOTE — Progress Notes (Signed)
PROGRESS NOTE    Barbara Flowers  [id number] DOB: Sep 02, 1951 DOA: 09/16/2019 PCP: [name], NP    Brief Narrative:  Patient is here for prolonged period. Previous events and treatments were based upon previous physicians notes and records as below. 69 year old female with history of breast cancer, dementia, history of CVA with left-sided hemiplegia, nursing home resident, wheelchair-bound for last 3 years presented to the emergency room on 09/16/2019 with 1 week of cough, congestion, worsening shortness of breath.  Initially patient was found with extensive left-sided infiltrate and left-sided effusion with hypoxemia corrected with nonrebreather and admitted to ICU.  Events as below: 12/28 thoracentesis 1/2 thora centesis 1/3 intubated and transferred to ICU 1/8 started vanc for MRSA sputum 1/11 not weaning well 1/13 more awake weaning x 2 hrs->spoke w/ family still want full support 1/14 acceptable rapid shallow. Following commands, extubated.   1/15 looks better ready for floor. PCCM sign off.  1/18 Rapid response overnight, suspected ongoing aspiration, copious thick secretions, poor mentation. PCCM re-engaged , back on vancomycin and Unasyn. 1/22 patient is clinically improving, remains with aspiration risk. 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 Surgery consulted for surgical G-tube placement, patient declined.  Antibiotics: Rocephin 12/26 >> (14 day course) 1/8 Azithromycin 12/27 > 28 Vancomycin 12/26, 1/7-1/12, 1/18 > Unasyn 1/18 >   Diagnostic data: Covid 19 PCR 12/26 >> neg RVP 12/26 >>neg  Procalcitonin 12/26 >> 2.01, down trended to 0.4 with abx BCx2 12/26 >>Pan sensitive Proteus Mirabilis UC 12/26 >> negative Urine Pneum Ag 12/26 >> positive Respiratory culture 1/6>> MRSA  Assessment & Plan:   Active Problems:   Dementia ([place])   CAP (community acquired pneumonia)   Pleural  effusion on left   Acute prerenal azotemia   Hypernatremia   Pressure injury of skin   Malnutrition of moderate degree   Acute hypoxemic respiratory failure (HCC)   Pleural effusion   Acute systolic heart failure (HCC)   Failure to thrive (0-17)   Palliative care by specialist   Goals of care, counseling/discussion   Generalized weakness  Acute hypoxemic and hypercarbic respiratory failure in the setting of MRSA pneumonia, poor cough clearance and extreme debility: Thoracentesis x2, status post intubation and extubated on 1/14 Continue aggressive bronchodilator therapy, chest physiotherapy and deep breathing exercises if she can do it. Suspected new aspiration event on 1/18, currently on vancomycin and Unasyn that we will continue for 1 week. High risk of aspiration events. Patient remains on room air now.  Acute metabolic encephalopathy with history of underlying dementia: CT head negative.  More responsive and awake now. She declined procedure with the surgery.  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 fib with RVR: EF 25 to 30%.  Currently clinically euvolemic.  Occasionally on maintenance dextrose as her blood sugars drop. On IV Lasix along with potassium supplements that we will continue.  Hypokalemia: Severe and persistent.  Aggressively replaced.  Magnesium phosphorus normal.  Continue close monitoring.  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  Seen by surgery.  Bedside debridement.  PT doing hydrotherapy.  Nutrition: Patient has been n.p.o.  Multiple attempts of NG tube placement failed 1/19. Evaluated by speech therapy, unable to safely swallow. IR unable to do PEG tube due to Roux-en-Y. Patient declined to have procedure with surgery. Modified barium swallow today as she is more lucid. If fails, will consider NG tube under fluoroscopy for time being.  Fluid: Intermittent dextrose infusion  along with Lasix.   Goal of care: Family desired full code including tracheostomy if needed, PEG tube placement.    DVT prophylaxis: Heparin subcu Code Status: Full code Family Communication: Talking to patient's husband every day.  Will come back to talk to him.   Disposition Plan: Unknown.  Stepdown unit.  Probably home with family after clinical stabilization.   Consultants:   PCCM,  Surgery,  Palliative medicine  Procedures:   Bedside wound debridement, 1/19  Antimicrobials:   See above.  Currently on Unasyn and vancomycin.   Subjective: Patient seen and examined.  No overnight events.  Remains on room air.  Today she was more awake, able to keep up with conversations.  She knew she is in the hospital, she did not know the date but she knew the circumstances is in the hospital. Objective: Vitals:   10/13/19 0600 10/13/19 0700 10/13/19 0758 10/13/19 0806  BP: (!) 111/56 (!) 118/59  94/68  Pulse: (!) 43 61  63  Resp: 14 15  16   Temp:    98.1 F (36.7 C)  TempSrc:    Oral  SpO2: 96% 99% 98% 100%  Weight:      Height:        Intake/Output Summary (Last 24 hours) at 10/13/2019 1057 Last data filed at 10/13/2019 0818 Gross per 24 hour  Intake 3092.15 ml  Output 1000 ml  Net 2092.15 ml   Filed Weights   10/10/19 0500 10/12/19 0400 10/13/19 0413  Weight: 57 kg 57.8 kg 57.9 kg    Examination:  General exam: Calm comfortable, on room air.  Not in any distress.   Respiratory system: Bilaterally clear. Cardiovascular system: S1 & S2 heard, regular.  No murmurs. Gastrointestinal system: Abdomen is nondistended, soft and nontender. No organomegaly or masses felt. Normal bowel sounds heard. Central nervous system: Alert and awake.  Follows commands.     Extremities: Patient was able to use right hand.  She did not move both her lower extremities.  Left upper extremity is contracted, able to squeeze fingers 2/5 Psychiatry: Judgement and insight appear compromised.   Flat affect. Stage IV sacral decubitus with dressing on    Data Reviewed: I have personally reviewed following labs and imaging studies  CBC: Recent Labs  Lab 10/09/19 0137 10/10/19 0500 10/11/19 0500 10/12/19 0430 10/13/19 0149  WBC 19.4* 17.0* 11.7* 9.9 11.0*  NEUTROABS  --   --   --  6.6 7.1  HGB 9.0* 8.4* 8.2* 8.2* 8.6*  HCT 30.1* 28.7* 28.1* 27.5* 28.9*  MCV 97.7 98.6 99.3 97.9 97.6  PLT 443* 383 300 314 [id number]   Basic Metabolic Panel: Recent Labs  Lab 10/07/19 0417 10/08/19 0416 10/09/19 0137 10/09/19 1900 10/10/19 0500 10/11/19 0500 10/12/19 0430 10/13/19 0149  NA 134*   < > 141  --  144 147* 144 142  K 4.4   < > 4.7  --  3.7 2.5* 4.3 4.1  CL 97*   < > 102  --  107 108 107 105  CO2 26   < > 29  --  26 29 27 28   GLUCOSE 132*   < > 117*  --  96 88 101* 157*  BUN 47*   < > 44*  --  49* 44* 30* 22  CREATININE 0.57   < > 0.59  --  0.65 0.60 0.51 0.49  CALCIUM 8.5*   < > 9.1  --  8.5* 8.4* 8.2* 8.0*  MG 2.3  --   --  2.3  --   --  1.9 2.3  PHOS  --   --   --   --   --   --  2.3* 4.1   < > = values in this interval not displayed.   GFR: Estimated Creatinine Clearance: 60.7 mL/min (by C-G formula based on SCr of 0.49 mg/dL). Liver Function Tests: Recent Labs  Lab 10/09/19 0137 10/10/19 0500 10/11/19 0500 10/12/19 0430 10/13/19 0149  AST 63* 37 30 27 24   ALT 62* 48* 40 31 28  ALKPHOS 96 98 83 75 76  BILITOT 1.1 0.8 1.0 1.0 0.6  PROT 6.9 6.6 6.2* 5.8* 5.8*  ALBUMIN 2.6* 2.5* 2.4* 2.3* 2.2*   No results for input(s): LIPASE, AMYLASE in the last 168 hours. Recent Labs  Lab 10/09/19 1013  AMMONIA 16   Coagulation Profile: No results for input(s): INR, PROTIME in the last 168 hours. Cardiac Enzymes: No results for input(s): CKTOTAL, CKMB, CKMBINDEX, TROPONINI in the last 168 hours. BNP (last 3 results) No results for input(s): PROBNP in the last 8760 hours. HbA1C: No results for input(s): HGBA1C in the last 72 hours. CBG: Recent Labs  Lab  10/12/19 1735 10/12/19 1947 10/12/19 2317 10/13/19 0317 10/13/19 0754  GLUCAP 97 108* 135* 112* 85   Lipid Profile: No results for input(s): CHOL, HDL, LDLCALC, TRIG, CHOLHDL, LDLDIRECT in the last 72 hours. Thyroid Function Tests: No results for input(s): TSH, T4TOTAL, FREET4, T3FREE, THYROIDAB in the last 72 hours. Anemia Panel: No results for input(s): VITAMINB12, FOLATE, FERRITIN, TIBC, IRON, RETICCTPCT in the last 72 hours. Sepsis Labs: No results for input(s): PROCALCITON, LATICACIDVEN in the last 168 hours.  Recent Results (from the past 240 hour(s))  MRSA PCR Screening     Status: Abnormal   Collection Time: 10/09/19  9:21 AM   Specimen: Nasal Mucosa; Nasopharyngeal  Result Value Ref Range Status   MRSA by PCR POSITIVE (A) NEGATIVE Final    Comment:        The GeneXpert MRSA Assay (FDA approved for NASAL specimens only), is one component of a comprehensive MRSA colonization surveillance program. It is not intended to diagnose MRSA infection nor to guide or monitor treatment for MRSA infections. RESULT CALLED TO, READ BACK BY AND VERIFIED WITH: ROMINES,H. RN @1531  10/09/19 BILLINGSLEY,L Performed at [facility], [place] [address] [place], [place] [address]          Radiology Studies: CT ABDOMEN WO CONTRAST  Result Date: 10/12/2019 CLINICAL DATA:  Stroke, preop planning for gastrostomy tube EXAM: CT ABDOMEN WITHOUT CONTRAST TECHNIQUE: Multidetector CT imaging of the abdomen was performed following the standard protocol without IV contrast. COMPARISON:  09/22/2019 CT chest FINDINGS: Lower chest: Resolution of pleural effusions. Pleural based nodular consolidation laterally in the left lower lung. Scattered nodular airspace opacities in the basilar segments of the left lower lung. Coarse calcifications in the left breast. Hepatobiliary: No focal liver abnormality is seen. Status post cholecystectomy. No biliary dilatation. Pancreas: Unremarkable. No  pancreatic ductal dilatation or surrounding inflammatory changes. Spleen: Normal in size with focal coarse linear calcification. Adrenals/Urinary Tract: No adrenal mass. Kidneys normal in size. No urolithiasis or hydronephrosis. Stomach/Bowel: Surgical staples near the GE junction. 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 This does not allow percutaneous gastrostomy placement. Visualized small bowel is decompressed. Residual oral contrast material in the visualized colon which is nondilated. Vascular/Lymphatic: Aortic Atherosclerosis (ICD10-170.0). No abdominal or mesenteric adenopathy.  Other: No free air.  No ascites. Musculoskeletal: Mild lumbar levoscoliosis apex L2. Mild T10 and T11 wedge deformities, age indeterminate. No acute fracture or worrisome bone lesion. IMPRESSION: 1. The patient is status post gastric bypass surgery, not a candidate for percutaneous gastrostomy placement. Consider surgical consultation. Electronically Signed   By: Lucrezia Europe M.D.   On: 10/12/2019 15:34   DG CHEST PORT 1 VIEW  Result Date: 10/12/2019 CLINICAL DATA:  Pneumonia. EXAM: PORTABLE CHEST 1 VIEW COMPARISON:  10/10/2019.  CT 09/22/2019. FINDINGS: Mediastinum and hilar structures normal. Heart size normal. Left mid and lower lung infiltrates are again noted without interim change. No pleural effusion or pneumothorax. No pleural effusion or pneumothorax. Surgical clips left chest. Severe degenerative changes left shoulder. Moderate degenerative changes right shoulder. IMPRESSION: Left mid and lower lung infiltrates.  No interim change. Electronically Signed   By: Marcello Moores  Register   On: 10/12/2019 06:28        Scheduled Meds: . chlorhexidine  15 mL Mouth Rinse BID  . Chlorhexidine Gluconate Cloth  6 each Topical Daily  . collagenase   Topical Daily  . donepezil  10 mg Per Tube QHS  . feeding supplement (PRO-STAT SUGAR FREE 64)  30 mL Per Tube Daily  . furosemide   40 mg Intravenous Daily  . Gerhardt's butt cream   Topical BID  . heparin  5,000 Units Subcutaneous Q8H  . mouth rinse  15 mL Mouth Rinse q12n4p  . memantine  10 mg Per Tube BID  . mupirocin ointment  1 application Nasal BID  . sennosides  5 mL Per Tube BID   Continuous Infusions: . sodium chloride Stopped (10/10/19 0257)  . ampicillin-sulbactam (UNASYN) IV 3 g (10/13/19 1056)  . dextrose 5% lactated ringers 75 mL/hr at 10/13/19 0818  . feeding supplement (OSMOLITE 1.2 CAL) Stopped (10/09/19 0200)  . vancomycin Stopped (10/12/19 1337)     LOS: 27 days    Time spent: 30 minutes     [name], MD [facility] Pager [phone]

## 2019-10-13 NOTE — Progress Notes (Signed)
I spoke to the patient's husband and informed him of his wife's desire to not have a feeding tube.  We discussed her lucidity from yesterday through today and that even with dementia people can have periods of lucidity.  She was adamant with myself yesterday as well as Dr. Kae Heller today that she did NOT want a g-tube.  He stated that he, his daughter, and son all wanted the tube.  I discussed that we were not comfortable moving forward with placing a tube in a patient that is currently lucid and very clear that she does not want this procedure done.  Please see Dr. Ron Parker attestation from yesterday on my consult note for further details.  Barbara Flowers 10:57 AM 10/13/2019

## 2019-10-13 NOTE — Progress Notes (Signed)
SLP reviewed note from surgery group.  At this time, given pt's neurological history and pt not desiring PEG but wanting chest compressions, would recommend to conduct MBS *it pf can be positioned adequately* .    In light of pt's wishes not aligning with family desires (per surgery notes), instrumental evaluation will provide insight/proper evaluation of dysphagia given possible silent nature that may not result in clinical symptoms.    If MDs and family desire, please order MBS.  Thanks.   Kathleen Lime, MS [place] Office [phone]

## 2019-10-14 LAB — GLUCOSE, CAPILLARY
Glucose-Capillary: 108 mg/dL — ABNORMAL HIGH (ref 70–99)
Glucose-Capillary: 103 mg/dL — ABNORMAL HIGH (ref 70–99)
Glucose-Capillary: 104 mg/dL — ABNORMAL HIGH (ref 70–99)
Glucose-Capillary: 104 mg/dL — ABNORMAL HIGH (ref 70–99)
Glucose-Capillary: 135 mg/dL — ABNORMAL HIGH (ref 70–99)
Glucose-Capillary: 118 mg/dL — ABNORMAL HIGH (ref 70–99)

## 2019-10-14 LAB — CBC WITH DIFFERENTIAL/PLATELET
Abs Immature Granulocytes: 0.2 10*3/uL — ABNORMAL HIGH (ref 0.00–0.07)
Basophils Absolute: 0.1 10*3/uL (ref 0.0–0.1)
Basophils Relative: 1 %
Eosinophils Absolute: 0.2 10*3/uL (ref 0.0–0.5)
Eosinophils Relative: 2 %
HCT: 30.4 % — ABNORMAL LOW (ref 36.0–46.0)
Hemoglobin: 9.1 g/dL — ABNORMAL LOW (ref 12.0–15.0)
Immature Granulocytes: 2 %
Lymphocytes Relative: 25 %
Lymphs Abs: 2.7 10*3/uL (ref 0.7–4.0)
MCH: 29 pg (ref 26.0–34.0)
MCHC: 29.9 g/dL — ABNORMAL LOW (ref 30.0–36.0)
MCV: 96.8 fL (ref 80.0–100.0)
Monocytes Absolute: 0.8 10*3/uL (ref 0.1–1.0)
Monocytes Relative: 8 %
Neutro Abs: 6.8 10*3/uL (ref 1.7–7.7)
Neutrophils Relative %: 62 %
Platelets: 278 10*3/uL (ref 150–400)
RBC: 3.14 MIL/uL — ABNORMAL LOW (ref 3.87–5.11)
RDW: 14.3 % (ref 11.5–15.5)
WBC: 10.7 10*3/uL — ABNORMAL HIGH (ref 4.0–10.5)
nRBC: 0 % (ref 0.0–0.2)

## 2019-10-14 LAB — COMPREHENSIVE METABOLIC PANEL
ALT: 22 U/L (ref 0–44)
AST: 16 U/L (ref 15–41)
Albumin: 2.4 g/dL — ABNORMAL LOW (ref 3.5–5.0)
Alkaline Phosphatase: 82 U/L (ref 38–126)
Anion gap: 8 (ref 5–15)
BUN: 22 mg/dL (ref 8–23)
CO2: 31 mmol/L (ref 22–32)
Calcium: 8.1 mg/dL — ABNORMAL LOW (ref 8.9–10.3)
Chloride: 101 mmol/L (ref 98–111)
Creatinine, Ser: 0.5 mg/dL (ref 0.44–1.00)
GFR calc Af Amer: >60 mL/min (ref >60)
GFR calc non Af Amer: >60 mL/min (ref >60)
Glucose, Bld: 127 mg/dL — ABNORMAL HIGH (ref 70–99)
Potassium: 4.2 mmol/L (ref 3.5–5.1)
Sodium: 140 mmol/L (ref 135–145)
Total Bilirubin: 0.5 mg/dL (ref 0.3–1.2)
Total Protein: 5.9 g/dL — ABNORMAL LOW (ref 6.5–8.1)

## 2019-10-14 LAB — MAGNESIUM: Magnesium: 2 mg/dL (ref 1.7–2.4)

## 2019-10-14 LAB — PHOSPHORUS: Phosphorus: 3 mg/dL (ref 2.5–4.6)

## 2019-10-14 MED ORDER — POTASSIUM CHLORIDE 20 MEQ PO PACK
20.0000 meq | PACK | Freq: Every day | ORAL | Status: DC
  Administered 2019-10-14 – 2019-10-16 (×3): 20 meq via ORAL
  Filled 2019-10-14 (×3): qty 1

## 2019-10-14 NOTE — Progress Notes (Signed)
Pharmacy Antibiotic Note  Barbara Flowers is a 69 y.o. female admitted on 09/16/2019 with pneumonia.  Pharmacy has been consulted for Unasyn and vancomycin dosing for aspiration PNA.  Day #6 of abx for aspiration PNA event. MD plans for 1 week course. Afebrile, WBC wnl.   Plan: Unasyn 3 gm IV q6h Vancomycin 1 gm IV q24  Added stop dates after tomorrow for total of 1 week course  Height: 5\' 8"  (172.7 cm) Weight: 131 lb 2.8 oz (59.5 kg) IBW/kg (Calculated) : 63.9  Temp (24hrs), Avg:98 F (36.7 C), Min:97.8 F (36.6 C), Max:98.6 F (37 C)  Recent Labs  Lab 10/10/19 0500 10/11/19 0500 10/12/19 0430 10/13/19 0149 10/14/19 0706  WBC 17.0* 11.7* 9.9 11.0* 10.7*  CREATININE 0.65 0.60 0.51 0.49 0.50    Estimated Creatinine Clearance: 62.3 mL/min (by C-G formula based on SCr of 0.5 mg/dL).    Allergies  Allergen Reactions  . Gabapentin Other (See Comments)    seizure  . Tramadol Other (See Comments)    Brings on Seizures    Elenor Quinones, PharmD, BCPS, BCIDP Clinical Pharmacist 10/14/2019 9:26 AM

## 2019-10-14 NOTE — Progress Notes (Signed)
PT HYDROTHERAPY TREATMENT   10/14/19 1200  Subjective Assessment  Subjective are you done?  Patient and Family Stated Goals none stated  Date of Onset  (present on admission)  Evaluation and Treatment  Evaluation and Treatment Procedures Explained to Patient/Family Yes  Evaluation and Treatment Procedures Patient unable to consent due to mental status  Pressure Injury 09/17/19 Sacrum Medial;Left Unstageable - Full thickness tissue loss in which the base of the injury is covered by slough (yellow, tan, gray, green or brown) and/or eschar (tan, brown or black) in the wound bed. wound w eschar  Date First Assessed/Time First Assessed: 09/17/19 0245   Location: Sacrum  Location Orientation: Medial;Left  Staging: Unstageable - Full thickness tissue loss in which the base of the injury is covered by slough (yellow, tan, gray, green or brown) an...  Dressing Type Moist to dry;Foam - Lift dressing to assess site every shift;Gauze (Comment) (santyl)  Dressing Changed  State of Healing Non-healing  Site / Wound Assessment Pink;Red;Yellow;Black  % Wound base Red or Granulating 30%  % Wound base Yellow/Fibrinous Exudate 60%  % Wound base Black/Eschar 10%  Peri-wound Assessment Erythema (non-blanchable);Maceration;Black;Pink  Margins Unattached edges (unapproximated)  Drainage Amount Minimal  Drainage Description Serosanguineous  Treatment Hydrotherapy (Pulse lavage);Packing (Saline gauze);Cleansed;Debridement (Selective)  Hydrotherapy  Pulsed lavage therapy - wound location sacrum  Pulsed Lavage with Suction (psi) 8 psi  Pulsed Lavage with Suction - Normal Saline Used 1000 mL  Pulsed Lavage Tip Tip with splash shield  Selective Debridement  Selective Debridement - Location sacrum  Selective Debridement - Tools Used Forceps;Scissors  Selective Debridement - Tissue Removed black -grey  eschar   Wound Therapy - Assess/Plan/Recommendations  Wound Therapy - Clinical Statement 69 yo female admitted  to hospital with weakness. Hx of CVA with L residual weakness, dementia, Parkinsons, sacral wounds, contractures. WC bound PTA.   Wound Therapy - Functional Problem List CVA with L residual weakness, contractures, Parkinson's  Factors Delaying/Impairing Wound Healing Incontinence;Immobility  Hydrotherapy Plan Debridement;Dressing change;Patient/family education;Pulsatile lavage with suction  Wound Therapy - Frequency 6X / week  Wound Therapy - Current Recommendations WOC nurse;Case manager/social work  Wound Therapy - Follow Up Recommendations Wound Care Center;Skilled nursing facility  Wound Plan Palliative following. Questionable Prognosis. Will plan to continue hydro, dressing changes. Will assess and update recommendations as needed.   Wound Therapy Goals - Improve the function of patient's integumentary system by progressing the wound(s) through the phases of wound healing by:  Decrease Necrotic Tissue to 30  Decrease Necrotic Tissue - Progress Progressing toward goal  Increase Granulation Tissue to 70  Increase Granulation Tissue - Progress Progressing toward goal  Goals/treatment plan/discharge plan were made with and agreed upon by patient/family No, Patient unable to participate in goals/treatment/discharge plan and family unavailable  Time For Goal Achievement 2 weeks  Wound Therapy - Potential for Goals Poor  Baxter Flattery, PT   Acute Rehab Dept [facility]): [phone]   10/14/2019

## 2019-10-14 NOTE — Progress Notes (Signed)
PROGRESS NOTE    Barbara Flowers  [id number] DOB: 1951/03/10 DOA: 09/16/2019 PCP: [name], NP    Brief Narrative:  Patient is here for prolonged period. Previous events and treatments were based upon previous physicians notes and records as below. 69 year old female with history of breast cancer, dementia, history of CVA with left-sided hemiplegia, nursing home resident, wheelchair-bound for last 3 years presented to the emergency room on 09/16/2019 with 1 week of cough, congestion, worsening shortness of breath.  Initially patient was found with extensive left-sided infiltrate and left-sided effusion with hypoxemia corrected with nonrebreather and admitted to ICU.  Events as below: 12/28 thoracentesis 1/2 thora centesis 1/3 intubated and transferred to ICU 1/8 started vanc for MRSA sputum 1/11 not weaning well 1/13 more awake weaning x 2 hrs->spoke w/ family still want full support 1/14 acceptable rapid shallow. Following commands, extubated.   1/15 looks better ready for floor. PCCM sign off.  1/18 Rapid response overnight, suspected ongoing aspiration, copious thick secretions, poor mentation. PCCM re-engaged , back on vancomycin and Unasyn. 1/22 patient is clinically improving, remains with aspiration risk. 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 -Surgery consulted for surgical G-tube placement, patient declined. -NG tube feeding started.   Antibiotics: Rocephin 12/26 >> (14 day course) 1/8 Azithromycin 12/27 > 28 Vancomycin 12/26, 1/7-1/12, 1/18 > Unasyn 1/18 >   Diagnostic data: Covid 19 PCR 12/26 >> neg RVP 12/26 >>neg  Procalcitonin 12/26 >> 2.01, down trended to 0.4 with abx BCx2 12/26 >>Pan sensitive Proteus Mirabilis UC 12/26 >> negative Urine Pneum Ag 12/26 >> positive Respiratory culture 1/6>> MRSA  Assessment & Plan:   Active Problems:   Dementia ([place])   CAP (community  acquired pneumonia)   Pleural effusion on left   Acute prerenal azotemia   Hypernatremia   Pressure injury of skin   Malnutrition of moderate degree   Acute hypoxemic respiratory failure (HCC)   Pleural effusion   Acute systolic heart failure (HCC)   Failure to thrive (0-17)   Palliative care by specialist   Goals of care, counseling/discussion   Generalized weakness  Acute hypoxemic and hypercarbic respiratory failure in the setting of MRSA pneumonia, poor cough clearance and extreme debility: Thoracentesis x2, status post intubation and extubated on 1/14 Now on room air. Continue chest physiotherapy and deep breathing exercises if she can do it. Suspected new aspiration event on 1/18, currently on vancomycin and Unasyn that we will continue for 1 week.  D6/7  high risk of aspiration events.  Acute metabolic encephalopathy with history of underlying dementia: CT head negative.  More responsive and awake now.  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 fib with RVR: EF 25 to 30%.  Intermittent A. fib.  Currently sinus rhythm.  Currently clinically euvolemic.  Occasionally on maintenance dextrose as her blood sugars drop. On IV Lasix along with potassium supplements that we will continue.  Will change to p.o. once.  Changes able to tolerate diet.  Hypokalemia: Severe and persistent.  Aggressively replaced.  Magnesium phosphorus normal.  Continue close monitoring.  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  Seen by surgery.  Bedside debridement.  PT doing hydrotherapy.  Nutrition: Patient has been n.p.o.  Multiple attempts of NG tube placement failed 1/19. Evaluated by speech therapy, unable to safely swallow. IR unable to do PEG tube due to Roux-en-Y. Patient declined to have procedure with surgery. Now on  NG tube feeding.  Tolerating well. Long-term nutrition plan pending as patient declines PEG tube  procedure when family not at the bedside. Fluid: Intermittent dextrose.  Now tolerating diet. Goal of care: Family desired full code including tracheostomy if needed, PEG tube placement.    DVT prophylaxis: Heparin subcu Code Status: Full code Family Communication: Discussed with patient's husband.  Updated. Disposition Plan: Unknown.  Stepdown unit.  Probably home with family after clinical stabilization.   Consultants:   PCCM,  Surgery,  Palliative medicine  Procedures:   Bedside wound debridement, 1/19  Antimicrobials:   See above.  Currently on Unasyn and vancomycin.   Subjective: Seen and examined.  No overnight events.  Patient herself was more responsive.  She was able to keep up communication. Objective: Vitals:   10/14/19 0900 10/14/19 1000 10/14/19 1100 10/14/19 1248  BP: (!) 95/39 (!) 113/45 (!) 128/55   Pulse:  (!) 50    Resp: 15 19 15    Temp:    98.2 F (36.8 C)  TempSrc:    Oral  SpO2:  (!) 85% 100%   Weight:      Height:        Intake/Output Summary (Last 24 hours) at 10/14/2019 1254 Last data filed at 10/14/2019 1122 Gross per 24 hour  Intake 3245.07 ml  Output 850 ml  Net 2395.07 ml   Filed Weights   10/12/19 0400 10/13/19 0413 10/14/19 0420  Weight: 57.8 kg 57.9 kg 59.5 kg    Examination:  General exam: Calm and comfortable, on room air.  Not in any distress.  Chronically sick looking.  Tube feeding infusing. Respiratory system: Bilaterally clear. Cardiovascular system: S1 & S2 heard, regular.  No murmurs. Gastrointestinal system: Abdomen is nondistended, soft and nontender.  Central nervous system: Alert and awake.  Follows simple commands.  Oriented x1.    Extremities: Patient was able to use right hand.  She did not move both her lower extremities.  Left upper extremity is contracted, able to squeeze fingers 2/5 Psychiatry: Judgement and insight appear compromised.  Flat affect. Stage IV sacral decubitus with dressing on    Data  Reviewed: I have personally reviewed following labs and imaging studies  CBC: Recent Labs  Lab 10/10/19 0500 10/11/19 0500 10/12/19 0430 10/13/19 0149 10/14/19 0706  WBC 17.0* 11.7* 9.9 11.0* 10.7*  NEUTROABS  --   --  6.6 7.1 6.8  HGB 8.4* 8.2* 8.2* 8.6* 9.1*  HCT 28.7* 28.1* 27.5* 28.9* 30.4*  MCV 98.6 99.3 97.9 97.6 96.8  PLT 383 300 314 295 0000000   Basic Metabolic Panel: Recent Labs  Lab 10/09/19 0137 10/09/19 1900 10/10/19 0500 10/11/19 0500 10/12/19 0430 10/13/19 0149 10/14/19 0706  NA   < >  --  144 147* 144 142 140  K   < >  --  3.7 2.5* 4.3 4.1 4.2  CL   < >  --  107 108 107 105 101  CO2   < >  --  26 29 27 28 31   GLUCOSE   < >  --  96 88 101* 157* 127*  BUN   < >  --  49* 44* 30* 22 22  CREATININE   < >  --  0.65 0.60 0.51 0.49 0.50  CALCIUM   < >  --  8.5* 8.4* 8.2* 8.0* 8.1*  MG  --  2.3  --   --  1.9 2.3 2.0  PHOS  --   --   --   --  2.3* 4.1 3.0   < > = values in this interval not displayed.   GFR: Estimated Creatinine Clearance: 62.3 mL/min (by C-G formula based on SCr of 0.5 mg/dL). Liver Function Tests: Recent Labs  Lab 10/10/19 0500 10/11/19 0500 10/12/19 0430 10/13/19 0149 10/14/19 0706  AST 37 30 27 24 16   ALT 48* 40 31 28 22   ALKPHOS 98 83 75 76 82  BILITOT 0.8 1.0 1.0 0.6 0.5  PROT 6.6 6.2* 5.8* 5.8* 5.9*  ALBUMIN 2.5* 2.4* 2.3* 2.2* 2.4*   No results for input(s): LIPASE, AMYLASE in the last 168 hours. Recent Labs  Lab 10/09/19 1013  AMMONIA 16   Coagulation Profile: No results for input(s): INR, PROTIME in the last 168 hours. Cardiac Enzymes: No results for input(s): CKTOTAL, CKMB, CKMBINDEX, TROPONINI in the last 168 hours. BNP (last 3 results) No results for input(s): PROBNP in the last 8760 hours. HbA1C: No results for input(s): HGBA1C in the last 72 hours. CBG: Recent Labs  Lab 10/13/19 1648 10/13/19 2011 10/14/19 0006 10/14/19 0426 10/14/19 0742  GLUCAP 75 112* 108* 103* 104*   Lipid Profile: No results for  input(s): CHOL, HDL, LDLCALC, TRIG, CHOLHDL, LDLDIRECT in the last 72 hours. Thyroid Function Tests: No results for input(s): TSH, T4TOTAL, FREET4, T3FREE, THYROIDAB in the last 72 hours. Anemia Panel: No results for input(s): VITAMINB12, FOLATE, FERRITIN, TIBC, IRON, RETICCTPCT in the last 72 hours. Sepsis Labs: No results for input(s): PROCALCITON, LATICACIDVEN in the last 168 hours.  Recent Results (from the past 240 hour(s))  MRSA PCR Screening     Status: Abnormal   Collection Time: 10/09/19  9:21 AM   Specimen: Nasal Mucosa; Nasopharyngeal  Result Value Ref Range Status   MRSA by PCR POSITIVE (A) NEGATIVE Final    Comment:        The GeneXpert MRSA Assay (FDA approved for NASAL specimens only), is one component of a comprehensive MRSA colonization surveillance program. It is not intended to diagnose MRSA infection nor to guide or monitor treatment for MRSA infections. RESULT CALLED TO, READ BACK BY AND VERIFIED WITH: ROMINES,H. RN @1531  10/09/19 BILLINGSLEY,L Performed at [facility], [place] [address] [place], [place] [address]          Radiology Studies: DG Abd 1 View  Result Date: 10/13/2019 CLINICAL DATA:  Nasogastric tube positioning. EXAM: ABDOMEN - 1 VIEW COMPARISON:  October 07, 2019 FINDINGS: A nasogastric tube is seen with its distal tip noted within the distal esophagus, near the level of the gastroesophageal junction. The bowel gas pattern is normal. No radio-opaque calculi or other significant radiographic abnormality are seen. Radiopaque surgical clips are seen overlying the right upper quadrant with additional radiopaque surgical clips and surgical sutures seen overlying the left upper quadrant. IMPRESSION: 1. Nasogastric tube positioning, as described above. Further advancement of the NG tube by approximately 6 cm is recommended to decrease the risk of aspiration. Electronically Signed   By: Virgina Norfolk M.D.   On: 10/13/2019 16:52    DG Abd Portable 1V  Result Date: 10/13/2019 CLINICAL DATA:  NG tube advancement, third attempt. EXAM: PORTABLE ABDOMEN - 1 VIEW COMPARISON:  Radiographs earlier this day, CT 10/12/2019 FINDINGS: Tip of the enteric tube below the diaphragm in the region of enteric sutures, side-port is at the gastroesophageal junction. Enteric sutures in the upper abdomen. No bowel dilatation to suggest obstruction. IMPRESSION: Tip of the enteric tube below the diaphragm, abutting enteric sutures, likely at the gastrojejunostomy junction given history  of gastric bypass. Side port is in the region of the gastroesophageal junction. Electronically Signed   By: Keith Rake M.D.   On: 10/13/2019 19:05   DG Abd Portable 1V  Result Date: 10/13/2019 CLINICAL DATA:  NG tube advancement. EXAM: PORTABLE ABDOMEN - 1 VIEW COMPARISON:  October 13, 2019 FINDINGS: A nasogastric tube is seen with its distal tip noted just beyond the gastroesophageal junction. The bowel gas pattern is normal. No radio-opaque calculi or other significant radiographic abnormality are seen. Radiopaque surgical clips are seen overlying the right upper quadrant with additional surgical sutures and surgical clips seen along the lateral aspect of the left upper quadrant. IMPRESSION: Nasogastric tube positioning, as described above. Further advancement of the NG tube by approximately 6 cm is recommended to decrease the risk of aspiration. Electronically Signed   By: Virgina Norfolk M.D.   On: 10/13/2019 16:51   DG Swallowing Func-Speech Pathology  Result Date: 10/13/2019 Objective Swallowing Evaluation: Type of Study: MBS-Modified Barium Swallow Study  Patient Details Name: Tahje Walkowski MRN: [id number] Date of Birth: 03/23/1951 Today's Date: 10/13/2019 Time: SLP Start Time (ACUTE ONLY): 1145 -SLP Stop Time (ACUTE ONLY): 1210 SLP Time Calculation (min) (ACUTE ONLY): 25 min Past Medical History: Past Medical History: Diagnosis Date . Anemia  . Anemia  .  Anxiety disorder  . Blood transfusion without reported diagnosis  . Brain cancer [facility])   s/p sterotactic radio surgery . Brain tumor ([place])  . Breast cancer ([place])  . Cancer ([place]) 1990  L breast . H/O bone marrow transplant ([place])  . Hypothyroidism  . IBS (irritable bowel syndrome)  . MDD (major depressive disorder)  . Memory loss  . Obesity  . Orthostatic hypotension  . Seizure ([place])  . Seizures ([place])  . Stroke [facility]) 2005  hemorrhagic . Stroke ([place])  . Subdural hematoma ([place])  . Upper brachial plexus paralysis syndrome  . Urinary incontinence  Past Surgical History: Past Surgical History: Procedure Laterality Date . BREAST LUMPECTOMY Left   post chemotherapy and radiation . CHOLECYSTECTOMY   . CRANIOTOMY FOR HEMISPHERECTOMY TOTAL / PARTIAL Right  . GASTRIC BYPASS  1985 . HAND SURGERY Left 2015 . HAND TENDON SURGERY Left   for brachial plexus injury . LYMPH NODE BIOPSY   HPI: Ms. Onawa Inglis, 68y/f, presented to ER after 1 week of cough/congestion, poor intake, AMS and weakness. PMH of Breast CA (early 1990's), sundowning/dementia w/c bound x 3 years post CVA, anemia, anxiety disorder, Brain tumor/cancer, hypothyroidism, IBS< Memory loss, hypotension, seizures, upper brachial plexus paralysis syndrome. Prior swallow evaluations 2016 and 2018 both finding normal oropharyngeal swallow function.  Clinical swallow eval on 09/21/2019 done with recommendations for npo x ice chips after oral care.  Pt had NG tube in place at that time.  Xerostomia, poor oral movement of ice chip noted - cough after swallow of ice strong enought to propel secretions into oral cavity to allow suction.  Pt later developed respiratory issues and required intubation (oral intubation) 1/3-.10/07/2019.  Concern for mucus plugging and aspiration noted - most recent CXR with lower lung improving but LUL airspace dx present.   Per [facility], events occured as follows:  " 12/28 thora centesis, 1/2 thora centesis, 1/3 intubated and transferred to [place]  started vanc for MRSA sputum1/11 not weaning well1/13 more awake weaning x 2 hrs->spoke w/ family still want full support1/14 acceptable rapid shallow. Following commands, extubated.  1/15 looks better ready for floor. PCCM sign off. 1/18 Rapid response overnight, suspected ongoing aspiration, copious  thick secretions, poor mentation. PCCM re-engaged , back on vancomycin and Unasyn." NG out 1/19 - swallow eval ordered.  Family desires full code for this pt per notes.  She has been receiving chest PT, WBC elevated to 11.7.  Plan was for pt to received PEG but pt verbalized she did not desire this and MBS ordered to determine if pt could tolerate po.  Subjective: alert, inconsistenly follows commands Assessment / Plan / Recommendation CHL IP CLINICAL IMPRESSIONS 10/13/2019 Clinical Impression Suboptimal view due to pt's kyphosis and side lying position but tested to best ability.   Patient present with moderately severe oropharyngeal dysphagia with sensorimotor deficits. Lingual pumping with premature spillage and impaired bolus cohesion due to pt's weakness/disoordination.  With delays she often reflexively swallows to help clear oral residuals but at times requires cues to swallow.  Pt penetration and aspirated with thin and nectar consistencies due both during swallow *due to decreased laryngeal closure* and after *pyriform sinus residuals spilling into open airway (with further swallow to clear oral residual).  Aspiration at times was silent *trace amount* and other times with reflexive cough *larger amount* mixed with secretions. Reflexive cough did not clear aspirates and was very weak.  Pharyngeal retention noted without pt adequate sensation.  Cued "hock" did not clear pharyngeal retention. Chin tuck attemptd under flouro but pt unable to perform adequately. Due to pt's level of dysphagia and gross weakness, she will likely aspirate with po provided.  However also suspect she is aspirating secretions based on  secretion retention on MBS.  If family, pt and MD desire for pt to consume po with aspiration mitigation, recommend to consider nectar thick liquids (Ensures, Glucerna, etc) - would advise against anything of increased viscocity.  Tsps of water after mouth care advised to enhance oral hygeine and pt comfort.  Being able to optain nutritional support via po alone will be very challenging for this pt due to her level of dysphagia.   SLP Visit Diagnosis Dysphagia, oropharyngeal phase (R13.12) Attention and concentration deficit following -- Frontal lobe and executive function deficit following -- Impact on safety and function Risk for inadequate nutrition/hydration;Severe aspiration risk   CHL IP TREATMENT RECOMMENDATION 10/13/2019 Treatment Recommendations Therapy as outlined in treatment plan below   Prognosis 10/13/2019 Prognosis for Safe Diet Advancement Fair Barriers to Reach Goals Cognitive deficits;Severity of deficits;Time post onset Barriers/Prognosis Comment -- CHL IP DIET RECOMMENDATION 10/13/2019 SLP Diet Recommendations (No Data) Liquid Administration via -- Medication Administration Via alternative means Compensations Slow rate;Small sips/bites;Multiple dry swallows after each bite/sip Postural Changes Seated upright at 90 degrees;Remain semi-upright after after feeds/meals (Comment)   CHL IP OTHER RECOMMENDATIONS 10/13/2019 Recommended Consults -- Oral Care Recommendations Oral care QID Other Recommendations --   CHL IP FOLLOW UP RECOMMENDATIONS 10/13/2019 Follow up Recommendations (No Data)   CHL IP FREQUENCY AND DURATION 10/13/2019 Speech Therapy Frequency (ACUTE ONLY) min 2x/week Treatment Duration 2 weeks      CHL IP ORAL PHASE 10/13/2019 Oral Phase Impaired Oral - Pudding Teaspoon -- Oral - Pudding Cup -- Oral - Honey Teaspoon Lingual pumping;Reduced posterior propulsion;Lingual/palatal residue;Delayed oral transit;Weak lingual manipulation;Decreased bolus cohesion;Premature spillage Oral - Honey Cup --  Oral - Nectar Teaspoon Lingual pumping;Reduced posterior propulsion;Weak lingual manipulation;Delayed oral transit;Lingual/palatal residue;Decreased bolus cohesion;Premature spillage Oral - Nectar Cup -- Oral - Nectar Straw Lingual pumping;Lingual/palatal residue;Reduced posterior propulsion;Weak lingual manipulation;Delayed oral transit;Premature spillage;Decreased bolus cohesion Oral - Thin Teaspoon Lingual pumping;Premature spillage;Lingual/palatal residue;Reduced posterior propulsion;Weak lingual manipulation;Delayed oral transit;Decreased bolus cohesion Oral - Thin  Cup -- Oral - Thin Straw Lingual pumping;Premature spillage;Lingual/palatal residue;Reduced posterior propulsion;Weak lingual manipulation;Delayed oral transit;Decreased bolus cohesion Oral - Puree Lingual pumping;Premature spillage;Lingual/palatal residue;Reduced posterior propulsion;Weak lingual manipulation;Delayed oral transit;Decreased bolus cohesion Oral - Mech Soft -- Oral - Regular -- Oral - Multi-Consistency -- Oral - Pill -- Oral Phase - Comment --  CHL IP PHARYNGEAL PHASE 10/13/2019 Pharyngeal Phase Impaired Pharyngeal- Pudding Teaspoon -- Pharyngeal -- Pharyngeal- Pudding Cup -- Pharyngeal -- Pharyngeal- Honey Teaspoon Delayed swallow initiation-vallecula;Reduced epiglottic inversion;Penetration/Aspiration during swallow;Pharyngeal residue - pyriform;Pharyngeal residue - valleculae Pharyngeal Material enters airway, remains ABOVE vocal cords and not ejected out Pharyngeal- Honey Cup -- Pharyngeal -- Pharyngeal- Nectar Teaspoon Delayed swallow initiation-pyriform sinuses;Pharyngeal residue - pyriform;Penetration/Aspiration during swallow;Pharyngeal residue - valleculae;Reduced laryngeal elevation;Reduced airway/laryngeal closure Pharyngeal Material enters airway, passes BELOW cords and not ejected out despite cough attempt by patient;Material enters airway, passes BELOW cords without attempt by patient to eject out (silent aspiration)  Pharyngeal- Nectar Cup -- Pharyngeal -- Pharyngeal- Nectar Straw Delayed swallow initiation-pyriform sinuses;Delayed swallow initiation-vallecula;Pharyngeal residue - valleculae;Pharyngeal residue - pyriform;Reduced laryngeal elevation;Reduced airway/laryngeal closure Pharyngeal Material enters airway, passes BELOW cords without attempt by patient to eject out (silent aspiration);Material enters airway, passes BELOW cords and not ejected out despite cough attempt by patient Pharyngeal- Thin Teaspoon Penetration/Aspiration during swallow;Reduced airway/laryngeal closure;Reduced laryngeal elevation;Delayed swallow initiation-pyriform sinuses;Pharyngeal residue - valleculae;Pharyngeal residue - pyriform Pharyngeal Material enters airway, passes BELOW cords and not ejected out despite cough attempt by patient Pharyngeal- Thin Cup -- Pharyngeal -- Pharyngeal- Thin Straw Penetration/Aspiration during swallow;Reduced laryngeal elevation;Delayed swallow initiation-pyriform sinuses;Pharyngeal residue - pyriform Pharyngeal Material enters airway, passes BELOW cords and not ejected out despite cough attempt by patient Pharyngeal- Puree Delayed swallow initiation-vallecula;Reduced laryngeal elevation;Reduced tongue base retraction;Pharyngeal residue - valleculae;Pharyngeal residue - pyriform Pharyngeal Material does not enter airway Pharyngeal- Mechanical Soft -- Pharyngeal -- Pharyngeal- Regular -- Pharyngeal -- Pharyngeal- Multi-consistency -- Pharyngeal -- Pharyngeal- Pill -- Pharyngeal -- Pharyngeal Comment --  CHL IP CERVICAL ESOPHAGEAL PHASE 10/13/2019 Cervical Esophageal Phase (No Data) Pudding Teaspoon -- Pudding Cup -- Honey Teaspoon -- Honey Cup -- Nectar Teaspoon -- Nectar Cup -- Nectar Straw -- Thin Teaspoon -- Thin Cup -- Thin Straw -- Puree -- Mechanical Soft -- Regular -- Multi-consistency -- Pill -- Cervical Esophageal Comment -- [name] 10/13/2019, 1:06 PM    Kathleen Lime, MS [facility] SLP Acute Rehab Services  Office [phone]                Scheduled Meds: . chlorhexidine  15 mL Mouth Rinse BID  . Chlorhexidine Gluconate Cloth  6 each Topical Daily  . collagenase   Topical Daily  . donepezil  10 mg Per Tube QHS  . feeding supplement (PRO-STAT SUGAR FREE 64)  30 mL Per Tube Daily  . furosemide  40 mg Intravenous Daily  . Gerhardt's butt cream   Topical BID  . heparin  5,000 Units Subcutaneous Q8H  . mouth rinse  15 mL Mouth Rinse q12n4p  . memantine  10 mg Per Tube BID  . potassium chloride  20 mEq Oral Daily  . sennosides  5 mL Per Tube BID   Continuous Infusions: . sodium chloride Stopped (10/10/19 0257)  . ampicillin-sulbactam (UNASYN) IV Stopped (10/14/19 1100)  . feeding supplement (OSMOLITE 1.2 CAL) 1,000 mL (10/14/19 1037)  . vancomycin 200 mL/hr at 10/14/19 1122     LOS: 28 days    Time spent: 30 minutes     [name], MD [facility] Pager [phone]

## 2019-10-15 ENCOUNTER — Inpatient Hospital Stay (HOSPITAL_COMMUNITY): Payer: Medicare Other

## 2019-10-15 LAB — COMPREHENSIVE METABOLIC PANEL
ALT: 21 U/L (ref 0–44)
AST: 18 U/L (ref 15–41)
Albumin: 2.4 g/dL — ABNORMAL LOW (ref 3.5–5.0)
Alkaline Phosphatase: 77 U/L (ref 38–126)
Anion gap: 9 (ref 5–15)
BUN: 29 mg/dL — ABNORMAL HIGH (ref 8–23)
CO2: 28 mmol/L (ref 22–32)
Calcium: 8.2 mg/dL — ABNORMAL LOW (ref 8.9–10.3)
Chloride: 102 mmol/L (ref 98–111)
Creatinine, Ser: 0.54 mg/dL (ref 0.44–1.00)
GFR calc Af Amer: >60 mL/min (ref >60)
GFR calc non Af Amer: >60 mL/min (ref >60)
Glucose, Bld: 130 mg/dL — ABNORMAL HIGH (ref 70–99)
Potassium: 4.7 mmol/L (ref 3.5–5.1)
Sodium: 139 mmol/L (ref 135–145)
Total Bilirubin: 0.6 mg/dL (ref 0.3–1.2)
Total Protein: 5.9 g/dL — ABNORMAL LOW (ref 6.5–8.1)

## 2019-10-15 LAB — CBC WITH DIFFERENTIAL/PLATELET
Abs Immature Granulocytes: 0.22 10*3/uL — ABNORMAL HIGH (ref 0.00–0.07)
Basophils Absolute: 0.1 10*3/uL (ref 0.0–0.1)
Basophils Relative: 1 %
Eosinophils Absolute: 0.1 10*3/uL (ref 0.0–0.5)
Eosinophils Relative: 1 %
HCT: 29.5 % — ABNORMAL LOW (ref 36.0–46.0)
Hemoglobin: 8.8 g/dL — ABNORMAL LOW (ref 12.0–15.0)
Immature Granulocytes: 2 %
Lymphocytes Relative: 18 %
Lymphs Abs: 2.6 10*3/uL (ref 0.7–4.0)
MCH: 28.7 pg (ref 26.0–34.0)
MCHC: 29.8 g/dL — ABNORMAL LOW (ref 30.0–36.0)
MCV: 96.1 fL (ref 80.0–100.0)
Monocytes Absolute: 1.1 10*3/uL — ABNORMAL HIGH (ref 0.1–1.0)
Monocytes Relative: 7 %
Neutro Abs: 10.3 10*3/uL — ABNORMAL HIGH (ref 1.7–7.7)
Neutrophils Relative %: 71 %
Platelets: 311 10*3/uL (ref 150–400)
RBC: 3.07 MIL/uL — ABNORMAL LOW (ref 3.87–5.11)
RDW: 14.5 % (ref 11.5–15.5)
WBC: 14.4 10*3/uL — ABNORMAL HIGH (ref 4.0–10.5)
nRBC: 0 % (ref 0.0–0.2)

## 2019-10-15 LAB — GLUCOSE, CAPILLARY
Glucose-Capillary: 147 mg/dL — ABNORMAL HIGH (ref 70–99)
Glucose-Capillary: 122 mg/dL — ABNORMAL HIGH (ref 70–99)
Glucose-Capillary: 97 mg/dL (ref 70–99)
Glucose-Capillary: 151 mg/dL — ABNORMAL HIGH (ref 70–99)
Glucose-Capillary: 112 mg/dL — ABNORMAL HIGH (ref 70–99)
Glucose-Capillary: 117 mg/dL — ABNORMAL HIGH (ref 70–99)
Glucose-Capillary: 117 mg/dL — ABNORMAL HIGH (ref 70–99)

## 2019-10-15 LAB — PHOSPHORUS: Phosphorus: 3.5 mg/dL (ref 2.5–4.6)

## 2019-10-15 LAB — MAGNESIUM: Magnesium: 2.1 mg/dL (ref 1.7–2.4)

## 2019-10-15 NOTE — Progress Notes (Signed)
Found patient with hand grasped around NG tube and tube pulled out 3 inches.  Tube advanced and re-taped.  Updated NP [place].  Will get KUB before restarting tube feeds.

## 2019-10-15 NOTE — Progress Notes (Signed)
PROGRESS NOTE    Barbara Flowers  [id number] DOB: 1951/04/14 DOA: 09/16/2019 PCP: [name], NP    Brief Narrative:  Patient is here for prolonged period. Previous events and treatments were based upon previous physicians notes and records as below. 69 year old female with history of breast cancer, dementia, history of CVA with left-sided hemiplegia, nursing home resident, wheelchair-bound for last 3 years presented to the emergency room on 09/16/2019 with 1 week of cough, congestion, worsening shortness of breath.  Initially patient was found with extensive left-sided infiltrate and left-sided effusion with hypoxemia corrected with nonrebreather and admitted to ICU.  Events as below: 12/28 thoracentesis 1/2 thora centesis 1/3 intubated and transferred to ICU 1/8 started vanc for MRSA sputum 1/11 not weaning well 1/13 more awake weaning x 2 hrs->spoke w/ family still want full support 1/14 acceptable rapid shallow. Following commands, extubated.   1/15 looks better ready for floor. PCCM sign off.  1/18 Rapid response overnight, suspected ongoing aspiration, copious thick secretions, poor mentation. PCCM re-engaged , back on vancomycin and Unasyn. 1/22 patient is clinically improving, remains with aspiration risk. 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 -Surgery consulted for surgical G-tube placement, patient declined so held  - NG tube feeding started.   Antibiotics: Rocephin 12/26 >> (14 day course) 1/8 Azithromycin 12/27 > 28 Vancomycin 12/26, 1/7-1/12, 1/18 > 1/24 Unasyn 1/18 > 1/24  Diagnostic data: Covid 19 PCR 12/26 >> neg RVP 12/26 >>neg  Procalcitonin 12/26 >> 2.01, down trended to 0.4 with abx BCx2 12/26 >>Pan sensitive Proteus Mirabilis UC 12/26 >> negative Urine Pneum Ag 12/26 >> positive Respiratory culture 1/6>> MRSA  Assessment & Plan:   Active Problems:   Dementia ([place])    CAP (community acquired pneumonia)   Pleural effusion on left   Acute prerenal azotemia   Hypernatremia   Pressure injury of skin   Malnutrition of moderate degree   Acute hypoxemic respiratory failure (HCC)   Pleural effusion   Acute systolic heart failure (HCC)   Failure to thrive (0-17)   Palliative care by specialist   Goals of care, counseling/discussion   Generalized weakness  Acute hypoxemic and hypercarbic respiratory failure in the setting of MRSA pneumonia, poor cough clearance and extreme debility: Thoracentesis x2, status post intubation and extubated on 1/14 Now on room air. Continue chest physiotherapy and deep breathing exercises if she can do it. Suspected new aspiration event on 1/18, currently on vancomycin and Unasyn that we will continue for 1 week day 7/7. high risk of aspiration events.  Acute metabolic encephalopathy with history of underlying dementia: CT head negative.  Slightly more responsive and awake now.  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 fib with RVR: EF 25 to 30%.  Intermittent A. fib.    Hypokalemia: Severe and persistent.  On replacement along with potassium. Continue close monitoring.  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  Seen by surgery.  Bedside debridement.  PT doing hydrotherapy.  Nutrition: Patient has been n.p.o.  Multiple attempts of NG tube placement failed 1/19. Evaluated by speech therapy, unable to safely swallow. IR unable to do PEG tube due to Roux-en-Y. Patient declined to have procedure with surgery. Now on NG tube feeding.  Tolerating well. Long-term nutrition plan pending as patient declines PEG tube procedure when family not at the bedside. Fluid: Intermittent dextrose.  Now tolerating tube feeding. Goal of care: Family desired full  code including tracheostomy if needed, PEG tube placement.    DVT prophylaxis: Heparin subcu Code Status:  Full code Family Communication: will meet with patient's husband.  Disposition Plan: from home . No feeding means now. Probably home with family after clinical stabilization.   Consultants:   PCCM,  Surgery,  Palliative medicine  Procedures:   Bedside wound debridement, 1/19  Antimicrobials:   See above.  Currently on Unasyn and vancomycin.   Subjective: Seen and examined. KUB confirmed position of tube  No other overnight events.  Objective: Vitals:   10/15/19 0700 10/15/19 0800 10/15/19 0826 10/15/19 0900  BP: (!) 146/63 (!) 146/63  (!) 141/53  Pulse: 83 (!) 29  85  Resp: 19 (!) 23  (!) 21  Temp:   99.5 F (37.5 C)   TempSrc:   Axillary   SpO2: 93% 98%  95%  Weight:      Height:        Intake/Output Summary (Last 24 hours) at 10/15/2019 0939 Last data filed at 10/15/2019 0926 Gross per 24 hour  Intake 2036.56 ml  Output 2375 ml  Net -338.44 ml   Filed Weights   10/13/19 0413 10/14/19 0420 10/15/19 0500  Weight: 57.9 kg 59.5 kg 59.3 kg    Examination:  General exam: Calm and comfortable, on room air.  Not in any distress.  Chronically sick looking.  Tube feeding infusing. Respiratory system: Bilaterally clear. Cardiovascular system: S1 & S2 heard, regular.  No murmurs. Gastrointestinal system: Abdomen is nondistended, soft and nontender.  Central nervous system: Alert and awake.  Follows simple commands.  Oriented x1.    Extremities: Patient was able to use right hand.  She did not move both her lower extremities. They are contracted. Left upper extremity is contracted, able to squeeze fingers 2/5 Psychiatry: Judgement and insight appear compromised.  Flat affect. Stage IV left sacral decubitus with dressing on    Data Reviewed: I have personally reviewed following labs and imaging studies  CBC: Recent Labs  Lab 10/11/19 0500 10/12/19 0430 10/13/19 0149 10/14/19 0706 10/15/19 0220  WBC 11.7* 9.9 11.0* 10.7* 14.4*  NEUTROABS  --  6.6 7.1 6.8  10.3*  HGB 8.2* 8.2* 8.6* 9.1* 8.8*  HCT 28.1* 27.5* 28.9* 30.4* 29.5*  MCV 99.3 97.9 97.6 96.8 96.1  PLT 300 314 295 278 [id number]   Basic Metabolic Panel: Recent Labs  Lab 10/09/19 1900 10/10/19 0500 10/11/19 0500 10/12/19 0430 10/13/19 0149 10/14/19 0706 10/15/19 0220  NA  --    < > 147* 144 142 140 139  K  --    < > 2.5* 4.3 4.1 4.2 4.7  CL  --    < > 108 107 105 101 102  CO2  --    < > 29 27 28 31 28   GLUCOSE  --    < > 88 101* 157* 127* 130*  BUN  --    < > 44* 30* 22 22 29*  CREATININE  --    < > 0.60 0.51 0.49 0.50 0.54  CALCIUM  --    < > 8.4* 8.2* 8.0* 8.1* 8.2*  MG 2.3  --   --  1.9 2.3 2.0 2.1  PHOS  --   --   --  2.3* 4.1 3.0 3.5   < > = values in this interval not displayed.   GFR: Estimated Creatinine Clearance: 62.1 mL/min (by C-G formula based on SCr of 0.54 mg/dL). Liver Function Tests: Recent Labs  Lab 10/11/19  0500 10/12/19 0430 10/13/19 0149 10/14/19 0706 10/15/19 0220  AST 30 27 24 16 18   ALT 40 31 28 22 21   ALKPHOS 83 75 76 82 77  BILITOT 1.0 1.0 0.6 0.5 0.6  PROT 6.2* 5.8* 5.8* 5.9* 5.9*  ALBUMIN 2.4* 2.3* 2.2* 2.4* 2.4*   No results for input(s): LIPASE, AMYLASE in the last 168 hours. Recent Labs  Lab 10/09/19 1013  AMMONIA 16   Coagulation Profile: No results for input(s): INR, PROTIME in the last 168 hours. Cardiac Enzymes: No results for input(s): CKTOTAL, CKMB, CKMBINDEX, TROPONINI in the last 168 hours. BNP (last 3 results) No results for input(s): PROBNP in the last 8760 hours. HbA1C: No results for input(s): HGBA1C in the last 72 hours. CBG: Recent Labs  Lab 10/14/19 1510 10/14/19 1947 10/14/19 2328 10/15/19 0309 10/15/19 0744  GLUCAP 104* 135* 118* 122* 97   Lipid Profile: No results for input(s): CHOL, HDL, LDLCALC, TRIG, CHOLHDL, LDLDIRECT in the last 72 hours. Thyroid Function Tests: No results for input(s): TSH, T4TOTAL, FREET4, T3FREE, THYROIDAB in the last 72 hours. Anemia Panel: No results for input(s):  VITAMINB12, FOLATE, FERRITIN, TIBC, IRON, RETICCTPCT in the last 72 hours. Sepsis Labs: No results for input(s): PROCALCITON, LATICACIDVEN in the last 168 hours.  Recent Results (from the past 240 hour(s))  MRSA PCR Screening     Status: Abnormal   Collection Time: 10/09/19  9:21 AM   Specimen: Nasal Mucosa; Nasopharyngeal  Result Value Ref Range Status   MRSA by PCR POSITIVE (A) NEGATIVE Final    Comment:        The GeneXpert MRSA Assay (FDA approved for NASAL specimens only), is one component of a comprehensive MRSA colonization surveillance program. It is not intended to diagnose MRSA infection nor to guide or monitor treatment for MRSA infections. RESULT CALLED TO, READ BACK BY AND VERIFIED WITH: ROMINES,H. RN @1531  10/09/19 BILLINGSLEY,L Performed at [facility], [place] [address], [place], [place] [address]          Radiology Studies: DG Abd 1 View  Result Date: 10/15/2019 CLINICAL DATA:  NG tube placement EXAM: ABDOMEN - 1 VIEW COMPARISON:  10/13/2019 FINDINGS: Enteric tube terminates in the mid gastric body. Surgical sutures in the left upper/mid abdomen. Cholecystectomy clips. Nonobstructive bowel gas pattern. IMPRESSION: Enteric tube terminates in the mid gastric body. Electronically Signed   By: Julian Hy M.D.   On: 10/15/2019 06:04   DG Abd 1 View  Result Date: 10/13/2019 CLINICAL DATA:  Nasogastric tube positioning. EXAM: ABDOMEN - 1 VIEW COMPARISON:  October 07, 2019 FINDINGS: A nasogastric tube is seen with its distal tip noted within the distal esophagus, near the level of the gastroesophageal junction. The bowel gas pattern is normal. No radio-opaque calculi or other significant radiographic abnormality are seen. Radiopaque surgical clips are seen overlying the right upper quadrant with additional radiopaque surgical clips and surgical sutures seen overlying the left upper quadrant. IMPRESSION: 1. Nasogastric tube positioning, as described  above. Further advancement of the NG tube by approximately 6 cm is recommended to decrease the risk of aspiration. Electronically Signed   By: Virgina Norfolk M.D.   On: 10/13/2019 16:52   DG Abd Portable 1V  Result Date: 10/13/2019 CLINICAL DATA:  NG tube advancement, third attempt. EXAM: PORTABLE ABDOMEN - 1 VIEW COMPARISON:  Radiographs earlier this day, CT 10/12/2019 FINDINGS: Tip of the enteric tube below the diaphragm in the region of enteric sutures, side-port is at the gastroesophageal junction. Enteric  sutures in the upper abdomen. No bowel dilatation to suggest obstruction. IMPRESSION: Tip of the enteric tube below the diaphragm, abutting enteric sutures, likely at the gastrojejunostomy junction given history of gastric bypass. Side port is in the region of the gastroesophageal junction. Electronically Signed   By: Keith Rake M.D.   On: 10/13/2019 19:05   DG Abd Portable 1V  Result Date: 10/13/2019 CLINICAL DATA:  NG tube advancement. EXAM: PORTABLE ABDOMEN - 1 VIEW COMPARISON:  October 13, 2019 FINDINGS: A nasogastric tube is seen with its distal tip noted just beyond the gastroesophageal junction. The bowel gas pattern is normal. No radio-opaque calculi or other significant radiographic abnormality are seen. Radiopaque surgical clips are seen overlying the right upper quadrant with additional surgical sutures and surgical clips seen along the lateral aspect of the left upper quadrant. IMPRESSION: Nasogastric tube positioning, as described above. Further advancement of the NG tube by approximately 6 cm is recommended to decrease the risk of aspiration. Electronically Signed   By: Virgina Norfolk M.D.   On: 10/13/2019 16:51   DG Swallowing Func-Speech Pathology  Result Date: 10/13/2019 Objective Swallowing Evaluation: Type of Study: MBS-Modified Barium Swallow Study  Patient Details Name: Barbara Flowers MRN: [id number] Date of Birth: May 27, 1951 Today's Date: 10/13/2019 Time: SLP Start  Time (ACUTE ONLY): 1145 -SLP Stop Time (ACUTE ONLY): 1210 SLP Time Calculation (min) (ACUTE ONLY): 25 min Past Medical History: Past Medical History: Diagnosis Date . Anemia  . Anemia  . Anxiety disorder  . Blood transfusion without reported diagnosis  . Brain cancer [facility])   s/p sterotactic radio surgery . Brain tumor ([place])  . Breast cancer ([place])  . Cancer ([place]) 1990  L breast . H/O bone marrow transplant ([place])  . Hypothyroidism  . IBS (irritable bowel syndrome)  . MDD (major depressive disorder)  . Memory loss  . Obesity  . Orthostatic hypotension  . Seizure ([place])  . Seizures ([place])  . Stroke [facility]) 2005  hemorrhagic . Stroke ([place])  . Subdural hematoma ([place])  . Upper brachial plexus paralysis syndrome  . Urinary incontinence  Past Surgical History: Past Surgical History: Procedure Laterality Date . BREAST LUMPECTOMY Left   post chemotherapy and radiation . CHOLECYSTECTOMY   . CRANIOTOMY FOR HEMISPHERECTOMY TOTAL / PARTIAL Right  . GASTRIC BYPASS  1985 . HAND SURGERY Left 2015 . HAND TENDON SURGERY Left   for brachial plexus injury . LYMPH NODE BIOPSY   HPI: Ms. Amyiah Paulos, 68y/f, presented to ER after 1 week of cough/congestion, poor intake, AMS and weakness. PMH of Breast CA (early 1990's), sundowning/dementia w/c bound x 3 years post CVA, anemia, anxiety disorder, Brain tumor/cancer, hypothyroidism, IBS< Memory loss, hypotension, seizures, upper brachial plexus paralysis syndrome. Prior swallow evaluations 2016 and 2018 both finding normal oropharyngeal swallow function.  Clinical swallow eval on 09/21/2019 done with recommendations for npo x ice chips after oral care.  Pt had NG tube in place at that time.  Xerostomia, poor oral movement of ice chip noted - cough after swallow of ice strong enought to propel secretions into oral cavity to allow suction.  Pt later developed respiratory issues and required intubation (oral intubation) 1/3-.10/07/2019.  Concern for mucus plugging and aspiration noted - most recent  CXR with lower lung improving but LUL airspace dx present.   Per [facility], events occured as follows:  " 12/28 thora centesis, 1/2 thora centesis, 1/3 intubated and transferred to [place] started vanc for MRSA sputum1/11 not weaning well1/13 more awake weaning x 2 hrs->spoke  w/ family still want full support1/14 acceptable rapid shallow. Following commands, extubated.  1/15 looks better ready for floor. PCCM sign off. 1/18 Rapid response overnight, suspected ongoing aspiration, copious thick secretions, poor mentation. PCCM re-engaged , back on vancomycin and Unasyn." NG out 1/19 - swallow eval ordered.  Family desires full code for this pt per notes.  She has been receiving chest PT, WBC elevated to 11.7.  Plan was for pt to received PEG but pt verbalized she did not desire this and MBS ordered to determine if pt could tolerate po.  Subjective: alert, inconsistenly follows commands Assessment / Plan / Recommendation CHL IP CLINICAL IMPRESSIONS 10/13/2019 Clinical Impression Suboptimal view due to pt's kyphosis and side lying position but tested to best ability.   Patient present with moderately severe oropharyngeal dysphagia with sensorimotor deficits. Lingual pumping with premature spillage and impaired bolus cohesion due to pt's weakness/disoordination.  With delays she often reflexively swallows to help clear oral residuals but at times requires cues to swallow.  Pt penetration and aspirated with thin and nectar consistencies due both during swallow *due to decreased laryngeal closure* and after *pyriform sinus residuals spilling into open airway (with further swallow to clear oral residual).  Aspiration at times was silent *trace amount* and other times with reflexive cough *larger amount* mixed with secretions. Reflexive cough did not clear aspirates and was very weak.  Pharyngeal retention noted without pt adequate sensation.  Cued "hock" did not clear pharyngeal retention. Chin tuck attemptd under flouro but pt  unable to perform adequately. Due to pt's level of dysphagia and gross weakness, she will likely aspirate with po provided.  However also suspect she is aspirating secretions based on secretion retention on MBS.  If family, pt and MD desire for pt to consume po with aspiration mitigation, recommend to consider nectar thick liquids (Ensures, Glucerna, etc) - would advise against anything of increased viscocity.  Tsps of water after mouth care advised to enhance oral hygeine and pt comfort.  Being able to optain nutritional support via po alone will be very challenging for this pt due to her level of dysphagia.   SLP Visit Diagnosis Dysphagia, oropharyngeal phase (R13.12) Attention and concentration deficit following -- Frontal lobe and executive function deficit following -- Impact on safety and function Risk for inadequate nutrition/hydration;Severe aspiration risk   CHL IP TREATMENT RECOMMENDATION 10/13/2019 Treatment Recommendations Therapy as outlined in treatment plan below   Prognosis 10/13/2019 Prognosis for Safe Diet Advancement Fair Barriers to Reach Goals Cognitive deficits;Severity of deficits;Time post onset Barriers/Prognosis Comment -- CHL IP DIET RECOMMENDATION 10/13/2019 SLP Diet Recommendations (No Data) Liquid Administration via -- Medication Administration Via alternative means Compensations Slow rate;Small sips/bites;Multiple dry swallows after each bite/sip Postural Changes Seated upright at 90 degrees;Remain semi-upright after after feeds/meals (Comment)   CHL IP OTHER RECOMMENDATIONS 10/13/2019 Recommended Consults -- Oral Care Recommendations Oral care QID Other Recommendations --   CHL IP FOLLOW UP RECOMMENDATIONS 10/13/2019 Follow up Recommendations (No Data)   CHL IP FREQUENCY AND DURATION 10/13/2019 Speech Therapy Frequency (ACUTE ONLY) min 2x/week Treatment Duration 2 weeks      CHL IP ORAL PHASE 10/13/2019 Oral Phase Impaired Oral - Pudding Teaspoon -- Oral - Pudding Cup -- Oral - Honey  Teaspoon Lingual pumping;Reduced posterior propulsion;Lingual/palatal residue;Delayed oral transit;Weak lingual manipulation;Decreased bolus cohesion;Premature spillage Oral - Honey Cup -- Oral - Nectar Teaspoon Lingual pumping;Reduced posterior propulsion;Weak lingual manipulation;Delayed oral transit;Lingual/palatal residue;Decreased bolus cohesion;Premature spillage Oral - Nectar Cup -- Oral - Nectar Straw Lingual  pumping;Lingual/palatal residue;Reduced posterior propulsion;Weak lingual manipulation;Delayed oral transit;Premature spillage;Decreased bolus cohesion Oral - Thin Teaspoon Lingual pumping;Premature spillage;Lingual/palatal residue;Reduced posterior propulsion;Weak lingual manipulation;Delayed oral transit;Decreased bolus cohesion Oral - Thin Cup -- Oral - Thin Straw Lingual pumping;Premature spillage;Lingual/palatal residue;Reduced posterior propulsion;Weak lingual manipulation;Delayed oral transit;Decreased bolus cohesion Oral - Puree Lingual pumping;Premature spillage;Lingual/palatal residue;Reduced posterior propulsion;Weak lingual manipulation;Delayed oral transit;Decreased bolus cohesion Oral - Mech Soft -- Oral - Regular -- Oral - Multi-Consistency -- Oral - Pill -- Oral Phase - Comment --  CHL IP PHARYNGEAL PHASE 10/13/2019 Pharyngeal Phase Impaired Pharyngeal- Pudding Teaspoon -- Pharyngeal -- Pharyngeal- Pudding Cup -- Pharyngeal -- Pharyngeal- Honey Teaspoon Delayed swallow initiation-vallecula;Reduced epiglottic inversion;Penetration/Aspiration during swallow;Pharyngeal residue - pyriform;Pharyngeal residue - valleculae Pharyngeal Material enters airway, remains ABOVE vocal cords and not ejected out Pharyngeal- Honey Cup -- Pharyngeal -- Pharyngeal- Nectar Teaspoon Delayed swallow initiation-pyriform sinuses;Pharyngeal residue - pyriform;Penetration/Aspiration during swallow;Pharyngeal residue - valleculae;Reduced laryngeal elevation;Reduced airway/laryngeal closure Pharyngeal Material enters  airway, passes BELOW cords and not ejected out despite cough attempt by patient;Material enters airway, passes BELOW cords without attempt by patient to eject out (silent aspiration) Pharyngeal- Nectar Cup -- Pharyngeal -- Pharyngeal- Nectar Straw Delayed swallow initiation-pyriform sinuses;Delayed swallow initiation-vallecula;Pharyngeal residue - valleculae;Pharyngeal residue - pyriform;Reduced laryngeal elevation;Reduced airway/laryngeal closure Pharyngeal Material enters airway, passes BELOW cords without attempt by patient to eject out (silent aspiration);Material enters airway, passes BELOW cords and not ejected out despite cough attempt by patient Pharyngeal- Thin Teaspoon Penetration/Aspiration during swallow;Reduced airway/laryngeal closure;Reduced laryngeal elevation;Delayed swallow initiation-pyriform sinuses;Pharyngeal residue - valleculae;Pharyngeal residue - pyriform Pharyngeal Material enters airway, passes BELOW cords and not ejected out despite cough attempt by patient Pharyngeal- Thin Cup -- Pharyngeal -- Pharyngeal- Thin Straw Penetration/Aspiration during swallow;Reduced laryngeal elevation;Delayed swallow initiation-pyriform sinuses;Pharyngeal residue - pyriform Pharyngeal Material enters airway, passes BELOW cords and not ejected out despite cough attempt by patient Pharyngeal- Puree Delayed swallow initiation-vallecula;Reduced laryngeal elevation;Reduced tongue base retraction;Pharyngeal residue - valleculae;Pharyngeal residue - pyriform Pharyngeal Material does not enter airway Pharyngeal- Mechanical Soft -- Pharyngeal -- Pharyngeal- Regular -- Pharyngeal -- Pharyngeal- Multi-consistency -- Pharyngeal -- Pharyngeal- Pill -- Pharyngeal -- Pharyngeal Comment --  CHL IP CERVICAL ESOPHAGEAL PHASE 10/13/2019 Cervical Esophageal Phase (No Data) Pudding Teaspoon -- Pudding Cup -- Honey Teaspoon -- Honey Cup -- Nectar Teaspoon -- Nectar Cup -- Nectar Straw -- Thin Teaspoon -- Thin Cup -- Thin Straw --  Puree -- Mechanical Soft -- Regular -- Multi-consistency -- Pill -- Cervical Esophageal Comment -- [name] 10/13/2019, 1:06 PM    Kathleen Lime, MS [facility] SLP Acute Rehab Services Office [phone]                Scheduled Meds: . chlorhexidine  15 mL Mouth Rinse BID  . Chlorhexidine Gluconate Cloth  6 each Topical Daily  . collagenase   Topical Daily  . donepezil  10 mg Per Tube QHS  . feeding supplement (PRO-STAT SUGAR FREE 64)  30 mL Per Tube Daily  . furosemide  40 mg Intravenous Daily  . Gerhardt's butt cream   Topical BID  . heparin  5,000 Units Subcutaneous Q8H  . mouth rinse  15 mL Mouth Rinse q12n4p  . memantine  10 mg Per Tube BID  . potassium chloride  20 mEq Oral Daily  . sennosides  5 mL Per Tube BID   Continuous Infusions: . sodium chloride Stopped (10/10/19 0257)  . ampicillin-sulbactam (UNASYN) IV 3 g (10/15/19 0929)  . feeding supplement (OSMOLITE 1.2 CAL) 1,000 mL (10/15/19 0506)  . vancomycin Stopped (  10/14/19 1217)     LOS: 29 days    Time spent: 30 minutes     [name], MD [facility] Pager [phone]

## 2019-10-15 NOTE — Progress Notes (Signed)
KUB shows tube with good position, confirm with NP Bodenheimer.  Will restart tf.

## 2019-10-16 LAB — CBC WITH DIFFERENTIAL/PLATELET
Abs Immature Granulocytes: 0.17 10*3/uL — ABNORMAL HIGH (ref 0.00–0.07)
Basophils Absolute: 0.1 10*3/uL (ref 0.0–0.1)
Basophils Relative: 1 %
Eosinophils Absolute: 0.2 10*3/uL (ref 0.0–0.5)
Eosinophils Relative: 1 %
HCT: 29.4 % — ABNORMAL LOW (ref 36.0–46.0)
Hemoglobin: 8.7 g/dL — ABNORMAL LOW (ref 12.0–15.0)
Immature Granulocytes: 1 %
Lymphocytes Relative: 17 %
Lymphs Abs: 2.4 10*3/uL (ref 0.7–4.0)
MCH: 28.7 pg (ref 26.0–34.0)
MCHC: 29.6 g/dL — ABNORMAL LOW (ref 30.0–36.0)
MCV: 97 fL (ref 80.0–100.0)
Monocytes Absolute: 1 10*3/uL (ref 0.1–1.0)
Monocytes Relative: 7 %
Neutro Abs: 10.3 10*3/uL — ABNORMAL HIGH (ref 1.7–7.7)
Neutrophils Relative %: 73 %
Platelets: 269 10*3/uL (ref 150–400)
RBC: 3.03 MIL/uL — ABNORMAL LOW (ref 3.87–5.11)
RDW: 14.8 % (ref 11.5–15.5)
WBC: 14.2 10*3/uL — ABNORMAL HIGH (ref 4.0–10.5)
nRBC: 0 % (ref 0.0–0.2)

## 2019-10-16 LAB — GLUCOSE, CAPILLARY
Glucose-Capillary: 143 mg/dL — ABNORMAL HIGH (ref 70–99)
Glucose-Capillary: 118 mg/dL — ABNORMAL HIGH (ref 70–99)
Glucose-Capillary: 113 mg/dL — ABNORMAL HIGH (ref 70–99)
Glucose-Capillary: 121 mg/dL — ABNORMAL HIGH (ref 70–99)

## 2019-10-16 LAB — COMPREHENSIVE METABOLIC PANEL
ALT: 21 U/L (ref 0–44)
AST: 24 U/L (ref 15–41)
Albumin: 2.3 g/dL — ABNORMAL LOW (ref 3.5–5.0)
Alkaline Phosphatase: 72 U/L (ref 38–126)
Anion gap: 10 (ref 5–15)
BUN: 34 mg/dL — ABNORMAL HIGH (ref 8–23)
CO2: 28 mmol/L (ref 22–32)
Calcium: 8.3 mg/dL — ABNORMAL LOW (ref 8.9–10.3)
Chloride: 100 mmol/L (ref 98–111)
Creatinine, Ser: 0.62 mg/dL (ref 0.44–1.00)
GFR calc Af Amer: >60 mL/min (ref >60)
GFR calc non Af Amer: >60 mL/min (ref >60)
Glucose, Bld: 146 mg/dL — ABNORMAL HIGH (ref 70–99)
Potassium: 4.7 mmol/L (ref 3.5–5.1)
Sodium: 138 mmol/L (ref 135–145)
Total Bilirubin: 0.6 mg/dL (ref 0.3–1.2)
Total Protein: 5.9 g/dL — ABNORMAL LOW (ref 6.5–8.1)

## 2019-10-16 MED ORDER — OSMOLITE 1.5 CAL PO LIQD
1000.0000 mL | ORAL | Status: DC
  Administered 2019-10-16: 1000 mL
  Filled 2019-10-16 (×3): qty 1000

## 2019-10-16 MED ORDER — PRO-STAT SUGAR FREE PO LIQD
30.0000 mL | Freq: Two times a day (BID) | ORAL | Status: DC
  Administered 2019-10-18 – 2019-10-19 (×2): 30 mL
  Filled 2019-10-16: qty 30

## 2019-10-16 NOTE — Progress Notes (Signed)
I went back up to see the patient.  She once again is oriented to her self, place, and somewhat to her situation.  She has answered that Mr. Milinda Pointer was still president earlier today which would be appropriate given she has been here since December.  She is very tired today and does not talk as much as she did last week, but still once again tells me that she DOES NOT want a g-tube placed.  I have called the husband who also called the son and he was on the phone as well.  I explained once again that the patient is able to answer certain questions that appear to show lucidity in making her decisions.  We discussed again that we are not comfortable or feel that we are being the patient's advocate to go against her wishes.  The family feels as though she doesn't have capacity.  I have informed them that at this point, the only way we would proceed is if she was deemed to not have capacity at which point decision making would fall to the POA.  I have relayed this conversation to Dr. Sloan Leiter.  We will follow at a distance for now.  Henreitta Cea 4:32 PM 10/16/2019

## 2019-10-16 NOTE — Progress Notes (Signed)
SLP Cancellation Note  Patient Details Name: Laurenashley Nakagawa MRN: [id number] DOB: 03-Jun-1951   Cancelled treatment:       Reason Eval/Treat Not Completed: Other (comment)(pt has small bore feeding tube with possible longer term tube indicated, will continue efforts for family/pt education as all parties do not appear to agree with care plan.)  Kathleen Lime, MS [place] Office [phone]  [name] 10/16/2019, 10:53 AM

## 2019-10-16 NOTE — Progress Notes (Signed)
PHYSICAL THERAPY HYDROTHERAPY TREATMENT NOTE      10/16/19 1200  Subjective Assessment  Subjective pt did not speak today  Patient and Family Stated Goals none stated  Date of Onset  (present on admission)  Evaluation and Treatment  Evaluation and Treatment Procedures Explained to Patient/Family Yes  Evaluation and Treatment Procedures Patient unable to consent due to mental status  Pressure Injury 09/17/19 Sacrum Medial;Left Unstageable - Full thickness tissue loss in which the base of the injury is covered by slough (yellow, tan, gray, green or brown) and/or eschar (tan, brown or black) in the wound bed. wound w eschar  Date First Assessed/Time First Assessed: 09/17/19 0245   Location: Sacrum  Location Orientation: Medial;Left  Staging: Unstageable - Full thickness tissue loss in which the base of the injury is covered by slough (yellow, tan, gray, green or brown) an...  Dressing Type Foam - Lift dressing to assess site every shift;Gauze (Comment) (Santyl; barrier cream)  Dressing Changed  Dressing Change Frequency Twice a day  State of Healing Non-healing  Site / Wound Assessment Pink;Bleeding;Yellow;Brown;Black  % Wound base Red or Granulating 35%  % Wound base Yellow/Fibrinous Exudate 55%  % Wound base Black/Eschar 10%  Peri-wound Assessment Maceration;Erythema (non-blanchable) (tissue 12-3:00 appears necrotic)  Wound Length (cm) 14 cm  Wound Width (cm) 6 cm  Wound Depth (cm) 2.5 cm  Wound Surface Area (cm^2) 84 cm^2  Wound Volume (cm^3) 210 cm^3  Margins Unattached edges (unapproximated)  Drainage Amount Minimal  Drainage Description Serosanguineous;Odor  Treatment Debridement (Selective);Hydrotherapy (Pulse lavage);Packing (Saline gauze) (Enzymatic debridement)  Hydrotherapy  Pulsed lavage therapy - wound location sacrum  Pulsed Lavage with Suction (psi) 8 psi  Pulsed Lavage with Suction - Normal Saline Used 1000 mL  Pulsed Lavage Tip Tip with splash shield  Selective  Debridement  Selective Debridement - Location sacrum  Selective Debridement - Tools Used Forceps;Scissors  Selective Debridement - Tissue Removed yellow slough, grey eschar  Wound Therapy - Assess/Plan/Recommendations  Wound Therapy - Clinical Statement 69 yo female admitted to hospital with weakness. Hx of CVA with L residual weakness, dementia, Parkinsons, sacral wounds, contractures. WC bound PTA.   Wound Therapy - Functional Problem List CVA with L residual weakness, contractures, Parkinson's  Factors Delaying/Impairing Wound Healing Incontinence;Immobility  Hydrotherapy Plan Debridement;Dressing change;Patient/family education;Pulsatile lavage with suction  Wound Therapy - Frequency 6X / week  Wound Therapy - Current Recommendations WOC nurse;Case manager/social work  Wound Therapy - Follow Up Recommendations [facility];Skilled nursing facility  Wound Plan Palliative following. Questionable Prognosis. Will plan to continue hydro, dressing changes. Will assess and update recommendations as needed.   Wound Therapy Goals - Improve the function of patient's integumentary system by progressing the wound(s) through the phases of wound healing by:  Decrease Necrotic Tissue to 30  Decrease Necrotic Tissue - Progress Progressing toward goal  Increase Granulation Tissue to 70  Increase Granulation Tissue - Progress Progressing toward goal  Goals/treatment plan/discharge plan were made with and agreed upon by patient/family No, Patient unable to participate in goals/treatment/discharge plan and family unavailable  Time For Goal Achievement 2 weeks  Wound Therapy - Potential for Goals Poor     Doreatha Massed, PT Acute Rehabilitation

## 2019-10-16 NOTE — Progress Notes (Signed)
Nutrition Follow-up  DOCUMENTATION CODES:   Non-severe (moderate) malnutrition in context of chronic illness  INTERVENTION:  - will adjust TF regimen: Osmolite 1.5 @ 50 ml/hr with 30 ml prostat BID. - this regimen will provide 2000 kcal, 105 grams protein, and 914 ml free water. - free water flush, if desired, to be per MD/NP.    NUTRITION DIAGNOSIS:   Moderate Malnutrition related to chronic illness(wheelchair bound x3 years following CVA) as evidenced by mild fat depletion, mild muscle depletion. 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 She presented to the ED due to 1 week of worsening cough and congestion. She was also experiencing poor oral intake, AMS, and increased weakness.  Significant Events: 12/26- admission 12/29- NGT placement; TF initiation 1/3- intubation 1/14- extubation 1/15- transferred from 2 [place] to [place] and then to [place] 1/18- transferred back to 2 [place] after Rapid Response over night 1/22- NGT placed in R nare and TF initiated    Patient is sleeping at this time with no family or visitors present. Patient has R nare NGT in place and is receiving Osmolite 1.2 @ 60 ml/hr with 30 ml prostat once/day. This regimen is providing 1828 kcal, 95 grams protein, and 1181 ml free water. Will adjust TF regimen as outlined above to better meet needs. Weight has been fairly stable over the past 1 week.   Per notes: - Surgery has been consulted for G-tube placement d/t husband and children's desire for this - very poor functional status/severe debility with persistent dysphagia aspiration - acute metabolic encephalopathy with hx of underlying dementia -  hypokalemia--resolved s/p repletion order - stage 4 sacral pressure injury   Labs reviewed; CBGs: 143 and 118 mg/dl, BUN: 34 mmol/l, Ca: 8.3 mg/dl. Medications reviewed; 40 mg IV lasix/day, 20 mEq Klor-Con/day, 5 ml senokot per tube BID.      Diet Order:   Diet Order            Diet NPO time specified  Diet effective now              EDUCATION NEEDS:   Not appropriate for education at this time  Skin:  Skin Assessment: Skin Integrity Issues: Skin Integrity Issues:: Stage I, Stage II, Unstageable Stage I: upper L back Stage II: L thigh, R ear (newly documented 1/14) Stage III: sacrum Unstageable: full thickness to sacrum Other: venous stasis ulcer to L thigh  Last BM:  1/23  Height:   Ht Readings from Last 1 Encounters:  09/25/19 5' 8"  (1.727 m)    Weight:   Wt Readings from Last 1 Encounters:  10/16/19 56 kg    Ideal Body Weight:  63.6 kg  BMI:  Body mass index is 18.77 kg/m.  Estimated Nutritional Needs:   Kcal:  2000-2200 kcal  Protein:  100-115 grams  Fluid:  >/= 2 L/day     Jarome Matin, MS, RD, LDN, [facility] Inpatient Clinical Dietitian Pager # [phone] After hours/weekend pager # [phone]

## 2019-10-16 NOTE — Progress Notes (Signed)
PROGRESS NOTE    Barbara Flowers  [id number] DOB: [birth year] DOA: 09/16/2019 PCP: [name], NP    Brief Narrative:  Patient is here for prolonged period. Previous events and treatments events are based upon previous physicians notes and records as below. 69 year old female with history of breast cancer, dementia, history of CVA with left-sided hemiplegia, from home, wheelchair-bound for last 3 years presented to the emergency room on 09/16/2019 with 1 week of cough, congestion, worsening shortness of breath.  Initially patient was found with extensive left-sided infiltrate and left-sided effusion with hypoxemia corrected with nonrebreather and admitted to ICU.  Events as below: 12/28 thoracentesis 1/2 thoracentesis 1/3 intubated and transferred to ICU 1/8 started vanc for MRSA sputum 1/11 not weaning well 1/13 more awake weaning x 2 hrs->spoke w/ family still want full support 1/14 acceptable rapid shallow. Following commands, extubated.   1/15 looks better ready for floor. PCCM sign off.  1/18 Rapid response overnight, suspected ongoing aspiration, copious thick secretions, poor mentation. PCCM re-engaged , back on vancomycin and Unasyn and treated for 7 days with vanco and unasyn.  1/22 patient is clinically improving, remains with aspiration risk. 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 -Surgery consulted for surgical G-tube placement, patient declined so procedure on hold.  - NG tube feeding started. - patient's family state that she does not understand all consequences of not having the procedure, so husband and two children wanting patient to have PEG tube placement and SNF placement.   Long-term feeding issues/ethics: Patient has very poor functional status and now with persistent dysphagia aspiration.  Maintained on NG tube feeding.  Patient's husband, 2 children all recommending for artificial  feeding including PEG tube. I do not anticipate that patient will be eating in the near future given the extent of debility. At the bedside, patient had desired not to have any surgery with the surgical team, she occasionally tells nursing staff that she is tired with all this and does not want to go through it . however since patient's family, spouse who is healthcare power of attorney has expressed that she is cognitively impaired and she does not understand all benefits and side effects, long-term effects of it so husband should be able to make decisions. I have relayed this to surgical team, they will evaluate patient today.    Antibiotics: Rocephin 12/26 >> (14 day course) 1/8 Azithromycin 12/27 > 28 Vancomycin 12/26, 1/7-1/12, 1/18 > 1/24 Unasyn 1/18 > 1/24  Diagnostic data: Covid 19 PCR 12/26 >> neg RVP 12/26 >>neg  Procalcitonin 12/26 >> 2.01, down trended to 0.4 with abx BCx2 12/26 >>Pan sensitive Proteus Mirabilis UC 12/26 >> negative Urine Pneum Ag 12/26 >> positive Respiratory culture 1/6>> MRSA  Assessment & Plan:   Active Problems:   Dementia ([place])   CAP (community acquired pneumonia)   Pleural effusion on left   Acute prerenal azotemia   Hypernatremia   Pressure injury of skin   Malnutrition of moderate degree   Acute hypoxemic respiratory failure (HCC)   Pleural effusion   Acute systolic heart failure (HCC)   Failure to thrive (0-17)   Palliative care by specialist   Goals of care, counseling/discussion   Generalized weakness  Acute hypoxemic and hypercarbic respiratory failure in the setting of MRSA pneumonia, poor cough clearance and extreme debility: Thoracentesis x2, status post intubation and extubated on 1/14. Now on room air. Continue chest physiotherapy and deep breathing exercises if she can  do it. Patient finished second round of antibiotics with vancomycin and Unasyn 1/18-1/24.  Acute metabolic encephalopathy with history of underlying  dementia: CT head negative.  Slightly more responsive and awake now.  With history of previous strokes, contractures and debility.  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 fib with RVR: EF 25 to 30%.  Intermittent A. fib.  Currently in sinus rhythm.  Hypokalemia: Replaced.  On a schedule replacement with Lasix. Continue close monitoring.  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  Seen by surgery.  Bedside debridement.  PT doing hydrotherapy.  Nutrition: Patient has been n.p.o.  Multiple attempts of NG tube placement failed 1/19. Evaluated by speech therapy, unable to safely swallow. IR unable to do PEG tube due to Roux-en-Y. Patient declined to have procedure with surgery. Now on NG tube feeding.  Tolerating well. Fluid: Intermittent dextrose.  Now tolerating tube feeding. Goal of care: Family desired full code including tracheostomy if needed, PEG tube placement.    DVT prophylaxis: Heparin subcu Code Status: Full code Family Communication: Meeting with patient's husband, 1/24, patient's son and daughter on the phone.  Will discuss with him today. Disposition Plan: from home . No feeding means now. Probably placement to skilled nursing facility after establishing artificial feeding.   Consultants:   PCCM,  Surgery,  Palliative medicine  Procedures:   Bedside wound debridement, 1/19  Antimicrobials:   Currently on Unasyn and vancomycin.  Finished treatment.   Subjective:  Seen and examined.  Remains the same.  No overnight events.  Telemetry with sinus rhythm.  Objective: Vitals:   10/16/19 0400 10/16/19 0500 10/16/19 0600 10/16/19 0800  BP: (!) 112/47 (!) 121/49 (!) 124/51 (!) 119/18  Pulse: 61 77 74 77  Resp: 19 17 (!) 21 20  Temp:    98.6 F (37 C)  TempSrc:    Axillary  SpO2: 99% 99% 100% 99%  Weight:  56 kg    Height:        Intake/Output Summary (Last 24 hours) at 10/16/2019 0836  Last data filed at 10/16/2019 0800 Gross per 24 hour  Intake 2029.63 ml  Output 2875 ml  Net -845.37 ml   Filed Weights   10/14/19 0420 10/15/19 0500 10/16/19 0500  Weight: 59.5 kg 59.3 kg 56 kg    Examination:  Physical Exam  Constitutional:  Chronically sick looking, debilitated, lethargic.  HENT:  Head: Normocephalic.  Eyes: Pupils are equal, round, and reactive to light.  Cardiovascular: Regular rhythm.  Respiratory: Breath sounds normal.  On room air.  GI: Bowel sounds are normal.  NG tube infusing.  Musculoskeletal:     Cervical back: Neck supple.     Comments: Bilateral lower leg contractures, not moving. Right upper extremity normal power. Left upper extremity contracture with minimal movement.  Neurological: She is alert.  Oriented x1-2.  Flat affect.  Occasionally anxious.  Skin:  Stage IV decubital ulcer left sacrum with dressing.      Data Reviewed: I have personally reviewed following labs and imaging studies  CBC: Recent Labs  Lab 10/12/19 0430 10/13/19 0149 10/14/19 0706 10/15/19 0220 10/16/19 0228  WBC 9.9 11.0* 10.7* 14.4* 14.2*  NEUTROABS 6.6 7.1 6.8 10.3* 10.3*  HGB 8.2* 8.6* 9.1* 8.8* 8.7*  HCT 27.5* 28.9* 30.4* 29.5* 29.4*  MCV 97.9 97.6 96.8 96.1 97.0  PLT 314 295 278 311 [id number]   Basic Metabolic Panel: Recent Labs  Lab 10/09/19 1900 10/10/19 0500 10/12/19 0430  10/13/19 0149 10/14/19 0706 10/15/19 0220 10/16/19 0228  NA  --    < > 144 142 140 139 138  K  --    < > 4.3 4.1 4.2 4.7 4.7  CL  --    < > 107 105 101 102 100  CO2  --    < > 27 28 31 28 28   GLUCOSE  --    < > 101* 157* 127* 130* 146*  BUN  --    < > 30* 22 22 29* 34*  CREATININE  --    < > 0.51 0.49 0.50 0.54 0.62  CALCIUM  --    < > 8.2* 8.0* 8.1* 8.2* 8.3*  MG 2.3  --  1.9 2.3 2.0 2.1  --   PHOS  --   --  2.3* 4.1 3.0 3.5  --    < > = values in this interval not displayed.   GFR: Estimated Creatinine Clearance: 58.7 mL/min (by C-G formula based on SCr of 0.62  mg/dL). Liver Function Tests: Recent Labs  Lab 10/12/19 0430 10/13/19 0149 10/14/19 0706 10/15/19 0220 10/16/19 0228  AST 27 24 16 18 24   ALT 31 28 22 21 21   ALKPHOS 75 76 82 77 72  BILITOT 1.0 0.6 0.5 0.6 0.6  PROT 5.8* 5.8* 5.9* 5.9* 5.9*  ALBUMIN 2.3* 2.2* 2.4* 2.4* 2.3*   No results for input(s): LIPASE, AMYLASE in the last 168 hours. Recent Labs  Lab 10/09/19 1013  AMMONIA 16   Coagulation Profile: No results for input(s): INR, PROTIME in the last 168 hours. Cardiac Enzymes: No results for input(s): CKTOTAL, CKMB, CKMBINDEX, TROPONINI in the last 168 hours. BNP (last 3 results) No results for input(s): PROBNP in the last 8760 hours. HbA1C: No results for input(s): HGBA1C in the last 72 hours. CBG: Recent Labs  Lab 10/15/19 1550 10/15/19 1934 10/15/19 2327 10/16/19 0323 10/16/19 0752  GLUCAP 112* 117* 117* 143* 118*   Lipid Profile: No results for input(s): CHOL, HDL, LDLCALC, TRIG, CHOLHDL, LDLDIRECT in the last 72 hours. Thyroid Function Tests: No results for input(s): TSH, T4TOTAL, FREET4, T3FREE, THYROIDAB in the last 72 hours. Anemia Panel: No results for input(s): VITAMINB12, FOLATE, FERRITIN, TIBC, IRON, RETICCTPCT in the last 72 hours. Sepsis Labs: No results for input(s): PROCALCITON, LATICACIDVEN in the last 168 hours.  Recent Results (from the past 240 hour(s))  MRSA PCR Screening     Status: Abnormal   Collection Time: 10/09/19  9:21 AM   Specimen: Nasal Mucosa; Nasopharyngeal  Result Value Ref Range Status   MRSA by PCR POSITIVE (A) NEGATIVE Final    Comment:        The GeneXpert MRSA Assay (FDA approved for NASAL specimens only), is one component of a comprehensive MRSA colonization surveillance program. It is not intended to diagnose MRSA infection nor to guide or monitor treatment for MRSA infections. RESULT CALLED TO, READ BACK BY AND VERIFIED WITH: ROMINES,H. RN @1531  10/09/19 BILLINGSLEY,L Performed at [facility], [place] [address], [place], [place] [address]          Radiology Studies: DG Abd 1 View  Result Date: 10/15/2019 CLINICAL DATA:  NG tube placement EXAM: ABDOMEN - 1 VIEW COMPARISON:  10/13/2019 FINDINGS: Enteric tube terminates in the mid gastric body. Surgical sutures in the left upper/mid abdomen. Cholecystectomy clips. Nonobstructive bowel gas pattern. IMPRESSION: Enteric tube terminates in the mid gastric body. Electronically Signed   By: Julian Hy M.D.   On:  10/15/2019 06:04        Scheduled Meds: . chlorhexidine  15 mL Mouth Rinse BID  . Chlorhexidine Gluconate Cloth  6 each Topical Daily  . collagenase   Topical Daily  . donepezil  10 mg Per Tube QHS  . feeding supplement (PRO-STAT SUGAR FREE 64)  30 mL Per Tube Daily  . furosemide  40 mg Intravenous Daily  . Gerhardt's butt cream   Topical BID  . heparin  5,000 Units Subcutaneous Q8H  . mouth rinse  15 mL Mouth Rinse q12n4p  . memantine  10 mg Per Tube BID  . potassium chloride  20 mEq Oral Daily  . sennosides  5 mL Per Tube BID   Continuous Infusions: . sodium chloride 250 mL (10/16/19 0614)  . feeding supplement (OSMOLITE 1.2 CAL) 60 mL/hr at 10/16/19 0614     LOS: 30 days    Time spent: 30 minutes     [name], MD [facility] Pager [phone]

## 2019-10-16 NOTE — Progress Notes (Signed)
I saw patient this morning after another request for a gtube from the family.  The patient is very sleepy this morning and difficult to arouse for conversation.  At this point, our recommendations remain the same.  If the patient still tells [place] that she does not want a g-tube, despite what the family requests we will NOT place a g-tube, unless the patient is deemed not competent.  If she is competent, which she was in my opinion and Dr. Ron Parker opinion last Thursday and Friday, then ethics will need to get involved as we are being the patient's advocate in what she would like for herself.  I will try to see her later when she is more awake to reassess.    Henreitta Cea 9:50 AM 10/16/2019

## 2019-10-16 NOTE — Progress Notes (Signed)
Received report and assessed patient at 19:30.  At that time the patient's NG was in place with external length measured at 60cm.  At 20:00 Nurse Delaine Lame found patient with NG pulled completely out lying on the bed.  Tubefeeding was paused.  The patient reports that she pulled out the tube on purpose because she does not want it in her nose.  Alerted Mid level Baltazar Najjar to the events, requests that if the patient is compliant to replace tube but if she declines then do not replace tube.  This nurse then asked the patient several times if the NG tube could be re inserted.  Explained in detail that the patient could not get her medications or nutrition without the tube.  Patient repeated three times that she did not want the NG tube re inserted, she wanted it left out and she understood she would not be getting any nutrition without the tube.  The patient is currently alert to herself, location and situation.

## 2019-10-17 ENCOUNTER — Inpatient Hospital Stay (HOSPITAL_COMMUNITY): Payer: Medicare Other

## 2019-10-17 ENCOUNTER — Encounter (HOSPITAL_COMMUNITY): Payer: Self-pay | Admitting: Internal Medicine

## 2019-10-17 DIAGNOSIS — Z9481 Bone marrow transplant status: Secondary | ICD-10-CM

## 2019-10-17 DIAGNOSIS — R1310 Dysphagia, unspecified: Secondary | ICD-10-CM

## 2019-10-17 DIAGNOSIS — F0150 Vascular dementia without behavioral disturbance: Secondary | ICD-10-CM

## 2019-10-17 DIAGNOSIS — Z9884 Bariatric surgery status: Secondary | ICD-10-CM

## 2019-10-17 DIAGNOSIS — Z515 Encounter for palliative care: Secondary | ICD-10-CM

## 2019-10-17 DIAGNOSIS — Z7189 Other specified counseling: Secondary | ICD-10-CM

## 2019-10-17 DIAGNOSIS — M419 Scoliosis, unspecified: Secondary | ICD-10-CM

## 2019-10-17 DIAGNOSIS — R627 Adult failure to thrive: Secondary | ICD-10-CM

## 2019-10-17 DIAGNOSIS — L89154 Pressure ulcer of sacral region, stage 4: Secondary | ICD-10-CM

## 2019-10-17 DIAGNOSIS — R2689 Other abnormalities of gait and mobility: Secondary | ICD-10-CM | POA: Diagnosis present

## 2019-10-17 LAB — COMPREHENSIVE METABOLIC PANEL
ALT: 27 U/L (ref 0–44)
AST: 32 U/L (ref 15–41)
Albumin: 2.7 g/dL — ABNORMAL LOW (ref 3.5–5.0)
Alkaline Phosphatase: 80 U/L (ref 38–126)
Anion gap: 10 (ref 5–15)
BUN: 37 mg/dL — ABNORMAL HIGH (ref 8–23)
CO2: 32 mmol/L (ref 22–32)
Calcium: 8.8 mg/dL — ABNORMAL LOW (ref 8.9–10.3)
Chloride: 97 mmol/L — ABNORMAL LOW (ref 98–111)
Creatinine, Ser: 0.59 mg/dL (ref 0.44–1.00)
GFR calc Af Amer: >60 mL/min (ref >60)
GFR calc non Af Amer: >60 mL/min (ref >60)
Glucose, Bld: 101 mg/dL — ABNORMAL HIGH (ref 70–99)
Potassium: 4.9 mmol/L (ref 3.5–5.1)
Sodium: 139 mmol/L (ref 135–145)
Total Bilirubin: 0.8 mg/dL (ref 0.3–1.2)
Total Protein: 6.8 g/dL (ref 6.5–8.1)

## 2019-10-17 LAB — CBC WITH DIFFERENTIAL/PLATELET
Abs Immature Granulocytes: 0.13 10*3/uL — ABNORMAL HIGH (ref 0.00–0.07)
Basophils Absolute: 0.1 10*3/uL (ref 0.0–0.1)
Basophils Relative: 1 %
Eosinophils Absolute: 0.2 10*3/uL (ref 0.0–0.5)
Eosinophils Relative: 1 %
HCT: 32.3 % — ABNORMAL LOW (ref 36.0–46.0)
Hemoglobin: 9.8 g/dL — ABNORMAL LOW (ref 12.0–15.0)
Immature Granulocytes: 1 %
Lymphocytes Relative: 17 %
Lymphs Abs: 2.4 10*3/uL (ref 0.7–4.0)
MCH: 29.5 pg (ref 26.0–34.0)
MCHC: 30.3 g/dL (ref 30.0–36.0)
MCV: 97.3 fL (ref 80.0–100.0)
Monocytes Absolute: 1 10*3/uL (ref 0.1–1.0)
Monocytes Relative: 7 %
Neutro Abs: 10.9 10*3/uL — ABNORMAL HIGH (ref 1.7–7.7)
Neutrophils Relative %: 73 %
Platelets: 282 10*3/uL (ref 150–400)
RBC: 3.32 MIL/uL — ABNORMAL LOW (ref 3.87–5.11)
RDW: 15.2 % (ref 11.5–15.5)
WBC: 14.8 10*3/uL — ABNORMAL HIGH (ref 4.0–10.5)
nRBC: 0 % (ref 0.0–0.2)

## 2019-10-17 LAB — GLUCOSE, CAPILLARY
Glucose-Capillary: 102 mg/dL — ABNORMAL HIGH (ref 70–99)
Glucose-Capillary: 122 mg/dL — ABNORMAL HIGH (ref 70–99)
Glucose-Capillary: 81 mg/dL (ref 70–99)
Glucose-Capillary: 84 mg/dL (ref 70–99)
Glucose-Capillary: 90 mg/dL (ref 70–99)
Glucose-Capillary: 96 mg/dL (ref 70–99)
Glucose-Capillary: 98 mg/dL (ref 70–99)

## 2019-10-17 MED ORDER — DEXTROSE IN LACTATED RINGERS 5 % IV SOLN: INTRAVENOUS | Status: DC

## 2019-10-17 MED ORDER — SODIUM CHLORIDE 0.9 % IV SOLN
2.0000 g | INTRAVENOUS | Status: AC
  Administered 2019-10-18: 2 g via INTRAVENOUS
  Filled 2019-10-17 (×2): qty 2

## 2019-10-17 NOTE — Progress Notes (Signed)
Palliative care progress note  Reason for visit: Goals of care regarding potential placement of PEG tube  Received call and discussed case with Dr. Johney Maine from general surgery.  Barbara Flowers was evaluated by psychiatry today and deemed not to have capacity at time of psychiatry encounter.  Discussed with Dr. Johney Maine that capacity is something that can change in the same way that mental status can wax and wane, but she was currently unable to make her own decision and family has been consistent in desire to pursue PEG tube placement for Barbara Flowers.  I called again and was able to reach patient's husband, Barbara Flowers.  We discussed her clinical course over the past month and his hope for her care moving forward.  He expressed understanding concern that PEG tube placement carries risk of complication but he feels that this is the best pathway forward to work to get her back to their home.  He states that his hope with PEG tube placement is to allow him to take her to home and see how she progresses over the next couple of weeks.  He states he understands that she is either going to get better or worse but feels that in either case home is where she would want to be.  He told me that that he feels the best case scenario would be if she can get closer to her baseline 30 days ago prior to this admission.  He is open to conversation regarding potential burdens of PEG tube placement as he wants to make an informed decision moving forward.  I discussed with him concern that placement of PEG tube would not decrease her risk of aspiration pneumonia while it may increase risk of bedsores worsening as well as her requiring some sort of restraint in the future if she pulls at PEG tube.  I discussed this was a concern that I had based on the fact that she recently pulled NG tube.  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  Discussed that my understanding is that PEG tube placement would be attempted one time and it would not be replaced if she pulls it out.  We also discussed the prior difficulty she has had with weaning from ventilator in the past and concern that this may also be the case when she is reintubated for surgical procedure.  Her husband reports that this would have to be something they would discuss further based upon the actual situation in order to decide best plan moving forward in this scenario.  -Overall, family remains invested in plan for continued aggressive interventions including placement of PEG tube.  I had a thorough discussion with her husband today regarding potential complications in order to ensure that he had full understanding of likely pathways moving forward.  He reports being appreciative of information provided.  His daughter is an NP and his son is her neurosurgery resident and he has been relying on them to help him make decisions regarding care plan moving forward.  He reports that he will continue to this moving forward, but they have talked about many of the scenarios we discussed today and are confident in plan to pursue PEG tube placement. -My recommendation if plan is for PEG tube placement moving forward was to consider this is a time-limited trial to see if  she continues to benefit in improving her cognition, nutrition, and functional status over a few weeks following PEG tube placement.  If she continues to improve, I recommend they continue with this course.  If it does not, however, result in her continuing to have improvement in her quality of life, I recommended that family rediscuss long-term goals with consideration that it would be reasonable in any point in time to consider forgoing continuation of artificial nutrition and transition to hospice support if her main goal is to be at home and she is not  showing clinical improvement from trial of feeding via PEG tube.  I also recommended consideration for hospice in the event that she has one of the complications we discussed today (such as pulling out tube and it not being able to be replaced). -Patient's husband to reach out to our team if any further concerns with which we can be of assistance.  Total time: 60 minutes Greater than 50%  of this time was spent counseling and coordinating care related to the above assessment and plan.  Micheline Rough, MD [facility] [phone]

## 2019-10-17 NOTE — Progress Notes (Signed)
  PT HYDROTHERAPY TREATMENT NOTE     10/17/19 1400  Subjective Assessment  Subjective pt did not speak today  Patient and Family Stated Goals none stated  Date of Onset  (present on admission)  Evaluation and Treatment  Evaluation and Treatment Procedures Explained to Patient/Family Yes  Evaluation and Treatment Procedures Patient unable to consent due to mental status  Pressure Injury 09/17/19 Sacrum Medial;Left Unstageable - Full thickness tissue loss in which the base of the injury is covered by slough (yellow, tan, gray, green or brown) and/or eschar (tan, brown or black) in the wound bed. wound w eschar  Date First Assessed/Time First Assessed: 09/17/19 0245   Location: Sacrum  Location Orientation: Medial;Left  Staging: Unstageable - Full thickness tissue loss in which the base of the injury is covered by slough (yellow, tan, gray, green or brown) an...  Dressing Type Gauze ;Foam - Lift dressing to assess site every shift (Santyl; barrier cream)  Dressing Changed  Dressing Change Frequency Twice a day  State of Healing Non-healing  Site / Wound Assessment Bleeding;Pink;Red;Yellow;Brown;Black  % Wound base Red or Granulating 40%  % Wound base Yellow/Fibrinous Exudate 45%  % Wound base Black/Eschar 15%  Peri-wound Assessment Maceration;Erythema (non-blanchable)  Drainage Amount Minimal  Drainage Description Serosanguineous;Odor  Treatment Debridement (Selective);Hydrotherapy (Pulse lavage);Packing (Saline gauze) (Enzymatic debridement)  Hydrotherapy  Pulsed lavage therapy - wound location sacrum  Pulsed Lavage with Suction (psi) 8 psi  Pulsed Lavage with Suction - Normal Saline Used 1000 mL  Pulsed Lavage Tip Tip with splash shield  Selective Debridement  Selective Debridement - Location sacrum  Selective Debridement - Tools Used Forceps;Scissors  Selective Debridement - Tissue Removed yellow slough, grey eschar  Wound Therapy - Assess/Plan/Recommendations  Wound Therapy -  Clinical Statement 69 yo female admitted to hospital with weakness. Hx of CVA with L residual weakness, dementia, Parkinsons, sacral wounds, contractures. WC bound PTA.   Wound Therapy - Functional Problem List CVA with L residual weakness, contractures, Parkinson's  Factors Delaying/Impairing Wound Healing Incontinence;Immobility  Hydrotherapy Plan Debridement;Dressing change;Patient/family education;Pulsatile lavage with suction  Wound Therapy - Frequency 6X / week  Wound Therapy - Current Recommendations WOC nurse;Case manager/social work  Wound Therapy - Follow Up Recommendations [facility];Skilled nursing facility  Wound Plan Palliative following. Questionable Prognosis. Will plan to continue hydro, dressing changes. Will assess and update recommendations as needed.   Wound Therapy Goals - Improve the function of patient's integumentary system by progressing the wound(s) through the phases of wound healing by:  Decrease Necrotic Tissue to 30  Decrease Necrotic Tissue - Progress Progressing toward goal  Increase Granulation Tissue to 70  Increase Granulation Tissue - Progress Progressing toward goal  Goals/treatment plan/discharge plan were made with and agreed upon by patient/family No, Patient unable to participate in goals/treatment/discharge plan and family unavailable  Time For Goal Achievement 2 weeks  Wound Therapy - Potential for Goals Poor    Doreatha Massed, PT Acute Rehabilitation

## 2019-10-17 NOTE — Progress Notes (Signed)
Daily Progress Note   Patient Name: Barbara Flowers       Date: 10/17/2019 DOB: 1951/08/31  Age: 69 y.o. MRN#: [id number] Attending Physician: [name], MD Primary Care Physician: [name], NP Admit Date: 09/16/2019  Reason for Consultation/Follow-up: Establishing goals of care  Subjective: Patient remains in stepdown.  Called and discussed with husband as below:    Length of Stay: 31  Current Medications: Scheduled Meds:  . chlorhexidine  15 mL Mouth Rinse BID  . Chlorhexidine Gluconate Cloth  6 each Topical Daily  . collagenase   Topical Daily  . donepezil  10 mg Per Tube QHS  . feeding supplement (PRO-STAT SUGAR FREE 64)  30 mL Per Tube BID  . furosemide  40 mg Intravenous Daily  . Gerhardt's butt cream   Topical BID  . heparin  5,000 Units Subcutaneous Q8H  . mouth rinse  15 mL Mouth Rinse q12n4p  . potassium chloride  20 mEq Oral Daily  . sennosides  5 mL Per Tube BID    Continuous Infusions: . sodium chloride Stopped (10/16/19 1900)  . dextrose 5% lactated ringers 50 mL/hr at 10/17/19 0911  . feeding supplement (OSMOLITE 1.5 CAL) Stopped (10/16/19 1950)    Vital Signs: BP (!) 137/39   Pulse 75   Temp 97.9 F (36.6 C) (Oral)   Resp 14   Ht 5\' 8"  (1.727 m)   Wt 55 kg   SpO2 100%   BMI 18.44 kg/m  SpO2: SpO2: 100 % O2 Device: O2 Device: Room Air O2 Flow Rate: O2 Flow Rate (L/min): 2 L/min  Intake/output summary:   Intake/Output Summary (Last 24 hours) at 10/17/2019 1139 Last data filed at 10/17/2019 1000 Gross per 24 hour  Intake 687.75 ml  Output 2600 ml  Net -1912.25 ml   LBM: Last BM Date: 10/14/19 Baseline Weight: Weight: 68 kg Most recent weight: Weight: 55 kg       Palliative Assessment/Data:      Patient Active Problem List   Diagnosis Date Noted  . Palliative care by specialist   . Goals of care, counseling/discussion   . Generalized weakness   . Failure to thrive (0-17)   . Acute hypoxemic respiratory failure ([place])   . Pleural effusion   . Acute systolic heart failure ([place])   . Malnutrition of moderate  degree 09/24/2019  . Pressure injury of skin 09/17/2019  . CAP (community acquired pneumonia) 09/16/2019  . Pleural effusion on left 09/16/2019  . Acute prerenal azotemia 09/16/2019  . Hypernatremia 09/16/2019  . Diarrhea 05/24/2017  . Hypokalemia 05/24/2017  . TIA (transient ischemic attack) 08/31/2015  . History of CVA with residual deficit left side 08/31/2015  . Hypothyroidism 08/31/2015  . OSA on CPAP 08/31/2015  . Dementia ([place]) 08/31/2015  . Chronic orthostatic hypotension 08/31/2015  . OAB (overactive bladder) 08/31/2015  . 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  . Peripheral neuropathy 08/31/2015  . Avascular necrosis of right femoral head ([place]) 08/31/2015  . Left carotid bruit 08/31/2015  . Anemia 08/31/2015    Palliative Care Assessment & Plan  Assessment: Barbara Flowers is 70 years old female with prior breast cancer and stroke.  She has left-sided weakness and is wheelchair bound and resides at [facility].  She presented with cough congestion worsening shortness of breath and was admitted to [facility] in [place], [place] with extensive left-sided infiltrate and left-sided effusion with hypoxic respiratory failure.  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  Consideration at this point for placement of PEG tube.  Palliative medicine has been consulted for  overall goals of care discussions.  Recommendations/Plan: Attempted to call patient's husband, Barbara Flowers, to discuss.  We were able to connect, however, he reports it is difficult for him to understand at times due to poor connection.  Briefly discussed clinical course and family desire for PEG placement.  Discussed plan to await psychiatry input.  Plan: -Full code/full scope care. -Family desires all available life maintaining/life-prolonging therapies. Patient has been documented to say she does not want PEG tube placement.  Family would PEG to be placed despite this as they feel she does not have understanding or capacity to make this decision. Await psychiatry input.  Question of possible need for ethics input per surgery notes.     Goals of Care and Additional Recommendations:  Limitations on Scope of Treatment: Full Scope Treatment  Code Status:    Code Status Orders  (From admission, onward)         Start     Ordered   09/16/19 1737  Full code  Continuous     09/16/19 1740        Code Status History    Date Active Date Inactive Code Status Order ID Comments User Context   05/24/2017 2004 05/28/2017 2035 Full Code [id number]  Etta Quill, DO ED   08/31/2015 1641 09/02/2015 2016 Full Code [id number]  Samella Parr, NP Inpatient   Advance Care Planning Activity    Advance Directive Documentation     Most Recent Value  Type of Advance Directive  Healthcare Power of [place], Living will  Pre-existing out of facility DNR order (yellow form or pink MOST form)  --  "MOST" Form in Place?  --       Prognosis:   Unable to determine  Discharge Planning:  To Be Determined  Care plan was discussed with patient's husband, also discussed with bedside RN via phone.   Thank you for allowing the Palliative Medicine Team to assist in the care of this patient.   Time In: 11 Time Out: 1125 Total  Time 25 Prolonged Time Billed  no   The above conversation was completed via  telephone. Thorough chart review and discussion with necessary members of the care team was completed as part of assessment. All issues were discussed and addressed but no physical exam was performed.    Greater than 50%  of this time was spent counseling and coordinating care related to the above assessment and plan.  Micheline Rough, MD  Please contact Palliative Medicine Team phone at [phone] for questions and concerns.

## 2019-10-17 NOTE — Progress Notes (Signed)
Barbara Fusselman MD:[id number] 07-Apr-1951  CARE TEAM:  PCP: [name], NP  Outpatient Care Team: Patient Care Team: [name], NP as PCP - General (Nurse Practitioner) [name], MD as Referring Physician (Gastroenterology) [name], MD as Referring Physician (Internal Medicine) [name], MD as Referring Physician (Neurology) [name], [name]., MD as Referring Physician (Cardiology)  Inpatient Treatment Team: Treatment Team: Attending Provider: [name], MD; Occupational Therapist: [name], OT; [place] Nurse: [name], RN; Rounding Team: [name], MD; Registered Nurse: [name], RN; Consulting Physician: [name], Md, MD; Case Manager: Frann Rider, RN; Utilization Review: Orlean Bradford, RN   Problem List:   Active Problems:   Failure to thrive in adult   History of CVA with residual deficit left side   Wheelchair dependent 2017-   Dementia ([place])   CAP (community acquired pneumonia)   Pleural effusion on left   Acute prerenal azotemia   Hypernatremia   Pressure injury of skin   Malnutrition of moderate degree   Acute hypoxemic respiratory failure ([place])   Pleural effusion   Acute systolic heart failure ([place])   Palliative care by specialist   Goals of care, counseling/discussion   Generalized weakness   Parkinson's disease ([place])   Sacral decubitus ulcer, stage IV ([place])   H/O bone marrow transplant ([place])   History of Roux-en-Y gastric bypass   Dysphagia   Kyphoscoliosis      * No surgery found *      Assessment  Severe dysphagia in the setting of multiple medical problems and prolonged hospitalization from pneumonia most likely related to aspiration event.  [facility] Stay = 31 days)  Plan:  Patient's husband and children desire feeding gastrostomy tube in the hope of giving her the capacity to have full nutrition in her medications in the hopes that she can have some recovery and quality of life.   I had a long conversation with the patient and especially her husband who was in the room.  Patient formally evaluated by psychiatry and they feel that she does not have mental capacity.  Primary service agrees.  Patient sitting up in chair.  She is obviously tired.  Lightly whispering.  She will nod yes to simple questions that her husband asks.  She nodded yes to wanting to go home.  She nodded yes to wanting to live.  She nodded yes to the idea of having a feeding tube to allow her to do that.  She has never had a prior feeding tube.  I reviewed her chart extensively and her significant past medical history.  History and physical exam as well.  She has been through quite a lot with her breast cancer requiring multidisciplinary treatment including bone marrow transplant, TIA/strokes, recurrent brain tumors with stereotactic treatment and then excision, limited mobility and now wheelchair-bound for the past 4 years.  The patient is wanted to fight for a long time, and it is obvious that the family has worked diligently to help make that happen.  While the patient has intermittently expressed not wanting to continue the fight at times this admission, she has had issues with delirium and confusion at times.  Apparently at other times she is wished to try and recover.  I stressed the importance of making sure that we are doing what the patient would want and is in her best interest.  Patient's husband feels like she has had some quality of life at home and wishes to give  her a chance to get back to that.  I noted that I am concerned that her prognosis is poor with her large sacral decubitus and persistent dysphagia as poor prognostic indicators and failure to thrive.  He was hoping to have feeding tube placement to allow her to get her nutrition and give her a chance to go home.  She refuses a nasal feeding tube again.  The patient's husband understands that prognosis is poor and this is her last chance in any  recovery or stabilization.  He does note that it takes longer for her to have any recovery with each admission or setback.  He again discussed with Dr Domingo Cocking with palliative care this afternoon.  I discussed with Dr. Domingo Cocking with palliative care as well. It is not unreasonable to set a goal of two weeks to see if things can improve at home.  If not, revisit the idea of hospice and supportive care to not prolong her suffering.    I will work to place a feeding tube in the morning.  Start laparoscopically to avoid a larger incision with risks of dehiscence given her severely malnourished state.  She has had a prior Roux-en-Y gastric bypass decades ago making endoscopic or radiographic approaches not feasible.  Her stomach remnant is shrunken, but I am guardedly optimistic that I can mobilize it and bring it up to the left upper quadrant.  Otherwise, may need a feeding jejunostomy tube which will be challenged by not able to put his large of a caliber tube with increased risks of dislodgment and occlusion.  I cautioned that there are risks of leak given her severely malnourished and deconditioned state.  She could pull out the gastrostomy tube as she is pulled out nasogastric tubes.  Risk of leak and peritonitis elevated until the tract is well incorporated by a month.  Hopefully the risk of be somewhat less since it is a blind gastric remnant pouch.  There is increased risk that she would need to be reintubated.  Risks of stroke, heart attack, bleeding, death, need for gastric tube replacement, reoperation, and other risks discussed.  Patient's husband understands these risks.  He wants to give a chance for her to have a feeding tube & transition to home.  He wishes her to be full code for now.  I encouraged him to discuss about limited code vs DNR now or in the near future.  I updated [facility] ICU nursing leadership as well.     -VTE prophylaxis- SCDs, etc -mobilize as tolerated to help recovery  90 minutes  spent in review, evaluation, examination, counseling, and coordination of care.  More than 50% of that time was spent in counseling.  10/17/2019    Subjective: (Chief complaint)  Patient sitting up in chair.  Leaning somewhat.  Husband in room playing solitaire with her [place] just outside room.  Objective:  Vital signs:  Vitals:   10/17/19 1200 10/17/19 1300 10/17/19 1400 10/17/19 1527  BP:    (!) 128/49  Pulse: 73 79 72 75  Resp: 15 15 20  (!) 22  Temp: 98.2 F (36.8 C)     TempSrc: Oral     SpO2: 100% 94% 94% 95%  Weight:      Height:        Last BM Date: 10/14/19  Intake/Output   Yesterday:  01/25 0701 - 01/26 0700 In: 1260.1 [I.V.:206.6; NG/GT:1053.5] Out: 2600 [Urine:2600] This shift:  Total I/O In: 239.4 [I.V.:239.4] Out: -  Physical Exam:  General: Patient awake.  Tired but not toxic.  Very cachectic Eyes: PERRL, normal EOM.  Sclera clear.  No icterus Lymph: No head/neck/groin lymphadenopathy Psych: Oriented to self mainly.  Recognizes her husband.  Not agitated. HENT: Normocephalic, Mucus membranes dry.  Moderate hair loss .  No thrush Neck: Supple, No tracheal deviation.  No obvious thyromegaly Chest: No audible wheezing no chest wall pain w good excursion CV:  Pulses intact.  Regular rhythm MS: Normal AROM mjr joints.  No obvious deformity  Abdomen: Soft.  Nondistended.  Nontender.  No evidence of peritonitis.  No incarcerated hernias.  Skin: No petechiae / purpura.  Hands warm and dry  Results:   Cultures: Recent Results (from the past 720 hour(s))  Body fluid culture (includes gram stain)     Status: None   Collection Time: 09/18/19 11:01 AM   Specimen: Pleural Fluid  Result Value Ref Range Status   Specimen Description   Final    PLEURAL Performed at [place] [address] [place], [place] [address]    Special Requests   Final    NONE Performed at [facility], [place]  [address] [place], [place] [address]    Gram Stain   Final    ABUNDANT WBC PRESENT, PREDOMINANTLY PMN NO ORGANISMS SEEN    Culture   Final    NO GROWTH 3 DAYS Performed at [facility], [place] [address], [place], [place] [address]    Report Status 09/22/2019 FINAL  Final  Culture, body fluid-bottle     Status: None   Collection Time: 09/23/19 12:30 PM   Specimen: Pleura  Result Value Ref Range Status   Specimen Description PLEURAL  Final   Special Requests NONE  Final   Culture   Final    NO GROWTH 5 DAYS Performed at [place] [address] [place], [place] [address]    Report Status 09/28/2019 FINAL  Final  Gram stain     Status: None   Collection Time: 09/23/19 12:30 PM   Specimen: Pleura  Result Value Ref Range Status   Specimen Description PLEURAL  Final   Special Requests NONE  Final   Gram Stain   Final    NO WBC SEEN NO ORGANISMS SEEN Performed at [facility], [place] [address], [place], [place] [address]    Report Status 09/24/2019 FINAL  Final  Culture, respiratory (non-expectorated)     Status: None   Collection Time: 09/27/19  8:01 AM   Specimen: Tracheal Aspirate; Respiratory  Result Value Ref Range Status   Specimen Description   Final    TRACHEAL ASPIRATE Performed at [facility] [address] [place], [place] [address]    Special Requests   Final    NONE Performed at [facility], [place] [address] [place], [place] [address]    Gram Stain   Final    RARE WBC PRESENT, PREDOMINANTLY PMN RARE GRAM POSITIVE COCCI IN PAIRS Performed at [facility], [place] [address], [place],  [address]    Culture FEW METHICILLIN RESISTANT STAPHYLOCOCCUS AUREUS  Final   Report Status 09/29/2019 FINAL  Final   Organism ID, Bacteria METHICILLIN RESISTANT STAPHYLOCOCCUS AUREUS  Final      Susceptibility   Methicillin resistant staphylococcus aureus - MIC*    CIPROFLOXACIN >=8 RESISTANT Resistant      ERYTHROMYCIN >=8 RESISTANT Resistant     GENTAMICIN <=0.5 SENSITIVE  Sensitive     OXACILLIN >=4 RESISTANT Resistant     TETRACYCLINE 2 SENSITIVE Sensitive     VANCOMYCIN 1 SENSITIVE Sensitive     TRIMETH/SULFA <=10 SENSITIVE Sensitive     CLINDAMYCIN >=8 RESISTANT Resistant     RIFAMPIN <=0.5 SENSITIVE Sensitive     Inducible Clindamycin NEGATIVE Sensitive     * FEW METHICILLIN RESISTANT STAPHYLOCOCCUS AUREUS  MRSA PCR Screening     Status: Abnormal   Collection Time: 10/09/19  9:21 AM   Specimen: Nasal Mucosa; Nasopharyngeal  Result Value Ref Range Status   MRSA by PCR POSITIVE (A) NEGATIVE Final    Comment:        The GeneXpert MRSA Assay (FDA approved for NASAL specimens only), is one component of a comprehensive MRSA colonization surveillance program. It is not intended to diagnose MRSA infection nor to guide or monitor treatment for MRSA infections. RESULT CALLED TO, READ BACK BY AND VERIFIED WITH: ROMINES,H. RN @1531  10/09/19 BILLINGSLEY,L Performed at [facility], [place] [address], [place], [place] [address]     Labs: Results for orders placed or performed during the hospital encounter of 09/16/19 (from the past 48 hour(s))  Glucose, capillary     Status: Abnormal   Collection Time: 10/15/19  7:34 PM  Result Value Ref Range   Glucose-Capillary 117 (H) 70 - 99 mg/dL  Glucose, capillary     Status: Abnormal   Collection Time: 10/15/19 11:27 PM  Result Value Ref Range   Glucose-Capillary 117 (H) 70 - 99 mg/dL  CBC with Differential/Platelet     Status: Abnormal   Collection Time: 10/16/19  2:28 AM  Result Value Ref Range   WBC 14.2 (H) 4.0 - 10.5 K/uL   RBC 3.03 (L) 3.87 - 5.11 MIL/uL   Hemoglobin 8.7 (L) 12.0 - 15.0 g/dL   HCT 29.4 (L) 36.0 - 46.0 %   MCV 97.0 80.0 - 100.0 fL   MCH 28.7 26.0 - 34.0 pg   MCHC 29.6 (L) 30.0 - 36.0 g/dL   RDW 14.8 11.5 - 15.5 %   Platelets 269 150 - 400 K/uL   nRBC 0.0 0.0 - 0.2 %   Neutrophils Relative % 73 %     Neutro Abs 10.3 (H) 1.7 - 7.7 K/uL   Lymphocytes Relative 17 %   Lymphs Abs 2.4 0.7 - 4.0 K/uL   Monocytes Relative 7 %   Monocytes Absolute 1.0 0.1 - 1.0 K/uL   Eosinophils Relative 1 %   Eosinophils Absolute 0.2 0.0 - 0.5 K/uL   Basophils Relative 1 %   Basophils Absolute 0.1 0.0 - 0.1 K/uL   Immature Granulocytes 1 %   Abs Immature Granulocytes 0.17 (H) 0.00 - 0.07 K/uL    Comment: Performed at [facility], [place] [address] [place], [place] [address]  Comprehensive metabolic panel     Status: Abnormal   Collection Time: 10/16/19  2:28 AM  Result Value Ref Range   Sodium 138 135 - 145 mmol/L   Potassium 4.7 3.5 - 5.1 mmol/L   Chloride 100 98 - 111 mmol/L   CO2 28 22 - 32 mmol/L   Glucose, Bld 146 (H) 70 - 99 mg/dL   BUN 34 (H) 8 - 23 mg/dL   Creatinine, Ser 0.62 0.44 - 1.00 mg/dL   Calcium 8.3 (L) 8.9 - 10.3 mg/dL   Total Protein 5.9 (L) 6.5 - 8.1 g/dL   Albumin 2.3 (L) 3.5 - 5.0 g/dL   AST 24  15 - 41 U/L   ALT 21 0 - 44 U/L   Alkaline Phosphatase 72 38 - 126 U/L   Total Bilirubin 0.6 0.3 - 1.2 mg/dL   GFR calc non Af Amer >60 >60 mL/min   GFR calc Af Amer >60 >60 mL/min   Anion gap 10 5 - 15    Comment: Performed at [facility], [place] [address] [place], [place] [address]  Glucose, capillary     Status: Abnormal   Collection Time: 10/16/19  3:23 AM  Result Value Ref Range   Glucose-Capillary 143 (H) 70 - 99 mg/dL  Glucose, capillary     Status: Abnormal   Collection Time: 10/16/19  7:52 AM  Result Value Ref Range   Glucose-Capillary 118 (H) 70 - 99 mg/dL   Comment 1 Notify RN    Comment 2 Document in Chart   Glucose, capillary     Status: Abnormal   Collection Time: 10/16/19 11:39 AM  Result Value Ref Range   Glucose-Capillary 113 (H) 70 - 99 mg/dL   Comment 1 Notify RN    Comment 2 Document in Chart   Glucose, capillary     Status: Abnormal   Collection Time: 10/16/19  3:33 PM  Result Value Ref Range   Glucose-Capillary  121 (H) 70 - 99 mg/dL   Comment 1 Notify RN    Comment 2 Document in Chart   Glucose, capillary     Status: Abnormal   Collection Time: 10/16/19  7:55 PM  Result Value Ref Range   Glucose-Capillary 122 (H) 70 - 99 mg/dL  Glucose, capillary     Status: Abnormal   Collection Time: 10/16/19 11:30 PM  Result Value Ref Range   Glucose-Capillary 102 (H) 70 - 99 mg/dL  CBC with Differential/Platelet     Status: Abnormal   Collection Time: 10/17/19  2:48 AM  Result Value Ref Range   WBC 14.8 (H) 4.0 - 10.5 K/uL   RBC 3.32 (L) 3.87 - 5.11 MIL/uL   Hemoglobin 9.8 (L) 12.0 - 15.0 g/dL   HCT 32.3 (L) 36.0 - 46.0 %   MCV 97.3 80.0 - 100.0 fL   MCH 29.5 26.0 - 34.0 pg   MCHC 30.3 30.0 - 36.0 g/dL   RDW 15.2 11.5 - 15.5 %   Platelets 282 150 - 400 K/uL   nRBC 0.0 0.0 - 0.2 %   Neutrophils Relative % 73 %   Neutro Abs 10.9 (H) 1.7 - 7.7 K/uL   Lymphocytes Relative 17 %   Lymphs Abs 2.4 0.7 - 4.0 K/uL   Monocytes Relative 7 %   Monocytes Absolute 1.0 0.1 - 1.0 K/uL   Eosinophils Relative 1 %   Eosinophils Absolute 0.2 0.0 - 0.5 K/uL   Basophils Relative 1 %   Basophils Absolute 0.1 0.0 - 0.1 K/uL   Immature Granulocytes 1 %   Abs Immature Granulocytes 0.13 (H) 0.00 - 0.07 K/uL    Comment: Performed at [facility], [place] [address], [place], [place] [address]  Comprehensive metabolic panel     Status: Abnormal   Collection Time: 10/17/19  2:48 AM  Result Value Ref Range   Sodium 139 135 - 145 mmol/L   Potassium 4.9 3.5 - 5.1 mmol/L   Chloride 97 (L) 98 - 111 mmol/L   CO2 32 22 - 32 mmol/L   Glucose, Bld 101 (H) 70 - 99 mg/dL   BUN 37 (H) 8 - 23 mg/dL   Creatinine,  Ser 0.59 0.44 - 1.00 mg/dL   Calcium 8.8 (L) 8.9 - 10.3 mg/dL   Total Protein 6.8 6.5 - 8.1 g/dL   Albumin 2.7 (L) 3.5 - 5.0 g/dL   AST 32 15 - 41 U/L   ALT 27 0 - 44 U/L   Alkaline Phosphatase 80 38 - 126 U/L   Total Bilirubin 0.8 0.3 - 1.2 mg/dL   GFR calc non Af Amer >60 >60 mL/min   GFR calc Af  Amer >60 >60 mL/min   Anion gap 10 5 - 15    Comment: Performed at [facility], [place] [address], [place], [place] [address]  Glucose, capillary     Status: None   Collection Time: 10/17/19  3:26 AM  Result Value Ref Range   Glucose-Capillary 90 70 - 99 mg/dL  Glucose, capillary     Status: None   Collection Time: 10/17/19  6:57 AM  Result Value Ref Range   Glucose-Capillary 84 70 - 99 mg/dL  Glucose, capillary     Status: None   Collection Time: 10/17/19  7:51 AM  Result Value Ref Range   Glucose-Capillary 81 70 - 99 mg/dL   Comment 1 Notify RN    Comment 2 Document in Chart   Glucose, capillary     Status: None   Collection Time: 10/17/19 12:24 PM  Result Value Ref Range   Glucose-Capillary 98 70 - 99 mg/dL   Comment 1 Notify RN    Comment 2 Document in Chart     Imaging / Studies: CT HEAD WO CONTRAST  Result Date: 10/17/2019 CLINICAL DATA:  Altered mental status.  Dysphagia EXAM: CT HEAD WITHOUT CONTRAST TECHNIQUE: Contiguous axial images were obtained from the base of the skull through the vertex without intravenous contrast. COMPARISON:  CT head 10/07/2019 FINDINGS: Brain: Moderate to advanced atrophy. Chronic microvascular ischemic changes in the white matter. Chronic ischemic change in the pons. Chronic infarct left thalamus. Chronic ischemic changes in the cerebellum bilaterally. Right parietal craniotomy. Parenchymal calcification at the craniotomy site is chronic and unchanged. Negative for acute infarct, hemorrhage, mass Vascular: Normal arterial flow voids. Skull: Right parietal craniotomy. Sinuses/Orbits: Mild mucosal edema paranasal sinuses. Bilateral mastoid effusion. Bilateral cataract extraction. Other: None IMPRESSION: Atrophy and extensive chronic ischemia. No acute abnormality and no change from the prior CT. Electronically Signed   By: Franchot Gallo M.D.   On: 10/17/2019 12:44    Medications / Allergies: per chart  Antibiotics: Anti-infectives  (From admission, onward)   Start     Dose/Rate Route Frequency Ordered Stop   10/18/19 0600  cefoTEtan (CEFOTAN) 2 g in sodium chloride 0.9 % 100 mL IVPB     2 g 200 mL/hr over 30 Minutes Intravenous On call to O.R. 10/17/19 1640 10/19/19 0559   10/10/19 1100  vancomycin (VANCOCIN) IVPB 1000 mg/200 mL premix     1,000 mg 200 mL/hr over 60 Minutes Intravenous Every 24 hours 10/09/19 0922 10/15/19 1220   10/09/19 1030  vancomycin (VANCOREADY) IVPB 1250 mg/250 mL     1,250 mg 166.7 mL/hr over 90 Minutes Intravenous  Once 10/09/19 0911 10/09/19 1458   10/09/19 1000  Ampicillin-Sulbactam (UNASYN) 3 g in sodium chloride 0.9 % 100 mL IVPB     3 g 200 mL/hr over 30 Minutes Intravenous Every 6 hours 10/09/19 0901 10/15/19 2232   10/02/19 2100  vancomycin (VANCOCIN) IVPB 1000 mg/200 mL premix  Status:  Discontinued     1,000 mg 200 mL/hr over 60  Minutes Intravenous Every 24 hours 10/02/19 1404 10/05/19 0849   09/29/19 1400  vancomycin (VANCOREADY) IVPB 1500 mg/300 mL  Status:  Discontinued     1,500 mg 150 mL/hr over 120 Minutes Intravenous Every 24 hours 09/28/19 1228 10/02/19 1404   09/28/19 1300  vancomycin (VANCOREADY) IVPB 1750 mg/350 mL     1,750 mg 175 mL/hr over 120 Minutes Intravenous  Once 09/28/19 1228 09/28/19 1451   09/17/19 1000  azithromycin (ZITHROMAX) 500 mg in sodium chloride 0.9 % 250 mL IVPB  Status:  Discontinued     500 mg 250 mL/hr over 60 Minutes Intravenous Every 24 hours 09/17/19 0920 09/18/19 1157   09/16/19 2200  cefTRIAXone (ROCEPHIN) 2 g in sodium chloride 0.9 % 100 mL IVPB     2 g 200 mL/hr over 30 Minutes Intravenous Every 24 hours 09/16/19 1745 09/29/19 2216   09/16/19 1745  cefTRIAXone (ROCEPHIN) injection 1 g  Status:  Discontinued     1 g Intramuscular Every 24 hours 09/16/19 1740 09/16/19 1743   09/16/19 1515  ceFEPIme (MAXIPIME) 2 g in sodium chloride 0.9 % 100 mL IVPB     2 g 200 mL/hr over 30 Minutes Intravenous  Once 09/16/19 1513 09/16/19 1834    09/16/19 1515  metroNIDAZOLE (FLAGYL) IVPB 500 mg  Status:  Discontinued     500 mg 100 mL/hr over 60 Minutes Intravenous  Once 09/16/19 1513 09/16/19 1741   09/16/19 1515  vancomycin (VANCOCIN) IVPB 1000 mg/200 mL premix  Status:  Discontinued     1,000 mg 200 mL/hr over 60 Minutes Intravenous  Once 09/16/19 1513 09/16/19 1834        Note: Portions of this report may have been transcribed using voice recognition software. Every effort was made to ensure accuracy; however, inadvertent computerized transcription errors may be present.   Any transcriptional errors that result from this process are unintentional.     Adin Hector, MD, FACS, MASCRS Gastrointestinal and Minimally Invasive Surgery    [address] [place], [place] [address] [phone] Main / Paging [phone] Fax Please see Amion for pager number, especial 5pm - 7am.

## 2019-10-17 NOTE — Consult Note (Signed)
[facility] Face-to-Face Psychiatry Consult   Reason for Consult:  Capacity  Referring Physician:  Dr Sloan Leiter Patient Identification: Barbara Flowers MRN:  [id number] Principal Diagnosis:  Diagnosis:  Active Problems:   Dementia ([place])   CAP (community acquired pneumonia)   Pleural effusion on left   Acute prerenal azotemia   Hypernatremia   Pressure injury of skin   Malnutrition of moderate degree   Acute hypoxemic respiratory failure (HCC)   Pleural effusion   Acute systolic heart failure (HCC)   Failure to thrive (0-17)   Palliative care by specialist   Goals of care, counseling/discussion   Generalized weakness  Total Time spent with patient: 1 hour  Subjective:   Barbara Flowers is a 69 y.o. female patient admitted with pneumonia, consult for capacity.  Patient seen and evaluated in person by this provider.  Her husband is at her bedside and the provides most of the history.  She reports that she has been through many medical trials and has recovered.  She had breast cancer in 1990 was given 3 years to live and survived.  Diagnosis of dementia and has been wheelchair-bound for the past 3 years.  Prior to admission for pneumonia at this time, he reports that they could still play cards and she had a quality life. Husband reports she is slow to respond to treatment due to her weak immune system.  He and her two children feel that she will recover (possibly) but will take time. He has spoken to palliative care and understands she may return home and pass or improve.  The husband is her sole caregiver and dedicated to her care, daughter is a NP and lives in the home and can assist.  Her son is a Publishing rights manager in [place].  The husband desires to take her home and care for her whether she lives of dies.  To assist her coming home, she has to have a feeding tube as she keeps removing her NG tube as it was irritating her.  He feels when they ask her if she wants the operation, she says no but does not  understand this means she cannot return home.  Patient had to be turned by the nurse to awaken enough to respond to this provider.  She barely could keep her eyes open.  She did agree to the tube with the goal of her returning home.  Based on the interview, however, she does not have capacity to make medical decisions.  Not sure how much she comprehends and only responded with yes. Her history of dementia also confounds the issue. Her husband is allegedly her POA and wants the feeding tube along with her children.  Dr Dwyane Dee has reviewed this case and concurs with the patient having lack of capacity to make decisions at this time.  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  HISTORY OF PRESENT ILLNESS   All hx from husband as pt non-verbal   Past Psychiatric History: dementia, depression  Risk to Self:  none Risk to Others:  none Prior Inpatient Therapy:  none Prior Outpatient Therapy:  none  Past Medical History:  Past Medical History:  Diagnosis Date  . Anemia   . Anemia   . Anxiety disorder   . Blood transfusion without reported diagnosis   . Brain cancer [facility])    s/p sterotactic radio surgery  . Brain tumor ([place])   . Breast cancer ([place])   . Cancer ([place]) 1990   L breast  . H/O bone marrow transplant ([place])   . Hypothyroidism   . IBS (irritable bowel syndrome)   . MDD (major depressive disorder)   . Memory loss   . Obesity   . Orthostatic hypotension   . Seizure ([place])   . Seizures ([place])   . Stroke [facility]) 2005    hemorrhagic  . Stroke ([place])   . Subdural hematoma ([place])   . Upper brachial plexus paralysis syndrome   . Urinary incontinence     Past Surgical History:  Procedure Laterality Date  . BREAST LUMPECTOMY Left    post chemotherapy and radiation  . CHOLECYSTECTOMY    . CRANIOTOMY FOR HEMISPHERECTOMY TOTAL / PARTIAL Right   . GASTRIC BYPASS  1985  . HAND SURGERY Left 2015  . HAND TENDON SURGERY Left    for brachial plexus injury  . LYMPH NODE BIOPSY     Family History:  Family History  Problem Relation Age of Onset  . Hypertension Mother   . Stroke Mother   . Heart disease Father   . Hypertension Father    Family Psychiatric  History: none Social History:  Social History   Substance and Sexual Activity  Alcohol Use Yes  . Alcohol/week: 0.0 standard drinks   Comment: socially     Social History   Substance and Sexual Activity  Drug Use Never    Social History   Socioeconomic History  . Marital status: Married    Spouse name: Not on file  . Number of children: 1  . Years of education: Not on file  . Highest education level: Not on file  Occupational History  . Not on file  Tobacco Use  . Smoking status: Never Smoker  . Smokeless tobacco: Never Used  Substance and Sexual Activity  . Alcohol use: Yes    Alcohol/week: 0.0 standard drinks    Comment: socially  . Drug use: Never  . Sexual activity: Not on file  Other Topics Concern  . Not on file  Social History Narrative   Patient lives at home with her family.   Caffeine Use: Yes   Social Determinants of Health   Financial Resource Strain:   . Difficulty of Paying Living Expenses: Not on file  Food Insecurity:   . Worried About Charity fundraiser in the Last Year: Not on file  . Ran Out of Food in the Last Year: Not on file  Transportation Needs:   . Lack of Transportation (Medical): Not on file  . Lack of Transportation (Non-Medical): Not on file  Physical Activity:   . Days of Exercise per Week:  Not on file  . Minutes of Exercise per Session: Not on file  Stress:   . Feeling of Stress : Not on file  Social Connections:   . Frequency of Communication with Friends and Family: Not on file  . Frequency of Social Gatherings with Friends and Family: Not on file  . Attends Religious Services: Not on file  . Active  Member of Clubs or Organizations: Not on file  . Attends Archivist Meetings: Not on file  . Marital Status: Not on file   Additional Social History:    Allergies:   Allergies  Allergen Reactions  . Gabapentin Other (See Comments)    seizure  . Tramadol Other (See Comments)    Brings on Seizures     Labs:  Results for orders placed or performed during the hospital encounter of 09/16/19 (from the past 48 hour(s))  Glucose, capillary     Status: Abnormal   Collection Time: 10/15/19 12:04 PM  Result Value Ref Range   Glucose-Capillary 151 (H) 70 - 99 mg/dL  Glucose, capillary     Status: Abnormal   Collection Time: 10/15/19  3:50 PM  Result Value Ref Range   Glucose-Capillary 112 (H) 70 - 99 mg/dL   Comment 1 Notify RN    Comment 2 Document in Chart   Glucose, capillary     Status: Abnormal   Collection Time: 10/15/19  7:34 PM  Result Value Ref Range   Glucose-Capillary 117 (H) 70 - 99 mg/dL  Glucose, capillary     Status: Abnormal   Collection Time: 10/15/19 11:27 PM  Result Value Ref Range   Glucose-Capillary 117 (H) 70 - 99 mg/dL  CBC with Differential/Platelet     Status: Abnormal   Collection Time: 10/16/19  2:28 AM  Result Value Ref Range   WBC 14.2 (H) 4.0 - 10.5 K/uL   RBC 3.03 (L) 3.87 - 5.11 MIL/uL   Hemoglobin 8.7 (L) 12.0 - 15.0 g/dL   HCT 29.4 (L) 36.0 - 46.0 %   MCV 97.0 80.0 - 100.0 fL   MCH 28.7 26.0 - 34.0 pg   MCHC 29.6 (L) 30.0 - 36.0 g/dL   RDW 14.8 11.5 - 15.5 %   Platelets 269 150 - 400 K/uL   nRBC 0.0 0.0 - 0.2 %   Neutrophils Relative % 73 %   Neutro Abs 10.3 (H) 1.7 - 7.7 K/uL   Lymphocytes Relative 17 %   Lymphs  Abs 2.4 0.7 - 4.0 K/uL   Monocytes Relative 7 %   Monocytes Absolute 1.0 0.1 - 1.0 K/uL   Eosinophils Relative 1 %   Eosinophils Absolute 0.2 0.0 - 0.5 K/uL   Basophils Relative 1 %   Basophils Absolute 0.1 0.0 - 0.1 K/uL   Immature Granulocytes 1 %   Abs Immature Granulocytes 0.17 (H) 0.00 - 0.07 K/uL    Comment: Performed at [facility], [place] [address] [place], [place] [address]  Comprehensive metabolic panel     Status: Abnormal   Collection Time: 10/16/19  2:28 AM  Result Value Ref Range   Sodium 138 135 - 145 mmol/L   Potassium 4.7 3.5 - 5.1 mmol/L   Chloride 100 98 - 111 mmol/L   CO2 28 22 - 32 mmol/L   Glucose, Bld 146 (H) 70 - 99 mg/dL   BUN 34 (H) 8 - 23 mg/dL   Creatinine, Ser 0.62 0.44 - 1.00 mg/dL   Calcium 8.3 (L) 8.9 - 10.3 mg/dL   Total Protein 5.9 (L) 6.5 - 8.1 g/dL   Albumin 2.3 (L) 3.5 - 5.0 g/dL   AST 24 15 - 41 U/L   ALT 21 0 - 44 U/L   Alkaline Phosphatase 72 38 - 126 U/L   Total Bilirubin 0.6 0.3 - 1.2 mg/dL   GFR calc non Af Amer >60 >60 mL/min   GFR  calc Af Amer >60 >60 mL/min   Anion gap 10 5 - 15    Comment: Performed at [facility], [place] [address], [place], [place] [address]  Glucose, capillary     Status: Abnormal   Collection Time: 10/16/19  3:23 AM  Result Value Ref Range   Glucose-Capillary 143 (H) 70 - 99 mg/dL  Glucose, capillary     Status: Abnormal   Collection Time: 10/16/19  7:52 AM  Result Value Ref Range   Glucose-Capillary 118 (H) 70 - 99 mg/dL   Comment 1 Notify RN    Comment 2 Document in Chart   Glucose, capillary     Status: Abnormal   Collection Time: 10/16/19 11:39 AM  Result Value Ref Range   Glucose-Capillary 113 (H) 70 - 99 mg/dL   Comment 1 Notify RN    Comment 2 Document in Chart   Glucose, capillary     Status: Abnormal   Collection Time: 10/16/19  3:33 PM  Result Value Ref Range   Glucose-Capillary 121 (H) 70 - 99 mg/dL   Comment 1 Notify RN    Comment 2 Document in Chart    Glucose, capillary     Status: Abnormal   Collection Time: 10/16/19  7:55 PM  Result Value Ref Range   Glucose-Capillary 122 (H) 70 - 99 mg/dL  Glucose, capillary     Status: Abnormal   Collection Time: 10/16/19 11:30 PM  Result Value Ref Range   Glucose-Capillary 102 (H) 70 - 99 mg/dL  CBC with Differential/Platelet     Status: Abnormal   Collection Time: 10/17/19  2:48 AM  Result Value Ref Range   WBC 14.8 (H) 4.0 - 10.5 K/uL   RBC 3.32 (L) 3.87 - 5.11 MIL/uL   Hemoglobin 9.8 (L) 12.0 - 15.0 g/dL   HCT 32.3 (L) 36.0 - 46.0 %   MCV 97.3 80.0 - 100.0 fL   MCH 29.5 26.0 - 34.0 pg   MCHC 30.3 30.0 - 36.0 g/dL   RDW 15.2 11.5 - 15.5 %   Platelets 282 150 - 400 K/uL   nRBC 0.0 0.0 - 0.2 %   Neutrophils Relative % 73 %   Neutro Abs 10.9 (H) 1.7 - 7.7 K/uL   Lymphocytes Relative 17 %   Lymphs Abs 2.4 0.7 - 4.0 K/uL   Monocytes Relative 7 %   Monocytes Absolute 1.0 0.1 - 1.0 K/uL   Eosinophils Relative 1 %   Eosinophils Absolute 0.2 0.0 - 0.5 K/uL   Basophils Relative 1 %   Basophils Absolute 0.1 0.0 - 0.1 K/uL   Immature Granulocytes 1 %   Abs Immature Granulocytes 0.13 (H) 0.00 - 0.07 K/uL    Comment: Performed at [facility], [place] [address], [place], [place] [address]  Comprehensive metabolic panel     Status: Abnormal   Collection Time: 10/17/19  2:48 AM  Result Value Ref Range   Sodium 139 135 - 145 mmol/L   Potassium 4.9 3.5 - 5.1 mmol/L   Chloride 97 (L) 98 - 111 mmol/L   CO2 32 22 - 32 mmol/L   Glucose, Bld 101 (H) 70 - 99 mg/dL   BUN 37 (H) 8 - 23 mg/dL   Creatinine, Ser 0.59 0.44 - 1.00 mg/dL   Calcium 8.8 (L) 8.9 - 10.3 mg/dL   Total Protein 6.8 6.5 - 8.1 g/dL   Albumin 2.7 (L) 3.5 - 5.0 g/dL   AST 32 15 - 41 U/L  ALT 27 0 - 44 U/L   Alkaline Phosphatase 80 38 - 126 U/L   Total Bilirubin 0.8 0.3 - 1.2 mg/dL   GFR calc non Af Amer >60 >60 mL/min   GFR calc Af Amer >60 >60 mL/min   Anion gap 10 5 - 15    Comment: Performed at [facility], [place] [address], [place], [place] [address]  Glucose, capillary     Status: None   Collection Time: 10/17/19  3:26 AM  Result Value Ref Range   Glucose-Capillary 90 70 - 99 mg/dL    Current Facility-Administered Medications  Medication Dose Route Frequency Provider Last Rate Last Admin  . 0.9 %  sodium chloride infusion   Intravenous PRN Donne Hazel, MD   Stopped at 10/16/19 1900  . acetaminophen (TYLENOL) 160 MG/5ML solution 650 mg  650 mg Per Tube Q4H PRN Erick Colace, NP   650 mg at 10/15/19 2159  . albuterol (PROVENTIL) (2.5 MG/3ML) 0.083% nebulizer solution 2.5 mg  2.5 mg Nebulization Q4H PRN [name], MD      . chlorhexidine (PERIDEX) 0.12 % solution 15 mL  15 mL Mouth Rinse BID Erick Colace, NP   15 mL at 10/17/19 0901  . Chlorhexidine Gluconate Cloth 2 % PADS 6 each  6 each Topical Daily Erick Colace, NP   6 each at 10/17/19 0901  . collagenase (SANTYL) ointment   Topical Daily Donne Hazel, MD   Given at 10/17/19 0901  . dextrose 5 % in lactated ringers infusion   Intravenous Continuous [name], MD 50 mL/hr at 10/17/19 0911 New Bag at 10/17/19 0911  . dextrose 50 % solution 12.5 g  12.5 g Intravenous Once PRN [name], MD      . docusate (COLACE) 50 MG/5ML liquid 100 mg  100 mg Per Tube BID PRN Erick Colace, NP      . donepezil (ARICEPT) tablet 10 mg  10 mg Per Tube QHS Erick Colace, NP   10 mg at 10/15/19 2200  . feeding supplement (OSMOLITE 1.5 CAL) liquid 1,000 mL  1,000 mL Per Tube Continuous [name], MD   Stopped at 10/16/19 1950  . feeding supplement (PRO-STAT SUGAR FREE 64) liquid 30 mL  30 mL Per Tube BID [name], MD      . furosemide (LASIX) injection 40 mg  40 mg Intravenous Daily Erick Colace, NP   40 mg at 10/17/19 0911  . Gerhardt's butt cream   Topical BID Erick Colace, NP   Given at 10/17/19 0911  . heparin injection 5,000 Units  5,000 Units Subcutaneous Q8H Erick Colace, NP    5,000 Units at 10/17/19 [id number]  . labetalol (NORMODYNE) injection 5 mg  5 mg Intravenous Q2H PRN Donne Hazel, MD   5 mg at 10/11/19 1943  . MEDLINE mouth rinse  15 mL Mouth Rinse q12n4p Erick Colace, NP   15 mL at 10/16/19 1601  . potassium chloride (KLOR-CON) packet 20 mEq  20 mEq Oral Daily [name], MD   20 mEq at 10/16/19 1014  . sennosides (SENOKOT) 8.8 MG/5ML syrup 5 mL  5 mL Per Tube BID Erick Colace, NP   5 mL at 10/15/19 2158  . sodium chloride flush (NS) 0.9 % injection 10-40 mL  10-40 mL Intracatheter PRN Erick Colace, NP        Musculoskeletal: Strength & Muscle Tone: decreased Gait & Station: unable  to stand Patient leans: N/A  Psychiatric Specialty Exam: Physical Exam  Nursing note and vitals reviewed. Constitutional: She appears well-developed.  HENT:  Head: Normocephalic.  Respiratory: Effort normal.  Psychiatric: Her affect is blunt. Her speech is delayed. She is slowed. Cognition and memory are impaired.    Review of Systems  Constitutional: Positive for appetite change and fatigue.  Neurological: Positive for weakness.  All other systems reviewed and are negative.   Blood pressure (!) 137/39, pulse 75, temperature 97.9 F (36.6 C), temperature source Oral, resp. rate 14, height 5\' 8"  (1.727 m), weight 55 kg, SpO2 100 %.Body mass index is 18.44 kg/m.  General Appearance: Casual  Eye Contact:  Minimal  Speech:  Slow and only says yes  Volume:  Decreased  Mood:  Unable to assess based on her minimal ability to verbally respond  Affect:  Blunt  Thought Process:  NA  Orientation:  NA  Thought Content:  NA  Suicidal Thoughts:  no attempts  Homicidal Thoughts:  no threat or attempts  Memory:  NA  Judgement:  Impaired  Insight:  Negative  Psychomotor Activity:  Decreased  Concentration:  Concentration: Poor and Attention Span: Poor  Recall:  NA  Fund of Knowledge:  NA  Language:  Poor  Akathisia:  No  Handed:  Right  AIMS (if  indicated):     Assets:  Housing Leisure Time Resilience Social Support  ADL's:  Impaired  Cognition:  Impaired,  Moderate  Sleep:       Treatment Plan Summary: Dementia: -continue Aricept 10 mg daily -Patient does not have capacity to make medical decisions.  Her POA, her husband, and adult children desire her to have a feeding tube so she can return home, whether she recovers or dies.  Based on their decision, surgery should proceed.  Disposition: No evidence of imminent risk to self or others at present.   Patient does not meet criteria for psychiatric inpatient admission.  Waylan Boga, NP 10/17/2019 11:35 AM

## 2019-10-17 NOTE — Progress Notes (Signed)
PROGRESS NOTE    Barbara Flowers  [id number] DOB: [birth year] DOA: 09/16/2019 PCP: [name], NP    Brief Narrative:  Patient is here for prolonged period. Previous events and treatments events are based upon previous physicians notes and records as below. 69 year old female with history of breast cancer, dementia, history of CVA with left-sided hemiplegia, from home, wheelchair-bound for last 3 years presented to the emergency room on 09/16/2019 with 1 week of cough, congestion, worsening shortness of breath.  Initially patient was found with extensive left-sided infiltrate and left-sided effusion with hypoxemia corrected with nonrebreather and admitted to ICU.  Events as below: 12/28 thoracentesis 1/2 thoracentesis 1/3 intubated and transferred to ICU 1/8 started vanc for MRSA sputum 1/11 not weaning well 1/13 more awake weaning x 2 hrs->spoke w/ family still want full support 1/14 acceptable rapid shallow. Following commands, extubated.   1/15 looks better ready for floor. PCCM sign off.  1/18 Rapid response overnight, suspected ongoing aspiration, copious thick secretions, poor mentation. PCCM re-engaged , back on vancomycin and Unasyn and treated for 7 days with vanco and unasyn.  1/22 patient is clinically improving, remains with aspiration risk. 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 -Surgery consulted for surgical G-tube placement, patient declined so procedure on hold.  - NG tube feeding started. - patient's family state that she does not understand all consequences of not having the procedure, so husband and two children wanting patient to have PEG tube placement and SNF placement.   Long-term feeding issues/ethics: Patient has fluctuating mental status, patient may have underlying hypoactive delirium.  At times she is more lucid and talks to the nursing staff.   Patient has very poor functional  status and now with persistent dysphagia aspiration.  Maintained on NG tube feeding.  Patient's husband, 2 children all recommending for artificial feeding including PEG tube. I do not anticipate that patient will be eating in the near future given the extent of debility. At the bedside, patient had desired not to have any surgery with the surgical team, she occasionally tells nursing staff that she is tired with all this and does not want to go through it . however since patient's family, spouse who is healthcare power of attorney has expressed that she is cognitively impaired and she does not understand all benefits and side effects, long-term effects of it so husband should be able to make decisions. 1/25 night , Ng tube pulled out. Patient stated that she pulled it out as she does not like it.  Psychiatry consult for decision-making capacity.    Antibiotics: Rocephin 12/26 >> (14 day course) 1/8 Azithromycin 12/27 > 28 Vancomycin 12/26, 1/7-1/12, 1/18 > 1/24 Unasyn 1/18 > 1/24  Diagnostic data: Covid 19 PCR 12/26 >> neg RVP 12/26 >>neg  Procalcitonin 12/26 >> 2.01, down trended to 0.4 with abx BCx2 12/26 >>Pan sensitive Proteus Mirabilis UC 12/26 >> negative Urine Pneum Ag 12/26 >> positive Respiratory culture 1/6>> MRSA  Assessment & Plan:   Active Problems:   Dementia ([place])   CAP (community acquired pneumonia)   Pleural effusion on left   Acute prerenal azotemia   Hypernatremia   Pressure injury of skin   Malnutrition of moderate degree   Acute hypoxemic respiratory failure (HCC)   Pleural effusion   Acute systolic heart failure (HCC)   Failure to thrive (0-17)   Palliative care by specialist   Goals of care, counseling/discussion   Generalized weakness  Acute  hypoxemic and hypercarbic respiratory failure in the setting of MRSA pneumonia, poor cough clearance and extreme debility: Resolved.  On room air now.  Acute metabolic encephalopathy with history of  underlying dementia: CT head negative 1/16.  Mentation fluctuates. With history of previous strokes, contractures and debility.  We will recheck CT head today.  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 fib with RVR: EF 25 to 30%.  Intermittent A. fib.  Currently in sinus rhythm.  Hypokalemia: Replaced.  On a schedule replacement with Lasix. Continue close monitoring.  Adequately replaced.  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  Seen by surgery.  Bedside debridement.  PT doing hydrotherapy.  Nutrition: Patient has been n.p.o.  Multiple attempts of NG tube placement failed 1/19. Evaluated by speech therapy, unable to safely swallow. IR unable to do PEG tube due to Roux-en-Y. Patient declined to have procedure with surgery.  Surgery team decided not to do G-tube feeding as patient declined the procedure. Likely she does not understand the long-term effects. We will consult psychiatry to evaluate for decision-making capacity. Fluid: Intermittent dextrose.  Now tolerating tube feeding. Goal of care: Family desired full code including tracheostomy if needed, PEG tube placement.    DVT prophylaxis: Heparin subcu Code Status: Full code Family Communication: Patient's husband.  1/25.  Will meet him. Disposition Plan: from home . No feeding means now. Probably placement to skilled nursing facility after establishing artificial feeding.   Consultants:   PCCM,  Surgery,  Palliative medicine  Procedures:   Bedside wound debridement, 1/19  Antimicrobials:   Currently on Unasyn and vancomycin.  Finished treatment.   Subjective:  Seen and examined.  Remains the same.  No overnight events.  Telemetry with sinus rhythm.  Objective: Vitals:   10/17/19 0400 10/17/19 0445 10/17/19 0500 10/17/19 0600  BP: (!) 181/77 (!) 174/68 (!) 170/63 (!) 132/50  Pulse: 80 79 74 76  Resp: 18 17 17 18   Temp:      TempSrc:       SpO2: 92% 100% 100% 100%  Weight:   55 kg   Height:        Intake/Output Summary (Last 24 hours) at 10/17/2019 0812 Last data filed at 10/17/2019 0600 Gross per 24 hour  Intake 980.11 ml  Output 2600 ml  Net -1619.89 ml   Filed Weights   10/15/19 0500 10/16/19 0500 10/17/19 0500  Weight: 59.3 kg 56 kg 55 kg    Examination:  Physical Exam  Constitutional:  Chronically sick looking, debilitated.  Not in any acute distress.  Flat affect.   HENT:  Head: Normocephalic.  Eyes: Pupils are equal, round, and reactive to light.  Cardiovascular: Regular rhythm.  Respiratory: Breath sounds normal.  On room air.  GI: Bowel sounds are normal.  NG tube infusing.  Musculoskeletal:     Cervical back: Neck supple.     Comments: Bilateral lower leg contractures, not moving. Right upper extremity normal power. Left upper extremity contracture with minimal movement.  Neurological: She is alert.  Flat affect.  Oriented x1 or 2.  She has fluctuating mentation.    Skin:  Stage IV decubital ulcer left sacrum with dressing.      Data Reviewed: I have personally reviewed following labs and imaging studies  CBC: Recent Labs  Lab 10/13/19 0149 10/14/19 0706 10/15/19 0220 10/16/19 0228 10/17/19 0248  WBC 11.0* 10.7* 14.4* 14.2* 14.8*  NEUTROABS 7.1 6.8 10.3* 10.3* 10.9*  HGB 8.6*  9.1* 8.8* 8.7* 9.8*  HCT 28.9* 30.4* 29.5* 29.4* 32.3*  MCV 97.6 96.8 96.1 97.0 97.3  PLT 295 278 311 269 [id number]   Basic Metabolic Panel: Recent Labs  Lab 10/12/19 0430 10/12/19 0430 10/13/19 0149 10/14/19 0706 10/15/19 0220 10/16/19 0228 10/17/19 0248  NA 144   < > 142 140 139 138 139  K 4.3   < > 4.1 4.2 4.7 4.7 4.9  CL 107   < > 105 101 102 100 97*  CO2 27   < > 28 31 28 28  32  GLUCOSE 101*   < > 157* 127* 130* 146* 101*  BUN 30*   < > 22 22 29* 34* 37*  CREATININE 0.51   < > 0.49 0.50 0.54 0.62 0.59  CALCIUM 8.2*   < > 8.0* 8.1* 8.2* 8.3* 8.8*  MG 1.9  --  2.3 2.0 2.1  --   --   PHOS 2.3*   --  4.1 3.0 3.5  --   --    < > = values in this interval not displayed.   GFR: Estimated Creatinine Clearance: 57.6 mL/min (by C-G formula based on SCr of 0.59 mg/dL). Liver Function Tests: Recent Labs  Lab 10/13/19 0149 10/14/19 0706 10/15/19 0220 10/16/19 0228 10/17/19 0248  AST 24 16 18 24  32  ALT 28 22 21 21 27   ALKPHOS 76 82 77 72 80  BILITOT 0.6 0.5 0.6 0.6 0.8  PROT 5.8* 5.9* 5.9* 5.9* 6.8  ALBUMIN 2.2* 2.4* 2.4* 2.3* 2.7*   No results for input(s): LIPASE, AMYLASE in the last 168 hours. No results for input(s): AMMONIA in the last 168 hours. Coagulation Profile: No results for input(s): INR, PROTIME in the last 168 hours. Cardiac Enzymes: No results for input(s): CKTOTAL, CKMB, CKMBINDEX, TROPONINI in the last 168 hours. BNP (last 3 results) No results for input(s): PROBNP in the last 8760 hours. HbA1C: No results for input(s): HGBA1C in the last 72 hours. CBG: Recent Labs  Lab 10/16/19 1139 10/16/19 1533 10/16/19 1955 10/16/19 2330 10/17/19 0326  GLUCAP 113* 121* 122* 102* 90   Lipid Profile: No results for input(s): CHOL, HDL, LDLCALC, TRIG, CHOLHDL, LDLDIRECT in the last 72 hours. Thyroid Function Tests: No results for input(s): TSH, T4TOTAL, FREET4, T3FREE, THYROIDAB in the last 72 hours. Anemia Panel: No results for input(s): VITAMINB12, FOLATE, FERRITIN, TIBC, IRON, RETICCTPCT in the last 72 hours. Sepsis Labs: No results for input(s): PROCALCITON, LATICACIDVEN in the last 168 hours.  Recent Results (from the past 240 hour(s))  MRSA PCR Screening     Status: Abnormal   Collection Time: 10/09/19  9:21 AM   Specimen: Nasal Mucosa; Nasopharyngeal  Result Value Ref Range Status   MRSA by PCR POSITIVE (A) NEGATIVE Final    Comment:        The GeneXpert MRSA Assay (FDA approved for NASAL specimens only), is one component of a comprehensive MRSA colonization surveillance program. It is not intended to diagnose MRSA infection nor to guide  or monitor treatment for MRSA infections. RESULT CALLED TO, READ BACK BY AND VERIFIED WITH: ROMINES,H. RN @1531  10/09/19 BILLINGSLEY,L Performed at [facility], [place] [address], [place], [place] [address]          Radiology Studies: No results found.      Scheduled Meds: . chlorhexidine  15 mL Mouth Rinse BID  . Chlorhexidine Gluconate Cloth  6 each Topical Daily  . collagenase   Topical Daily  . donepezil  10  mg Per Tube QHS  . feeding supplement (PRO-STAT SUGAR FREE 64)  30 mL Per Tube BID  . furosemide  40 mg Intravenous Daily  . Gerhardt's butt cream   Topical BID  . heparin  5,000 Units Subcutaneous Q8H  . mouth rinse  15 mL Mouth Rinse q12n4p  . potassium chloride  20 mEq Oral Daily  . sennosides  5 mL Per Tube BID   Continuous Infusions: . sodium chloride Stopped (10/16/19 1900)  . dextrose 5% lactated ringers    . feeding supplement (OSMOLITE 1.5 CAL) Stopped (10/16/19 1950)     LOS: 31 days    Time spent: 30 minutes     [name], MD [facility] Pager [phone]

## 2019-10-17 NOTE — H&P (View-Only) (Signed)
Barbara Guardado MD:[id number] 19-Jul-1951  CARE TEAM:  PCP: [name], NP  Outpatient Care Team: Patient Care Team: [name], NP as PCP - General (Nurse Practitioner) [name], MD as Referring Physician (Gastroenterology) [name], MD as Referring Physician (Internal Medicine) [name], MD as Referring Physician (Neurology) [name], [name]., MD as Referring Physician (Cardiology)  Inpatient Treatment Team: Treatment Team: Attending Provider: [name], MD; Occupational Therapist: [name], OT; [place] Nurse: [name], RN; Rounding Team: [name], MD; Registered Nurse: [name], RN; Consulting Physician: [name], Md, MD; Case Manager: Frann Rider, RN; Utilization Review: Orlean Bradford, RN   Problem List:   Active Problems:   Failure to thrive in adult   History of CVA with residual deficit left side   Wheelchair dependent 2017-   Dementia (Minor)   CAP (community acquired pneumonia)   Pleural effusion on left   Acute prerenal azotemia   Hypernatremia   Pressure injury of skin   Malnutrition of moderate degree   Acute hypoxemic respiratory failure ([place])   Pleural effusion   Acute systolic heart failure ([place])   Palliative care by specialist   Goals of care, counseling/discussion   Generalized weakness   Parkinson's disease ([place])   Sacral decubitus ulcer, stage IV ([place])   H/O bone marrow transplant ([place])   History of Roux-en-Y gastric bypass   Dysphagia   Kyphoscoliosis      * No surgery found *      Assessment  Severe dysphagia in the setting of multiple medical problems and prolonged hospitalization from pneumonia most likely related to aspiration event.  [facility] Stay = 31 days)  Plan:  Patient's husband and children desire feeding gastrostomy tube in the hope of giving her the capacity to have full nutrition in her medications in the hopes that she can have some recovery and quality of life.   I had a long conversation with the patient and especially her husband who was in the room.  Patient formally evaluated by psychiatry and they feel that she does not have mental capacity.  Primary service agrees.  Patient sitting up in chair.  She is obviously tired.  Lightly whispering.  She will nod yes to simple questions that her husband asks.  She nodded yes to wanting to go home.  She nodded yes to wanting to live.  She nodded yes to the idea of having a feeding tube to allow her to do that.  She has never had a prior feeding tube.  I reviewed her chart extensively and her significant past medical history.  History and physical exam as well.  She has been through quite a lot with her breast cancer requiring multidisciplinary treatment including bone marrow transplant, TIA/strokes, recurrent brain tumors with stereotactic treatment and then excision, limited mobility and now wheelchair-bound for the past 4 years.  The patient is wanted to fight for a long time, and it is obvious that the family has worked diligently to help make that happen.  While the patient has intermittently expressed not wanting to continue the fight at times this admission, she has had issues with delirium and confusion at times.  Apparently at other times she is wished to try and recover.  I stressed the importance of making sure that we are doing what the patient would want and is in her best interest.  Patient's husband feels like she has had some quality of life at home and wishes to give  her a chance to get back to that.  I noted that I am concerned that her prognosis is poor with her large sacral decubitus and persistent dysphagia as poor prognostic indicators and failure to thrive.  He was hoping to have feeding tube placement to allow her to get her nutrition and give her a chance to go home.  She refuses a nasal feeding tube again.  The patient's husband understands that prognosis is poor and this is her last chance in any  recovery or stabilization.  He does note that it takes longer for her to have any recovery with each admission or setback.  He again discussed with Dr Domingo Cocking with palliative care this afternoon.  I discussed with Dr. Domingo Cocking with palliative care as well. It is not unreasonable to set a goal of two weeks to see if things can improve at home.  If not, revisit the idea of hospice and supportive care to not prolong her suffering.    I will work to place a feeding tube in the morning.  Start laparoscopically to avoid a larger incision with risks of dehiscence given her severely malnourished state.  She has had a prior Roux-en-Y gastric bypass decades ago making endoscopic or radiographic approaches not feasible.  Her stomach remnant is shrunken, but I am guardedly optimistic that I can mobilize it and bring it up to the left upper quadrant.  Otherwise, may need a feeding jejunostomy tube which will be challenged by not able to put his large of a caliber tube with increased risks of dislodgment and occlusion.  I cautioned that there are risks of leak given her severely malnourished and deconditioned state.  She could pull out the gastrostomy tube as she is pulled out nasogastric tubes.  Risk of leak and peritonitis elevated until the tract is well incorporated by a month.  Hopefully the risk of be somewhat less since it is a blind gastric remnant pouch.  There is increased risk that she would need to be reintubated.  Risks of stroke, heart attack, bleeding, death, need for gastric tube replacement, reoperation, and other risks discussed.  Patient's husband understands these risks.  He wants to give a chance for her to have a feeding tube & transition to home.  He wishes her to be full code for now.  I encouraged him to discuss about limited code vs DNR now or in the near future.  I updated [facility] ICU nursing leadership as well.     -VTE prophylaxis- SCDs, etc -mobilize as tolerated to help recovery  90 minutes  spent in review, evaluation, examination, counseling, and coordination of care.  More than 50% of that time was spent in counseling.  10/17/2019    Subjective: (Chief complaint)  Patient sitting up in chair.  Leaning somewhat.  Husband in room playing solitaire with her [place] just outside room.  Objective:  Vital signs:  Vitals:   10/17/19 1200 10/17/19 1300 10/17/19 1400 10/17/19 1527  BP:    (!) 128/49  Pulse: 73 79 72 75  Resp: 15 15 20  (!) 22  Temp: 98.2 F (36.8 C)     TempSrc: Oral     SpO2: 100% 94% 94% 95%  Weight:      Height:        Last BM Date: 10/14/19  Intake/Output   Yesterday:  01/25 0701 - 01/26 0700 In: 1260.1 [I.V.:206.6; NG/GT:1053.5] Out: 2600 [Urine:2600] This shift:  Total I/O In: 239.4 [I.V.:239.4] Out: -  Physical Exam:  General: Patient awake.  Tired but not toxic.  Very cachectic Eyes: PERRL, normal EOM.  Sclera clear.  No icterus Lymph: No head/neck/groin lymphadenopathy Psych: Oriented to self mainly.  Recognizes her husband.  Not agitated. HENT: Normocephalic, Mucus membranes dry.  Moderate hair loss .  No thrush Neck: Supple, No tracheal deviation.  No obvious thyromegaly Chest: No audible wheezing no chest wall pain w good excursion CV:  Pulses intact.  Regular rhythm MS: Normal AROM mjr joints.  No obvious deformity  Abdomen: Soft.  Nondistended.  Nontender.  No evidence of peritonitis.  No incarcerated hernias.  Skin: No petechiae / purpura.  Hands warm and dry  Results:   Cultures: Recent Results (from the past 720 hour(s))  Body fluid culture (includes gram stain)     Status: None   Collection Time: 09/18/19 11:01 AM   Specimen: Pleural Fluid  Result Value Ref Range Status   Specimen Description   Final    PLEURAL Performed at [place] [address], [place], [place] [address]    Special Requests   Final    NONE Performed at [facility], [place]  [address], [place], [place] [address]    Gram Stain   Final    ABUNDANT WBC PRESENT, PREDOMINANTLY PMN NO ORGANISMS SEEN    Culture   Final    NO GROWTH 3 DAYS Performed at [facility], [place] [address] [place], [place] [address]    Report Status 09/22/2019 FINAL  Final  Culture, body fluid-bottle     Status: None   Collection Time: 09/23/19 12:30 PM   Specimen: Pleura  Result Value Ref Range Status   Specimen Description PLEURAL  Final   Special Requests NONE  Final   Culture   Final    NO GROWTH 5 DAYS Performed at [place] [address], [place], [place] [address]    Report Status 09/28/2019 FINAL  Final  Gram stain     Status: None   Collection Time: 09/23/19 12:30 PM   Specimen: Pleura  Result Value Ref Range Status   Specimen Description PLEURAL  Final   Special Requests NONE  Final   Gram Stain   Final    NO WBC SEEN NO ORGANISMS SEEN Performed at [facility], [place] [address], [place], [place] [address]    Report Status 09/24/2019 FINAL  Final  Culture, respiratory (non-expectorated)     Status: None   Collection Time: 09/27/19  8:01 AM   Specimen: Tracheal Aspirate; Respiratory  Result Value Ref Range Status   Specimen Description   Final    TRACHEAL ASPIRATE Performed at [place] [address] [place], [place] [address]    Special Requests   Final    NONE Performed at [facility], [place] [address], [place], [place] [address]    Gram Stain   Final    RARE WBC PRESENT, PREDOMINANTLY PMN RARE GRAM POSITIVE COCCI IN PAIRS Performed at [facility], [place] [address] [place], [place] [address]    Culture FEW METHICILLIN RESISTANT STAPHYLOCOCCUS AUREUS  Final   Report Status 09/29/2019 FINAL  Final   Organism ID, Bacteria METHICILLIN RESISTANT STAPHYLOCOCCUS AUREUS  Final      Susceptibility   Methicillin resistant staphylococcus aureus - MIC*    CIPROFLOXACIN >=8 RESISTANT Resistant      ERYTHROMYCIN >=8 RESISTANT Resistant     GENTAMICIN <=0.5 SENSITIVE  Sensitive     OXACILLIN >=4 RESISTANT Resistant     TETRACYCLINE 2 SENSITIVE Sensitive     VANCOMYCIN 1 SENSITIVE Sensitive     TRIMETH/SULFA <=10 SENSITIVE Sensitive     CLINDAMYCIN >=8 RESISTANT Resistant     RIFAMPIN <=0.5 SENSITIVE Sensitive     Inducible Clindamycin NEGATIVE Sensitive     * FEW METHICILLIN RESISTANT STAPHYLOCOCCUS AUREUS  MRSA PCR Screening     Status: Abnormal   Collection Time: 10/09/19  9:21 AM   Specimen: Nasal Mucosa; Nasopharyngeal  Result Value Ref Range Status   MRSA by PCR POSITIVE (A) NEGATIVE Final    Comment:        The GeneXpert MRSA Assay (FDA approved for NASAL specimens only), is one component of a comprehensive MRSA colonization surveillance program. It is not intended to diagnose MRSA infection nor to guide or monitor treatment for MRSA infections. RESULT CALLED TO, READ BACK BY AND VERIFIED WITH: ROMINES,H. RN @1531  10/09/19 BILLINGSLEY,L Performed at [facility], [place] [address] [place], [place] [address]     Labs: Results for orders placed or performed during the hospital encounter of 09/16/19 (from the past 48 hour(s))  Glucose, capillary     Status: Abnormal   Collection Time: 10/15/19  7:34 PM  Result Value Ref Range   Glucose-Capillary 117 (H) 70 - 99 mg/dL  Glucose, capillary     Status: Abnormal   Collection Time: 10/15/19 11:27 PM  Result Value Ref Range   Glucose-Capillary 117 (H) 70 - 99 mg/dL  CBC with Differential/Platelet     Status: Abnormal   Collection Time: 10/16/19  2:28 AM  Result Value Ref Range   WBC 14.2 (H) 4.0 - 10.5 K/uL   RBC 3.03 (L) 3.87 - 5.11 MIL/uL   Hemoglobin 8.7 (L) 12.0 - 15.0 g/dL   HCT 29.4 (L) 36.0 - 46.0 %   MCV 97.0 80.0 - 100.0 fL   MCH 28.7 26.0 - 34.0 pg   MCHC 29.6 (L) 30.0 - 36.0 g/dL   RDW 14.8 11.5 - 15.5 %   Platelets 269 150 - 400 K/uL   nRBC 0.0 0.0 - 0.2 %   Neutrophils Relative % 73 %     Neutro Abs 10.3 (H) 1.7 - 7.7 K/uL   Lymphocytes Relative 17 %   Lymphs Abs 2.4 0.7 - 4.0 K/uL   Monocytes Relative 7 %   Monocytes Absolute 1.0 0.1 - 1.0 K/uL   Eosinophils Relative 1 %   Eosinophils Absolute 0.2 0.0 - 0.5 K/uL   Basophils Relative 1 %   Basophils Absolute 0.1 0.0 - 0.1 K/uL   Immature Granulocytes 1 %   Abs Immature Granulocytes 0.17 (H) 0.00 - 0.07 K/uL    Comment: Performed at [facility], [place] [address], [place], [place] [address]  Comprehensive metabolic panel     Status: Abnormal   Collection Time: 10/16/19  2:28 AM  Result Value Ref Range   Sodium 138 135 - 145 mmol/L   Potassium 4.7 3.5 - 5.1 mmol/L   Chloride 100 98 - 111 mmol/L   CO2 28 22 - 32 mmol/L   Glucose, Bld 146 (H) 70 - 99 mg/dL   BUN 34 (H) 8 - 23 mg/dL   Creatinine, Ser 0.62 0.44 - 1.00 mg/dL   Calcium 8.3 (L) 8.9 - 10.3 mg/dL   Total Protein 5.9 (L) 6.5 - 8.1 g/dL   Albumin 2.3 (L) 3.5 - 5.0 g/dL   AST 24  15 - 41 U/L   ALT 21 0 - 44 U/L   Alkaline Phosphatase 72 38 - 126 U/L   Total Bilirubin 0.6 0.3 - 1.2 mg/dL   GFR calc non Af Amer >60 >60 mL/min   GFR calc Af Amer >60 >60 mL/min   Anion gap 10 5 - 15    Comment: Performed at [facility], [place] [address] [place], [place] [address]  Glucose, capillary     Status: Abnormal   Collection Time: 10/16/19  3:23 AM  Result Value Ref Range   Glucose-Capillary 143 (H) 70 - 99 mg/dL  Glucose, capillary     Status: Abnormal   Collection Time: 10/16/19  7:52 AM  Result Value Ref Range   Glucose-Capillary 118 (H) 70 - 99 mg/dL   Comment 1 Notify RN    Comment 2 Document in Chart   Glucose, capillary     Status: Abnormal   Collection Time: 10/16/19 11:39 AM  Result Value Ref Range   Glucose-Capillary 113 (H) 70 - 99 mg/dL   Comment 1 Notify RN    Comment 2 Document in Chart   Glucose, capillary     Status: Abnormal   Collection Time: 10/16/19  3:33 PM  Result Value Ref Range   Glucose-Capillary  121 (H) 70 - 99 mg/dL   Comment 1 Notify RN    Comment 2 Document in Chart   Glucose, capillary     Status: Abnormal   Collection Time: 10/16/19  7:55 PM  Result Value Ref Range   Glucose-Capillary 122 (H) 70 - 99 mg/dL  Glucose, capillary     Status: Abnormal   Collection Time: 10/16/19 11:30 PM  Result Value Ref Range   Glucose-Capillary 102 (H) 70 - 99 mg/dL  CBC with Differential/Platelet     Status: Abnormal   Collection Time: 10/17/19  2:48 AM  Result Value Ref Range   WBC 14.8 (H) 4.0 - 10.5 K/uL   RBC 3.32 (L) 3.87 - 5.11 MIL/uL   Hemoglobin 9.8 (L) 12.0 - 15.0 g/dL   HCT 32.3 (L) 36.0 - 46.0 %   MCV 97.3 80.0 - 100.0 fL   MCH 29.5 26.0 - 34.0 pg   MCHC 30.3 30.0 - 36.0 g/dL   RDW 15.2 11.5 - 15.5 %   Platelets 282 150 - 400 K/uL   nRBC 0.0 0.0 - 0.2 %   Neutrophils Relative % 73 %   Neutro Abs 10.9 (H) 1.7 - 7.7 K/uL   Lymphocytes Relative 17 %   Lymphs Abs 2.4 0.7 - 4.0 K/uL   Monocytes Relative 7 %   Monocytes Absolute 1.0 0.1 - 1.0 K/uL   Eosinophils Relative 1 %   Eosinophils Absolute 0.2 0.0 - 0.5 K/uL   Basophils Relative 1 %   Basophils Absolute 0.1 0.0 - 0.1 K/uL   Immature Granulocytes 1 %   Abs Immature Granulocytes 0.13 (H) 0.00 - 0.07 K/uL    Comment: Performed at [facility], [place] [address], [place], [place] [address]  Comprehensive metabolic panel     Status: Abnormal   Collection Time: 10/17/19  2:48 AM  Result Value Ref Range   Sodium 139 135 - 145 mmol/L   Potassium 4.9 3.5 - 5.1 mmol/L   Chloride 97 (L) 98 - 111 mmol/L   CO2 32 22 - 32 mmol/L   Glucose, Bld 101 (H) 70 - 99 mg/dL   BUN 37 (H) 8 - 23 mg/dL   Creatinine,  Ser 0.59 0.44 - 1.00 mg/dL   Calcium 8.8 (L) 8.9 - 10.3 mg/dL   Total Protein 6.8 6.5 - 8.1 g/dL   Albumin 2.7 (L) 3.5 - 5.0 g/dL   AST 32 15 - 41 U/L   ALT 27 0 - 44 U/L   Alkaline Phosphatase 80 38 - 126 U/L   Total Bilirubin 0.8 0.3 - 1.2 mg/dL   GFR calc non Af Amer >60 >60 mL/min   GFR calc Af  Amer >60 >60 mL/min   Anion gap 10 5 - 15    Comment: Performed at [facility], [place] [address] [place], [place] [address]  Glucose, capillary     Status: None   Collection Time: 10/17/19  3:26 AM  Result Value Ref Range   Glucose-Capillary 90 70 - 99 mg/dL  Glucose, capillary     Status: None   Collection Time: 10/17/19  6:57 AM  Result Value Ref Range   Glucose-Capillary 84 70 - 99 mg/dL  Glucose, capillary     Status: None   Collection Time: 10/17/19  7:51 AM  Result Value Ref Range   Glucose-Capillary 81 70 - 99 mg/dL   Comment 1 Notify RN    Comment 2 Document in Chart   Glucose, capillary     Status: None   Collection Time: 10/17/19 12:24 PM  Result Value Ref Range   Glucose-Capillary 98 70 - 99 mg/dL   Comment 1 Notify RN    Comment 2 Document in Chart     Imaging / Studies: CT HEAD WO CONTRAST  Result Date: 10/17/2019 CLINICAL DATA:  Altered mental status.  Dysphagia EXAM: CT HEAD WITHOUT CONTRAST TECHNIQUE: Contiguous axial images were obtained from the base of the skull through the vertex without intravenous contrast. COMPARISON:  CT head 10/07/2019 FINDINGS: Brain: Moderate to advanced atrophy. Chronic microvascular ischemic changes in the white matter. Chronic ischemic change in the pons. Chronic infarct left thalamus. Chronic ischemic changes in the cerebellum bilaterally. Right parietal craniotomy. Parenchymal calcification at the craniotomy site is chronic and unchanged. Negative for acute infarct, hemorrhage, mass Vascular: Normal arterial flow voids. Skull: Right parietal craniotomy. Sinuses/Orbits: Mild mucosal edema paranasal sinuses. Bilateral mastoid effusion. Bilateral cataract extraction. Other: None IMPRESSION: Atrophy and extensive chronic ischemia. No acute abnormality and no change from the prior CT. Electronically Signed   By: Franchot Gallo M.D.   On: 10/17/2019 12:44    Medications / Allergies: per chart  Antibiotics: Anti-infectives  (From admission, onward)   Start     Dose/Rate Route Frequency Ordered Stop   10/18/19 0600  cefoTEtan (CEFOTAN) 2 g in sodium chloride 0.9 % 100 mL IVPB     2 g 200 mL/hr over 30 Minutes Intravenous On call to O.R. 10/17/19 1640 10/19/19 0559   10/10/19 1100  vancomycin (VANCOCIN) IVPB 1000 mg/200 mL premix     1,000 mg 200 mL/hr over 60 Minutes Intravenous Every 24 hours 10/09/19 0922 10/15/19 1220   10/09/19 1030  vancomycin (VANCOREADY) IVPB 1250 mg/250 mL     1,250 mg 166.7 mL/hr over 90 Minutes Intravenous  Once 10/09/19 0911 10/09/19 1458   10/09/19 1000  Ampicillin-Sulbactam (UNASYN) 3 g in sodium chloride 0.9 % 100 mL IVPB     3 g 200 mL/hr over 30 Minutes Intravenous Every 6 hours 10/09/19 0901 10/15/19 2232   10/02/19 2100  vancomycin (VANCOCIN) IVPB 1000 mg/200 mL premix  Status:  Discontinued     1,000 mg 200 mL/hr over 60  Minutes Intravenous Every 24 hours 10/02/19 1404 10/05/19 0849   09/29/19 1400  vancomycin (VANCOREADY) IVPB 1500 mg/300 mL  Status:  Discontinued     1,500 mg 150 mL/hr over 120 Minutes Intravenous Every 24 hours 09/28/19 1228 10/02/19 1404   09/28/19 1300  vancomycin (VANCOREADY) IVPB 1750 mg/350 mL     1,750 mg 175 mL/hr over 120 Minutes Intravenous  Once 09/28/19 1228 09/28/19 1451   09/17/19 1000  azithromycin (ZITHROMAX) 500 mg in sodium chloride 0.9 % 250 mL IVPB  Status:  Discontinued     500 mg 250 mL/hr over 60 Minutes Intravenous Every 24 hours 09/17/19 0920 09/18/19 1157   09/16/19 2200  cefTRIAXone (ROCEPHIN) 2 g in sodium chloride 0.9 % 100 mL IVPB     2 g 200 mL/hr over 30 Minutes Intravenous Every 24 hours 09/16/19 1745 09/29/19 2216   09/16/19 1745  cefTRIAXone (ROCEPHIN) injection 1 g  Status:  Discontinued     1 g Intramuscular Every 24 hours 09/16/19 1740 09/16/19 1743   09/16/19 1515  ceFEPIme (MAXIPIME) 2 g in sodium chloride 0.9 % 100 mL IVPB     2 g 200 mL/hr over 30 Minutes Intravenous  Once 09/16/19 1513 09/16/19 1834    09/16/19 1515  metroNIDAZOLE (FLAGYL) IVPB 500 mg  Status:  Discontinued     500 mg 100 mL/hr over 60 Minutes Intravenous  Once 09/16/19 1513 09/16/19 1741   09/16/19 1515  vancomycin (VANCOCIN) IVPB 1000 mg/200 mL premix  Status:  Discontinued     1,000 mg 200 mL/hr over 60 Minutes Intravenous  Once 09/16/19 1513 09/16/19 1834        Note: Portions of this report may have been transcribed using voice recognition software. Every effort was made to ensure accuracy; however, inadvertent computerized transcription errors may be present.   Any transcriptional errors that result from this process are unintentional.     Adin Hector, MD, FACS, MASCRS Gastrointestinal and Minimally Invasive Surgery    [address] [place], [place] [address] [phone] Main / Paging [phone] Fax Please see [place] for pager number, especial 5pm - 7am.

## 2019-10-18 ENCOUNTER — Inpatient Hospital Stay (HOSPITAL_COMMUNITY): Payer: Medicare Other | Admitting: Anesthesiology

## 2019-10-18 ENCOUNTER — Encounter (HOSPITAL_COMMUNITY): Payer: Self-pay | Admitting: Internal Medicine

## 2019-10-18 ENCOUNTER — Encounter (HOSPITAL_COMMUNITY)
Admission: EM | Disposition: A | Discharge status: Home Health | Payer: Self-pay | Source: Home / Self Care | Attending: Internal Medicine

## 2019-10-18 HISTORY — PX: LAPAROSCOPIC LYSIS OF ADHESIONS: SHX5905

## 2019-10-18 HISTORY — PX: LAPAROSCOPIC GASTROSTOMY: SHX5896

## 2019-10-18 HISTORY — PX: INCISIONAL HERNIA REPAIR: SHX193

## 2019-10-18 LAB — CBC WITH DIFFERENTIAL/PLATELET
Abs Immature Granulocytes: 0.1 10*3/uL — ABNORMAL HIGH (ref 0.00–0.07)
Basophils Absolute: 0.1 10*3/uL (ref 0.0–0.1)
Basophils Relative: 1 %
Eosinophils Absolute: 0.2 10*3/uL (ref 0.0–0.5)
Eosinophils Relative: 2 %
HCT: 30.4 % — ABNORMAL LOW (ref 36.0–46.0)
Hemoglobin: 9 g/dL — ABNORMAL LOW (ref 12.0–15.0)
Immature Granulocytes: 1 %
Lymphocytes Relative: 22 %
Lymphs Abs: 2.3 10*3/uL (ref 0.7–4.0)
MCH: 28.8 pg (ref 26.0–34.0)
MCHC: 29.6 g/dL — ABNORMAL LOW (ref 30.0–36.0)
MCV: 97.1 fL (ref 80.0–100.0)
Monocytes Absolute: 0.9 10*3/uL (ref 0.1–1.0)
Monocytes Relative: 9 %
Neutro Abs: 7 10*3/uL (ref 1.7–7.7)
Neutrophils Relative %: 65 %
Platelets: 265 10*3/uL (ref 150–400)
RBC: 3.13 MIL/uL — ABNORMAL LOW (ref 3.87–5.11)
RDW: 15 % (ref 11.5–15.5)
WBC: 10.5 10*3/uL (ref 4.0–10.5)
nRBC: 0 % (ref 0.0–0.2)

## 2019-10-18 LAB — PREALBUMIN: Prealbumin: 8.8 mg/dL — ABNORMAL LOW (ref 18–38)

## 2019-10-18 LAB — GLUCOSE, CAPILLARY
Glucose-Capillary: 90 mg/dL (ref 70–99)
Glucose-Capillary: 93 mg/dL (ref 70–99)
Glucose-Capillary: 76 mg/dL (ref 70–99)
Glucose-Capillary: 81 mg/dL (ref 70–99)
Glucose-Capillary: 130 mg/dL — ABNORMAL HIGH (ref 70–99)

## 2019-10-18 SURGERY — CREATION, GASTROSTOMY, LAPAROSCOPIC
Anesthesia: General | Site: Abdomen

## 2019-10-18 MED ORDER — VITAL HIGH PROTEIN PO LIQD
1000.0000 mL | ORAL | Status: AC
  Administered 2019-10-19: 1000 mL

## 2019-10-18 MED ORDER — PHENYLEPHRINE HCL-NACL 10-0.9 MG/250ML-% IV SOLN
INTRAVENOUS | Status: DC | PRN
  Administered 2019-10-18: 25 ug/min via INTRAVENOUS

## 2019-10-18 MED ORDER — LACTATED RINGERS IR SOLN
Status: DC | PRN
  Administered 2019-10-18: 1000 mL

## 2019-10-18 MED ORDER — PHENYLEPHRINE 40 MCG/ML (10ML) SYRINGE FOR IV PUSH (FOR BLOOD PRESSURE SUPPORT)
PREFILLED_SYRINGE | INTRAVENOUS | Status: DC | PRN
  Administered 2019-10-18 (×2): 80 ug via INTRAVENOUS

## 2019-10-18 MED ORDER — SUCCINYLCHOLINE CHLORIDE 200 MG/10ML IV SOSY
PREFILLED_SYRINGE | INTRAVENOUS | Status: AC
  Filled 2019-10-18: qty 10

## 2019-10-18 MED ORDER — ROCURONIUM BROMIDE 10 MG/ML (PF) SYRINGE
PREFILLED_SYRINGE | INTRAVENOUS | Status: DC | PRN
  Administered 2019-10-18: 40 mg via INTRAVENOUS
  Administered 2019-10-18 (×3): 10 mg via INTRAVENOUS

## 2019-10-18 MED ORDER — PROPOFOL 10 MG/ML IV BOLUS
INTRAVENOUS | Status: AC
  Filled 2019-10-18: qty 40

## 2019-10-18 MED ORDER — BUPIVACAINE HCL (PF) 0.25 % IJ SOLN
INTRAMUSCULAR | Status: AC
  Filled 2019-10-18: qty 30

## 2019-10-18 MED ORDER — 0.9 % SODIUM CHLORIDE (POUR BTL) OPTIME
TOPICAL | Status: DC | PRN
  Administered 2019-10-18: 1000 mL

## 2019-10-18 MED ORDER — GLYCOPYRROLATE PF 0.2 MG/ML IJ SOSY
PREFILLED_SYRINGE | INTRAMUSCULAR | Status: AC
  Filled 2019-10-18: qty 1

## 2019-10-18 MED ORDER — FENTANYL CITRATE (PF) 100 MCG/2ML IJ SOLN
INTRAMUSCULAR | Status: AC
  Filled 2019-10-18: qty 2

## 2019-10-18 MED ORDER — FENTANYL CITRATE (PF) 100 MCG/2ML IJ SOLN: 25.0000 ug | INTRAMUSCULAR | Status: DC | PRN

## 2019-10-18 MED ORDER — SUGAMMADEX SODIUM 200 MG/2ML IV SOLN
INTRAVENOUS | Status: DC | PRN
  Administered 2019-10-18: 150 mg via INTRAVENOUS

## 2019-10-18 MED ORDER — FENTANYL CITRATE (PF) 100 MCG/2ML IJ SOLN
INTRAMUSCULAR | Status: DC | PRN
  Administered 2019-10-18 (×2): 25 ug via INTRAVENOUS

## 2019-10-18 MED ORDER — ETOMIDATE 2 MG/ML IV SOLN
INTRAVENOUS | Status: DC | PRN
  Administered 2019-10-18: 16 mg via INTRAVENOUS

## 2019-10-18 MED ORDER — ONDANSETRON HCL 4 MG/2ML IJ SOLN
INTRAMUSCULAR | Status: DC | PRN
  Administered 2019-10-18: 4 mg via INTRAVENOUS

## 2019-10-18 MED ORDER — OXYCODONE HCL 5 MG/5ML PO SOLN: 5.0000 mg | Freq: Once | ORAL | Status: DC | PRN

## 2019-10-18 MED ORDER — GLYCOPYRROLATE PF 0.2 MG/ML IJ SOSY
PREFILLED_SYRINGE | INTRAMUSCULAR | Status: DC | PRN
  Administered 2019-10-18: .1 mg via INTRAVENOUS

## 2019-10-18 MED ORDER — LACTATED RINGERS IV SOLN: INTRAVENOUS | Status: DC

## 2019-10-18 MED ORDER — LIDOCAINE 2% (20 MG/ML) 5 ML SYRINGE
INTRAMUSCULAR | Status: DC | PRN
  Administered 2019-10-18: 40 mg via INTRAVENOUS
  Administered 2019-10-18 (×2): 20 mg via INTRAVENOUS

## 2019-10-18 MED ORDER — LACTATED RINGERS IV SOLN
250.0000 mL | Freq: Once | INTRAVENOUS | Status: AC
  Administered 2019-10-18: 250 mL via INTRAVENOUS

## 2019-10-18 MED ORDER — DEXAMETHASONE SODIUM PHOSPHATE 10 MG/ML IJ SOLN
INTRAMUSCULAR | Status: DC | PRN
  Administered 2019-10-18: 4 mg via INTRAVENOUS

## 2019-10-18 MED ORDER — BUPIVACAINE LIPOSOME 1.3 % IJ SUSP
20.0000 mL | Freq: Once | INTRAMUSCULAR | Status: AC
  Administered 2019-10-18: 20 mL
  Filled 2019-10-18: qty 20

## 2019-10-18 MED ORDER — OXYCODONE HCL 5 MG PO TABS: 5.0000 mg | ORAL_TABLET | Freq: Once | ORAL | Status: DC | PRN

## 2019-10-18 MED ORDER — BUPIVACAINE HCL (PF) 0.25 % IJ SOLN
INTRAMUSCULAR | Status: DC | PRN
  Administered 2019-10-18: 30 mL

## 2019-10-18 MED ORDER — PHENYLEPHRINE 40 MCG/ML (10ML) SYRINGE FOR IV PUSH (FOR BLOOD PRESSURE SUPPORT)
PREFILLED_SYRINGE | INTRAVENOUS | Status: AC
  Filled 2019-10-18: qty 10

## 2019-10-18 MED ORDER — LIDOCAINE 2% (20 MG/ML) 5 ML SYRINGE
INTRAMUSCULAR | Status: AC
  Filled 2019-10-18: qty 5

## 2019-10-18 MED ORDER — ONDANSETRON HCL 4 MG/2ML IJ SOLN: 4.0000 mg | Freq: Four times a day (QID) | INTRAMUSCULAR | Status: DC | PRN

## 2019-10-18 SURGICAL SUPPLY — 51 items
BAG URINE DRAIN 2000ML AR STRL (UROLOGICAL SUPPLIES) ×2 IMPLANT
CABLE HIGH FREQUENCY MONO STRZ (ELECTRODE) ×4 IMPLANT
COVER SURGICAL LIGHT HANDLE (MISCELLANEOUS) ×4 IMPLANT
COVER WAND RF STERILE (DRAPES) ×2 IMPLANT
DEVICE TROCAR PUNCTURE CLOSURE (ENDOMECHANICALS) ×2 IMPLANT
DRAPE UTILITY XL STRL (DRAPES) ×2 IMPLANT
DRAPE WARM FLUID 44X44 (DRAPES) ×4 IMPLANT
DRSG TEGADERM 2-3/8X2-3/4 SM (GAUZE/BANDAGES/DRESSINGS) ×8 IMPLANT
DRSG TEGADERM 4X4.75 (GAUZE/BANDAGES/DRESSINGS) ×2 IMPLANT
ELECT REM PT RETURN 15FT ADLT (MISCELLANEOUS) ×4 IMPLANT
GAUZE SPONGE 2X2 8PLY STRL LF (GAUZE/BANDAGES/DRESSINGS) ×2 IMPLANT
GLOVE ECLIPSE 8.0 STRL XLNG CF (GLOVE) ×4 IMPLANT
GLOVE INDICATOR 8.0 STRL GRN (GLOVE) ×4 IMPLANT
GOWN STRL REUS W/TWL XL LVL3 (GOWN DISPOSABLE) ×10 IMPLANT
IRRIG SUCT STRYKERFLOW 2 WTIP (MISCELLANEOUS) ×4
IRRIGATION SUCT STRKRFLW 2 WTP (MISCELLANEOUS) ×2 IMPLANT
KIT BASIN OR (CUSTOM PROCEDURE TRAY) ×4 IMPLANT
KIT INTRO ENDO 22F F/GAST TUBE (SET/KITS/TRAYS/PACK) IMPLANT
KIT INTRODUCER MIC G-18 (SET/KITS/TRAYS/PACK) ×4 IMPLANT
KIT TURNOVER KIT A (KITS) IMPLANT
NDL INSUFFLATION 14GA 120MM (NEEDLE) IMPLANT
NEEDLE INSUFFLATION 14GA 120MM (NEEDLE) ×4 IMPLANT
PAD POSITIONING PINK XL (MISCELLANEOUS) ×4 IMPLANT
PROTECTOR NERVE ULNAR (MISCELLANEOUS) ×2 IMPLANT
SCISSORS LAP 5X35 DISP (ENDOMECHANICALS) ×4 IMPLANT
SEALER TISSUE G2 STRG ARTC 35C (ENDOMECHANICALS) IMPLANT
SET TUBE SMOKE EVAC HIGH FLOW (TUBING) ×4 IMPLANT
SHEARS HARMONIC ACE PLUS 36CM (ENDOMECHANICALS) ×2 IMPLANT
SLEEVE XCEL OPT CAN 5 100 (ENDOMECHANICALS) ×8 IMPLANT
SPONGE GAUZE 2X2 STER 10/PKG (GAUZE/BANDAGES/DRESSINGS) ×2
SUT MNCRL AB 4-0 PS2 18 (SUTURE) ×4 IMPLANT
SUT PDS AB 1 CT1 27 (SUTURE) ×4 IMPLANT
SUT PDS AB 3-0 SH 27 (SUTURE) ×12 IMPLANT
SUT PROLENE 0 SH 30 (SUTURE) ×8 IMPLANT
SUT SILK 2 0 (SUTURE) ×2
SUT SILK 2 0 SH (SUTURE) ×4 IMPLANT
SUT SILK 2 0 SH CR/8 (SUTURE) ×2 IMPLANT
SUT SILK 2-0 18XBRD TIE 12 (SUTURE) ×2 IMPLANT
SUT SILK 3 0 (SUTURE) ×2
SUT SILK 3 0 SH CR/8 (SUTURE) ×2 IMPLANT
SUT SILK 3-0 18XBRD TIE 12 (SUTURE) ×2 IMPLANT
SYR TOOMEY IRRIG 70ML (MISCELLANEOUS) ×4
SYRINGE TOOMEY IRRIG 70ML (MISCELLANEOUS) IMPLANT
TOWEL OR 17X26 10 PK STRL BLUE (TOWEL DISPOSABLE) ×4 IMPLANT
TOWEL OR NON WOVEN STRL DISP B (DISPOSABLE) ×2 IMPLANT
TRAY FOLEY MTR SLVR 14FR STAT (SET/KITS/TRAYS/PACK) ×2 IMPLANT
TRAY LAPAROSCOPIC (CUSTOM PROCEDURE TRAY) ×4 IMPLANT
TROCAR ADV FIXATION 5X100MM (TROCAR) ×2 IMPLANT
TROCAR BLADELESS OPT 5 100 (ENDOMECHANICALS) ×4 IMPLANT
TROCAR XCEL NON-BLD 11X100MML (ENDOMECHANICALS) IMPLANT
TUBE GASTROSTOMY 18F (CATHETERS) ×2 IMPLANT

## 2019-10-18 NOTE — Consult Note (Addendum)
[place] [place] Nurse Consult Note: Reason for Consult: Seen today with Acute Rehab Physical Therapist (DPT Kandis Nab) for reassessment of Unstageable pressure injury just prior to hydrotherapy treatment. Her expertise and care delivery technique is appreciated. Patient is s/p feeding tube placement by Dr. Clyda Greener. Wound type:Pressure Pressure Injury POA: Yes Measurement:10 x 7 x 1.5cm with undermining from 11-2 o'clock measuring 2cm at this time.  There is extensive nonviable tissue in this area both at skin level and obscuring wound bed, so depth and extent of destruction is expected to increase. Left IT Unstageable PI measures 2cm x 2cm and depth is obscured by the presence of thick, black eschar. Wound bed: 55% necrotic, 45% clean, nonviable Drainage (amount, consistency, odor) moderate amount tan exudate consistent with autolytically and enzymatically debriding tissue. Periwound: mild periwound erythema, no induration, warmth  Dressing procedure/placement/frequency: Patient is on a mattress replacement with low air loss feature for pressure redisttribution and has bilateral pressure redistribution heel boots.  Hydrotherapy continues 6 days/week with 8 psi.  This is followed by conservative sharp wound debridement performed by PT and topical care with with collagenase (Santyl) ointment and NS dampened gauze. The treatment with collagenase is performed once daily; in the evening, a saline dressing is placed. The gauze dressings are covered with a silicone foam dressing to reduce periwound medical adhesive related skin injury (MARSI).  All barriers to wound healing have been previously stated by members of the medical, surgical and [place] Nursing teams and they continue today including severe protein calorie malnutrition (expected to improve), chronic, progressive neurological disease in end stages, and fecal incontinence (which may worsen with introduction of tube feedings).  [place] nursing team will continue to  follow, seeing patient every 7-10 days and will remain available to this patient, the nursing, surgical and medical teams.  Please re-consult if needed in between visits.  Thanks, Maudie Flakes, MSN, RN, [place], Arther Abbott  Pager# [phone]

## 2019-10-18 NOTE — Progress Notes (Signed)
PROGRESS NOTE    Barbara Flowers  [id number] DOB: 24-Apr-1951 DOA: 09/16/2019 PCP: [name], NP    Brief Narrative:  Patient is here for prolonged period. Previous events and treatments events are based upon previous physicians notes and records as below. 69 year old female with history of breast cancer, dementia, history of CVA with left-sided hemiplegia, from home, wheelchair-bound for last 3 years presented to the emergency room on 09/16/2019 with 1 week of cough, congestion, worsening shortness of breath.  Initially patient was found with extensive left-sided infiltrate and left-sided effusion with hypoxemia corrected with nonrebreather and admitted to ICU.  Events as below: 12/28 thoracentesis 1/2 thoracentesis 1/3 intubated and transferred to ICU 1/8 started vanc for MRSA sputum 1/11 not weaning well 1/13 more awake weaning x 2 hrs->spoke w/ family still want full support 1/14 acceptable rapid shallow. Following commands, extubated.   1/15 looks better ready for floor. PCCM sign off.  1/18 Rapid response overnight, suspected ongoing aspiration, copious thick secretions, poor mentation. PCCM re-engaged , back on vancomycin and Unasyn and treated for 7 days with vanco and unasyn.  1/22 patient is clinically improving, remains with aspiration risk. 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 -Surgery consulted for surgical G-tube placement, patient declined so procedure on hold.  - NG tube feeding started. - patient's family state that she does not understand all consequences of not having the procedure, so husband and two children wanting patient to have PEG tube placement and SNF placement.   Long-term feeding issues/ethics: Patient has fluctuating mental status, patient may have underlying hypoactive delirium.  At times she is more lucid and talks to the nursing staff.   Patient has very poor functional  status and now with persistent dysphagia aspiration.  Maintained on NG tube feeding.  Patient's husband, 2 children all recommending for artificial feeding including PEG tube. I do not anticipate that patient will be eating in the near future given the extent of debility. At the bedside, patient had desired not to have any surgery with the surgical team, she occasionally tells nursing staff that she is tired with all this and does not want to go through it . however since patient's family, spouse who is healthcare power of attorney has expressed that she is cognitively impaired and she does not understand all benefits and side effects, long-term effects of it so husband should be able to make decisions. 1/25 night , Ng tube pulled out. Patient stated that she pulled it out as she does not like it.  Patient is unable to make her decisions by herself. 1/27, going for PEG tube today.    Antibiotics: Rocephin 12/26 >> (14 day course) 1/8 Azithromycin 12/27 > 28 Vancomycin 12/26, 1/7-1/12, 1/18 > 1/24 Unasyn 1/18 > 1/24  Diagnostic data: Covid 19 PCR 12/26 >> neg RVP 12/26 >>neg  Procalcitonin 12/26 >> 2.01, down trended to 0.4 with abx BCx2 12/26 >>Pan sensitive Proteus Mirabilis UC 12/26 >> negative Urine Pneum Ag 12/26 >> positive Respiratory culture 1/6>> MRSA  Assessment & Plan:   Principal Problem:   CAP (community acquired pneumonia) Active Problems:   History of CVA with residual deficit left side   Dementia (HCC)   Pleural effusion on left   Acute prerenal azotemia   Hypernatremia   Pressure injury of skin   Protein-calorie malnutrition, severe (HCC)   Acute hypoxemic respiratory failure (HCC)   Pleural effusion   Acute systolic heart failure ([place])  Failure to thrive in adult   Palliative care by specialist   Goals of care, counseling/discussion   Generalized weakness   Wheelchair dependent 2017-   Memory difficulty   Parkinson's disease ([place])   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  On room air now.  Acute metabolic encephalopathy with history of underlying dementia: CT head negative 1/16.  Mentation fluctuates. With history of previous strokes, contractures and debility.  Repeat CT scan with extensive chronic ischemia.  No new findings.  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 fib with RVR: EF 25 to 30%.  Intermittent A. fib.  Currently in sinus rhythm.  Unable to tolerate anticoagulation at this time.  Hypokalemia: Replaced.  On a schedule replacement with Lasix. Continue close monitoring.  Adequately replaced.  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  Seen by surgery.  Bedside debridement.  PT doing hydrotherapy.  Dysphagia: Going for PEG tube placement today.  Will start diet PEG tube feeding after placement.  Patient is high risk of refeeding syndrome.  Will need close electrolyte monitoring.  DVT prophylaxis: Heparin subcu Code Status: Full code Family Communication: Patient's husband at the bedside.  1/26. Disposition Plan: from home .  Going for PEG tube today.  Probably will need a skilled nursing facility, however patient's husband wants to take her home.  Anticipate discharge on 1/29 if adequate functioning after artificial PEG tube placement.  Consultants:   PCCM,  Surgery,  Palliative medicine  Procedures:   Bedside wound debridement, 1/19  Antimicrobials:  Finished multiple rounds of antibiotics.   Subjective: no Overnight events.  She was going for PEG tube  placement.  Objective: Vitals:   10/18/19 0500 10/18/19 0700 10/18/19 0800 10/18/19 0803  BP:    (!) 145/73  Pulse:  64  69  Resp:  13  14  Temp:   97.9 F (36.6 C) 98.3 F (36.8 C)  TempSrc:    Oral  SpO2:  96%  98%  Weight: 55.5 kg     Height:        Intake/Output Summary (Last 24 hours) at 10/18/2019 1017 Last data filed at 10/18/2019 1000 Gross per 24 hour  Intake 1043.67 ml  Output 1100 ml  Net -56.33 ml   Filed Weights   10/16/19 0500 10/17/19 0500 10/18/19 0500  Weight: 56 kg 55 kg 55.5 kg    Examination:  Physical Exam  Constitutional:  Chronically sick looking, debilitated.  Not in any acute distress.  Flat affect.   HENT:  Head: Normocephalic.  Eyes: Pupils are equal, round, and reactive to light.  Cardiovascular: Regular rhythm.  Respiratory: Breath sounds normal.  On room air.  GI: Bowel sounds are normal.  NG tube infusing.  Musculoskeletal:     Cervical back: Neck supple.     Comments: Bilateral lower leg contractures, not moving. Right upper extremity normal power. Left upper extremity contracture with minimal movement.  Neurological: She is alert.  Flat affect.  Oriented x1 or 2.  She has fluctuating mentation.    Skin:  Stage IV decubital ulcer left sacrum with dressing.  Data Reviewed: I have personally reviewed following labs and imaging studies  CBC: Recent Labs  Lab 10/14/19 0706 10/15/19 0220 10/16/19 0228 10/17/19 0248 10/18/19 0158  WBC 10.7* 14.4* 14.2* 14.8* 10.5  NEUTROABS 6.8 10.3* 10.3* 10.9* 7.0  HGB 9.1* 8.8* 8.7* 9.8* 9.0*  HCT 30.4* 29.5* 29.4* 32.3* 30.4*  MCV 96.8 96.1 97.0 97.3 97.1  PLT 278 311 269 282 [id number]   Basic Metabolic Panel: Recent Labs  Lab 10/12/19 0430 10/12/19 0430 10/13/19 0149 10/14/19 0706 10/15/19 0220 10/16/19 0228 10/17/19 0248  NA 144   < > 142 140 139 138 139  K 4.3   < > 4.1 4.2 4.7 4.7 4.9  CL 107   < > 105 101 102 100 97*  CO2 27   < > 28 31 28 28  32  GLUCOSE 101*   < >  157* 127* 130* 146* 101*  BUN 30*   < > 22 22 29* 34* 37*  CREATININE 0.51   < > 0.49 0.50 0.54 0.62 0.59  CALCIUM 8.2*   < > 8.0* 8.1* 8.2* 8.3* 8.8*  MG 1.9  --  2.3 2.0 2.1  --   --   PHOS 2.3*  --  4.1 3.0 3.5  --   --    < > = values in this interval not displayed.   GFR: Estimated Creatinine Clearance: 58.1 mL/min (by C-G formula based on SCr of 0.59 mg/dL). Liver Function Tests: Recent Labs  Lab 10/13/19 0149 10/14/19 0706 10/15/19 0220 10/16/19 0228 10/17/19 0248  AST 24 16 18 24  32  ALT 28 22 21 21 27   ALKPHOS 76 82 77 72 80  BILITOT 0.6 0.5 0.6 0.6 0.8  PROT 5.8* 5.9* 5.9* 5.9* 6.8  ALBUMIN 2.2* 2.4* 2.4* 2.3* 2.7*   No results for input(s): LIPASE, AMYLASE in the last 168 hours. No results for input(s): AMMONIA in the last 168 hours. Coagulation Profile: No results for input(s): INR, PROTIME in the last 168 hours. Cardiac Enzymes: No results for input(s): CKTOTAL, CKMB, CKMBINDEX, TROPONINI in the last 168 hours. BNP (last 3 results) No results for input(s): PROBNP in the last 8760 hours. HbA1C: No results for input(s): HGBA1C in the last 72 hours. CBG: Recent Labs  Lab 10/17/19 1528 10/17/19 1934 10/18/19 0330 10/18/19 0734 10/18/19 0806  GLUCAP 90 96 93 76 81   Lipid Profile: No results for input(s): CHOL, HDL, LDLCALC, TRIG, CHOLHDL, LDLDIRECT in the last 72 hours. Thyroid Function Tests: No results for input(s): TSH, T4TOTAL, FREET4, T3FREE, THYROIDAB in the last 72 hours. Anemia Panel: No results for input(s): VITAMINB12, FOLATE, FERRITIN, TIBC, IRON, RETICCTPCT in the last 72 hours. Sepsis Labs: No results for input(s): PROCALCITON, LATICACIDVEN in the last 168 hours.  Recent Results (from the past 240 hour(s))  MRSA PCR Screening     Status: Abnormal   Collection Time: 10/09/19  9:21 AM   Specimen: Nasal Mucosa; Nasopharyngeal  Result Value Ref Range Status   MRSA by PCR POSITIVE (A) NEGATIVE Final    Comment:        The GeneXpert MRSA  Assay (FDA approved for NASAL specimens only), is one component of a comprehensive MRSA colonization surveillance program. It is not intended to diagnose MRSA infection nor to guide or monitor treatment for MRSA infections. RESULT CALLED TO, READ BACK BY AND VERIFIED WITH: ROMINES,H. RN @1531  10/09/19 BILLINGSLEY,L Performed at [facility], [place] [address] [place], [place] [address]  Radiology Studies: CT HEAD WO CONTRAST  Result Date: 10/17/2019 CLINICAL DATA:  Altered mental status.  Dysphagia EXAM: CT HEAD WITHOUT CONTRAST TECHNIQUE: Contiguous axial images were obtained from the base of the skull through the vertex without intravenous contrast. COMPARISON:  CT head 10/07/2019 FINDINGS: Brain: Moderate to advanced atrophy. Chronic microvascular ischemic changes in the white matter. Chronic ischemic change in the pons. Chronic infarct left thalamus. Chronic ischemic changes in the cerebellum bilaterally. Right parietal craniotomy. Parenchymal calcification at the craniotomy site is chronic and unchanged. Negative for acute infarct, hemorrhage, mass Vascular: Normal arterial flow voids. Skull: Right parietal craniotomy. Sinuses/Orbits: Mild mucosal edema paranasal sinuses. Bilateral mastoid effusion. Bilateral cataract extraction. Other: None IMPRESSION: Atrophy and extensive chronic ischemia. No acute abnormality and no change from the prior CT. Electronically Signed   By: Franchot Gallo M.D.   On: 10/17/2019 12:44        Scheduled Meds: . [MAR Hold] chlorhexidine  15 mL Mouth Rinse BID  . [MAR Hold] Chlorhexidine Gluconate Cloth  6 each Topical Daily  . [MAR Hold] collagenase   Topical Daily  . [MAR Hold] donepezil  10 mg Per Tube QHS  . [MAR Hold] feeding supplement (PRO-STAT SUGAR FREE 64)  30 mL Per Tube BID  . [MAR Hold] furosemide  40 mg Intravenous Daily  . [MAR Hold] Gerhardt's butt cream   Topical BID  . [MAR Hold] heparin  5,000 Units  Subcutaneous Q8H  . [MAR Hold] mouth rinse  15 mL Mouth Rinse q12n4p  . [MAR Hold] potassium chloride  20 mEq Oral Daily  . [MAR Hold] sennosides  5 mL Per Tube BID   Continuous Infusions: . [MAR Hold] sodium chloride Stopped (10/16/19 1900)  . dextrose 5% lactated ringers Stopped (10/18/19 0750)  . feeding supplement (OSMOLITE 1.5 CAL) Stopped (10/16/19 1950)  . lactated ringers 50 mL/hr at 10/18/19 0833     LOS: 32 days    Time spent: 25 minutes     [name], MD [facility] Pager [phone]

## 2019-10-18 NOTE — Discharge Instructions (Signed)
Gastrostomy Tube Home Guide, Adult A gastrostomy tube, or G-tube, is a tube that is inserted through the abdomen into the stomach. The tube is used to give feedings and medicines when a person is unable to eat and drink enough on his or her own. How to care for a G-tube Supplies needed  Saline solution or clean, warm water and soap.  Cotton swab or gauze.  Precut gauze bandage (dressing) and tape, if needed. Instructions 1. Wash your hands with soap and water. 2. If there is a dressing between the person's skin and the tube, remove it. 3. Check the area where the tube enters the skin. Check for problems such as: ? Redness. ? Swelling. ? Pus-like drainage. ? Extra skin growth. 4. Moisten the cotton swab with the saline solution or soap and water mixture. Gently clean around the insertion site. Remove any drainage or crusting. ? When the G-tube is first put in, a normal saline solution or water can be used to clean the skin. ? Mild soap and warm water can be used when the skin around the G-tube site has healed. 5. If there should be a dressing between the person's skin and the tube, apply it at this time. How to flush a G-tube Flush the G-tube regularly to keep it from clogging. Flush it before and after feedings and as often as told by the health care provider. Supplies needed  Purified or sterile water, warmed. If the person has a weak disease-fighting (immune) system, or if he or she has difficulty fighting off infections (is immunocompromised), use only sterile water. ? If you are unsure about the amount of chemical contaminants in purified or drinking water, use sterile water. ? To purify drinking water by boiling:  Boil water for at least 1 minute. Keep lid over water while it boils. Allow water to cool to room temperature before using.  60cc G-tube syringe. Instructions 1. Wash your hands with soap and water. 2. Draw up 30 mL of warm water in a syringe. 3. Connect the syringe  to the tube. 4. Slowly and gently push the water into the tube. G-tube problems and solutions  If the tube comes out: ? Cover the opening with a clean dressing and tape. ? Call a health care provider right away. ? A health care provider will need to put the tube back in within 4 hours.  If there is skin or scar tissue growing where the tube enters the skin: ? Keep the area clean and dry. ? Secure the tube with tape so that the tube does not move around too much. ? Call a health care provider.  If the tube gets clogged: ? Slowly push warm water into the tube with a large syringe. ? Do not force the fluid into the tube or push an object into the tube. ? If you are not able to unclog the tube, call a health care provider right away. Follow these instructions at home: Feedings  Give feedings at room temperature.  Cover and place unused feedings in the refrigerator.  If feedings are continuous: ? Do not put more than 4 hours worth of feedings in the feeding bag. ? Stop the feedings when you need to give medicine or flush the tube. Be sure to restart the feedings. ? Make sure the person's head is above his or her stomach (upright position). This will prevent choking and discomfort.  Replace feeding bags and syringes as told by the health care provider.  Make  sure the person is in the right position during and after feedings: ? During feedings, the person's position should be in the upright position. ? After a noncontinuous feeding (bolus feeding), have the person stay in the upright position for 1 hour. General instructions  Only use syringes made for G-tubes.  Do not pull or put tension on the tube.  Clamp the tube before removing the cap or disconnecting a syringe.  Measure the length of the G-tube every day from the insertion site to the end of the tube.  If the person's G-tube has a balloon, check the fluid in the balloon every week. The amount of fluid that should be in  the balloon can be found in the manufacturer's specifications.  Make sure the person takes care of his or her oral health, such as by brushing his or her teeth.  Remove excess air from the G-tube as told by the person's health care provider. This is called "venting."  Keep the area where the tube enters the skin clean and dry.  Do not push feedings, medicines, or flushes rapidly. Contact a health care provider if:  The person with the tube has any of these problems: ? Constipation. ? Fever.  There is a large amount of fluid or mucus-like liquid leaking from the tube.  Skin or scar tissue appears to be growing where the tube enters the skin.  The length of tube from the insertion site to the G-tube gets longer. Get help right away if:  The person with the tube has any of these problems: ? Severe abdominal pain. ? Severe tenderness. ? Severe bloating. ? Nausea. ? Vomiting. ? Trouble breathing. ? Shortness of breath.  Any of these problems happen in the area where the tube enters the skin: ? Redness, irritation, swelling, or soreness. ? Pus-like discharge. ? A bad smell.  The tube is clogged and cannot be flushed.  The tube comes out. Summary  A gastrostomy tube, or G-tube, is a tube that is inserted through the abdomen into the stomach. The tube is used to give feedings and medicines when a person is unable to eat and drink enough on his or her own.  Check and clean the insertion site daily as told by the person's health care provider.  Flush the G-tube regularly to keep it from clogging. Flush it before and after feedings and as often as told by the person's health care provider.  Keep the area where the tube enters the skin clean and dry. This information is not intended to replace advice given to you by your health care provider. Make sure you discuss any questions you have with your health care provider. Document Revised: 08/20/2017 Document Reviewed:  11/02/2016 Elsevier Patient Education  [place].    Pressure Injury  A pressure injury is damage to the skin and underlying tissue that results from pressure being applied to an area of the body. It often affects people who must spend a long time in a bed or chair because of a medical condition. Pressure injuries usually occur:  Over bony parts of the body, such as the tailbone, shoulders, elbows, hips, heels, spine, ankles, and back of the head.  Under medical devices that make contact with the body, such as respiratory equipment, stockings, tubes, and splints. Pressure injuries start as reddened areas on the skin and can lead to pain and an open wound. What are the causes? This condition is caused by frequent or constant pressure to an  area of the body. Decreased blood flow to the skin can eventually cause the skin tissue to die and break down, causing a wound. What increases the risk? You are more likely to develop this condition if you:  Are in the hospital or an extended care facility.  Are bedridden or in a wheelchair.  Have an injury or disease that keeps you from: ? Moving normally. ? Feeling pain or pressure.  Have a condition that: ? Makes you sleepy or less alert. ? Causes poor blood flow.  Need to wear a medical device.  Have poor control of your bladder or bowel functions (incontinence).  Have poor nutrition (malnutrition). If you are at risk for pressure injuries, your health care provider may recommend certain types of mattresses, mattress covers, pillows, cushions, or boots to help prevent them. These may include products filled with air, foam, gel, or sand. What are the signs or symptoms? Symptoms of this condition depend on the severity of the injury. Symptoms may include:  Red or dark areas of the skin.  Pain, warmth, or a change of skin texture.  Blisters.  An open wound. How is this diagnosed? This condition is diagnosed with a medical  history and physical exam. You may also have tests, such as:  Blood tests.  Imaging tests.  Blood flow tests. Your pressure injury will be staged based on its severity. Staging is based on:  The depth of the tissue injury, including whether there is exposure of muscle, bone, or tendon.  The cause of the pressure injury. How is this treated? This condition may be treated by:  Relieving or redistributing pressure on your skin. This includes: ? Frequently changing your position. ? Avoiding positions that caused the wound or that can make the wound worse. ? Using specific bed mattresses, chair cushions, or protective boots. ? Moving medical devices from an area of pressure, or placing padding between the skin and the device. ? Using foams, creams, or powders to prevent rubbing (friction) on the skin.  Keeping your skin clean and dry. This may include using a skin cleanser or skin barrier as told by your health care provider.  Cleaning your injury and removing any dead tissue from the wound (debridement).  Placing a bandage (dressing) over your injury.  Using medicines for pain or to prevent or treat infection. Surgery may be needed if other treatments are not working or if your injury is very deep. Follow these instructions at home: Wound care  Follow instructions from your health care provider about how to take care of your wound. Make sure you: ? Wash your hands with soap and water before and after you change your bandage (dressing). If soap and water are not available, use hand sanitizer. ? Change your dressing as told by your health care provider.  Check your wound every day for signs of infection. Have a caregiver do this for you if you are not able. Check for: ? Redness, swelling, or increased pain. ? More fluid or blood. ? Warmth. ? Pus or a bad smell. Skin care  Keep your skin clean and dry. Gently pat your skin dry.  Do not rub or massage your skin.  You or a  caregiver should check your skin every day for any changes in color or any new blisters or sores (ulcers). Medicines  Take over-the-counter and prescription medicines only as told by your health care provider.  If you were prescribed an antibiotic medicine, take or apply it as told  by your health care provider. Do not stop using the antibiotic even if your condition improves. Reducing and redistributing pressure  Do not lie or sit in one position for a long time. Move or change position every 1-2 hours, or as told by your health care provider.  Use pillows or cushions to reduce pressure. Ask your health care provider to recommend cushions or pads for you. General instructions   Eat a healthy diet that includes lots of protein.  Drink enough fluid to keep your urine pale yellow.  Be as active as you can every day. Ask your health care provider to suggest safe exercises or activities.  Do not abuse drugs or alcohol.  Do not use any products that contain nicotine or tobacco, such as cigarettes, e-cigarettes, and chewing tobacco. If you need help quitting, ask your health care provider.  Keep all follow-up visits as told by your health care provider. This is important. Contact a health care provider if:  You have: ? A fever or chills. ? Pain that is not helped by medicine. ? Any changes in skin color. ? New blisters or sores. ? Pus or a bad smell coming from your wound. ? Redness, swelling, or pain around your wound. ? More fluid or blood coming from your wound.  Your wound does not improve after 1-2 weeks of treatment. Summary  A pressure injury is damage to the skin and underlying tissue that results from pressure being applied to an area of the body.  Do not lie or sit in one position for a long time. Your health care provider may advise you to move or change position every 1-2 hours.  Follow instructions from your health care provider about how to take care of your  wound.  Keep all follow-up visits as told by your health care provider. This is important. This information is not intended to replace advice given to you by your health care provider. Make sure you discuss any questions you have with your health care provider. Document Revised: 04/06/2018 Document Reviewed: 04/06/2018 Elsevier Patient Education  [place].

## 2019-10-18 NOTE — Anesthesia Preprocedure Evaluation (Addendum)
Anesthesia Evaluation  Patient identified by MRN, date of birth, ID band Patient confused    Reviewed: Allergy & Precautions, H&P , NPO status , Patient's Chart, lab work & pertinent test results  Airway Mallampati: II   Neck ROM: full    Dental   Pulmonary sleep apnea ,    breath sounds clear to auscultation       Cardiovascular negative cardio ROS   Rhythm:regular Rate:Normal     Neuro/Psych Seizures -,  PSYCHIATRIC DISORDERS Anxiety Depression Dementia TIA Neuromuscular disease CVA    GI/Hepatic   Endo/Other  Hypothyroidism   Renal/GU      Musculoskeletal   Abdominal   Peds  Hematology  (+) Blood dyscrasia, anemia ,   Anesthesia Other Findings   Reproductive/Obstetrics                            Anesthesia Physical Anesthesia Plan  ASA: III  Anesthesia Plan: General   Post-op Pain Management:    Induction: Intravenous  PONV Risk Score and Plan: 3 and Ondansetron, Dexamethasone and Treatment may vary due to age or medical condition  Airway Management Planned: Oral ETT  Additional Equipment:   Intra-op Plan:   Post-operative Plan: Extubation in OR  Informed Consent: I have reviewed the patients History and Physical, chart, labs and discussed the procedure including the risks, benefits and alternatives for the proposed anesthesia with the patient or authorized representative who has indicated his/her understanding and acceptance.       Plan Discussed with: CRNA, Anesthesiologist and Surgeon  Anesthesia Plan Comments:         Anesthesia Quick Evaluation

## 2019-10-18 NOTE — Transfer of Care (Signed)
Immediate Anesthesia Transfer of Care Note  Patient: Kilyn Burdett  Procedure(s) Performed: Procedure(s): LAPAROSCOPIC GASTROSTOMY (N/A) LAPAROSCOPIC LYSIS OF ADHESIONS (N/A) LAPAROSCOPIC INCISIONAL HERNIA REPAIR (N/A)  Patient Location: PACU  Anesthesia Type:General  Level of Consciousness:  sedated, patient cooperative and responds to stimulation  Airway & Oxygen Therapy:Patient Spontanous Breathing and Patient connected to face mask oxgen  Post-op Assessment:  Report given to PACU RN and Post -op Vital signs reviewed and stable  Post vital signs:  Reviewed and stable  Last Vitals:  Vitals:   10/18/19 0800 10/18/19 0803  BP:  (!) 145/73  Pulse:  69  Resp:  14  Temp: 36.6 C 36.8 C  SpO2:  [id number]    Complications: No apparent anesthesia complications

## 2019-10-18 NOTE — Progress Notes (Signed)
SLP Cancellation Note  Patient Details Name: Barbara Flowers MRN: [id number] DOB: 1950-11-07   Cancelled treatment:       Reason Eval/Treat Not Completed: Patient at procedure or test/unavailable(pt off floor at this time) SlP will follow up for family education.    [name] 10/18/2019, 10:28 AM  Kathleen Lime, MS [place] Office [phone]

## 2019-10-18 NOTE — Progress Notes (Signed)
Dr. Marcie Bal made aware of 289ml bolus completed and UO 20 ml's.  Pt stable for d/c back to stepdown.  UO to be monitored on floor

## 2019-10-18 NOTE — Interval H&P Note (Signed)
History and Physical Interval Note:  10/18/2019 8:32 AM  Barbara Flowers  has presented today for surgery, with the diagnosis of FAILURE TO THRIVE, DYSPHASIA.  The various methods of treatment have been discussed with the patient and family. After consideration of risks, benefits and other options for treatment, the patient has consented to  Procedure(s): LAPAROSCOPIC GASTROSTOMY (N/A) LAPAROSCOPY DIAGNOSTIC (N/A) as a surgical intervention.  The patient's history has been reviewed, patient examined, no change in status, stable for surgery.  I have reviewed the patient's chart and labs.  Questions were answered to the patient's satisfaction.     Adin Hector

## 2019-10-18 NOTE — Progress Notes (Signed)
Updated Barbara on UO after completion of bolus.  Aware to watch UO closely.

## 2019-10-18 NOTE — Op Note (Addendum)
10/18/2019  11:36 AM  PATIENT:  Barbara Flowers  69 y.o. female  Patient Care Team: [name], NP as PCP - General (Nurse Practitioner) [name], MD as Referring Physician (Gastroenterology) [name], MD as Referring Physician (Internal Medicine) [name], MD as Referring Physician (Neurology) [name], [name]., MD as Referring Physician (Cardiology)  PRE-OPERATIVE DIAGNOSIS:  FAILURE TO THRIVE, DYSPHASIA  POST-OPERATIVE DIAGNOSIS:  FAILURE TO THRIVE, DYSPHASIA  PROCEDURE:  LAPAROSCOPIC GASTROSTOMY LAPAROSCOPIC LYSIS OF ADHESIONS LAPAROSCOPIC INCISIONAL HERNIA REPAIR  SURGEON:  Adin Hector, MD  ASSISTANT: OR Staff   ANESTHESIA:   local and general  EBL:  Total I/O In: 1145.7 [I.V.:1145.7] Out: 450 [Urine:350; Blood:100]  Delay start of Pharmacological VTE agent (>24hrs) due to surgical blood loss or risk of bleeding:  no  DRAINS:  G Tube   SPECIMEN:  No Specimen  DISPOSITION OF SPECIMEN:  N/A  COUNTS:  YES  PLAN OF CARE: Admit to inpatient   PATIENT DISPOSITION:  PACU - hemodynamically stable.  IINDICATION: Woman with numerous medical problems including metastatic breast cancer with numerous interventions and cancer free.  TIA and strokes.  Unfortunately progressed to being wheelchair bound with contractures.  Admitted with pneumonia with prolonged hospital stay.  Worsening dysphagia.  Family wished feeding gastrostomy tube.  Psychiatry and primary service felt the patient did not have capacity and therefore agreed with proceeding.  Discussed with surgery and palliative care about goals of trying to get her home.  Patient has had prior open gastric bypass decades ago as well as open cholecystectomy.  I recommended laparoscopic possible approach.  The anatomy and physiology of the digestive tract was explained. The need of nutrition to help in patient recovery and survival was discussed Technique of placement of feeding tube through  endoscopic, laparoscopic and open techniques were discussed.  Risks such as bleeding, infection, stroke, heart attack, and death were discussed. Risks of injury to other organs such as intestines were discussed. Long-term issues of catheter occlusion, leak, skin irritation, falling out, need for replacement, and others were discussed. I noted a good likelihood this will help address the problem. Questions answered the patient agrees to proceed   OR FINDINGS:  Moderately dense adhesions of greater omentum to anterior abdominal wall.  15 mm incisional hernia periumbilical.  Primarily repaired.  73 [place] MIC-Key feeding gastrostomy tube in the left subxiphoid region closer to midline given her body habitus re   DESCRIPTION:  Informed consent was confirmed. The patient underwent general anaesthesia without difficulty. The patient was positioned appropriately. VTE prevention in place. The patient's abdomen was clipped, prepped, & draped in a sterile fashion. Surgical timeout confirmed our plan.   The patient was positioned in reverse Trendelenburg.  Varies needle placed along right subcostal ridge.  Passed water drop test.  Had good abdominal insufflation with no elevated end-tidal CO2.  She has moderate contractures and rigidity of her abdominal wall but did have some distention.  Abdominal entry with a 21m laparoscopic port was gained using optical entry technique in the right subcostal ridge away from her numerous incisions.  Entry was clean.  Camera inspection revealed no injury.  Moderate greater omental adhesions involving mostly the anterior abdominal wall.  However were to place a port in the right lower quadrant and left lower quadrant.    Proceeded to lyse adhesions to free the greater omentum off the anterior abdominal wall to better define the anatomy.  In reducing that her reduced a periumbilical small incisional hernia.  I could see probable stomach adherent with the liver above the left  subcostal ridge.  She did have a stellate scar in the left upper quadrant suspicious for a prior G-tube.  Her husband did not recall any feeding tube.  Using a needle to go through that scar noted that this was nowhere near where the probable stomach wass.  I proceeded carefully to lyse adhesions to free the greater omentum off the anterior abdominal wall as well as the liver edge.  I could see a floppy transverse colon that was laying infraumbilically far away from everything.  I could follow that up to the splenic flexure fall high and lateral as well as hepatic flexure plan lateral on the right side as well.  Eventually I freed off this proximal digestive tract mass off the anterior abdominal wall.  Left subcostal region.  I follow that marked proximally and seemed most consistent with the gastric remnant.  It was a blind end pouch.  There was some folds.  I carefully unfolded this section and confirmed that it was the gastric remnant of the stomach.  It seemed to correlate with how the CAT scan had identified.  I was able to get the lesser sac and confirmed that I had the stomach.  I was able to follow the stomach towards the antrum and pylorus confirm that.  There is no connection more proximally arguing against this being the small bowel.  I avoided any more aggressive dissection since she had a prior Roux-en-Y gastrojejunostomy.  With the gastric remnant clearly identified, I used the most proximal end of that and folded down to the left subcostal region.  Because of her contractures and tendency to lie on her right side, I decided to bring the gastrostomy tube a little more midline and higher than average.  I used 3-0 PDS sutures and did laparoscopic suturing to the gastric remnant.  I then pulled the tails of those up through the anterior abdominal wall to help pexy the stomach well to the anterior abdominal wall.  I did 5 interrupted such sutures in a pentagonal pattern to have good opposition of the  stomach to the anterior abdominal wall. This help pull a nice flat region of anterior stomach wall to the perineal cavity.  I then used a laparoscopic gastrostomy kit.  Used a needle in the center of the premarked pentagram sutures and was able to enter into the stomach remnant.  I did air scintillation and got good insufflation.  I passed a guidewire.  I then used the dilator peel-away sheath to good result.  Was able to pass an 60 [place] Mickey through this peel-away sheath into the stomach.  Sheath peeled away.  Balloon insufflated 10 mL of water.  Pulled the balloon up.  Good opposition.  The gastrostomy tube flushed with saline and aspirated quite easily. I did laparoscopic inspection. Saw no injury or other problems elsewhere.  Good seal with no leak to air nor liquid.  I did inspection and ensured hemostasis on her greater omentum and elsewhere.  I primarily repaired the periumbilical incisional hernia using #1 PDS interrupted sutures laparoscopically transversely to good result.  I did careful inspection saw no other abnormalities.  I left her small interloop and colon adhesions and infraumbilical adhesions alone.  I released carbon dioxide and removed ports.  The port sites were closed using 4 Monocryl stitch.  I used Dermabond around the small puncture sites for the PDS sutures tacking the stomach up.  I  did 0 Prolene stitch sutures x3  through the skin with both tails coming up through the premarked holes of the flange of the gastrostomy tube.  I gently tied that down and then tied around the tube itself to help secure it as well given her dementia.  G tube flushed & aspirated easily.  Sterile dressings applied.  Foley catheter being exchanged per stepdown request.  We will have an abdominal binder to help protect the gastrostomy tube as well.   I callled & discussed operative findings, updated the patient's status, discussed probable steps to recovery, and gave postoperative recommendations to the  patient's spouse, Kenniyah Sasaki [phone].    Recommendations were made.  Questions were answered.  He expressed understanding & appreciation.   Adin Hector, MD, FACS, MASCRS Gastrointestinal and Minimally Invasive Surgery  [facility] [address], [place], [place] [address] [phone] Main / Paging [phone] Fax

## 2019-10-18 NOTE — Progress Notes (Signed)
Physical Therapy Hydrotherapy Treatment Note    10/18/19 1700  Subjective Assessment  Subjective pt did not speak today, just returned from PEG placement  Patient and Family Stated Goals none stated  Date of Onset  (present on admission)  Evaluation and Treatment  Evaluation and Treatment Procedures Explained to Patient/Family Yes  Evaluation and Treatment Procedures Patient unable to consent due to mental status  Pressure Injury 09/17/19 Sacrum Medial;Left Unstageable - Full thickness tissue loss in which the base of the injury is covered by slough (yellow, tan, gray, green or brown) and/or eschar (tan, brown or black) in the wound bed. wound w eschar  Date First Assessed/Time First Assessed: 09/17/19 0245   Location: Sacrum  Location Orientation: Medial;Left  Staging: Unstageable - Full thickness tissue loss in which the base of the injury is covered by slough (yellow, tan, gray, green or brown) an...  Dressing Type Gauze (Comment);Foam - Lift dressing to assess site every shift;Moist to dry (Santyl; barrier cream)  Dressing Changed  Dressing Change Frequency Twice a day  State of Healing Non-healing  Site / Wound Assessment Pink;Red;Yellow;Brown;Painful  % Wound base Red or Granulating 40%  % Wound base Yellow/Fibrinous Exudate 45%  % Wound base Black/Eschar 0%  % Wound base Other/Granulation Tissue (Comment) 15% (tan leathery appearance)  Peri-wound Assessment Maceration;Erythema (non-blanchable)  Wound Length (cm) 10 cm  Wound Width (cm) 7 cm  Wound Depth (cm) 1.5 cm  Wound Surface Area (cm^2) 70 cm^2  Wound Volume (cm^3) 105 cm^3  Tunneling (cm) 2 (from 11-2 o'clock)  Drainage Amount Minimal  Drainage Description Serosanguineous;Odor  Treatment Debridement (Selective);Hydrotherapy (Pulse lavage);Packing (Saline gauze) (santyl, barrier cream to close periwound)  Hydrotherapy  Pulsed lavage therapy - wound location sacrum  Pulsed Lavage with Suction (psi) 8 psi  Pulsed  Lavage with Suction - Normal Saline Used 1000 mL  Pulsed Lavage Tip Tip with splash shield  Selective Debridement  Selective Debridement - Location sacrum  Selective Debridement - Tools Used Forceps;Scissors  Selective Debridement - Tissue Removed yellow slough, tan/light brown eschar (removed eschar piece at superior wound 11-2 o'clock  Wound Therapy - Assess/Plan/Recommendations  Wound Therapy - Clinical Statement 69 yo female admitted to hospital with weakness. Hx of CVA with L residual weakness, dementia, Parkinsons, sacral wounds, contractures. WC bound PTA.   Wound Therapy - Functional Problem List CVA with L residual weakness, contractures, Parkinson's  Factors Delaying/Impairing Wound Healing Incontinence;Immobility  Hydrotherapy Plan Debridement;Dressing change;Patient/family education;Pulsatile lavage with suction  Wound Therapy - Frequency 6X / week  Wound Therapy - Current Recommendations WOC nurse;Case manager/social work  Wound Therapy - Follow Up Recommendations Wound Care Center;Skilled nursing facility  Wound Plan Palliative following. Questionable Prognosis. Will plan to continue hydro, dressing changes. Will assess and update recommendations as needed.   Wound Therapy Goals - Improve the function of patient's integumentary system by progressing the wound(s) through the phases of wound healing by:  Decrease Necrotic Tissue to 30  Decrease Necrotic Tissue - Progress Progressing toward goal  Increase Granulation Tissue to 70  Increase Granulation Tissue - Progress Progressing toward goal  Goals/treatment plan/discharge plan were made with and agreed upon by patient/family No, Patient unable to participate in goals/treatment/discharge plan and family unavailable  Time For Goal Achievement 2 weeks  Wound Therapy - Potential for Goals Poor   Time: [id number] - [place], DPT [place] Office: [phone]

## 2019-10-18 NOTE — Anesthesia Procedure Notes (Signed)
Procedure Name: Intubation Date/Time: 10/18/2019 9:13 AM Performed by: Lavina Hamman, CRNA Pre-anesthesia Checklist: Patient identified, Emergency Drugs available, Suction available, Patient being monitored and Timeout performed Patient Re-evaluated:Patient Re-evaluated prior to induction Oxygen Delivery Method: Circle system utilized Preoxygenation: Pre-oxygenation with 100% oxygen Induction Type: IV induction Ventilation: Mask ventilation without difficulty Laryngoscope Size: Glidescope and 3 Grade View: Grade II Tube type: Oral Tube size: 7.0 mm Number of attempts: 1 Airway Equipment and Method: Stylet and Video-laryngoscopy Placement Confirmation: ETT inserted through vocal cords under direct vision,  breath sounds checked- equal and bilateral and CO2 detector Secured at: 21 cm Tube secured with: Tape Dental Injury: Teeth and Oropharynx as per pre-operative assessment  Comments: ATOI

## 2019-10-19 ENCOUNTER — Inpatient Hospital Stay (HOSPITAL_COMMUNITY): Payer: Medicare Other

## 2019-10-19 ENCOUNTER — Encounter: Payer: Self-pay | Admitting: *Deleted

## 2019-10-19 DIAGNOSIS — E440 Moderate protein-calorie malnutrition: Secondary | ICD-10-CM | POA: Insufficient documentation

## 2019-10-19 DIAGNOSIS — G8114 Spastic hemiplegia affecting left nondominant side: Secondary | ICD-10-CM

## 2019-10-19 DIAGNOSIS — I5023 Acute on chronic systolic (congestive) heart failure: Secondary | ICD-10-CM

## 2019-10-19 DIAGNOSIS — I255 Ischemic cardiomyopathy: Secondary | ICD-10-CM

## 2019-10-19 LAB — CBC WITH DIFFERENTIAL/PLATELET
Abs Immature Granulocytes: 0.1 10*3/uL — ABNORMAL HIGH (ref 0.00–0.07)
Basophils Absolute: 0 10*3/uL (ref 0.0–0.1)
Basophils Relative: 0 %
Eosinophils Absolute: 0 10*3/uL (ref 0.0–0.5)
Eosinophils Relative: 0 %
HCT: 24.6 % — ABNORMAL LOW (ref 36.0–46.0)
Hemoglobin: 7.2 g/dL — ABNORMAL LOW (ref 12.0–15.0)
Immature Granulocytes: 1 %
Lymphocytes Relative: 8 %
Lymphs Abs: 1.3 10*3/uL (ref 0.7–4.0)
MCH: 28.9 pg (ref 26.0–34.0)
MCHC: 29.3 g/dL — ABNORMAL LOW (ref 30.0–36.0)
MCV: 98.8 fL (ref 80.0–100.0)
Monocytes Absolute: 1 10*3/uL (ref 0.1–1.0)
Monocytes Relative: 6 %
Neutro Abs: 13.5 10*3/uL — ABNORMAL HIGH (ref 1.7–7.7)
Neutrophils Relative %: 85 %
Platelets: 260 10*3/uL (ref 150–400)
RBC: 2.49 MIL/uL — ABNORMAL LOW (ref 3.87–5.11)
RDW: 15.3 % (ref 11.5–15.5)
WBC: 15.9 10*3/uL — ABNORMAL HIGH (ref 4.0–10.5)
nRBC: 0 % (ref 0.0–0.2)

## 2019-10-19 LAB — GLUCOSE, CAPILLARY
Glucose-Capillary: 121 mg/dL — ABNORMAL HIGH (ref 70–99)
Glucose-Capillary: 112 mg/dL — ABNORMAL HIGH (ref 70–99)
Glucose-Capillary: 120 mg/dL — ABNORMAL HIGH (ref 70–99)
Glucose-Capillary: 94 mg/dL (ref 70–99)
Glucose-Capillary: 138 mg/dL — ABNORMAL HIGH (ref 70–99)

## 2019-10-19 LAB — BASIC METABOLIC PANEL
Anion gap: 10 (ref 5–15)
BUN: 47 mg/dL — ABNORMAL HIGH (ref 8–23)
CO2: 29 mmol/L (ref 22–32)
Calcium: 8.7 mg/dL — ABNORMAL LOW (ref 8.9–10.3)
Chloride: 100 mmol/L (ref 98–111)
Creatinine, Ser: 0.8 mg/dL (ref 0.44–1.00)
GFR calc Af Amer: >60 mL/min (ref >60)
GFR calc non Af Amer: >60 mL/min (ref >60)
Glucose, Bld: 141 mg/dL — ABNORMAL HIGH (ref 70–99)
Potassium: 4.5 mmol/L (ref 3.5–5.1)
Sodium: 139 mmol/L (ref 135–145)

## 2019-10-19 LAB — PREPARE RBC (CROSSMATCH)

## 2019-10-19 LAB — HEMOGLOBIN AND HEMATOCRIT, BLOOD
HCT: 26.2 % — ABNORMAL LOW (ref 36.0–46.0)
Hemoglobin: 8.3 g/dL — ABNORMAL LOW (ref 12.0–15.0)

## 2019-10-19 LAB — ECHOCARDIOGRAM LIMITED
Height: 68 in
Weight: 2070.56 oz

## 2019-10-19 LAB — MAGNESIUM: Magnesium: 2.2 mg/dL (ref 1.7–2.4)

## 2019-10-19 LAB — ABO/RH: ABO/RH(D): A POS

## 2019-10-19 LAB — PHOSPHORUS: Phosphorus: 4.2 mg/dL (ref 2.5–4.6)

## 2019-10-19 MED ORDER — PERFLUTREN LIPID MICROSPHERE
1.0000 mL | INTRAVENOUS | Status: AC | PRN
  Administered 2019-10-19: 4 mL via INTRAVENOUS
  Filled 2019-10-19: qty 10

## 2019-10-19 MED ORDER — PRO-STAT SUGAR FREE PO LIQD
30.0000 mL | Freq: Every day | ORAL | Status: DC
  Administered 2019-10-19 – 2019-10-21 (×3): 30 mL
  Filled 2019-10-19 (×3): qty 30

## 2019-10-19 MED ORDER — POTASSIUM CHLORIDE 20 MEQ/15ML (10%) PO SOLN
20.0000 meq | Freq: Every day | ORAL | Status: DC
  Administered 2019-10-19: 20 meq
  Filled 2019-10-19: qty 15

## 2019-10-19 MED ORDER — SODIUM CHLORIDE 0.9% IV SOLUTION: Freq: Once | INTRAVENOUS | Status: AC

## 2019-10-19 MED ORDER — FREE WATER
100.0000 mL | Status: DC
  Administered 2019-10-19 – 2019-10-21 (×13): 100 mL

## 2019-10-19 MED ORDER — OSMOLITE 1.5 CAL PO LIQD
1000.0000 mL | ORAL | Status: DC
  Administered 2019-10-19 – 2019-10-20 (×2): 1000 mL
  Filled 2019-10-19 (×3): qty 1000

## 2019-10-19 MED ORDER — CARVEDILOL 3.125 MG PO TABS
3.1250 mg | ORAL_TABLET | Freq: Two times a day (BID) | ORAL | Status: DC
  Administered 2019-10-19: 3.125 mg via ORAL
  Filled 2019-10-19: qty 1

## 2019-10-19 NOTE — TOC Progression Note (Signed)
Transition of Care [facility]) - Progression Note    Patient Details  Name: Barbara Flowers MRN: [id number] Date of Birth: 02-27-1951  Transition of [facility] CM/SW Contact  Ross Ludwig, [place] Phone Number: 10/19/2019, 11:06 AM  Clinical Narrative:     CSW received referral for SNF placement, CSW spoke to patient's husband Christia Reading [phone] and he does not want her to go to SNF, he would rather have her come home with home health.  CSW explained that patient will not have nursing everyday, patient's husband expressed understanding.  CSW asked if had a preference for agencies and he said he does not.  CSW provided different agencies, and he said whoever has availability would be fine.  CSW contacted Malachy Mood at [facility] and they are able to accept patient, and may be able to get a nurse to see her more frequently due to numerous medical issues.  CSW asked patient's husband what equipment he may need.  Patient's husband stated that he will need supplies for patient's tube feedings, and also a pole to hold feedings.  Patient's husband also requested a hospital bed, and he said they have a wheelchair, but it has some problems with it.  Patient's husband would like to speak to DME provider for clarification on equipment.  CSW contacted Zack at [facility], and he will follow up with patient's husband.           Expected Discharge Plan and Services                                                 Social Determinants of Health (SDOH) Interventions    Readmission Risk Interventions Readmission Risk Prevention Plan 09/20/2019  Transportation Screening Complete  Home Care Screening Complete  Some recent data might be hidden

## 2019-10-19 NOTE — Progress Notes (Signed)
Appreciate neuro input. Per d/w Dr. Audie Box, await echocardiogram result and decide on anticoag from there.

## 2019-10-19 NOTE — Progress Notes (Signed)
  Echocardiogram 2D Echocardiogram has been performed.  Barbara Flowers 10/19/2019, 4:17 PM

## 2019-10-19 NOTE — Progress Notes (Signed)
Barbara Espinal MD:[id number] 29-May-1951  CARE TEAM:  PCP: [name], NP  Outpatient Care Team: Patient Care Team: [name], NP as PCP - General (Nurse Practitioner) [name], MD as Referring Physician (Gastroenterology) [name], MD as Referring Physician (Internal Medicine) [name], MD as Referring Physician (Neurology) [name], [name]., MD as Referring Physician (Cardiology)  Inpatient Treatment Team: Treatment Team: Attending Provider: [name], MD; Occupational Therapist: [name], OT; Spray Nurse: [name], RN; Rounding Team: [name], MD; Consulting Physician: [name], Md, MD; Registered Nurse: [name], RN; Registered Nurse: [name], RN; Physical Therapist: [name], PT; Registered Nurse: [name], RN; Utilization Review: [name], RN; Registered Nurse: [name], RN; Registered Nurse: [name], RN   Problem List:   Principal Problem:   CAP (community acquired pneumonia) Active Problems:   Failure to thrive in adult   History of CVA with residual deficit left side   Wheelchair dependent 2017-   Sacral decubitus ulcer, stage IV (HCC)   Left spastic hemiparesis with h/o strokes   Dementia ([place])   Pleural effusion on left   Acute prerenal azotemia   Hypernatremia   Pressure injury of skin   Protein-calorie malnutrition, severe ([place])   Acute hypoxemic respiratory failure ([place])   Pleural effusion   Acute systolic heart failure ([place])   Palliative care by specialist   Goals of care, counseling/discussion   Generalized weakness   Memory difficulty   Parkinson's disease ([place])   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   -other plans & disposition per primary service -VTE prophylaxis- SCDs, etc -mobilize as tolerated to help recovery  Surgery will sign off.  Call w questions  I updated the patient's status to the patient and nurse.  Recommendations were made.  Questions were answered.  [name] the nurse expressed understanding & appreciation.   20 minutes spent in review, evaluation, examination, counseling, and coordination of care.  More than 50% of that time was spent in counseling.  10/19/2019    Subjective: (Chief complaint)  Patient resting No agitation nor other events TFs ordered - due to begin this AM  Objective:  Vital signs:  Vitals:   10/18/19 2300 10/18/19 2316 10/19/19 0000 10/19/19 0400  BP:   (!) 104/43 (!) 126/56  Pulse: 66  (!) 58 61  Resp: 14  (!) 0 12  Temp:  97.9 F (36.6 C)    TempSrc:  Axillary    SpO2: 100%  99% 98%  Weight:    58.7 kg  Height:        Last BM Date: 10/14/19  Intake/Output   Yesterday:  01/27 0701 - 01/28 0700 In: 2708.3 [I.V.:2359; NG/GT:269.3] Out: 850 [Urine:725; Drains:25; Blood:100] This shift:  No intake/output data recorded.  Bowel function:  Flatus: YES  BM:  No  Drain: G tube w minimal drainage to gravity   Physical Exam:  General: Patient awakens in NAD.  Tired but not toxic.  Very cachectic Eyes: PERRL, normal EOM.  Sclera clear.  No icterus Lymph: No head/neck/groin lymphadenopathy Psych:  Oriented to self mainly.  Withdrawn.   Not agitated. HENT: Normocephalic, Mucus membranes dry.  Moderate hair loss .  No thrush Neck: Supple, No tracheal deviation.  No obvious thyromegaly Chest: No audible wheezing no chest wall pain w good excursion CV:  Pulses intact.  Regular rhythm MS: Contractures at L wrist & BLE stable.  Moving RUE OK  Abdomen: G tube site in upper abdomen  clean.  Port site dressings stable.  Soft.  Nondistended.  Nontender.  No evidence of peritonitis.  No incarcerated hernias.  Skin: No petechiae / purpura.  Hands warm and dry Results:   Cultures: Recent Results (from the past 720 hour(s))  Culture, body fluid-bottle     Status: None   Collection Time: 09/23/19 12:30 PM   Specimen: Pleura  Result Value Ref Range Status   Specimen Description PLEURAL  Final   Special Requests NONE  Final   Culture   Final    NO GROWTH 5 DAYS Performed at [facility], [address], [place], [place] [address]    Report Status 09/28/2019 FINAL  Final  Gram stain     Status: None   Collection Time: 09/23/19 12:30 PM   Specimen: Pleura  Result Value Ref Range Status   Specimen Description PLEURAL  Final   Special Requests NONE  Final   Gram Stain   Final    NO WBC SEEN NO ORGANISMS SEEN Performed at [facility], [place] [address], [place], [place] [address]    Report Status 09/24/2019 FINAL  Final  Culture, respiratory (non-expectorated)     Status: None   Collection Time: 09/27/19  8:01 AM   Specimen: Tracheal Aspirate; Respiratory  Result Value Ref Range Status   Specimen Description   Final    TRACHEAL ASPIRATE Performed at [place] [address] [place], [place] [address]    Special Requests   Final    NONE Performed at [facility], [place] [address] [place], [place] [address]    Gram Stain   Final    RARE WBC PRESENT, PREDOMINANTLY PMN RARE GRAM POSITIVE COCCI IN PAIRS Performed at [facility], [place] [address], [place],  [address]    Culture FEW METHICILLIN RESISTANT STAPHYLOCOCCUS AUREUS  Final   Report Status 09/29/2019 FINAL  Final   Organism ID, Bacteria METHICILLIN RESISTANT STAPHYLOCOCCUS AUREUS  Final      Susceptibility   Methicillin resistant staphylococcus aureus - MIC*    CIPROFLOXACIN >=8 RESISTANT Resistant     ERYTHROMYCIN >=8 RESISTANT Resistant      GENTAMICIN <=0.5 SENSITIVE Sensitive     OXACILLIN >=4 RESISTANT Resistant     TETRACYCLINE 2 SENSITIVE Sensitive     VANCOMYCIN 1 SENSITIVE Sensitive     TRIMETH/SULFA <=10 SENSITIVE Sensitive     CLINDAMYCIN >=8 RESISTANT Resistant     RIFAMPIN <=0.5 SENSITIVE Sensitive     Inducible Clindamycin NEGATIVE Sensitive     * FEW METHICILLIN RESISTANT STAPHYLOCOCCUS AUREUS  MRSA PCR Screening     Status: Abnormal   Collection Time: 10/09/19  9:21 AM   Specimen: Nasal Mucosa; Nasopharyngeal  Result Value Ref Range Status   MRSA by PCR POSITIVE (A) NEGATIVE Final    Comment:        The GeneXpert MRSA Assay (FDA approved for NASAL specimens only), is one component of a comprehensive MRSA colonization surveillance program. It is not intended to diagnose MRSA infection nor to guide or monitor  treatment for MRSA infections. RESULT CALLED TO, READ BACK BY AND VERIFIED WITH: ROMINES,H. RN @1531  10/09/19 BILLINGSLEY,L Performed at [facility], [place] [address] [place], [place] [address]     Labs: Results for orders placed or performed during the hospital encounter of 09/16/19 (from the past 48 hour(s))  Glucose, capillary     Status: None   Collection Time: 10/17/19 12:24 PM  Result Value Ref Range   Glucose-Capillary 98 70 - 99 mg/dL   Comment 1 Notify RN    Comment 2 Document in Chart   Glucose, capillary     Status: None   Collection Time: 10/17/19  3:28 PM  Result Value Ref Range   Glucose-Capillary 90 70 - 99 mg/dL   Comment 1 Notify RN    Comment 2 Document in Chart   Glucose, capillary     Status: None   Collection Time: 10/17/19  7:34 PM  Result Value Ref Range   Glucose-Capillary 96 70 - 99 mg/dL  Prealbumin     Status: Abnormal   Collection Time: 10/18/19  1:58 AM  Result Value Ref Range   Prealbumin 8.8 (L) 18 - 38 mg/dL    Comment: Performed at [facility], [place] [address], [place], [place] [address]  CBC with  Differential/Platelet     Status: Abnormal   Collection Time: 10/18/19  1:58 AM  Result Value Ref Range   WBC 10.5 4.0 - 10.5 K/uL   RBC 3.13 (L) 3.87 - 5.11 MIL/uL   Hemoglobin 9.0 (L) 12.0 - 15.0 g/dL   HCT 30.4 (L) 36.0 - 46.0 %   MCV 97.1 80.0 - 100.0 fL   MCH 28.8 26.0 - 34.0 pg   MCHC 29.6 (L) 30.0 - 36.0 g/dL   RDW 15.0 11.5 - 15.5 %   Platelets 265 150 - 400 K/uL   nRBC 0.0 0.0 - 0.2 %   Neutrophils Relative % 65 %   Neutro Abs 7.0 1.7 - 7.7 K/uL   Lymphocytes Relative 22 %   Lymphs Abs 2.3 0.7 - 4.0 K/uL   Monocytes Relative 9 %   Monocytes Absolute 0.9 0.1 - 1.0 K/uL   Eosinophils Relative 2 %   Eosinophils Absolute 0.2 0.0 - 0.5 K/uL   Basophils Relative 1 %   Basophils Absolute 0.1 0.0 - 0.1 K/uL   Immature Granulocytes 1 %   Abs Immature Granulocytes 0.10 (H) 0.00 - 0.07 K/uL    Comment: Performed at [facility], [place] [address] [place],  [address]  Glucose, capillary     Status: None   Collection Time: 10/18/19  3:30 AM  Result Value Ref Range   Glucose-Capillary 93 70 - 99 mg/dL  Glucose, capillary     Status: None   Collection Time: 10/18/19  7:34 AM  Result Value Ref Range   Glucose-Capillary 76 70 - 99 mg/dL  Glucose, capillary     Status: None   Collection Time: 10/18/19  8:06 AM  Result Value Ref Range   Glucose-Capillary 81 70 - 99 mg/dL  Glucose, capillary     Status: Abnormal   Collection Time: 10/18/19  7:33 PM  Result Value Ref Range   Glucose-Capillary 130 (H) 70 - 99 mg/dL   Comment 1 Glucose Stabilizer    Comment 2 Procedure Error   CBC with Differential/Platelet     Status: Abnormal   Collection Time: 10/19/19  5:07 AM  Result Value Ref Range   WBC 15.9 (H)  4.0 - 10.5 K/uL   RBC 2.49 (L) 3.87 - 5.11 MIL/uL   Hemoglobin 7.2 (L) 12.0 - 15.0 g/dL   HCT 24.6 (L) 36.0 - 46.0 %   MCV 98.8 80.0 - 100.0 fL   MCH 28.9 26.0 - 34.0 pg   MCHC 29.3 (L) 30.0 - 36.0 g/dL   RDW 15.3 11.5 - 15.5 %   Platelets 260 150 - 400  K/uL   nRBC 0.0 0.0 - 0.2 %   Neutrophils Relative % 85 %   Neutro Abs 13.5 (H) 1.7 - 7.7 K/uL   Lymphocytes Relative 8 %   Lymphs Abs 1.3 0.7 - 4.0 K/uL   Monocytes Relative 6 %   Monocytes Absolute 1.0 0.1 - 1.0 K/uL   Eosinophils Relative 0 %   Eosinophils Absolute 0.0 0.0 - 0.5 K/uL   Basophils Relative 0 %   Basophils Absolute 0.0 0.0 - 0.1 K/uL   Immature Granulocytes 1 %   Abs Immature Granulocytes 0.10 (H) 0.00 - 0.07 K/uL    Comment: Performed at [facility], [place] [address], [place], [place] [id number]  Basic metabolic panel     Status: Abnormal   Collection Time: 10/19/19  5:07 AM  Result Value Ref Range   Sodium 139 135 - 145 mmol/L   Potassium 4.5 3.5 - 5.1 mmol/L   Chloride 100 98 - 111 mmol/L   CO2 29 22 - 32 mmol/L   Glucose, Bld 141 (H) 70 - 99 mg/dL   BUN 47 (H) 8 - 23 mg/dL   Creatinine, Ser 0.80 0.44 - 1.00 mg/dL   Calcium 8.7 (L) 8.9 - 10.3 mg/dL   GFR calc non Af Amer >60 >60 mL/min   GFR calc Af Amer >60 >60 mL/min   Anion gap 10 5 - 15    Comment: Performed at [facility], [place] [address], [place], [place] [address]  Magnesium     Status: None   Collection Time: 10/19/19  5:07 AM  Result Value Ref Range   Magnesium 2.2 1.7 - 2.4 mg/dL    Comment: Performed at [facility], [place] [address], [place], [place] [address]  Phosphorus     Status: None   Collection Time: 10/19/19  5:07 AM  Result Value Ref Range   Phosphorus 4.2 2.5 - 4.6 mg/dL    Comment: Performed at [facility], [place] [address], [place], [place] [address]  Glucose, capillary     Status: Abnormal   Collection Time: 10/19/19  5:07 AM  Result Value Ref Range   Glucose-Capillary 121 (H) 70 - 99 mg/dL  Glucose, capillary     Status: Abnormal   Collection Time: 10/19/19  7:45 AM  Result Value Ref Range   Glucose-Capillary 112 (H) 70 - 99 mg/dL   Comment 1 Notify RN    Comment 2 Document in Chart     Imaging /  Studies: CT HEAD WO CONTRAST  Result Date: 10/17/2019 CLINICAL DATA:  Altered mental status.  Dysphagia EXAM: CT HEAD WITHOUT CONTRAST TECHNIQUE: Contiguous axial images were obtained from the base of the skull through the vertex without intravenous contrast. COMPARISON:  CT head 10/07/2019 FINDINGS: Brain: Moderate to advanced atrophy. Chronic microvascular ischemic changes in the white matter. Chronic ischemic change in the pons. Chronic infarct left thalamus. Chronic ischemic changes in the cerebellum bilaterally. Right parietal craniotomy. Parenchymal calcification at the craniotomy site is chronic and unchanged. Negative for acute infarct, hemorrhage, mass Vascular: Normal  arterial flow voids. Skull: Right parietal craniotomy. Sinuses/Orbits: Mild mucosal edema paranasal sinuses. Bilateral mastoid effusion. Bilateral cataract extraction. Other: None IMPRESSION: Atrophy and extensive chronic ischemia. No acute abnormality and no change from the prior CT. Electronically Signed   By: Franchot Gallo M.D.   On: 10/17/2019 12:44    Medications / Allergies: per chart  Antibiotics: Anti-infectives (From admission, onward)   Start     Dose/Rate Route Frequency Ordered Stop   10/18/19 0600  cefoTEtan (CEFOTAN) 2 g in sodium chloride 0.9 % 100 mL IVPB     2 g 200 mL/hr over 30 Minutes Intravenous On call to O.R. 10/17/19 1640 10/18/19 0901   10/10/19 1100  vancomycin (VANCOCIN) IVPB 1000 mg/200 mL premix     1,000 mg 200 mL/hr over 60 Minutes Intravenous Every 24 hours 10/09/19 0922 10/15/19 1220   10/09/19 1030  vancomycin (VANCOREADY) IVPB 1250 mg/250 mL     1,250 mg 166.7 mL/hr over 90 Minutes Intravenous  Once 10/09/19 0911 10/09/19 1458   10/09/19 1000  Ampicillin-Sulbactam (UNASYN) 3 g in sodium chloride 0.9 % 100 mL IVPB     3 g 200 mL/hr over 30 Minutes Intravenous Every 6 hours 10/09/19 0901 10/15/19 2232   10/02/19 2100  vancomycin (VANCOCIN) IVPB 1000 mg/200 mL premix  Status:   Discontinued     1,000 mg 200 mL/hr over 60 Minutes Intravenous Every 24 hours 10/02/19 1404 10/05/19 0849   09/29/19 1400  vancomycin (VANCOREADY) IVPB 1500 mg/300 mL  Status:  Discontinued     1,500 mg 150 mL/hr over 120 Minutes Intravenous Every 24 hours 09/28/19 1228 10/02/19 1404   09/28/19 1300  vancomycin (VANCOREADY) IVPB 1750 mg/350 mL     1,750 mg 175 mL/hr over 120 Minutes Intravenous  Once 09/28/19 1228 09/28/19 1451   09/17/19 1000  azithromycin (ZITHROMAX) 500 mg in sodium chloride 0.9 % 250 mL IVPB  Status:  Discontinued     500 mg 250 mL/hr over 60 Minutes Intravenous Every 24 hours 09/17/19 0920 09/18/19 1157   09/16/19 2200  cefTRIAXone (ROCEPHIN) 2 g in sodium chloride 0.9 % 100 mL IVPB     2 g 200 mL/hr over 30 Minutes Intravenous Every 24 hours 09/16/19 1745 09/29/19 2216   09/16/19 1745  cefTRIAXone (ROCEPHIN) injection 1 g  Status:  Discontinued     1 g Intramuscular Every 24 hours 09/16/19 1740 09/16/19 1743   09/16/19 1515  ceFEPIme (MAXIPIME) 2 g in sodium chloride 0.9 % 100 mL IVPB     2 g 200 mL/hr over 30 Minutes Intravenous  Once 09/16/19 1513 09/16/19 1834   09/16/19 1515  metroNIDAZOLE (FLAGYL) IVPB 500 mg  Status:  Discontinued     500 mg 100 mL/hr over 60 Minutes Intravenous  Once 09/16/19 1513 09/16/19 1741   09/16/19 1515  vancomycin (VANCOCIN) IVPB 1000 mg/200 mL premix  Status:  Discontinued     1,000 mg 200 mL/hr over 60 Minutes Intravenous  Once 09/16/19 1513 09/16/19 1834        Note: Portions of this report may have been transcribed using voice recognition software. Every effort was made to ensure accuracy; however, inadvertent computerized transcription errors may be present.   Any transcriptional errors that result from this process are unintentional.     Adin Hector, MD, FACS, MASCRS Gastrointestinal and Minimally Invasive Surgery    [address] [place], [place] [address] [phone] Main / Paging [phone] Fax Please see Amion  for pager number, especial 5pm - 7am.

## 2019-10-19 NOTE — Consult Note (Addendum)
Cardiology Consultation:   Patient ID: Barbara Flowers MRN: [id number]; DOB: 03-31-1951  Admit date: 09/16/2019 Date of Consult: 10/19/2019  Primary Care Provider: [name], NP Primary Cardiologist: Evalina Field, MD  Primary Electrophysiologist:  None   Patient Profile:   Barbara Flowers is a 69 y.o. 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 Sloan Leiter.  History of Present Illness:   History is obtained by extensive chart review given patient's baseline mental status and inability to provide information. She has not been seen by cardiology in our system before but had an admission in 2016 for TIA that references having been diagnosed with an LV aneurysm prior to moving to [place] from [facility]. 2D echo was unrevealing for such at our facility and showed normal LV function. Neurology recommended Plavix at that time, noting concomitant hx of brain aneurysm bleed, ischemic stroke and brain malignancy. At baseline she has been in poor health, with sundowning and dementia superimposed on the above medical problems. Her husband has been her caregiver and advocate. Family members are in the medical field as well - son is a Stage manager at [employer] and daughter is an NP. She was admitted with 1-week history of cough, congestion, worsening SOB and found to have extensive left sided infiltrate/effusion with hypoxemia. She was ultimately found to have MRSA PNA in the setting of poor cough clearance and  extreme debility. She has been admitted for 32 days so far. She required thoracentesis, intubation, extubation, and re-engagement of rapid response on 1/18. Other issues include acute metabolic encephalopathy with flucutating mental status, hypokalemia, stage IV sacral ulcer, dysphagia requiring PEG tube placement, and anemia. During admission she underwent gastrostomy, lysis of adhesions and incisional hernia repair as well. Palliative medicine has been involved as well, aggressive treatment and full code pursued. 2D echo 09/24/19 showed EF 25-30%, grade 2 DD, severely reduced RV function, moderately enlarged RV size, akinesis of the basal and mid anteroseptal, anterior, anterolateral walls and aneurysmal dilatation of the apical segments, and suspicion for apical thrombus. She has also had paroxysmal atrial fib/flutter during admission. EKGs reviewed, several consistent with atrial flutter and one tracing with SVT, possibly atrial tachycardia. Notes indicate anticoagulation was not started due to extreme debility.  She appears comfortable in bed this morning lying on her side, awakens when called, knows her name and husband's name, follows simple commands. Otherwise is unable to tell me where she is or why she is here.   Past Medical History:  Diagnosis Date  . Anemia   . Anxiety disorder   . Blood transfusion without reported diagnosis   . Brain cancer [facility])    s/p sterotactic radio surgery  . Brain tumor ([place])   . Breast cancer ([place]) 1991   history of breast cancer status post left lumpectomy, chemotherapy, and radiation in the 1990s  . Cerebrovascular disease   . Dementia ([place])   . Gastric bypass status for obesity   . H/O bone marrow transplant ([place])   . Hypothyroidism   . IBS (irritable bowel syndrome)   . MDD (major depressive disorder)   . Memory loss   .  Obesity   . Orthostatic hypotension   . Seizures ([place])   . 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[place])   . Stroke [facility]) 2005   hemorrhagic  .  Subdural hematoma ([place])   . Sundowning   . TIA (transient ischemic attack) 2016  . Upper brachial plexus paralysis syndrome   . Urinary incontinence     Past Surgical History:  Procedure Laterality Date  . BONE MARROW TRANSPLANT  1991  . BREAST LUMPECTOMY Left 1991   post chemotherapy and radiation  . CHOLECYSTECTOMY    . CRANIOTOMY FOR HEMISPHERECTOMY TOTAL / PARTIAL Right 2010   Brain Metastasis - right temporal lobe - excised  . GASTRIC BYPASS  1985  . HAND SURGERY Left 2015  . HAND TENDON SURGERY Left    for brachial plexus injury  . INCISIONAL HERNIA REPAIR N/A 10/18/2019   Procedure: LAPAROSCOPIC INCISIONAL HERNIA REPAIR;  Surgeon: Michael Boston, MD;  Location: [facility] ORS;  Service: General;  Laterality: N/A;  . LAPAROSCOPIC GASTROSTOMY N/A 10/18/2019   Procedure: LAPAROSCOPIC GASTROSTOMY;  Surgeon: Michael Boston, MD;  Location: [facility];  Service: General;  Laterality: N/A;  . LAPAROSCOPIC LYSIS OF ADHESIONS N/A 10/18/2019   Procedure: LAPAROSCOPIC LYSIS OF ADHESIONS;  Surgeon: Michael Boston, MD;  Location: [facility];  Service: General;  Laterality: N/A;  . LYMPH NODE BIOPSY       Home Medications:  Prior to Admission medications   Medication Sig Start Date End Date Taking? Authorizing Provider  ALPRAZolam Duanne Moron) 1 MG tablet Take 1 mg by mouth at bedtime as needed for anxiety.   Yes [provider]  Ascorbic Acid (VITAMIN C) 1000 MG tablet Take 1,000 mg by mouth daily.   Yes [provider]  aspirin EC 81 MG tablet Take 81 mg by mouth daily.   Yes [provider]  atorvastatin (LIPITOR) 20 MG tablet Take 20 mg by mouth at bedtime. 09/05/19  Yes [provider]  azithromycin (ZITHROMAX) 250 MG tablet Take 1 tablet by mouth taper from 4 doses each day to 1 dose and stop. 09/11/19  Yes [provider]  Cholecalciferol (VITAMIN D3) 50 MCG (2000 UT) TABS Take 2,000 Units by mouth daily.   Yes [provider]  donepezil (ARICEPT) 10 MG  tablet Take 10 mg by mouth at bedtime.   Yes [provider]  ferrous sulfate 325 (65 FE) MG EC tablet Take 325 mg by mouth daily with breakfast.   Yes [provider]  memantine (NAMENDA) 10 MG tablet Take 10 mg by mouth 2 (two) times daily.    Yes [provider]  midodrine (PROAMATINE) 10 MG tablet Take 10 mg by mouth 3 (three) times daily.    Yes [provider]  Multiple Vitamins-Minerals (MULTIVITAMIN WITH MINERALS) tablet Take 1 tablet by mouth daily.   Yes [provider]  pantoprazole (PROTONIX) 40 MG tablet Take 40 mg by mouth daily. 08/29/19  Yes [provider]  Potassium 99 MG TABS Take 99 mg by mouth daily.   Yes [provider]  QUEtiapine (SEROQUEL) 100 MG tablet Take 100 mg by mouth at bedtime.   Yes [provider]  chlorhexidine (PERIDEX) 0.12 % solution 15 mLs by Mouth Rinse route 2 (two) times daily. Patient not taking: Reported on 09/16/2019 05/27/17   Georgette Shell, MD  magnesium oxide (MAG-OX) 400 (241.3 Mg) MG tablet Take 1 tablet (400 mg total) by mouth 2 (two) times daily. Patient not taking: Reported on 09/16/2019 05/27/17   Landis Gandy  G, MD    Inpatient Medications: Scheduled Meds: . sodium chloride   Intravenous Once  . chlorhexidine  15 mL Mouth Rinse BID  . Chlorhexidine Gluconate Cloth  6 each Topical Daily  . collagenase   Topical Daily  . donepezil  10 mg Per Tube QHS  . [START ON 10/20/2019] feeding supplement (PRO-STAT SUGAR FREE 64)  30 mL Per Tube Daily  . free water  100 mL Per Tube Q4H  . furosemide  40 mg Intravenous Daily  . Gerhardt's butt cream   Topical BID  . heparin  5,000 Units Subcutaneous Q8H  . mouth rinse  15 mL Mouth Rinse q12n4p  . potassium chloride  20 mEq Per Tube Daily  . sennosides  5 mL Per Tube BID   Continuous Infusions: . sodium chloride Stopped (10/16/19 1900)  . feeding supplement (OSMOLITE 1.5 CAL)     PRN Meds: sodium chloride,  acetaminophen (TYLENOL) oral liquid 160 mg/5 mL, albuterol, dextrose, docusate, labetalol, sodium chloride flush  Allergies:    Allergies  Allergen Reactions  . Gabapentin Other (See Comments)    seizure  . Tramadol Other (See Comments)    Brings on Seizures     Social History:   Social History   Socioeconomic History  . Marital status: Married    Spouse name: Not on file  . Number of children: 1  . Years of education: Not on file  . Highest education level: Not on file  Occupational History  . Not on file  Tobacco Use  . Smoking status: Never Smoker  . Smokeless tobacco: Never Used  Substance and Sexual Activity  . Alcohol use: Yes    Alcohol/week: 0.0 standard drinks    Comment: socially  . Drug use: Never  . Sexual activity: Not on file  Other Topics Concern  . Not on file  Social History Narrative   Patient lives at home with her family.   Caffeine Use: Yes      Moved from [place]  To [place] 2016   Social Determinants of Health   Financial Resource Strain:   . Difficulty of Paying Living Expenses: Not on file  Food Insecurity:   . Worried About Charity fundraiser in the Last Year: Not on file  . Ran Out of Food in the Last Year: Not on file  Transportation Needs:   . Lack of Transportation (Medical): Not on file  . Lack of Transportation (Non-Medical): Not on file  Physical Activity:   . Days of Exercise per Week: Not on file  . Minutes of Exercise per Session: Not on file  Stress:   . Feeling of Stress : Not on file  Social Connections:   . Frequency of Communication with Friends and Family: Not on file  . Frequency of Social Gatherings with Friends and Family: Not on file  . Attends Religious Services: Not on file  . Active Member of Clubs or Organizations: Not on file  . Attends Archivist Meetings: Not on file  . Marital Status: Not on file  Intimate Partner Violence:   . Fear of Current or Ex-Partner: Not on file  . Emotionally Abused:  Not on file  . Physically Abused: Not on file  . Sexually Abused: Not on file    Family History:    Family History  Problem Relation Age of Onset  . Hypertension Mother   . Stroke Mother   . Heart disease Father   . Hypertension Father  ROS:  Please see the history of present illness.  All other ROS reviewed and negative.     Physical Exam/Data:   Vitals:   10/18/19 2316 10/19/19 0000 10/19/19 0400 10/19/19 0800  BP:  (!) 104/43 (!) 126/56 102/77  Pulse:  (!) 58 61 (!) 58  Resp:  (!) 0 12 10  Temp: 97.9 F (36.6 C)   (!) 97.4 F (36.3 C)  TempSrc: Axillary   Axillary  SpO2:  99% 98% 99%  Weight:   58.7 kg   Height:        Intake/Output Summary (Last 24 hours) at 10/19/2019 1106 Last data filed at 10/19/2019 0630 Gross per 24 hour  Intake 1562.58 ml  Output 750 ml  Net 812.58 ml   Last 3 Weights 10/19/2019 10/18/2019 10/17/2019  Weight (lbs) 129 lb 6.6 oz 122 lb 5.7 oz 121 lb 4.1 oz  Weight (kg) 58.7 kg 55.5 kg 55 kg     Body mass index is 19.68 kg/m.  VGeneral: Frail appearing WF in no acute distress. Head: Normocephalic, atraumatic, sclera non-icteric, no xanthomas, nares are without discharge. Neck: Negative for carotid bruits. JVP not elevated. Lungs: Clear bilaterally to auscultation without wheezes, rales, or rhonchi. Breathing is unlabored. Heart: RRR S1 S2, soft SEM without rubs or gallops.  Abdomen: Soft, non-tender. G-tube in place along with abdominal binder Extremities: No clubbing or cyanosis. No edema. Heel protectors in place. Distal pedal pulses are 2+ and equal bilaterally. Neuro: Alert and oriented to self only, follows simple commands Psych: Flat affect   EKG:  The EKGs were personally reviewed and demonstrate: 09/10/19 NSR 90bpm, low voltage QRS, possible prior septal infarct, nonspecific ST-TW changes 09/20/20 SVT, long R-P possible atrial tachycardia, low voltage QRS, possible prior anteroseptal infarct, poor R wave progression Interim  EKGs in January 2021 show atrial flutter with variable conduction, also NSR 10/09/19 atrial flutter 2:1 at 141bpm, nonspecific STT changes  Telemetry:  Telemetry was personally reviewed and demonstrates:  NSR/SB 50s with brief junctional vs ectopic atrial rhythm, otherwise no recent afib/flutter  Relevant CV Studies: 2D echocardiogram 09/24/19 IMPRESSIONS  1. Left ventricular ejection fraction, by visual estimation, is 25 to 30%. The left ventricle has severely decreased function. There is no left ventricular hypertrophy.  2. Definity contrast agent was given IV to delineate the left ventricular endocardial borders.  3. Left ventricular diastolic parameters are consistent with Grade II diastolic dysfunction (pseudonormalization).  4. The left ventricle demonstrates regional wall motion abnormalities.  5. Global right ventricle has severely reduced systolic function.The right ventricular size is moderately enlarged. No increase in right ventricular wall thickness.  6. Left atrial size was normal.  7. Right atrial size was normal.  8. Large pleural effusion in the left lateral region.  9. The pericardial effusion is posterior to the left ventricle. 10. Trivial pericardial effusion is present. 11. The mitral valve is normal in structure. Trivial mitral valve regurgitation. No evidence of mitral stenosis. 12. The tricuspid valve is normal in structure. 13. The aortic valve is normal in structure. Aortic valve regurgitation is not visualized. No evidence of aortic valve sclerosis or stenosis. 14. The pulmonic valve was normal in structure. Pulmonic valve regurgitation is not visualized. 15. Normal pulmonary artery systolic pressure. 16. The tricuspid regurgitant velocity is 1.75 m/s, and with an assumed right atrial pressure of 15 mmHg, the estimated right ventricular systolic pressure is normal at 27.2 mmHg. 17. The inferior vena cava is normal in size with greater than 50% respiratory  variability,  suggesting right atrial pressure of 3 mmHg. 18. There is akinesis of the basal and mid anteroseptal, anterior, anterolateral walls and aneurysmal dilatation of the apical segments. There is a suspicion for an apical thrombus on the contrast images. Systemic anticoagulation is recommended. Overall  LVEF is severely reduced at 25-30%, RVEF is moderately reduced.  2D echo 2016 with bubble study - Left ventricle: The cavity size was normal. Wall thickness was   normal. Systolic function was normal. The estimated ejection   fraction was in the range of 55% to 60%. Wall motion was normal;   there were no regional wall motion abnormalities. Doppler   parameters are consistent with abnormal left ventricular   relaxation (grade 1 diastolic dysfunction).  Laboratory Data:  High Sensitivity Troponin:   Recent Labs  Lab 09/24/19 1034  TROPONINIHS 50*     Chemistry Recent Labs  Lab 10/16/19 0228 10/17/19 0248 10/19/19 0507  NA 138 139 139  K 4.7 4.9 4.5  CL 100 97* 100  CO2 28 32 29  GLUCOSE 146* 101* 141*  BUN 34* 37* 47*  CREATININE 0.62 0.59 0.80  CALCIUM 8.3* 8.8* 8.7*  GFRNONAA >60 >60 >60  GFRAA >60 >60 >60  ANIONGAP 10 10 10     Recent Labs  Lab 10/15/19 0220 10/16/19 0228 10/17/19 0248  PROT 5.9* 5.9* 6.8  ALBUMIN 2.4* 2.3* 2.7*  AST 18 24 32  ALT 21 21 27   ALKPHOS 77 72 80  BILITOT 0.6 0.6 0.8   Hematology Recent Labs  Lab 10/17/19 0248 10/18/19 0158 10/19/19 0507  WBC 14.8* 10.5 15.9*  RBC 3.32* 3.13* 2.49*  HGB 9.8* 9.0* 7.2*  HCT 32.3* 30.4* 24.6*  MCV 97.3 97.1 98.8  MCH 29.5 28.8 28.9  MCHC 30.3 29.6* 29.3*  RDW 15.2 15.0 15.3  PLT 282 265 260   BNPNo results for input(s): BNP, PROBNP in the last 168 hours.  DDimer No results for input(s): DDIMER in the last 168 hours.   Radiology/Studies:  CT HEAD WO CONTRAST  Result Date: 10/17/2019 CLINICAL DATA:  Altered mental status.  Dysphagia EXAM: CT HEAD WITHOUT CONTRAST TECHNIQUE: Contiguous axial  images were obtained from the base of the skull through the vertex without intravenous contrast. COMPARISON:  CT head 10/07/2019 FINDINGS: Brain: Moderate to advanced atrophy. Chronic microvascular ischemic changes in the white matter. Chronic ischemic change in the pons. Chronic infarct left thalamus. Chronic ischemic changes in the cerebellum bilaterally. Right parietal craniotomy. Parenchymal calcification at the craniotomy site is chronic and unchanged. Negative for acute infarct, hemorrhage, mass Vascular: Normal arterial flow voids. Skull: Right parietal craniotomy. Sinuses/Orbits: Mild mucosal edema paranasal sinuses. Bilateral mastoid effusion. Bilateral cataract extraction. Other: None IMPRESSION: Atrophy and extensive chronic ischemia. No acute abnormality and no change from the prior CT. Electronically Signed   By: Franchot Gallo M.D.   On: 10/17/2019 12:44     Assessment and Plan:   1. Acute hypoxic and hypercarbic respiratory failure in the setting of MRSA pneumonia, poor cough clearance, extreme debility, and multiple medical problems as outlined above - has received extensive care by medical, critical care and surgical teams. Palliative care was involved with consensus for continued aggressive medical care and full code. Per notes, plan is for patient to return home with extensive home health help.  2. 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 Weights extremely variable, peak 166, lowest 121, currently 129lb. I/O's +2L. Has been maintained on 40mg  IV Lasix daily. Appears euvolemic so  we will stop this as well as potassium. BP soft at times, prohibitive of aggressive medical therapy - need to be cognizant of hx of orthostasis. Per d/w Dr. Audie Box, try a trial of low dose carvedilol. Will discuss the issue of anticoagulation with MD. This is not a concretely easy decision given prior brain AVM/bleed in remote notes. She had not been felt to be a candidate for  anticoagulation earlier on in admission due to debility. She also had numerous procedures this admission. Repeat limited echo has been ordered by IM to reassess LVEF, will also helpfully elucidate the issue of potential LV thrombus. In absence of clear ischemic symptoms, do not anticipate invasive ischemic assessment. 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 We will plan to follow up in the outpatient setting as well.  3. Paroxysmal atrial flutter and SVT  - new diagnosis this admission. (Also reports of atrial fib earlier in admission, not documented on EKGs.) Currently maintaining NSR. Not anticoagulated, pending review with MD. CHADVASC 6 (CHF, age, stroke, cerebrovascular disease, female). Will also order thyroid function for AM.  4. Anemia - Hgb downtrending to 7.2 today. Further per primary team. Per nurse, patient is to receive a unit of blood today. Primary team feels no evidence of active bleeding, possibly procedurally-related. Needs close monitoring.  For questions or updates, please contact [place] Please consult [email or web address] for contact info under    Signed, Charlie Pitter, PA-C  10/19/2019 11:06 AM

## 2019-10-19 NOTE — Progress Notes (Signed)
Physical Therapy Hydrotherapy Treatment Note    10/19/19 1500  Subjective Assessment  Subjective Pt smiling end of session  Patient and Family Stated Goals none stated  Date of Onset  (present on admission)  Evaluation and Treatment  Evaluation and Treatment Procedures Explained to Patient/Family Yes  Evaluation and Treatment Procedures Patient unable to consent due to mental status  Pressure Injury 09/17/19 Sacrum Medial;Left Unstageable - Full thickness tissue loss in which the base of the injury is covered by slough (yellow, tan, gray, green or brown) and/or eschar (tan, brown or black) in the wound bed. wound w eschar  Date First Assessed/Time First Assessed: 09/17/19 0245   Location: Sacrum  Location Orientation: Medial;Left  Staging: Unstageable - Full thickness tissue loss in which the base of the injury is covered by slough (yellow, tan, gray, green or brown) an...  Dressing Type Gauze (Comment);Foam - Lift dressing to assess site every shift;Moist to dry (Santyl; barrier cream)  Dressing Changed (changed packing, reused foam outer dressing)  Dressing Change Frequency Twice a day  State of Healing Non-healing  Site / Wound Assessment Pink;Red;Yellow;Brown;Painful  % Wound base Red or Granulating 40%  % Wound base Yellow/Fibrinous Exudate 45%  % Wound base Black/Eschar 0%  % Wound base Other/Granulation Tissue (Comment) 15% (tan leathery appearance)  Peri-wound Assessment Maceration;Erythema (non-blanchable)  Margins Unattached edges (unapproximated)  Drainage Amount Minimal  Drainage Description Serosanguineous  Treatment Debridement (Selective);Hydrotherapy (Pulse lavage);Packing (Saline gauze) (santyl, barrier cream to close intact periwound)  Hydrotherapy  Pulsed lavage therapy - wound location sacrum  Pulsed Lavage with Suction (psi) 8 psi  Pulsed Lavage with Suction - Normal Saline Used 1000 mL  Pulsed Lavage Tip Tip with splash shield  Selective Debridement   Selective Debridement - Location sacrum  Selective Debridement - Tools Used Forceps;Scissors  Selective Debridement - Tissue Removed yellow slough, tan/light brown eschar   Wound Therapy - Assess/Plan/Recommendations  Wound Therapy - Clinical Statement 69 yo female admitted to hospital with weakness. Hx of CVA with L residual weakness, dementia, Parkinsons, sacral wounds, contractures. WC bound PTA.   Wound Therapy - Functional Problem List CVA with L residual weakness, contractures, Parkinson's  Factors Delaying/Impairing Wound Healing Incontinence;Immobility  Hydrotherapy Plan Debridement;Dressing change;Patient/family education;Pulsatile lavage with suction  Wound Therapy - Frequency 6X / week  Wound Therapy - Current Recommendations WOC nurse;Case manager/social work  Wound Therapy - Follow Up Recommendations Wound Care Center;Skilled nursing facility  Wound Plan Palliative following. Questionable Prognosis. Will plan to continue hydro, dressing changes. Will assess and update recommendations as needed.   Wound Therapy Goals - Improve the function of patient's integumentary system by progressing the wound(s) through the phases of wound healing by:  Decrease Necrotic Tissue to 30  Decrease Necrotic Tissue - Progress Progressing toward goal  Increase Granulation Tissue to 70  Increase Granulation Tissue - Progress Progressing toward goal  Goals/treatment plan/discharge plan were made with and agreed upon by patient/family No, Patient unable to participate in goals/treatment/discharge plan and family unavailable  Time For Goal Achievement 2 weeks  Wound Therapy - Potential for Goals Poor   Time: [place], DPT [place] Office: [phone]

## 2019-10-19 NOTE — Progress Notes (Addendum)
Nutrition Follow-up   DOCUMENTATION CODES:   Non-severe (moderate) malnutrition in context of chronic illness  INTERVENTION:  - will adjust TF regimen: Osmolite 1.5 @ 50 ml/hr to advance by 10 ml every 4 hours to reach goal rate of 50 ml/hr with 30 ml prostat (or equivalent) once/day and 100 ml free water every 4 hours.  - at goal rate, this regimen will provide 2000 kcal, 105 grams protein, and 1514 ml free water. - recommend maintaining continuous TF at home in the short term, and for patient to transition to bolus in the future, if outpatient RD determines patient is appropriate for such.     NUTRITION DIAGNOSIS:   Moderate Malnutrition related to chronic illness(wheelchair bound x3 years following CVA) as evidenced by mild fat depletion, mild muscle depletion. -ongoing  GOAL:   Patient will meet greater than or equal to 90% of their needs -unmet with current TF rate  MONITOR:   TF tolerance, Labs, Weight trends, Skin  REASON FOR ASSESSMENT:   Consult Enteral/tube feeding initiation and management  ASSESSMENT:   69 year-old female with medical history of breast cancer s/p bone marrow transplant early 1990s, problems with sundowning/dementia, and wheelchair bound x3 years following CVA. She presented to the ED due to 1 week of worsening cough and congestion. She was also experiencing poor oral intake, AMS, and increased weakness.  Significant Events: 12/26- admission 12/29- NGT placement; TF initiation 1/3- intubation 1/14- extubation 1/15- transferred from 2 [place] to [place] and then to [place] 1/18- transferred back to 2 [place] after Rapid Response over night 1/22- NGT placed in R nare and TF initiated  1/27- surgically placed G-tube   Patient noted to be a/o to self per flow sheet documentation. She is currently sleeping with no family/visitors present. Patient had been seen by Psychiatry earlier in the week and determined not to be competent so G-tube placed per family  desire yesterday.    She is currently receiving Vital High Protein @ 20 ml/hr which is providing 480 kcal, 42 grams protein, and 401 ml free water. Will order TF as outlined above to better meet needs. Patient remains (and plan to remain) NPO.   Weight has been stable 1/16-1/24 and then began trending down. Weight today is consistent with previous trend. Estimated needs remain appropriate.  Per notes: - Surgery signing off today - acute metabolic encephalopathy with hx of underlying dementia - hypokalemia--resolved s/p repletion - stage 4 sacral pressure injury - refeeding risk with plan to continue monitoring K and Phos - tentative d/c home on 1/29 if she remains stable    Labs reviewed; CBGs: 121 and 112 mg/dl, BUN: 47 mg/dl, Ca: 8.7 mg/dl. Medications reviewed; 40 mg IV lasix/day, 20 mEq KCl per tube/day, 5 ml senokot per tube BID.     Diet Order:   Diet Order            Diet NPO time specified Except for: Other (See Comments)  Diet effective now              EDUCATION NEEDS:   Not appropriate for education at this time  Skin:  Skin Assessment: Skin Integrity Issues: Skin Integrity Issues:: Stage I, Stage II, Unstageable, Incisions Stage I: upper L back Stage II: L thigh and R ear Stage III: sacrum Unstageable: full thickness to sacrum Incisions: abdomen (1/28--G-tube) Other: venous stasis ulcer to L thigh  Last BM:  1/23  Height:   Ht Readings from Last 1 Encounters:  09/25/19  5\' 8"  (1.727 m)    Weight:   Wt Readings from Last 1 Encounters:  10/19/19 58.7 kg    Ideal Body Weight:  63.6 kg  BMI:  Body mass index is 19.68 kg/m.  Estimated Nutritional Needs:   Kcal:  2000-2200 kcal  Protein:  100-115 grams  Fluid:  >/= 2 L/day     Jarome Matin, MS, RD, LDN, [facility] Inpatient Clinical Dietitian Pager # [phone] After hours/weekend pager # [phone]

## 2019-10-19 NOTE — Progress Notes (Signed)
Attempted echo twice. Will try again as soon as schedule permits.

## 2019-10-19 NOTE — Progress Notes (Signed)
PROGRESS NOTE    Barbara Flowers  [id number] DOB: 1951-02-23 DOA: 09/16/2019 PCP: [name], NP    Brief Narrative:  Patient is here for 32 days. Previous events and treatments events are based upon previous physicians notes and records and are copied forward.  69 year old female with history of breast cancer, dementia, history of CVA with left-sided hemiplegia, from home, wheelchair-bound for last 3 years presented to the emergency room on 09/16/2019 with 1 week of cough, congestion, worsening shortness of breath.  Initially patient was found with extensive left-sided infiltrate and left-sided effusion with hypoxemia corrected with nonrebreather and admitted to ICU.  Significant Events as below: 12/28 thoracentesis, 1/2 thoracentesis 1/3 intubated and transferred to ICU 1/8 started vanc for MRSA sputum 1/14 acceptable rapid shallow. Following commands, extubated.   1/15 looked better ready for floor. PCCM signed off.  1/18 Rapid response overnight, suspected ongoing aspiration, copious thick secretions, poor mentation. PCCM re-engaged , back on vancomycin and Unasyn and treated for 7 days with vanco and unasyn.  1/22 patient is clinically improving, remains with aspiration risk. 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 -Surgery consulted for surgical G-tube placement , surgical G-tube placed on 1/27. -Tube feeding is started today, 1/28. -Multiple episodes of paroxysmal atrial flutter, ejection fraction 25% thought to be from sepsis.  Antibiotics: Rocephin 12/26 >> (14 day course) 1/8 Azithromycin 12/27 > 28 Vancomycin 12/26, 1/7-1/12, 1/18 > 1/24 Unasyn 1/18 > 1/24  Diagnostic data: Covid 19 PCR 12/26 >> neg RVP 12/26 >>neg  Procalcitonin 12/26 >> 2.01, down trended to 0.4 with abx BCx2 12/26 >>Pan sensitive Proteus Mirabilis UC 12/26 >> negative Urine Pneum Ag 12/26 >> positive Respiratory  culture 1/6>> MRSA  Assessment & Plan:   Principal Problem:   CAP (community acquired pneumonia) Active Problems:   History of CVA with residual deficit left side   Dementia (HCC)   Pleural effusion on left   Acute prerenal azotemia   Hypernatremia   Pressure injury of skin   Protein-calorie malnutrition, severe (HCC)   Acute hypoxemic respiratory failure (HCC)   Pleural effusion   Acute systolic heart failure (HCC)   Failure to thrive in adult   Palliative care by specialist   Goals of care, counseling/discussion   Generalized weakness   Wheelchair dependent 2017-   Memory difficulty   Parkinson's disease ([place])   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 Resolved.  On room air now.  Acute metabolic encephalopathy with history of underlying dementia: CT head negative 1/16.  Mentation fluctuates. With history of previous strokes, contractures and debility.  Repeat CT scan with extensive chronic ischemia.  No new findings.  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 fib with RVR: Previous ejection fraction 55%.  EF 25 to 30%.  Intermittent A. Fib/flutter.  Currently in sinus rhythm.   Repeat limited echocardiogram today to look for ejection fraction. Patient on scheduled diuretics and euvolemic today. We will consult cardiology for help optimizing treatment if any. Not started on any anticoagulation because of extreme  debility.  Hypokalemia: Replaced.  On a schedule replacement with Lasix. Continue close monitoring.  Adequately  replaced.  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  Seen by surgery.  Bedside debridement.  PT doing hydrotherapy.  Patient's husband thinks they can take care of it at home with wound care.  Dysphagia: Status post PEG tube placement.  Keep n.p.o.  Will need speech therapy follow-up.  Risk of refeeding syndrome.  Continue monitoring magnesium and phosphorus tomorrow.  Anemia: Acute on chronic anemia from expected blood loss and multiple procedures.  Hemoglobin is 7.2.  No active bleeding.  However, given severe cardiomyopathy, will transfuse 1 unit of PRBC.  Husband consented over the phone.  Disposition: Discussed with husband.  He wants to take her home.  Tentative discharge tomorrow if she remains a stable. She will be needing very high level of care at home and that patient's husband and her daughter are ready to provide. Patient will also need wound care, at least 5 times a week. PT OT speech and nursing.  DVT prophylaxis: Heparin subcu Code Status: Full code Family Communication: Patient's husband on the phone. Disposition Plan: from home .  Clinically stabilizing though very high risk of readmission.  Anticipate discharge home with arrangements if able to make at home by tomorrow.  Consultants:   PCCM,  Surgery,  Palliative medicine  Cardiology  Procedures:   Bedside wound debridement, 1/19  Antimicrobials:  Finished multiple rounds of antibiotics.   Subjective: no Overnight events.  Patient was sleepy in the morning.  Objective: Vitals:   10/18/19 2316 10/19/19 0000 10/19/19 0400 10/19/19 0800  BP:  (!) 104/43 (!) 126/56 102/77  Pulse:  (!) 58 61 (!) 58  Resp:  (!) 0 12 10  Temp: 97.9 F (36.6 C)   (!) 97.4 F (36.3 C)  TempSrc: Axillary   Axillary  SpO2:  99% 98% 99%  Weight:   58.7 kg   Height:        Intake/Output Summary (Last 24 hours) at 10/19/2019 1004 Last data filed at 10/19/2019 0630 Gross per 24 hour  Intake  2562.58 ml  Output 800 ml  Net 1762.58 ml   Filed Weights   10/17/19 0500 10/18/19 0500 10/19/19 0400  Weight: 55 kg 55.5 kg 58.7 kg    Examination:  Physical Exam  Constitutional:  Sick looking, flat affect, debilitated, not much interaction.  HENT:  Head: Atraumatic.  Eyes: Pupils are equal, round, and reactive to light.  Cardiovascular: Normal rate and regular rhythm.  Respiratory:  On room air.  Some conducted airway sounds.  GI: Bowel sounds are normal.  G-tube in place.  Intact and dry dressing.  Musculoskeletal:     Cervical back: Normal range of motion.     Comments: Bilateral lower leg contractures, not moving. Right upper extremity normal power. Left upper extremity contracture with minimal movement.  Neurological: She is alert.  Flat affect.  Oriented x1 or 2.  She has fluctuating mentation.    Skin:  Stage IV decubital ulcer left sacrum with dressing.      Data Reviewed: I have personally reviewed following labs and imaging studies  CBC: Recent Labs  Lab 10/15/19 0220 10/16/19 0228 10/17/19 0248 10/18/19 0158 10/19/19 0507  WBC 14.4* 14.2* 14.8* 10.5 15.9*  NEUTROABS 10.3* 10.3* 10.9* 7.0 13.5*  HGB 8.8* 8.7* 9.8* 9.0* 7.2*  HCT 29.5* 29.4* 32.3* 30.4* 24.6*  MCV 96.1 97.0 97.3 97.1 98.8  PLT 311 269 282 265  [id number]   Basic Metabolic Panel: Recent Labs  Lab 10/13/19 0149 10/13/19 0149 10/14/19 0706 10/15/19 0220 10/16/19 0228 10/17/19 0248 10/19/19 0507  NA 142   < > 140 139 138 139 139  K 4.1   < > 4.2 4.7 4.7 4.9 4.5  CL 105   < > 101 102 100 97* 100  CO2 28   < > 31 28 28  32 29  GLUCOSE 157*   < > 127* 130* 146* 101* 141*  BUN 22   < > 22 29* 34* 37* 47*  CREATININE 0.49   < > 0.50 0.54 0.62 0.59 0.80  CALCIUM 8.0*   < > 8.1* 8.2* 8.3* 8.8* 8.7*  MG 2.3  --  2.0 2.1  --   --  2.2  PHOS 4.1  --  3.0 3.5  --   --  4.2   < > = values in this interval not displayed.   GFR: Estimated Creatinine Clearance: 61.5 mL/min (by C-G formula  based on SCr of 0.8 mg/dL). Liver Function Tests: Recent Labs  Lab 10/13/19 0149 10/14/19 0706 10/15/19 0220 10/16/19 0228 10/17/19 0248  AST 24 16 18 24  32  ALT 28 22 21 21 27   ALKPHOS 76 82 77 72 80  BILITOT 0.6 0.5 0.6 0.6 0.8  PROT 5.8* 5.9* 5.9* 5.9* 6.8  ALBUMIN 2.2* 2.4* 2.4* 2.3* 2.7*   No results for input(s): LIPASE, AMYLASE in the last 168 hours. No results for input(s): AMMONIA in the last 168 hours. Coagulation Profile: No results for input(s): INR, PROTIME in the last 168 hours. Cardiac Enzymes: No results for input(s): CKTOTAL, CKMB, CKMBINDEX, TROPONINI in the last 168 hours. BNP (last 3 results) No results for input(s): PROBNP in the last 8760 hours. HbA1C: No results for input(s): HGBA1C in the last 72 hours. CBG: Recent Labs  Lab 10/18/19 0734 10/18/19 0806 10/18/19 1933 10/19/19 0507 10/19/19 0745  GLUCAP 76 81 130* 121* 112*   Lipid Profile: No results for input(s): CHOL, HDL, LDLCALC, TRIG, CHOLHDL, LDLDIRECT in the last 72 hours. Thyroid Function Tests: No results for input(s): TSH, T4TOTAL, FREET4, T3FREE, THYROIDAB in the last 72 hours. Anemia Panel: No results for input(s): VITAMINB12, FOLATE, FERRITIN, TIBC, IRON, RETICCTPCT in the last 72 hours. Sepsis Labs: No results for input(s): PROCALCITON, LATICACIDVEN in the last 168 hours.  No results found for this or any previous visit (from the past 240 hour(s)).       Radiology Studies: CT HEAD WO CONTRAST  Result Date: 10/17/2019 CLINICAL DATA:  Altered mental status.  Dysphagia EXAM: CT HEAD WITHOUT CONTRAST TECHNIQUE: Contiguous axial images were obtained from the base of the skull through the vertex without intravenous contrast. COMPARISON:  CT head 10/07/2019 FINDINGS: Brain: Moderate to advanced atrophy. Chronic microvascular ischemic changes in the white matter. Chronic ischemic change in the pons. Chronic infarct left thalamus. Chronic ischemic changes in the cerebellum bilaterally.  Right parietal craniotomy. Parenchymal calcification at the craniotomy site is chronic and unchanged. Negative for acute infarct, hemorrhage, mass Vascular: Normal arterial flow voids. Skull: Right parietal craniotomy. Sinuses/Orbits: Mild mucosal edema paranasal sinuses. Bilateral mastoid effusion. Bilateral cataract extraction. Other: None IMPRESSION: Atrophy and extensive chronic ischemia. No acute abnormality and no change from the prior CT. Electronically Signed   By: Franchot Gallo M.D.   On: 10/17/2019 12:44        Scheduled Meds: . sodium chloride   Intravenous Once  . chlorhexidine  15 mL Mouth Rinse BID  .  Chlorhexidine Gluconate Cloth  6 each Topical Daily  . collagenase   Topical Daily  . donepezil  10 mg Per Tube QHS  . feeding supplement (PRO-STAT SUGAR FREE 64)  30 mL Per Tube BID  . feeding supplement (VITAL HIGH PROTEIN)  1,000 mL Per Tube Q24H  . furosemide  40 mg Intravenous Daily  . Gerhardt's butt cream   Topical BID  . heparin  5,000 Units Subcutaneous Q8H  . mouth rinse  15 mL Mouth Rinse q12n4p  . potassium chloride  20 mEq Oral Daily  . sennosides  5 mL Per Tube BID   Continuous Infusions: . sodium chloride Stopped (10/16/19 1900)     LOS: 33 days    Time spent: 25 minutes     [name], MD [facility] Pager [phone]

## 2019-10-19 NOTE — Plan of Care (Signed)
Called by the cardiology team regarding this patient at [facility], for opinion on anticoagulation given her neurological history. 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  Patients with prior ICH is, are always at high risk for future bleeds but in her case given that she needs anticoagulation for the LV thrombus and atrial flutter/fibrillation, benefits of anticoagulation to outweigh the risks.   I reviewed the CT head done on this admission, no evidence of acute bleed.  No evidence of evolving acute infarct.  Extensive chronic white matter disease.  From a neurological standpoint, she can be anticoagulated.  Please call neurology with questions  -- Barbara Portland, MD [facility] Pager: [phone] If 7pm to 7am, please call on call as listed on AMION.

## 2019-10-20 LAB — CBC WITH DIFFERENTIAL/PLATELET
Abs Immature Granulocytes: 0.07 10*3/uL (ref 0.00–0.07)
Basophils Absolute: 0 10*3/uL (ref 0.0–0.1)
Basophils Relative: 0 %
Eosinophils Absolute: 0 10*3/uL (ref 0.0–0.5)
Eosinophils Relative: 0 %
HCT: 26.1 % — ABNORMAL LOW (ref 36.0–46.0)
Hemoglobin: 8 g/dL — ABNORMAL LOW (ref 12.0–15.0)
Immature Granulocytes: 1 %
Lymphocytes Relative: 16 %
Lymphs Abs: 1.9 10*3/uL (ref 0.7–4.0)
MCH: 29.2 pg (ref 26.0–34.0)
MCHC: 30.7 g/dL (ref 30.0–36.0)
MCV: 95.3 fL (ref 80.0–100.0)
Monocytes Absolute: 1.5 10*3/uL — ABNORMAL HIGH (ref 0.1–1.0)
Monocytes Relative: 12 %
Neutro Abs: 8.5 10*3/uL — ABNORMAL HIGH (ref 1.7–7.7)
Neutrophils Relative %: 71 %
Platelets: 216 10*3/uL (ref 150–400)
RBC: 2.74 MIL/uL — ABNORMAL LOW (ref 3.87–5.11)
RDW: 16.1 % — ABNORMAL HIGH (ref 11.5–15.5)
WBC: 12 10*3/uL — ABNORMAL HIGH (ref 4.0–10.5)
nRBC: 0 % (ref 0.0–0.2)

## 2019-10-20 LAB — BASIC METABOLIC PANEL
Anion gap: 10 (ref 5–15)
BUN: 43 mg/dL — ABNORMAL HIGH (ref 8–23)
CO2: 28 mmol/L (ref 22–32)
Calcium: 8.2 mg/dL — ABNORMAL LOW (ref 8.9–10.3)
Chloride: 101 mmol/L (ref 98–111)
Creatinine, Ser: 0.57 mg/dL (ref 0.44–1.00)
GFR calc Af Amer: >60 mL/min (ref >60)
GFR calc non Af Amer: >60 mL/min (ref >60)
Glucose, Bld: 120 mg/dL — ABNORMAL HIGH (ref 70–99)
Potassium: 4 mmol/L (ref 3.5–5.1)
Sodium: 139 mmol/L (ref 135–145)

## 2019-10-20 LAB — BPAM RBC
Blood Product Expiration Date: 202102132359
ISSUE DATE / TIME: 202101281705
Unit Type and Rh: 6200

## 2019-10-20 LAB — PHOSPHORUS: Phosphorus: 2.5 mg/dL (ref 2.5–4.6)

## 2019-10-20 LAB — GLUCOSE, CAPILLARY
Glucose-Capillary: 142 mg/dL — ABNORMAL HIGH (ref 70–99)
Glucose-Capillary: 122 mg/dL — ABNORMAL HIGH (ref 70–99)
Glucose-Capillary: 107 mg/dL — ABNORMAL HIGH (ref 70–99)
Glucose-Capillary: 99 mg/dL (ref 70–99)
Glucose-Capillary: 84 mg/dL (ref 70–99)
Glucose-Capillary: 107 mg/dL — ABNORMAL HIGH (ref 70–99)

## 2019-10-20 LAB — TYPE AND SCREEN
ABO/RH(D): A POS
Antibody Screen: NEGATIVE
Unit division: 0

## 2019-10-20 LAB — MAGNESIUM: Magnesium: 1.9 mg/dL (ref 1.7–2.4)

## 2019-10-20 LAB — TSH: TSH: 3.81 u[IU]/mL (ref 0.350–4.500)

## 2019-10-20 LAB — T4, FREE: Free T4: 0.81 ng/dL (ref 0.61–1.12)

## 2019-10-20 MED ORDER — SODIUM CHLORIDE 0.9 % IV BOLUS
500.0000 mL | Freq: Once | INTRAVENOUS | Status: AC
  Administered 2019-10-20: 500 mL via INTRAVENOUS

## 2019-10-20 NOTE — Progress Notes (Addendum)
Patient 04:00 am blood pressure was 72/27 map of 39. MD was notified. 500 cc bolus was given

## 2019-10-20 NOTE — Progress Notes (Signed)
Per Dr. Kathalene Frames request, f/u scheduled 11/17/19 - appt placed on AVS.

## 2019-10-20 NOTE — Progress Notes (Signed)
Physical Therapy Hydrotherapy Treatment Note  Per chart review, possible d/c home tomorrow.  Pt does not need hydrotherapy prior to d/c if leaving tomorrow however if remains in hospital over weekend, will check back on Monday.   10/20/19 1600  Subjective Assessment  Subjective Pt smiling again today end of session  Patient and Family Stated Goals none stated  Date of Onset  (present on admission)  Evaluation and Treatment  Evaluation and Treatment Procedures Explained to Patient/Family Yes  Evaluation and Treatment Procedures Patient unable to consent due to mental status  Pressure Injury 09/17/19 Sacrum Medial;Left Unstageable - Full thickness tissue loss in which the base of the injury is covered by slough (yellow, tan, gray, green or brown) and/or eschar (tan, brown or black) in the wound bed. wound w eschar  Date First Assessed/Time First Assessed: 09/17/19 0245   Location: Sacrum  Location Orientation: Medial;Left  Staging: Unstageable - Full thickness tissue loss in which the base of the injury is covered by slough (yellow, tan, gray, green or brown) an...  Dressing Type Gauze (Comment);Foam - Lift dressing to assess site every shift;Moist to dry (Santyl; barrier cream)  Dressing Changed  Dressing Change Frequency Twice a day  State of Healing Non-healing  Site / Wound Assessment Pink;Red;Yellow;Brown;Painful  % Wound base Red or Granulating 45%  % Wound base Yellow/Fibrinous Exudate 45%  % Wound base Black/Eschar 0%  % Wound base Other/Granulation Tissue (Comment) 10% (tan leathery appearance)  Peri-wound Assessment Erythema (non-blanchable);Pink  Margins Unattached edges (unapproximated)  Drainage Amount Moderate  Drainage Description Serosanguineous  Treatment Packing (Saline gauze);Hydrotherapy (Pulse lavage);Debridement (Selective) (santyl)  Hydrotherapy  Pulsed lavage therapy - wound location sacrum  Pulsed Lavage with Suction (psi) 8 psi  Pulsed Lavage with Suction -  Normal Saline Used 1000 mL  Pulsed Lavage Tip Tip with splash shield  Selective Debridement  Selective Debridement - Location sacrum  Selective Debridement - Tools Used Forceps;Scissors  Selective Debridement - Tissue Removed yellow slough, tan/light brown eschar   Wound Therapy - Assess/Plan/Recommendations  Wound Therapy - Clinical Statement 69 yo female admitted to hospital with weakness. Hx of CVA with L residual weakness, dementia, Parkinsons, sacral wounds, contractures. WC bound PTA.   Wound Therapy - Functional Problem List CVA with L residual weakness, contractures, Parkinson's  Factors Delaying/Impairing Wound Healing Incontinence;Immobility  Hydrotherapy Plan Debridement;Dressing change;Patient/family education;Pulsatile lavage with suction  Wound Therapy - Frequency 6X / week  Wound Therapy - Current Recommendations WOC nurse;Case manager/social work  Wound Therapy - Follow Up Recommendations Wound Care Center;Skilled nursing facility  Wound Plan Palliative following. Questionable Prognosis. Will plan to continue hydro, dressing changes. Will assess and update recommendations as needed.   Wound Therapy Goals - Improve the function of patient's integumentary system by progressing the wound(s) through the phases of wound healing by:  Decrease Necrotic Tissue to 30  Decrease Necrotic Tissue - Progress Progressing toward goal  Increase Granulation Tissue to 70  Increase Granulation Tissue - Progress Progressing toward goal  Goals/treatment plan/discharge plan were made with and agreed upon by patient/family No, Patient unable to participate in goals/treatment/discharge plan and family unavailable  Time For Goal Achievement 2 weeks  Wound Therapy - Potential for Goals Poor   Time: [id number] - [place], DPT [place] Office: [phone]

## 2019-10-20 NOTE — Progress Notes (Signed)
Progress Note  Patient Name: Barbara Flowers Date of Encounter: 10/20/2019  Primary Cardiologist: Evalina Field, MD  Subjective   Denies CP, SOB. Minimally interactive.  Inpatient Medications    Scheduled Meds: . chlorhexidine  15 mL Mouth Rinse BID  . Chlorhexidine Gluconate Cloth  6 each Topical Daily  . collagenase   Topical Daily  . donepezil  10 mg Per Tube QHS  . feeding supplement (PRO-STAT SUGAR FREE 64)  30 mL Per Tube Daily  . free water  100 mL Per Tube Q4H  . Gerhardt's butt cream   Topical BID  . heparin  5,000 Units Subcutaneous Q8H  . mouth rinse  15 mL Mouth Rinse q12n4p  . sennosides  5 mL Per Tube BID   Continuous Infusions: . sodium chloride Stopped (10/16/19 1900)  . feeding supplement (OSMOLITE 1.5 CAL) 50 mL/hr at 10/20/19 0600   PRN Meds: sodium chloride, acetaminophen (TYLENOL) oral liquid 160 mg/5 mL, albuterol, dextrose, docusate, labetalol   Vital Signs    Vitals:   10/20/19 0203 10/20/19 0336 10/20/19 0610 10/20/19 0800  BP:   (!) 84/48 (!) 97/47  Pulse:   67 71  Resp:   16 15  Temp:  (!) 97.2 F (36.2 C)    TempSrc:  Axillary    SpO2:   100% 98%  Weight: 58.7 kg     Height:        Intake/Output Summary (Last 24 hours) at 10/20/2019 0824 Last data filed at 10/20/2019 0600 Gross per 24 hour  Intake 1669.46 ml  Output 1800 ml  Net -130.54 ml   Last 3 Weights 10/20/2019 10/19/2019 10/18/2019  Weight (lbs) 129 lb 6.6 oz 129 lb 6.6 oz 122 lb 5.7 oz  Weight (kg) 58.7 kg 58.7 kg 55.5 kg     Telemetry    NSR - Personally Reviewed  Physical Exam   GEN: No acute distress, chronically ill appearing HEENT: Normocephalic, atraumatic, sclera non-icteric. Neck: No JVD or bruits. Cardiac: RRR no murmurs, rubs, or gallops.  Radials/DP/PT 1+ and equal bilaterally.  Respiratory: Clear to auscultation bilaterally. Breathing is unlabored. GI: Soft, nontender, abdominal binder present, bowel sounds present MS: no deformity. Extremities: No  clubbing or cyanosis. No edema. Distal pedal pulses are 2+ and equal bilaterally. Neuro:  Alert and oriented to self only, follows basic commands with coaching, contracted posture Psych: Flat affect  Labs    High Sensitivity Troponin:   Recent Labs  Lab 09/24/19 1034  TROPONINIHS 50*      Cardiac EnzymesNo results for input(s): TROPONINI in the last 168 hours. No results for input(s): TROPIPOC in the last 168 hours.   Chemistry Recent Labs  Lab 10/15/19 0220 10/15/19 0220 10/16/19 0228 10/16/19 0228 10/17/19 0248 10/19/19 0507 10/20/19 0231  NA 139   < > 138   < > 139 139 139  K 4.7   < > 4.7   < > 4.9 4.5 4.0  CL 102   < > 100   < > 97* 100 101  CO2 28   < > 28   < > 32 29 28  GLUCOSE 130*   < > 146*   < > 101* 141* 120*  BUN 29*   < > 34*   < > 37* 47* 43*  CREATININE 0.54   < > 0.62   < > 0.59 0.80 0.57  CALCIUM 8.2*   < > 8.3*   < > 8.8* 8.7* 8.2*  PROT 5.9*  --  5.9*  --  6.8  --   --   ALBUMIN 2.4*  --  2.3*  --  2.7*  --   --   AST 18  --  24  --  32  --   --   ALT 21  --  21  --  27  --   --   ALKPHOS 77  --  72  --  80  --   --   BILITOT 0.6  --  0.6  --  0.8  --   --   GFRNONAA >60   < > >60   < > >60 >60 >60  GFRAA >60   < > >60   < > >60 >60 >60  ANIONGAP 9   < > 10   < > 10 10 10    < > = values in this interval not displayed.     Hematology Recent Labs  Lab 10/18/19 0158 10/18/19 0158 10/19/19 0507 10/19/19 2205 10/20/19 0231  WBC 10.5  --  15.9*  --  12.0*  RBC 3.13*  --  2.49*  --  2.74*  HGB 9.0*   < > 7.2* 8.3* 8.0*  HCT 30.4*   < > 24.6* 26.2* 26.1*  MCV 97.1  --  98.8  --  95.3  MCH 28.8  --  28.9  --  29.2  MCHC 29.6*  --  29.3*  --  30.7  RDW 15.0  --  15.3  --  16.1*  PLT 265  --  260  --  216   < > = values in this interval not displayed.    BNPNo results for input(s): BNP, PROBNP in the last 168 hours.   DDimer No results for input(s): DDIMER in the last 168 hours.   Radiology    ECHOCARDIOGRAM LIMITED  Result Date:  10/19/2019   ECHOCARDIOGRAM LIMITED REPORT   Patient Name:   Barbara Flowers Date of Exam: 10/19/2019 Medical Rec #:  [id number]      Height:       68.0 in Accession #:    [id number]     Weight:       129.4 lb Date of Birth:  01-Jul-1951      BSA:          1.70 m Patient Age:    69 years       BP:           102/77 mmHg Patient Gender: F              HR:           74 bpm. Exam Location:  Inpatient  Procedure: Limited Echo and Intracardiac Opacification Agent Indications:    Cardiomyopathy-Unspecified 425.9  History:        Patient has prior history of Echocardiogram examinations, most                 recent 09/24/2019. Pleural effusion.  Sonographer:    Paulita Fujita RDCS Referring Phys: [id number] [place]  1. Left ventricular ejection fraction, by visual estimation, is 45 to 50%. Left ventricular septal wall thickness was normal. Normal left ventricular posterior wall thickness. There is no increased left ventricular wall thickness.  2. Left ventricular diastolic function could not be evaluated.  3. Right ventricular volume/pressure overload.  4. The left ventricle demonstrates regional wall motion abnormalities.  5. Apical akinesis. Systolic function has improved from 25-30% 09/24/19.  6. The mitral valve is grossly normal.  7. The tricuspid valve was not assessed. FINDINGS  Left Ventricle: Left ventricular ejection fraction, by visual estimation, is 45 to 50%. The left ventricle demonstrates regional wall motion abnormalities. Left ventricular septal wall thickness was normal. Normal left ventricular posterior wall thickness. There is no increased left ventricular wall thickness. Abnormal (paradoxical) septal motion, consistent with right ventricular volume overload and/or elevated RV end-diastolic pressure. Left ventricular diastolic function could not be evaluated. Apical akinesis. Systolic function has improved from 25-30% 09/24/19. Left Atrium: Left atrial size was normal in size. Right Atrium: Right  atrial size was normal in size. Mitral Valve: The mitral valve is grossly normal. Tricuspid Valve: The tricuspid valve is not assessed. Aortic Valve: The aortic valve is grossly normal. Pulmonic Valve: The pulmonic valve was not assessed. Aorta: The aortic root, ascending aorta, aortic arch and descending aorta are all structurally normal, with no evidence of dilitation or obstruction.  Skeet Latch MD Electronically signed by Skeet Latch MD Signature Date/Time: 10/19/2019/6:50:12 PMThe mitral valve is grossly normal.    Final     Cardiac Studies   2D echocardiogram 09/24/19 IMPRESSIONS 1. Left ventricular ejection fraction, by visual estimation, is 25 to 30%. The left ventricle has severely decreased function. There is no left ventricular hypertrophy. 2. Definity contrast agent was given IV to delineate the left ventricular endocardial borders. 3. Left ventricular diastolic parameters are consistent with Grade II diastolic dysfunction (pseudonormalization). 4. The left ventricle demonstrates regional wall motion abnormalities. 5. Global right ventricle has severely reduced systolic function.The right ventricular size is moderately enlarged. No increase in right ventricular wall thickness. 6. Left atrial size was normal. 7. Right atrial size was normal. 8. Large pleural effusion in the left lateral region. 9. The pericardial effusion is posterior to the left ventricle. 10. Trivial pericardial effusion is present. 11. The mitral valve is normal in structure. Trivial mitral valve regurgitation. No evidence of mitral stenosis. 12. The tricuspid valve is normal in structure. 13. The aortic valve is normal in structure. Aortic valve regurgitation is not visualized. No evidence of aortic valve sclerosis or stenosis. 14. The pulmonic valve was normal in structure. Pulmonic valve regurgitation is not visualized. 15. Normal pulmonary artery systolic pressure. 16. The tricuspid regurgitant  velocity is 1.75 m/s, and with an assumed right atrial pressure of 15 mmHg, the estimated right ventricular systolic pressure is normal at 27.2 mmHg. 17. The inferior vena cava is normal in size with greater than 50% respiratory variability, suggesting right atrial pressure of 3 mmHg. 18. There is akinesis of the basal and mid anteroseptal, anterior, anterolateral walls and aneurysmal dilatation of the apical segments. There is a suspicion for an apical thrombus on the contrast images. Systemic anticoagulation is recommended. Overall  LVEF is severely reduced at 25-30%, RVEF is moderately reduced.   Limited Echo 10/19/19  1. Left ventricular ejection fraction, by visual estimation, is 45 to 50%. Left ventricular septal wall thickness was normal. Normal left ventricular posterior wall thickness. There is no increased left ventricular wall thickness.  2. Left ventricular diastolic function could not be evaluated.  3. Right ventricular volume/pressure overload.  4. The left ventricle demonstrates regional wall motion abnormalities.  5. Apical akinesis. Systolic function has improved from 25-30% 09/24/19.  6. The mitral valve is grossly normal.  7. The tricuspid valve was not assessed.  2D echo 2016 with bubble study - Left ventricle: The cavity size was normal. Wall thickness was normal. Systolic function was normal. The estimated ejection fraction was in the  range of 55% to 60%. Wall motion was normal; there were no regional wall motion abnormalities. Doppler parameters are consistent with abnormal left ventricular relaxation (grade 1 diastolic dysfunction).    Patient Profile     69 y.o. 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  Assessment & Plan    1. Acute hypoxic and hypercarbic respiratory failure in the setting of MRSA pneumonia, poor cough clearance, extreme debility, and multiple medical problems as outlined above - has received extensive care by medical, critical care and surgical teams. Palliative care was involved with consensus for continued aggressive medical care and full code. Per notes, plan is for patient to return home with extensive home health help.  2. 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 EF 25-30% on 09/24/19 with possible LV thrombus. Repeat echo yesterday showed EF 45-50%, suggesting likely stress cardiomyopathy related to acute illness. This repeat study showed apical akinesis but did not comment on presence or absence of LV thrombus. We discussed clinical situation with neurology yesterday given her brain/surgical history. They felt that if anticoagulation was desired, there is increased risk of intracranial bleeding but that risk would not be prohibitive, and could be started. She also has an indication with atrial fibrillation. Will review plan with MD. In absence of clear ischemic symptoms, poor functional state, and numerous comorbidities, do not anticipate invasive ischemic assessment. She was trialed on very low dose carvedilol yesterday but dropped her pressures in to the 70s, requiring fluid bolus. She also has extensive history of orthostasis requiring midodrine, therefore unlikely to tolerate GDMT. Lasix stopped yesterday, can use 20mg  PRN going forward.  3. Paroxysmal atrial flutter and SVT  - new diagnosis this admission. (Also reports of atrial fib earlier in admission, not documented on EKGs.) Currently maintaining  NSR. Not anticoagulated, pending review with MD. CHADVASC 6 (CHF, age, stroke, cerebrovascular disease, female).  4. Anemia - Hgb nadir 7.2 yesterday, s/p I U PRBC. F/u Hgb 8.3 then 8.0. Further per primary team. Primary team feels no evidence of active bleeding, possibly procedurally-related. Needs close monitoring, especially with consideration of anticoagulation on board.  For questions or updates, please contact [place] Please consult [email or web address] for contact info under Cardiology/STEMI.  Signed, Charlie Pitter, PA-C 10/20/2019, 8:24 AM

## 2019-10-20 NOTE — Progress Notes (Signed)
PROGRESS NOTE    Barbara Flowers  [id number] DOB: 14-Sep-1951 DOA: 09/16/2019 PCP: [name], NP    Brief Narrative:  Patient is here for 32 days. Previous events and treatments events are based upon previous physicians notes and records and are copied forward.  69 year old female with history of breast cancer, dementia, history of CVA with left-sided hemiplegia, from home, wheelchair-bound for last 3 years presented to the emergency room on 09/16/2019 with 1 week of cough, congestion, worsening shortness of breath.  Initially patient was found with extensive left-sided infiltrate and left-sided effusion with hypoxemia corrected with nonrebreather and admitted to ICU.  Significant Events as below: 12/28 thoracentesis, 1/2 thoracentesis 1/3 intubated and transferred to ICU 1/8 started vanc for MRSA sputum 1/14 acceptable rapid shallow. Following commands, extubated.   1/15 looked better ready for floor. PCCM signed off.  1/18 Rapid response overnight, suspected ongoing aspiration, copious thick secretions, poor mentation. PCCM re-engaged , back on vancomycin and Unasyn and treated for 7 days with vanco and unasyn.  1/22 patient is clinically improving, remains with aspiration risk. 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 -Surgery consulted for surgical G-tube placement , surgical G-tube placed on 1/27. -Tube feeding is started today, 1/28. - Multiple episodes of paroxysmal atrial flutter, ejection fraction 25% thought to be from sepsis. - 1/29 low blood pressures in the morning, probably due to positional changes.  Patient is chronically sick, bedbound and orthostatic.  Antibiotics: Rocephin 12/26 >> (14 day course) 1/8 Azithromycin 12/27 > 28 Vancomycin 12/26, 1/7-1/12, 1/18 > 1/24 Unasyn 1/18 > 1/24  Diagnostic data: Covid 19 PCR 12/26 >> neg RVP 12/26 >>neg  Procalcitonin 12/26 >> 2.01, down trended  to 0.4 with abx BCx2 12/26 >>Pan sensitive Proteus Mirabilis UC 12/26 >> negative Urine Pneum Ag 12/26 >> positive Respiratory culture 1/6>> MRSA  Assessment & Plan:   Principal Problem:   CAP (community acquired pneumonia) Active Problems:   History of CVA with residual deficit left side   Dementia (HCC)   Pleural effusion on left   Acute prerenal azotemia   Hypernatremia   Pressure injury of skin   Protein-calorie malnutrition, severe (HCC)   Acute hypoxemic respiratory failure (HCC)   Pleural effusion   Acute systolic heart failure (HCC)   Failure to thrive in adult   Palliative care by specialist   Goals of care, counseling/discussion   Generalized weakness   Wheelchair dependent 2017-   Memory difficulty   Parkinson's disease ([place])   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 Resolved.  On room air now.  Acute metabolic encephalopathy with history of underlying dementia: CT head negative 1/16.  Mentation fluctuates. With history of previous strokes, contractures and debility.  Repeat CT scan with extensive chronic ischemia.  No new findings.  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 fib with RVR: Previous ejection fraction 55%.  EF 25 to 30% during evidence of sepsis.  Intermittent A. Fib/flutter.  Currently in sinus rhythm.  Repeat echo shows EF 45%.  Patient is euvolemic.  Change to oral diuretics. Appreciate cardiology input.  Patient less likely will tolerate any heart failure therapy. Anticoagulation planning under  discussion.  Hypokalemia: Improved.  Continue replacement with Lasix.  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  Seen by surgery.  Bedside debridement.  PT doing hydrotherapy.  Patient's husband thinks they can take care of it at home with wound care.  Dysphagia: Status post PEG tube placement.  Keep n.p.o.  Will need speech therapy follow-up.    Anemia: Acute on chronic anemia from expected blood loss and multiple procedures.  Hemoglobin was 7.2.  Responded to 1 units of PRBC transfusion.  Hemoglobin 8 today.  Disposition: Nutrition: Tube feeding.  NPO.  Family wants to take patient home and patient desires to go home.  Arrangements to be made.  Foley Catheter care: Patient has stage IV sacral decubitus ulcer, Foley catheter exchanged 1/27.  At this time, for healing of wounds will discharge patient with Foley catheter.    DVT prophylaxis: Heparin subcu Code Status: Full code Family Communication: Patient's husband  Disposition Plan: Home tomorrow if adequate arrangements can be made.  Consultants:   PCCM,  Surgery,  Palliative medicine  Cardiology  Procedures:   Bedside wound debridement, 1/19  Antimicrobials:  Finished multiple rounds of antibiotics.   Subjective: Seen and examined.  Overnight blood pressure was recorded low, 500 mL fluid boluses given.  No other events.  Responded. "I am going home"  Objective: Vitals:   10/20/19 0203 10/20/19 0336 10/20/19 0610 10/20/19 0800  BP:   (!) 84/48 (!) 97/47  Pulse:   67 71  Resp:   16 15  Temp:  (!) 97.2 F (36.2 C)  98.1 F (36.7 C)  TempSrc:  Axillary  Oral  SpO2:   100% 98%  Weight: 58.7 kg     Height:        Intake/Output Summary (Last 24 hours) at 10/20/2019 0957 Last data filed at 10/20/2019 0900 Gross per 24 hour  Intake 1789.46 ml  Output 1800 ml  Net -10.54 ml   Filed Weights   10/18/19 0500 10/19/19 0400 10/20/19 0203  Weight: 55.5 kg 58.7 kg 58.7 kg     Examination:  Physical Exam  Constitutional:  Chronically sick looking.  Debilitated.  Responding appropriately.  HENT:  Head: Atraumatic.  Eyes: Pupils are equal, round, and reactive to light.  Cardiovascular: Normal rate and regular rhythm.  Respiratory:  On room air.  Some conducted airway sounds.  Audibly upper airway gurgling sounds.  GI: Bowel sounds are normal.  G-tube in place.  Intact and dry dressing.  Musculoskeletal:     Cervical back: Normal range of motion.     Comments: Bilateral lower leg contractures, not moving. Right upper extremity normal power. Left upper extremity contracture with minimal movement.  Neurological: She is alert.  Flat affect.  Oriented x1 or 2.  She has fluctuating mentation.    Skin:  Stage IV decubital ulcer left sacrum with dressing.      Data Reviewed: I have personally reviewed following labs and imaging studies  CBC: Recent Labs  Lab 10/16/19 0228 10/16/19 0228 10/17/19 0248 10/18/19 0158 10/19/19 0507 10/19/19 2205 10/20/19 0231  WBC 14.2*  --  14.8* 10.5 15.9*  --  12.0*  NEUTROABS 10.3*  --  10.9* 7.0 13.5*  --  8.5*  HGB 8.7*   < > 9.8* 9.0* 7.2* 8.3* 8.0*  HCT 29.4*   < > 32.3* 30.4* 24.6* 26.2* 26.1*  MCV 97.0  --  97.3 97.1 98.8  --  95.3  PLT 269  --  282 265 260  --  216   < > = values in this interval not displayed.   Basic Metabolic Panel: Recent Labs  Lab 10/14/19 0706 10/14/19 0706 10/15/19 0220 10/16/19 0228 10/17/19 0248 10/19/19 0507 10/20/19 0231  NA 140   < > 139 138 139 139 139  K 4.2   < > 4.7 4.7 4.9 4.5 4.0  CL 101   < > 102 100 97* 100 101  CO2 31   < > 28 28 32 29 28  GLUCOSE 127*   < > 130* 146* 101* 141* 120*  BUN 22   < > 29* 34* 37* 47* 43*  CREATININE 0.50   < > 0.54 0.62 0.59 0.80 0.57  CALCIUM 8.1*   < > 8.2* 8.3* 8.8* 8.7* 8.2*  MG 2.0  --  2.1  --   --  2.2 1.9  PHOS 3.0  --  3.5  --   --  4.2 2.5   < > = values in this interval not displayed.   GFR: Estimated  Creatinine Clearance: 61.5 mL/min (by C-G formula based on SCr of 0.57 mg/dL). Liver Function Tests: Recent Labs  Lab 10/14/19 0706 10/15/19 0220 10/16/19 0228 10/17/19 0248  AST 16 18 24  32  ALT 22 21 21 27   ALKPHOS 82 77 72 80  BILITOT 0.5 0.6 0.6 0.8  PROT 5.9* 5.9* 5.9* 6.8  ALBUMIN 2.4* 2.4* 2.3* 2.7*   No results for input(s): LIPASE, AMYLASE in the last 168 hours. No results for input(s): AMMONIA in the last 168 hours. Coagulation Profile: No results for input(s): INR, PROTIME in the last 168 hours. Cardiac Enzymes: No results for input(s): CKTOTAL, CKMB, CKMBINDEX, TROPONINI in the last 168 hours. BNP (last 3 results) No results for input(s): PROBNP in the last 8760 hours. HbA1C: No results for input(s): HGBA1C in the last 72 hours. CBG: Recent Labs  Lab 10/19/19 1535 10/19/19 1955 10/19/19 2319 10/20/19 0333 10/20/19 0752  GLUCAP 94 138* 142* 122* 107*   Lipid Profile: No results for input(s): CHOL, HDL, LDLCALC, TRIG, CHOLHDL, LDLDIRECT in the last 72 hours. Thyroid Function Tests: Recent Labs    10/20/19 0231  TSH 3.810  FREET4 0.81   Anemia Panel: No results for input(s): VITAMINB12, FOLATE, FERRITIN, TIBC, IRON, RETICCTPCT in the last 72 hours. Sepsis Labs: No results for input(s): PROCALCITON, LATICACIDVEN in the last 168 hours.  No results found for this or any previous visit (from the past 240 hour(s)).       Radiology Studies: ECHOCARDIOGRAM LIMITED  Result Date: 10/19/2019   ECHOCARDIOGRAM LIMITED REPORT   Patient Name:   Barbara Flowers Date of Exam: 10/19/2019 Medical Rec #:  [id number]      Height:       68.0 in Accession #:    [id number]     Weight:       129.4 lb Date of Birth:  1951/07/01      BSA:          1.70 m Patient Age:    71 years       BP:           102/77 mmHg Patient Gender: F              HR:           74 bpm. Exam Location:  Inpatient  Procedure: Limited Echo and  Intracardiac Opacification Agent Indications:     Cardiomyopathy-Unspecified 425.9  History:        Patient has prior history of Echocardiogram examinations, most                 recent 09/24/2019. Pleural effusion.  Sonographer:    Paulita Fujita RDCS Referring Phys: [id number] [place]  1. Left ventricular ejection fraction, by visual estimation, is 45 to 50%. Left ventricular septal wall thickness was normal. Normal left ventricular posterior wall thickness. There is no increased left ventricular wall thickness.  2. Left ventricular diastolic function could not be evaluated.  3. Right ventricular volume/pressure overload.  4. The left ventricle demonstrates regional wall motion abnormalities.  5. Apical akinesis. Systolic function has improved from 25-30% 09/24/19.  6. The mitral valve is grossly normal.  7. The tricuspid valve was not assessed. FINDINGS  Left Ventricle: Left ventricular ejection fraction, by visual estimation, is 45 to 50%. The left ventricle demonstrates regional wall motion abnormalities. Left ventricular septal wall thickness was normal. Normal left ventricular posterior wall thickness. There is no increased left ventricular wall thickness. Abnormal (paradoxical) septal motion, consistent with right ventricular volume overload and/or elevated RV end-diastolic pressure. Left ventricular diastolic function could not be evaluated. Apical akinesis. Systolic function has improved from 25-30% 09/24/19. Left Atrium: Left atrial size was normal in size. Right Atrium: Right atrial size was normal in size. Mitral Valve: The mitral valve is grossly normal. Tricuspid Valve: The tricuspid valve is not assessed. Aortic Valve: The aortic valve is grossly normal. Pulmonic Valve: The pulmonic valve was not assessed. Aorta: The aortic root, ascending aorta, aortic arch and descending aorta are all structurally normal, with no evidence of dilitation or obstruction.  Skeet Latch MD Electronically signed by Skeet Latch MD Signature Date/Time:  10/19/2019/6:50:12 PMThe mitral valve is grossly normal.    Final         Scheduled Meds: . chlorhexidine  15 mL Mouth Rinse BID  . Chlorhexidine Gluconate Cloth  6 each Topical Daily  . collagenase   Topical Daily  . donepezil  10 mg Per Tube QHS  . feeding supplement (PRO-STAT SUGAR FREE 64)  30 mL Per Tube Daily  . free water  100 mL Per Tube Q4H  . Gerhardt's butt cream   Topical BID  . heparin  5,000 Units Subcutaneous Q8H  . mouth rinse  15 mL Mouth Rinse q12n4p  . sennosides  5 mL Per Tube BID   Continuous Infusions: . sodium chloride Stopped (10/16/19 1900)  . feeding supplement (OSMOLITE 1.5 CAL) 50 mL/hr at 10/20/19 0600     LOS: 34 days    Time spent: 25 minutes     [name], MD [facility] Pager [phone]

## 2019-10-21 LAB — BASIC METABOLIC PANEL
Anion gap: 8 (ref 5–15)
BUN: 32 mg/dL — ABNORMAL HIGH (ref 8–23)
CO2: 28 mmol/L (ref 22–32)
Calcium: 8.1 mg/dL — ABNORMAL LOW (ref 8.9–10.3)
Chloride: 102 mmol/L (ref 98–111)
Creatinine, Ser: 0.38 mg/dL — ABNORMAL LOW (ref 0.44–1.00)
GFR calc Af Amer: >60 mL/min (ref >60)
GFR calc non Af Amer: >60 mL/min (ref >60)
Glucose, Bld: 129 mg/dL — ABNORMAL HIGH (ref 70–99)
Potassium: 4.2 mmol/L (ref 3.5–5.1)
Sodium: 138 mmol/L (ref 135–145)

## 2019-10-21 LAB — CBC WITH DIFFERENTIAL/PLATELET
Abs Immature Granulocytes: 0.04 10*3/uL (ref 0.00–0.07)
Basophils Absolute: 0 10*3/uL (ref 0.0–0.1)
Basophils Relative: 0 %
Eosinophils Absolute: 0.1 10*3/uL (ref 0.0–0.5)
Eosinophils Relative: 1 %
HCT: 26.3 % — ABNORMAL LOW (ref 36.0–46.0)
Hemoglobin: 7.9 g/dL — ABNORMAL LOW (ref 12.0–15.0)
Immature Granulocytes: 0 %
Lymphocytes Relative: 19 %
Lymphs Abs: 2.1 10*3/uL (ref 0.7–4.0)
MCH: 29.2 pg (ref 26.0–34.0)
MCHC: 30 g/dL (ref 30.0–36.0)
MCV: 97 fL (ref 80.0–100.0)
Monocytes Absolute: 1 10*3/uL (ref 0.1–1.0)
Monocytes Relative: 9 %
Neutro Abs: 8.1 10*3/uL — ABNORMAL HIGH (ref 1.7–7.7)
Neutrophils Relative %: 71 %
Platelets: 210 10*3/uL (ref 150–400)
RBC: 2.71 MIL/uL — ABNORMAL LOW (ref 3.87–5.11)
RDW: 15.7 % — ABNORMAL HIGH (ref 11.5–15.5)
WBC: 11.3 10*3/uL — ABNORMAL HIGH (ref 4.0–10.5)
nRBC: 0 % (ref 0.0–0.2)

## 2019-10-21 LAB — GLUCOSE, CAPILLARY
Glucose-Capillary: 123 mg/dL — ABNORMAL HIGH (ref 70–99)
Glucose-Capillary: 111 mg/dL — ABNORMAL HIGH (ref 70–99)
Glucose-Capillary: 104 mg/dL — ABNORMAL HIGH (ref 70–99)
Glucose-Capillary: 107 mg/dL — ABNORMAL HIGH (ref 70–99)

## 2019-10-21 MED ORDER — FREE WATER: 100.0000 mL | Status: AC

## 2019-10-21 MED ORDER — PRO-STAT SUGAR FREE PO LIQD: 30.0000 mL | Freq: Every day | ORAL | 0 refills | Status: AC

## 2019-10-21 MED ORDER — COLLAGENASE 250 UNIT/GM EX OINT: TOPICAL_OINTMENT | Freq: Every day | CUTANEOUS | 0 refills | Status: AC

## 2019-10-21 MED ORDER — OSMOLITE 1.5 CAL PO LIQD: 1000.0000 mL | ORAL | 0 refills | Status: AC

## 2019-10-21 MED ORDER — ACETAMINOPHEN 160 MG/5ML PO SOLN: 650.0000 mg | ORAL | 0 refills | Status: AC | PRN

## 2019-10-21 NOTE — TOC Progression Note (Signed)
Transition of Care [facility]) - Progression Note    Patient Details  Name: Barbara Flowers MRN: [id number] Date of Birth: December 27, 1950  Transition [facility] CM/SW Contact  [place], Chrystal [place], [place] Phone Number: 10/21/2019, 1:22 PM  Clinical Narrative:    Patient to disccharge home today with [facility] through [facility]. This social worker spoke to patient's husband who states that [facility] has delivered the pump, stand and formula. He would like a hospital bed for patient. This social worker explained that there is a shortage of hospital beds, of which he understood.  [employer] contacted who stated they were out of stock. This social worker contacted [employer] (they were listed as having hospital beds in [place]) at [phone] and [phone]. Voicemail message was left for a return call. Contact information also provided to patient's spouse to follow up on Monday as well. [facility] contacted to transport patient home today.   [place], LCSW Clinical Social Work [phone]        Expected Discharge Plan and Services    Patient to discharge home with [facility].       Expected Discharge Date: 10/21/19                                     Social Determinants of Health (SDOH) Interventions    Readmission Risk Interventions Readmission Risk Prevention Plan 09/20/2019  Transportation Screening Complete  Home Care Screening Complete  Some recent data might be hidden

## 2019-10-21 NOTE — Discharge Summary (Signed)
Physician Discharge Summary  [name] STM:[id number] DOB: Mar 06, 1951 DOA: 09/16/2019  PCP: [name], NP  Admit date: 09/16/2019 Discharge date: 10/21/2019  Admitted From: Home Disposition: Home with home health  Recommendations for Outpatient Follow-up:  1. Follow up with PCP in 1-2 weeks 2. Please obtain BMP/CBC in one week   Home Health: PT, OT, speech, home health aide, social worker Equipment/Devices: Tube feeding  Discharge Condition: Fair CODE STATUS: Full code Diet recommendation: NPO.  Teaspoon sips of water with supervision and aspiration precautions.  Tube feeding.  Discharge summary:  69 year old female with history of breast cancer, status post mastectomy, brain aneurysm, dementia, history of CVA with left-sided hemiplegia, lower extremity contractures, wheelchair-bound for at least last 3 years was admitted to the hospital on 12/26 with 1 week of cough, congestion, worsening shortness of breath.  Initially patient was found with extensive left-sided infiltrate and left-sided effusion with hypoxemia that was corrected with nonrebreather and admitted to ICU. Patient is going home today after 34 days of hospitalization, she had extensive events happened in the hospital that were documented in progress notes.  Her diagnosis and further management plan is as following:  Acute hypoxic respiratory failure: Due to combination of sepsis, aspiration pneumonia, acute systolic heart failure.  Initially treated with nonrebreather.  She was on mechanical ventilation from 1/3-1/14.  She had another episode of aspiration and admitted to ICU.  With all treatments, she has been on room air for last 1 week. She received 2 rounds of IV antibiotics, including vancomycin and Augmentin to cover for MRSA and pneumonia on her sputum and aspiration.  Oxygenation has been improved.  No additional needs.  She will need all time aspiration precautions.  Acute metabolic encephalopathy with  history of underlying dementia/severe debility/dysphagia: Subsequent CT scans were negative for new events.  It showed extensive chronic ischemia.  Mental status improved. On multiple evaluations, patient was deemed unsafe to swallow.  She was on NG tube feeding.  She did not safely swallow and was not able to maintain her nutrition and water intake, ultimate decision was made for a PEG tube placement.  Surgery placed PEG tube and patient was started on tube feeding with good response.  Her electrolytes are normal. Patient will be n.p.o., she will have a few spoonful of water to keep her mouth moist.  We will have a speech therapy evaluation after discharge. Patient will have tube feeding that are ordered along with free water flush. Suggested to avoid all sedating presents as well as her dementia medications including Namenda that was making her drowsy.  Sacral decubitus ulcer stage IV present on admission: Patient has multiple sacral decubitus ulcer, mostly on the left side of the sacrum.  She underwent bedside debridement by surgery and was receiving hydrotherapy in the hospital.  She will have continuous wound care at home. Patient had Foley catheter during her hospitalization, she has 2 sacral decubitus wounds and has incontinence.  Foley catheter will be continued on discharge, to be followed by home health nurse.  Removal in 2 to 3 weeks once wounds are healing.  Anemia: Developed anemia, mostly acute on chronic, anticipated with prolonged hospitalization procedures and sickness.  Received 1 unit of PRBC.  Will need close monitoring at home.  Suggested recheck in 1 week.  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 fib with RVR: Previously known ejection fraction 55%.  During her acute illness, she had fluid overload and echocardiogram consistent with acute congestive heart failure with  EF of 25%.  Patient was also in and out of paroxysmal A. fib, and sinus tachycardia's.   She was treated with different regimen, mostly diuretics and electrolyte replacement. A repeat echocardiogram showed improvement with EF 45%.  Patient is euvolemic on current regimen of tube feeding and free water flush.  She did not tolerate any beta-blockers or diuretics.  Her fluid status is well-balanced now. Also seen by cardiology.  Currently not starting on any anticoagulation.  She remains in sinus rhythm.  She will have outpatient follow-up. Patient is on midodrine that she will continue through the tube with history of orthostatic hypotension as well as debility and autonomic dysfunction.  Patient has extensive medical issues and now needing total care including tube feeding, wound care and nursing care.  Family decided to take her home as they have adequate support at home.  Discharge Diagnoses:  Principal Problem:   CAP (community acquired pneumonia) Active Problems:   History of CVA with residual deficit left side   Dementia (HCC)   Pleural effusion on left   Acute prerenal azotemia   Hypernatremia   Pressure injury of skin   Protein-calorie malnutrition, severe (HCC)   Acute hypoxemic respiratory failure (HCC)   Pleural effusion   Acute systolic heart failure (HCC)   Failure to thrive in adult   Palliative care by specialist   Goals of care, counseling/discussion   Generalized weakness   Wheelchair dependent 2017-   Memory difficulty   Parkinson's disease ([place])   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[place])   Physical debility   Decreased functional mobility   Sacral decubitus ulcer, stage IV ([place])   H/O bone marrow transplant ([place])   History of Roux-en-Y gastric bypass   Dysphagia   Kyphoscoliosis   Left spastic hemiparesis with h/o strokes   Malnutrition of moderate degree    Discharge Instructions  Discharge Instructions    Call MD for:  difficulty breathing, headache or visual disturbances   Complete by: As directed    Call MD for:  redness, tenderness, or signs of  infection (pain, swelling, redness, odor or green/yellow discharge around incision site)   Complete by: As directed    Discharge instructions   Complete by: As directed    Leave foley in place for 2-3 weeks to allow wound healing. Dressing as per instructions  NPO except occasional supervised water through spoon, mois   Discharge wound care:   Complete by: As directed    Wet to dry dressing 5 times a week, use sentyl with dressing.   Increase activity slowly   Complete by: As directed      Allergies as of 10/21/2019      Reactions   Gabapentin Other (See Comments)   seizure   Tramadol Other (See Comments)   Brings on Seizures       Medication List    STOP taking these medications   azithromycin 250 MG tablet Commonly known as: ZITHROMAX   chlorhexidine 0.12 % solution Commonly known as: PERIDEX   donepezil 10 MG tablet Commonly known as: ARICEPT   magnesium oxide 400 (241.3 Mg) MG tablet Commonly known as: MAG-OX   memantine 10 MG tablet Commonly known as: NAMENDA   QUEtiapine 100 MG tablet Commonly known as: SEROQUEL     TAKE these medications   acetaminophen 160 MG/5ML solution Commonly known as: TYLENOL Place 20.3 mLs (650 mg total) into feeding tube every 4 (four) hours as needed for fever.   ALPRAZolam 1 MG tablet Commonly known as:  XANAX Take 1 mg by mouth at bedtime as needed for anxiety.   aspirin EC 81 MG tablet Take 81 mg by mouth daily.   atorvastatin 20 MG tablet Commonly known as: LIPITOR Take 20 mg by mouth at bedtime.   collagenase ointment Commonly known as: SANTYL Apply topically daily. Start taking on: October 22, 2019   feeding supplement (OSMOLITE 1.5 CAL) Liqd Place 1,000 mLs into feeding tube continuous.   feeding supplement (PRO-STAT SUGAR FREE 64) Liqd Place 30 mLs into feeding tube daily. Start taking on: October 22, 2019   ferrous sulfate 325 (65 FE) MG EC tablet Take 325 mg by mouth daily with breakfast.   free water  Soln Place 100 mLs into feeding tube every 4 (four) hours.   midodrine 10 MG tablet Commonly known as: PROAMATINE Take 10 mg by mouth 3 (three) times daily.   multivitamin with minerals tablet Take 1 tablet by mouth daily.   pantoprazole 40 MG tablet Commonly known as: PROTONIX Take 40 mg by mouth daily.   Potassium 99 MG Tabs Take 99 mg by mouth daily.   vitamin C 1000 MG tablet Take 1,000 mg by mouth daily.   Vitamin D3 50 MCG (2000 UT) Tabs Take 2,000 Units by mouth daily.            Discharge Care Instructions  (From admission, onward)         Start     Ordered   10/21/19 0000  Discharge wound care:    Comments: Wet to dry dressing 5 times a week, use sentyl with dressing.   10/21/19 1135         Follow-up Information    O'Neal, Cassie Freer, MD Follow up.   Specialties: Internal Medicine, Cardiology, Radiology Why: [facility] location - see appointment information below for Nov 17, 2019 at 10:00 AM. Dr. Audie Box is the cardiologist you met in the hospital. Contact information: [place] [address] [phone]          Allergies  Allergen Reactions  . Gabapentin Other (See Comments)    seizure  . Tramadol Other (See Comments)    Brings on Seizures     Consultations:  Cardiology  Palliative medicine  PCCM  Surgery  Interventional neurology  Psychiatry   Procedures/Studies: CT ABDOMEN WO CONTRAST  Result Date: 10/12/2019 CLINICAL DATA:  Stroke, preop planning for gastrostomy tube EXAM: CT ABDOMEN WITHOUT CONTRAST TECHNIQUE: Multidetector CT imaging of the abdomen was performed following the standard protocol without IV contrast. COMPARISON:  09/22/2019 CT chest FINDINGS: Lower chest: Resolution of pleural effusions. Pleural based nodular consolidation laterally in the left lower lung. Scattered nodular airspace opacities in the basilar segments of the left lower lung. Coarse calcifications in the left  breast. Hepatobiliary: No focal liver abnormality is seen. Status post cholecystectomy. No biliary dilatation. Pancreas: Unremarkable. No pancreatic ductal dilatation or surrounding inflammatory changes. Spleen: Normal in size with focal coarse linear calcification. Adrenals/Urinary Tract: No adrenal mass. Kidneys normal in size. No urolithiasis or hydronephrosis. Stomach/Bowel: Surgical staples near the GE junction. 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 This does not allow percutaneous gastrostomy placement. Visualized small bowel is decompressed. Residual oral contrast material in the visualized colon which is nondilated. Vascular/Lymphatic: Aortic Atherosclerosis (ICD10-170.0). No abdominal or mesenteric adenopathy. Other: No free air.  No ascites. Musculoskeletal: Mild lumbar levoscoliosis apex L2. Mild T10 and T11 wedge deformities, age indeterminate. No acute fracture or worrisome bone lesion.  IMPRESSION: 1. The patient is status post gastric bypass surgery, not a candidate for percutaneous gastrostomy placement. Consider surgical consultation. Electronically Signed   By: Lucrezia Europe M.D.   On: 10/12/2019 15:34   DG Abd 1 View  Result Date: 10/15/2019 CLINICAL DATA:  NG tube placement EXAM: ABDOMEN - 1 VIEW COMPARISON:  10/13/2019 FINDINGS: Enteric tube terminates in the mid gastric body. Surgical sutures in the left upper/mid abdomen. Cholecystectomy clips. Nonobstructive bowel gas pattern. IMPRESSION: Enteric tube terminates in the mid gastric body. Electronically Signed   By: Julian Hy M.D.   On: 10/15/2019 06:04   DG Abd 1 View  Result Date: 10/13/2019 CLINICAL DATA:  Nasogastric tube positioning. EXAM: ABDOMEN - 1 VIEW COMPARISON:  October 07, 2019 FINDINGS: A nasogastric tube is seen with its distal tip noted within the distal esophagus, near the level of the gastroesophageal junction. The bowel gas pattern is normal. No radio-opaque  calculi or other significant radiographic abnormality are seen. Radiopaque surgical clips are seen overlying the right upper quadrant with additional radiopaque surgical clips and surgical sutures seen overlying the left upper quadrant. IMPRESSION: 1. Nasogastric tube positioning, as described above. Further advancement of the NG tube by approximately 6 cm is recommended to decrease the risk of aspiration. Electronically Signed   By: Virgina Norfolk M.D.   On: 10/13/2019 16:52   DG Abd 1 View  Result Date: 09/24/2019 CLINICAL DATA:  OG tube placement verification EXAM: ABDOMEN - 1 VIEW COMPARISON:  Abdominal radiograph 09/20/2019 FINDINGS: The nasogastric tube side port projects over the stomach. There are no dilated loops of bowel in the partially visualized upper abdomen. Surgical clips are seen in the right upper quadrant. Please see concurrent chest radiograph for detailed lung findings. IMPRESSION: Nasogastric tube side port appropriately projects over the stomach. Electronically Signed   By: Audie Pinto M.D.   On: 09/24/2019 12:07   CT HEAD WO CONTRAST  Result Date: 10/17/2019 CLINICAL DATA:  Altered mental status.  Dysphagia EXAM: CT HEAD WITHOUT CONTRAST TECHNIQUE: Contiguous axial images were obtained from the base of the skull through the vertex without intravenous contrast. COMPARISON:  CT head 10/07/2019 FINDINGS: Brain: Moderate to advanced atrophy. Chronic microvascular ischemic changes in the white matter. Chronic ischemic change in the pons. Chronic infarct left thalamus. Chronic ischemic changes in the cerebellum bilaterally. Right parietal craniotomy. Parenchymal calcification at the craniotomy site is chronic and unchanged. Negative for acute infarct, hemorrhage, mass Vascular: Normal arterial flow voids. Skull: Right parietal craniotomy. Sinuses/Orbits: Mild mucosal edema paranasal sinuses. Bilateral mastoid effusion. Bilateral cataract extraction. Other: None IMPRESSION: Atrophy  and extensive chronic ischemia. No acute abnormality and no change from the prior CT. Electronically Signed   By: Franchot Gallo M.D.   On: 10/17/2019 12:44   CT HEAD WO CONTRAST  Result Date: 10/07/2019 CLINICAL DATA:  Subacute neurological deficit. EXAM: CT HEAD WITHOUT CONTRAST TECHNIQUE: Contiguous axial images were obtained from the base of the skull through the vertex without intravenous contrast. COMPARISON:  10/25/2017 FINDINGS: Brain: Generalized brain atrophy. Chronic small-vessel ischemic changes the pons. Old left cerebellar infarction. Old infarction left thalamus. Extensive chronic small-vessel ischemic changes throughout the brain. Previous right parietal craniotomy with underlying encephalomalacia and some parenchymal calcification. Atrophic changes are somewhat progressive since 2019 but I do not see an acute insult. Vascular: There is atherosclerotic calcification of the major vessels at the base of the brain. Skull: Right parietal craniotomy as noted above. Sinuses/Orbits: Clear/normal Other: None IMPRESSION: No acute finding by  CT. Advanced generalized brain atrophy. Extensive chronic ischemic changes throughout the brain as outlined above. Old right parietal craniotomy with underlying chondromalacia and some parenchymal calcification. Electronically Signed   By: Nelson Chimes M.D.   On: 10/07/2019 14:24   CT CHEST W CONTRAST  Result Date: 09/22/2019 CLINICAL DATA:  Pneumonia, effusion or abscess suspected, large pneumonia with diffusion requiring thoracentesis, persistent to kidney and leukocytosis EXAM: CT CHEST WITH CONTRAST TECHNIQUE: Multidetector CT imaging of the chest was performed during intravenous contrast administration. CONTRAST:  79m OMNIPAQUE IOHEXOL 300 MG/ML  SOLN COMPARISON:  Chest radiographs, 09/19/2019 FINDINGS: Cardiovascular: Aortic atherosclerosis. Normal heart size. Scattered coronary artery calcifications. No pericardial effusion. Mediastinum/Nodes: No enlarged  mediastinal, hilar, or axillary lymph nodes. Thyroid gland, trachea, and esophagus demonstrate no significant findings. Lungs/Pleura: There is a large left pleural effusion with near total atelectasis or consolidation of the left lung. Small right pleural effusion and associated atelectasis or consolidation of the dependent right lung base. The left mainstem and distal bronchi and right intralobar and distal bronchi are fluid and debris filled. Upper Abdomen: No acute abnormality. Musculoskeletal: No chest wall mass or suspicious bone lesions identified. Dystrophic appearing calcifications in the lateral left breast. IMPRESSION: 1. Large left pleural effusion with near total atelectasis or consolidation of the left lung. 2. Small right pleural effusion and associated atelectasis or consolidation of the dependent right lung base. 3. The left mainstem and distal bronchi and right intralobar and distal bronchi are fluid and debris filled. 4. Coronary artery disease. Aortic Atherosclerosis (ICD10-I70.0). Electronically Signed   By: AEddie CandleM.D.   On: 09/22/2019 15:34   DG CHEST PORT 1 VIEW  Result Date: 10/12/2019 CLINICAL DATA:  Pneumonia. EXAM: PORTABLE CHEST 1 VIEW COMPARISON:  10/10/2019.  CT 09/22/2019. FINDINGS: Mediastinum and hilar structures normal. Heart size normal. Left mid and lower lung infiltrates are again noted without interim change. No pleural effusion or pneumothorax. No pleural effusion or pneumothorax. Surgical clips left chest. Severe degenerative changes left shoulder. Moderate degenerative changes right shoulder. IMPRESSION: Left mid and lower lung infiltrates.  No interim change. Electronically Signed   By: TMarcello Moores Register   On: 10/12/2019 06:28   DG CHEST PORT 1 VIEW  Result Date: 10/10/2019 CLINICAL DATA:  Hypoxia. EXAM: PORTABLE CHEST 1 VIEW COMPARISON:  10/09/2019 FINDINGS: The heart size is normal. NG tube was removed. Atherosclerotic changes are noted at the aortic arch.  Asymmetric left-sided airspace disease is again seen. Left pleural effusion is suspected. The right lung remains clear. IMPRESSION: 1. Stable asymmetric left-sided airspace disease and probable effusion. Findings are consistent with pneumonia. 2. Atherosclerosis of the thoracic aorta. Electronically Signed   By: CSan MorelleM.D.   On: 10/10/2019 06:15   DG CHEST PORT 1 VIEW  Result Date: 10/09/2019 CLINICAL DATA:  Follow-up atelectasis involving the LEFT lung related to mucous plugging. EXAM: PORTABLE CHEST 1 VIEW 12:20 p.m.: COMPARISON:  Portable chest x-ray earlier same day 12:35 a.m. and previously. FINDINGS: Since the examination earlier same day, the nasogastric tube has withdrawn such that its tip is now in the distal esophagus. The tube should be readvanced. Interval improved aeration in the LEFT lung since earlier today, though consolidation persists in the LEFT LOWER LOBE. There are patchy airspace opacities in the LEFT UPPER LOBE. RIGHT lung remains clear. Dense calcifications in the LEFT breast are likely benign and dystrophic in origin. IMPRESSION: 1. The nasogastric tube has withdrawn such that its tip is now in the distal esophagus. The  tube should be readvanced. 2. Improved aeration in the LEFT lung since earlier today, though dense atelectasis and/or pneumonia persists in the LEFT LOWER LOBE and there is patchy pneumonia involving the LEFT UPPER LOBE. These results will be called to the ordering clinician or representative by the Radiologist Assistant, and communication documented in the PACS or zVision Dashboard. Electronically Signed   By: Evangeline Dakin M.D.   On: 10/09/2019 12:51   DG CHEST PORT 1 VIEW  Result Date: 10/09/2019 CLINICAL DATA:  Increased oxygen demand. EXAM: PORTABLE CHEST 1 VIEW COMPARISON:  Radiograph 10/07/2019, CT 09/22/2019 FINDINGS: Significant increased volume loss the left hemithorax with increased opacity in the perihilar region and left lung base. There  is leftward mediastinal shift, likely accentuated by patient rotation. Right lung is clear. No visualized pneumothorax. Surgical clips in the left axilla. Calcifications project over the left breast. Diffuse bony under mineralization. Chronic change about the left shoulder. Enteric tube in place with tip below the diaphragm. IMPRESSION: Increased volume loss in the left hemithorax over the past 2 days with increased opacity in the perihilar region and left lung base. Favor mucous plugging or aspiration given volume loss, re-accumulation of left pleural fluid is also considered but felt less likely. Electronically Signed   By: Keith Rake M.D.   On: 10/09/2019 01:10   DG CHEST PORT 1 VIEW  Result Date: 10/07/2019 CLINICAL DATA:  Follow-up pleural effusion EXAM: PORTABLE CHEST 1 VIEW COMPARISON:  10/04/2019 FINDINGS: There is been interval removal of endotracheal tube. Enteric tube courses below the diaphragm with distal tip beyond the inferior margin of the film. Stable heart size. Persistent hazy opacity in the left lung base, which is similar to slightly worsened compared to the prior study. No appreciable pleural fluid collection on frontal view. Right lung is clear. Surgical clips in the left axilla and multiple left breast calcifications, which project over the left costophrenic angle. Severe degenerative changes of the left shoulder. IMPRESSION: 1. Persistent hazy opacity in the left lung base, similar to slightly worsened compared to the prior study. 2. Interval removal of endotracheal tube. Electronically Signed   By: Davina Poke D.O.   On: 10/07/2019 17:28   DG CHEST PORT 1 VIEW  Result Date: 10/04/2019 CLINICAL DATA:  Respiratory failure and hypoxemia. EXAM: PORTABLE CHEST 1 VIEW COMPARISON:  10/02/2019 FINDINGS: ET tube tip is above the carina. There is a nasogastric tube with tip in the left upper quadrant of the abdomen. Normal heart size. Aortic atherosclerosis. There is left lung  volume loss with diffuse reticular interstitial opacities throughout the left lung. When compared with the previous exam there is been improved aeration to the left lung base. Right lung remains hyperinflated but clear. IMPRESSION: 1. Improved aeration to the left lung base. Electronically Signed   By: Kerby Moors M.D.   On: 10/04/2019 09:15   DG CHEST PORT 1 VIEW  Result Date: 10/02/2019 CLINICAL DATA:  Respiratory failure EXAM: PORTABLE CHEST 1 VIEW COMPARISON:  09/30/2019 FINDINGS: Endotracheal and enteric tubes are again identified. There is improved aeration at the left lung base. Otherwise persistent ill-defined increased density of the left lung. Stable cardiomediastinal contours. Degenerative changes at the glenohumeral joints. IMPRESSION: Improved aeration at the left lung base. Otherwise no substantial change since 09/30/2019. Electronically Signed   By: Macy Mis M.D.   On: 10/02/2019 09:24   DG CHEST PORT 1 VIEW  Result Date: 09/30/2019 CLINICAL DATA:  Bronchoscopy and bronchial alveolar lavage. EXAM: PORTABLE CHEST 1  VIEW COMPARISON:  09/30/2019 at 12:37 a.m. FINDINGS: Endotracheal tube tip is at the level of the clavicular heads. Orogastric tube side port projects within the stomach. Improved aeration of the left lung mild persistent basilar opacity. Right lung is clear. IMPRESSION: 1. Endotracheal tube tip at the level of the clavicular heads. 2. Improved aeration of the left lung with mild persistent basilar opacity. Electronically Signed   By: Ulyses Jarred M.D.   On: 09/30/2019 02:54   DG CHEST PORT 1 VIEW  Result Date: 09/30/2019 CLINICAL DATA:  69 year old female with hypoxia. EXAM: PORTABLE CHEST 1 VIEW COMPARISON:  Chest radiograph dated 09/29/2019. FINDINGS: The patient is rotated and tilted which limits evaluation for the exam. Endotracheal tube with tip 3 cm above the carina. Enteric tube extends below the diaphragm with tip beyond the inferior margin of the image. Interval  complete opacification of the left hemithorax which may represent combination of pleural effusion, atelectasis, infiltrate. There is shift of the mediastinum into the left hemithorax, representing a degree of volume loss, likely secondary to atelectasis. The right lung remains clear. No pneumothorax. Osteopenia with degenerative changes of the spine and shoulders. No acute osseous pathology. Surgical clips noted over the left upper chest. Amorphous calcification of the left breast tissue similar to prior radiograph. IMPRESSION: 1. Interval complete opacification of the left hemithorax with shift of the mediastinum into the left hemithorax. 2. Endotracheal tube with tip above the carina. These results were called by telephone at the time of interpretation on 09/30/2019 at 12:54 am to nurse McClamroch, who verbally acknowledged these results. Electronically Signed   By: Anner [place] M.D.   On: 09/30/2019 00:57   DG CHEST PORT 1 VIEW  Result Date: 09/29/2019 CLINICAL DATA:  Hypoxia.  Endotracheal placement. EXAM: PORTABLE CHEST 1 VIEW COMPARISON:  09/27/2019 FINDINGS: Endotracheal tube tip is 5 cm above the carina. Orogastric or nasogastric tube enters the stomach. Right chest remains clear. Persistent abnormal density in the low left chest consistent with collapse/infiltrate/effusion. No acute bone finding. IMPRESSION: Endotracheal tube tip 5 cm above the carina. Persistent abnormal density in the lower left chest consistent with left effusion and left lower lobe collapse/infiltrate. Electronically Signed   By: Nelson Chimes M.D.   On: 09/29/2019 10:28   DG CHEST PORT 1 VIEW  Result Date: 09/27/2019 CLINICAL DATA:  ETT position EXAM: PORTABLE CHEST 1 VIEW COMPARISON:  Radiograph 09/26/2019 FINDINGS: Endotracheal tube position in the mid trachea, 4.5 cm from the carina. Transesophageal tube tip and side port are beyond the GE junction, terminating below the level of imaging. Additional support devices overlie  the chest wall. Stable postsurgical changes in the region left axilla and coarse left breast calcifications on, unchanged from comparison CT. There is some persistent atelectatic changes in both lung bases with gradient basilar opacity compatible with residual pleural effusions. Overall appearance is similar to comparison radiograph from 1 day prior. No pneumothorax is seen. Chronic scapular deformity is unchanged. Severe degenerative changes present in both shoulders. Mild degenerative changes in the spine. IMPRESSION: 1. Endotracheal tube tip is 4.5 cm from the carina. Transesophageal tube tip and side port distal to the GE junction. 2. Persistent bibasilar opacities, likely atelectatic changes, with additional gradient basilar density compatible with residual pleural effusions. Electronically Signed   By: Lovena Le M.D.   On: 09/27/2019 05:30   DG CHEST PORT 1 VIEW  Result Date: 09/26/2019 CLINICAL DATA:  Intubation EXAM: PORTABLE CHEST 1 VIEW COMPARISON:  Yesterday FINDINGS: Endotracheal tube tip  is at the clavicular heads. The orogastric tube reaches the stomach. Hazy opacity at the left more than right base. Calcific density at the left base localized to the breast by recent CT. No evidence of edema or pneumothorax. Distorted appearance of the left shoulder girdle likely related to remote radiotherapy. IMPRESSION: 1. Stable hardware positioning. 2. Continued lobar collapse on the left more than right. Electronically Signed   By: Monte Fantasia M.D.   On: 09/26/2019 04:41   DG CHEST PORT 1 VIEW  Result Date: 09/25/2019 CLINICAL DATA:  Shortness of breath EXAM: PORTABLE CHEST 1 VIEW COMPARISON:  09/24/2019 FINDINGS: Endotracheal tube terminates 4 cm above the carina. Moderate layering left pleural effusion. Associated left lower lobe opacity. Right lung is essentially clear. No pneumothorax. Heart is normal size. Surgical clips in the left upper chest wall/axilla. Enteric tube courses into the stomach.  IMPRESSION: Endotracheal tube terminates 4 cm above the carina. Additional support apparatus as above. Moderate layering left pleural effusion. Associated left lower opacity likely atelectasis. Electronically Signed   By: Julian Hy M.D.   On: 09/25/2019 09:56   DG CHEST PORT 1 VIEW  Result Date: 09/24/2019 CLINICAL DATA:  Status post intubation EXAM: PORTABLE CHEST 1 VIEW COMPARISON:  Chest radiograph 09/24/2019 FINDINGS: Interval intubation with endotracheal tube tip between the thoracic inlet and carina. Interval placement of a nasogastric tube with side port projecting over the stomach. Stable cardiomediastinal contours with enlarged heart size. Unchanged appearance of the lungs with left mid lung and basilar airspace disease and layering bilateral effusions. No pneumothorax. IMPRESSION: 1. Appropriate positioning of the endotracheal and nasogastric tubes. 2. Left lung airspace disease and bilateral layering effusions are unchanged. Electronically Signed   By: Audie Pinto M.D.   On: 09/24/2019 12:09   DG CHEST PORT 1 VIEW  Result Date: 09/24/2019 CLINICAL DATA:  Increased shortness of breath. EXAM: PORTABLE CHEST 1 VIEW COMPARISON:  09/23/2019 FINDINGS: NG tube in place with side port below GE junction. Normal heart size. Bilateral posterior layering pleural effusions. Left midlung and left lower lobe airspace disease unchanged. IMPRESSION: No change in bilateral pleural effusions and left midlung and left lower lobe airspace disease compatible with pneumonia. Electronically Signed   By: Kerby Moors M.D.   On: 09/24/2019 09:43   DG Chest Port 1 View  Result Date: 09/23/2019 CLINICAL DATA:  Left effusion status post thoracentesis EXAM: PORTABLE CHEST 1 VIEW COMPARISON:  09/19/2019 FINDINGS: Improvement in the left effusion following thoracentesis. No pneumothorax. Small pleural effusions remain bilaterally. Slight improvement in the left lung aeration but persistent left basilar  collapse/consolidation remains. Stable heart size and vascularity. Aorta atherosclerotic. NG tube enters the stomach with the tip not visualized. Degenerative changes of the spine and shoulders. Postop changes in the left breast and axilla. IMPRESSION: Improvement in the left effusion following thoracentesis. No pneumothorax. Electronically Signed   By: Jerilynn Mages.  Shick M.D.   On: 09/23/2019 13:12   DG Shoulder Left  Result Date: 10/07/2019 CLINICAL DATA:  Shoulder pain EXAM: LEFT SHOULDER - 2+ VIEW COMPARISON:  Chest x-ray 08/31/2015 FINDINGS: Severe degenerative changes of the left glenohumeral joint with complete joint space loss and remodeling of the bony glenoid. Humeral head is high-riding with remodeled appearance of the distal acromion compatible with chronic complete underlying rotator cuff tear. Abnormal rotation of the scapula is unchanged compared to the remote prior studies. No fractures identified. No definite dislocation is appreciated on the provided views. Bony structures are diffusely demineralized. Surgical clips in the  left axilla. No regional soft tissue swelling. IMPRESSION: Chronic severe degenerative changes of the left shoulder with remodeling of the glenoid and acromion. Humeral head is high-riding without evidence of dislocation on the provided views. Electronically Signed   By: Davina Poke D.O.   On: 10/07/2019 16:19   DG Abd Portable 1V  Result Date: 10/13/2019 CLINICAL DATA:  NG tube advancement, third attempt. EXAM: PORTABLE ABDOMEN - 1 VIEW COMPARISON:  Radiographs earlier this day, CT 10/12/2019 FINDINGS: Tip of the enteric tube below the diaphragm in the region of enteric sutures, side-port is at the gastroesophageal junction. Enteric sutures in the upper abdomen. No bowel dilatation to suggest obstruction. IMPRESSION: Tip of the enteric tube below the diaphragm, abutting enteric sutures, likely at the gastrojejunostomy junction given history of gastric bypass. Side port is in  the region of the gastroesophageal junction. Electronically Signed   By: Keith Rake M.D.   On: 10/13/2019 19:05   DG Abd Portable 1V  Result Date: 10/13/2019 CLINICAL DATA:  NG tube advancement. EXAM: PORTABLE ABDOMEN - 1 VIEW COMPARISON:  October 13, 2019 FINDINGS: A nasogastric tube is seen with its distal tip noted just beyond the gastroesophageal junction. The bowel gas pattern is normal. No radio-opaque calculi or other significant radiographic abnormality are seen. Radiopaque surgical clips are seen overlying the right upper quadrant with additional surgical sutures and surgical clips seen along the lateral aspect of the left upper quadrant. IMPRESSION: Nasogastric tube positioning, as described above. Further advancement of the NG tube by approximately 6 cm is recommended to decrease the risk of aspiration. Electronically Signed   By: Virgina Norfolk M.D.   On: 10/13/2019 16:51   DG Abd Portable 1V  Result Date: 10/07/2019 CLINICAL DATA:  Nasogastric tube position. EXAM: PORTABLE ABDOMEN - 1 VIEW COMPARISON:  September 24, 2019. FINDINGS: The bowel gas pattern is normal. Distal tip of nasogastric tube is seen in the stomach. Status post cholecystectomy. Anastomotic sutures are noted in the left upper quadrant. No radio-opaque calculi or other significant radiographic abnormality are seen. IMPRESSION: Distal tip of nasogastric tube seen in the stomach. No evidence of bowel obstruction or ileus. Electronically Signed   By: Marijo Conception M.D.   On: 10/07/2019 09:50   DG Swallowing Func-Speech Pathology  Result Date: 10/13/2019 Objective Swallowing Evaluation: Type of Study: MBS-Modified Barium Swallow Study  Patient Details Name: Attie Nawabi MRN: [id number] Date of Birth: 1951/06/06 Today's Date: 10/13/2019 Time: SLP Start Time (ACUTE ONLY): 1145 -SLP Stop Time (ACUTE ONLY): 1210 SLP Time Calculation (min) (ACUTE ONLY): 25 min Past Medical History: Past Medical History: Diagnosis Date . Anemia   . Anemia  . Anxiety disorder  . Blood transfusion without reported diagnosis  . Brain cancer [facility])   s/p sterotactic radio surgery . Brain tumor ([place])  . Breast cancer ([place])  . Cancer ([place]) 1990  L breast . H/O bone marrow transplant ([place])  . Hypothyroidism  . IBS (irritable bowel syndrome)  . MDD (major depressive disorder)  . Memory loss  . Obesity  . Orthostatic hypotension  . Seizure ([place])  . Seizures ([place])  . Stroke [facility]) 2005  hemorrhagic . Stroke ([place])  . Subdural hematoma ([place])  . Upper brachial plexus paralysis syndrome  . Urinary incontinence  Past Surgical History: Past Surgical History: Procedure Laterality Date . BREAST LUMPECTOMY Left   post chemotherapy and radiation . CHOLECYSTECTOMY   . CRANIOTOMY FOR HEMISPHERECTOMY TOTAL / PARTIAL Right  . GASTRIC BYPASS  1985 . HAND SURGERY  Left 2015 . HAND TENDON SURGERY Left   for brachial plexus injury . LYMPH NODE BIOPSY   HPI: Ms. Keatyn Luck, 68y/f, presented to ER after 1 week of cough/congestion, poor intake, AMS and weakness. PMH of Breast CA (early 1990's), sundowning/dementia w/c bound x 3 years post CVA, anemia, anxiety disorder, Brain tumor/cancer, hypothyroidism, IBS< Memory loss, hypotension, seizures, upper brachial plexus paralysis syndrome. Prior swallow evaluations 2016 and 2018 both finding normal oropharyngeal swallow function.  Clinical swallow eval on 09/21/2019 done with recommendations for npo x ice chips after oral care.  Pt had NG tube in place at that time.  Xerostomia, poor oral movement of ice chip noted - cough after swallow of ice strong enought to propel secretions into oral cavity to allow suction.  Pt later developed respiratory issues and required intubation (oral intubation) 1/3-.10/07/2019.  Concern for mucus plugging and aspiration noted - most recent CXR with lower lung improving but LUL airspace dx present.   Per [facility], events occured as follows:  " 12/28 thora centesis, 1/2 thora centesis, 1/3 intubated and transferred  to [place] started vanc for MRSA sputum1/11 not weaning well1/13 more awake weaning x 2 hrs->spoke w/ family still want full support1/14 acceptable rapid shallow. Following commands, extubated.  1/15 looks better ready for floor. PCCM sign off. 1/18 Rapid response overnight, suspected ongoing aspiration, copious thick secretions, poor mentation. PCCM re-engaged , back on vancomycin and Unasyn." NG out 1/19 - swallow eval ordered.  Family desires full code for this pt per notes.  She has been receiving chest PT, WBC elevated to 11.7.  Plan was for pt to received PEG but pt verbalized she did not desire this and MBS ordered to determine if pt could tolerate po.  Subjective: alert, inconsistenly follows commands Assessment / Plan / Recommendation CHL IP CLINICAL IMPRESSIONS 10/13/2019 Clinical Impression Suboptimal view due to pt's kyphosis and side lying position but tested to best ability.   Patient present with moderately severe oropharyngeal dysphagia with sensorimotor deficits. Lingual pumping with premature spillage and impaired bolus cohesion due to pt's weakness/disoordination.  With delays she often reflexively swallows to help clear oral residuals but at times requires cues to swallow.  Pt penetration and aspirated with thin and nectar consistencies due both during swallow *due to decreased laryngeal closure* and after *pyriform sinus residuals spilling into open airway (with further swallow to clear oral residual).  Aspiration at times was silent *trace amount* and other times with reflexive cough *larger amount* mixed with secretions. Reflexive cough did not clear aspirates and was very weak.  Pharyngeal retention noted without pt adequate sensation.  Cued "hock" did not clear pharyngeal retention. Chin tuck attemptd under flouro but pt unable to perform adequately. Due to pt's level of dysphagia and gross weakness, she will likely aspirate with po provided.  However also suspect she is aspirating secretions  based on secretion retention on MBS.  If family, pt and MD desire for pt to consume po with aspiration mitigation, recommend to consider nectar thick liquids (Ensures, Glucerna, etc) - would advise against anything of increased viscocity.  Tsps of water after mouth care advised to enhance oral hygeine and pt comfort.  Being able to optain nutritional support via po alone will be very challenging for this pt due to her level of dysphagia.   SLP Visit Diagnosis Dysphagia, oropharyngeal phase (R13.12) Attention and concentration deficit following -- Frontal lobe and executive function deficit following -- Impact on safety and function Risk for inadequate nutrition/hydration;Severe aspiration  risk   CHL IP TREATMENT RECOMMENDATION 10/13/2019 Treatment Recommendations Therapy as outlined in treatment plan below   Prognosis 10/13/2019 Prognosis for Safe Diet Advancement Fair Barriers to Reach Goals Cognitive deficits;Severity of deficits;Time post onset Barriers/Prognosis Comment -- CHL IP DIET RECOMMENDATION 10/13/2019 SLP Diet Recommendations (No Data) Liquid Administration via -- Medication Administration Via alternative means Compensations Slow rate;Small sips/bites;Multiple dry swallows after each bite/sip Postural Changes Seated upright at 90 degrees;Remain semi-upright after after feeds/meals (Comment)   CHL IP OTHER RECOMMENDATIONS 10/13/2019 Recommended Consults -- Oral Care Recommendations Oral care QID Other Recommendations --   CHL IP FOLLOW UP RECOMMENDATIONS 10/13/2019 Follow up Recommendations (No Data)   CHL IP FREQUENCY AND DURATION 10/13/2019 Speech Therapy Frequency (ACUTE ONLY) min 2x/week Treatment Duration 2 weeks      CHL IP ORAL PHASE 10/13/2019 Oral Phase Impaired Oral - Pudding Teaspoon -- Oral - Pudding Cup -- Oral - Honey Teaspoon Lingual pumping;Reduced posterior propulsion;Lingual/palatal residue;Delayed oral transit;Weak lingual manipulation;Decreased bolus cohesion;Premature spillage Oral - Honey  Cup -- Oral - Nectar Teaspoon Lingual pumping;Reduced posterior propulsion;Weak lingual manipulation;Delayed oral transit;Lingual/palatal residue;Decreased bolus cohesion;Premature spillage Oral - Nectar Cup -- Oral - Nectar Straw Lingual pumping;Lingual/palatal residue;Reduced posterior propulsion;Weak lingual manipulation;Delayed oral transit;Premature spillage;Decreased bolus cohesion Oral - Thin Teaspoon Lingual pumping;Premature spillage;Lingual/palatal residue;Reduced posterior propulsion;Weak lingual manipulation;Delayed oral transit;Decreased bolus cohesion Oral - Thin Cup -- Oral - Thin Straw Lingual pumping;Premature spillage;Lingual/palatal residue;Reduced posterior propulsion;Weak lingual manipulation;Delayed oral transit;Decreased bolus cohesion Oral - Puree Lingual pumping;Premature spillage;Lingual/palatal residue;Reduced posterior propulsion;Weak lingual manipulation;Delayed oral transit;Decreased bolus cohesion Oral - Mech Soft -- Oral - Regular -- Oral - Multi-Consistency -- Oral - Pill -- Oral Phase - Comment --  CHL IP PHARYNGEAL PHASE 10/13/2019 Pharyngeal Phase Impaired Pharyngeal- Pudding Teaspoon -- Pharyngeal -- Pharyngeal- Pudding Cup -- Pharyngeal -- Pharyngeal- Honey Teaspoon Delayed swallow initiation-vallecula;Reduced epiglottic inversion;Penetration/Aspiration during swallow;Pharyngeal residue - pyriform;Pharyngeal residue - valleculae Pharyngeal Material enters airway, remains ABOVE vocal cords and not ejected out Pharyngeal- Honey Cup -- Pharyngeal -- Pharyngeal- Nectar Teaspoon Delayed swallow initiation-pyriform sinuses;Pharyngeal residue - pyriform;Penetration/Aspiration during swallow;Pharyngeal residue - valleculae;Reduced laryngeal elevation;Reduced airway/laryngeal closure Pharyngeal Material enters airway, passes BELOW cords and not ejected out despite cough attempt by patient;Material enters airway, passes BELOW cords without attempt by patient to eject out (silent  aspiration) Pharyngeal- Nectar Cup -- Pharyngeal -- Pharyngeal- Nectar Straw Delayed swallow initiation-pyriform sinuses;Delayed swallow initiation-vallecula;Pharyngeal residue - valleculae;Pharyngeal residue - pyriform;Reduced laryngeal elevation;Reduced airway/laryngeal closure Pharyngeal Material enters airway, passes BELOW cords without attempt by patient to eject out (silent aspiration);Material enters airway, passes BELOW cords and not ejected out despite cough attempt by patient Pharyngeal- Thin Teaspoon Penetration/Aspiration during swallow;Reduced airway/laryngeal closure;Reduced laryngeal elevation;Delayed swallow initiation-pyriform sinuses;Pharyngeal residue - valleculae;Pharyngeal residue - pyriform Pharyngeal Material enters airway, passes BELOW cords and not ejected out despite cough attempt by patient Pharyngeal- Thin Cup -- Pharyngeal -- Pharyngeal- Thin Straw Penetration/Aspiration during swallow;Reduced laryngeal elevation;Delayed swallow initiation-pyriform sinuses;Pharyngeal residue - pyriform Pharyngeal Material enters airway, passes BELOW cords and not ejected out despite cough attempt by patient Pharyngeal- Puree Delayed swallow initiation-vallecula;Reduced laryngeal elevation;Reduced tongue base retraction;Pharyngeal residue - valleculae;Pharyngeal residue - pyriform Pharyngeal Material does not enter airway Pharyngeal- Mechanical Soft -- Pharyngeal -- Pharyngeal- Regular -- Pharyngeal -- Pharyngeal- Multi-consistency -- Pharyngeal -- Pharyngeal- Pill -- Pharyngeal -- Pharyngeal Comment --  CHL IP CERVICAL ESOPHAGEAL PHASE 10/13/2019 Cervical Esophageal Phase (No Data) Pudding Teaspoon -- Pudding Cup -- Honey Teaspoon -- Honey Cup -- Nectar Teaspoon -- Nectar Cup -- Nectar Straw -- Thin Teaspoon --  Thin Cup -- Thin Straw -- Puree -- Mechanical Soft -- Regular -- Multi-consistency -- Pill -- Cervical Esophageal Comment -- [name] 10/13/2019, 1:06 PM    Kathleen Lime, MS [facility] SLP Acute  Rehab Services Office [phone]           ECHOCARDIOGRAM COMPLETE  Result Date: 09/24/2019   ECHOCARDIOGRAM REPORT   Patient Name:   ANTONELA FREIMAN Date of Exam: 09/24/2019 Medical Rec #:  [id number]      Height:       68.0 in Accession #:    [id number]     Weight:       132.3 lb Date of Birth:  [birth year]      BSA:          1.71 m Patient Age:    61 years       BP:           102/42 mmHg Patient Gender: F              HR:           61 bpm. Exam Location:  Inpatient Procedure: 2D Echo, Cardiac Doppler, Color Doppler and Intracardiac            Opacification Agent Indications:    CHF-Acute Diastolic 858.85  History:        Patient has prior history of Echocardiogram examinations, most                 recent 09/01/2015. Stroke and TIA; Risk Factors:Non-Smoker.                 Pleural effusion.  Sonographer:    Paulita Fujita RDCS Referring Phys: [place]  Sonographer Comments: Echo performed with patient supine and on artificial respirator. IMPRESSIONS  1. Left ventricular ejection fraction, by visual estimation, is 25 to 30%. The left ventricle has severely decreased function. There is no left ventricular hypertrophy.  2. Definity contrast agent was given IV to delineate the left ventricular endocardial borders.  3. Left ventricular diastolic parameters are consistent with Grade II diastolic dysfunction (pseudonormalization).  4. The left ventricle demonstrates regional wall motion abnormalities.  5. Global right ventricle has severely reduced systolic function.The right ventricular size is moderately enlarged. No increase in right ventricular wall thickness.  6. Left atrial size was normal.  7. Right atrial size was normal.  8. Large pleural effusion in the left lateral region.  9. The pericardial effusion is posterior to the left ventricle. 10. Trivial pericardial effusion is present. 11. The mitral valve is normal in structure. Trivial mitral valve regurgitation. No evidence of mitral stenosis. 12.  The tricuspid valve is normal in structure. 13. The aortic valve is normal in structure. Aortic valve regurgitation is not visualized. No evidence of aortic valve sclerosis or stenosis. 14. The pulmonic valve was normal in structure. Pulmonic valve regurgitation is not visualized. 15. Normal pulmonary artery systolic pressure. 16. The tricuspid regurgitant velocity is 1.75 m/s, and with an assumed right atrial pressure of 15 mmHg, the estimated right ventricular systolic pressure is normal at 27.2 mmHg. 17. The inferior vena cava is normal in size with greater than 50% respiratory variability, suggesting right atrial pressure of 3 mmHg. 18. There is akinesis of the basal and mid anteroseptal, anterior, anterolateral walls and aneurysmal dilatation of the apical segments. There is a suspicion for an apical thrombus on the contrast images. Systemic anticoagulation is recommended. Overall LVEF is severely reduced at 25-30%, RVEF is moderately  reduced. FINDINGS  Left Ventricle: Left ventricular ejection fraction, by visual estimation, is 25 to 30%. The left ventricle has severely decreased function. Definity contrast agent was given IV to delineate the left ventricular endocardial borders. The left ventricle demonstrates regional wall motion abnormalities. There is no left ventricular hypertrophy. Left ventricular diastolic parameters are consistent with Grade II diastolic dysfunction (pseudonormalization). Normal left atrial pressure. Right Ventricle: The right ventricular size is moderately enlarged. No increase in right ventricular wall thickness. Global RV systolic function is has severely reduced systolic function. The tricuspid regurgitant velocity is 1.75 m/s, and with an assumed right atrial pressure of 15 mmHg, the estimated right ventricular systolic pressure is normal at 27.2 mmHg. Left Atrium: Left atrial size was normal in size. Right Atrium: Right atrial size was normal in size Pericardium: Trivial  pericardial effusion is present. The pericardial effusion is posterior to the left ventricle. There is a large pleural effusion in the left lateral region. Mitral Valve: The mitral valve is normal in structure. Trivial mitral valve regurgitation. No evidence of mitral valve stenosis by observation. Tricuspid Valve: The tricuspid valve is normal in structure. Tricuspid valve regurgitation is mild. Aortic Valve: The aortic valve is normal in structure. Aortic valve regurgitation is not visualized. The aortic valve is structurally normal, with no evidence of sclerosis or stenosis. Pulmonic Valve: The pulmonic valve was normal in structure. Pulmonic valve regurgitation is not visualized. Pulmonic regurgitation is not visualized. Aorta: The aortic root, ascending aorta and aortic arch are all structurally normal, with no evidence of dilitation or obstruction. Venous: The inferior vena cava is normal in size with greater than 50% respiratory variability, suggesting right atrial pressure of 3 mmHg. IAS/Shunts: No atrial level shunt detected by color flow Doppler. There is no evidence of a patent foramen ovale. No ventricular septal defect is seen or detected. There is no evidence of an atrial septal defect.  LEFT VENTRICLE PLAX 2D LVIDd:         4.80 cm  Diastology LVIDs:         4.10 cm  LV e' lateral:   6.32 cm/s LV PW:         0.70 cm  LV E/e' lateral: 7.7 LV IVS:        0.70 cm  LV e' medial:    4.38 cm/s LVOT diam:     1.90 cm  LV E/e' medial:  11.1 LV SV:         33 ml LV SV Index:   19.69 LVOT Area:     2.84 cm  RIGHT VENTRICLE RV S prime:     8.60 cm/s TAPSE (M-mode): 1.8 cm LEFT ATRIUM           Index       RIGHT ATRIUM           Index LA diam:      2.90 cm 1.69 cm/m  RA Area:     11.40 cm LA Vol (A4C): 30.7 ml 17.91 ml/m RA Volume:   24.60 ml  14.35 ml/m  AORTIC VALVE LVOT Vmax:   78.30 cm/s LVOT Vmean:  50.200 cm/s LVOT VTI:    0.153 m  AORTA Ao Root diam: 2.70 cm MITRAL VALVE                         TRICUSPID VALVE MV Area (PHT): 3.17 cm             TR Peak grad:  12.2 mmHg MV PHT:        69.31 msec           TR Vmax:        175.00 cm/s MV Decel Time: 239 msec MV E velocity: 48.40 cm/s 103 cm/s  SHUNTS MV A velocity: 39.40 cm/s 70.3 cm/s Systemic VTI:  0.15 m MV E/A ratio:  1.23       1.5       Systemic Diam: 1.90 cm  Ena Dawley MD Electronically signed by Ena Dawley MD Signature Date/Time: 09/24/2019/3:32:36 PM    Final    VAS [place] UPPER EXTREMITY VENOUS DUPLEX  Result Date: 09/27/2019 UPPER VENOUS STUDY  Indications: concern for svc syndrome Comparison Study: no prior Performing Technologist: Abram Sander RVS  Examination Guidelines: A complete evaluation includes B-mode imaging, spectral Doppler, color Doppler, and power Doppler as needed of all accessible portions of each vessel. Bilateral testing is considered an integral part of a complete examination. Limited examinations for reoccurring indications may be performed as noted.  Right Findings: +----------+------------+---------+-----------+----------+--------------+ RIGHT     CompressiblePhasicitySpontaneousProperties   Summary     +----------+------------+---------+-----------+----------+--------------+ IJV           Full       Yes       Yes                             +----------+------------+---------+-----------+----------+--------------+ Subclavian    Full       Yes       Yes                             +----------+------------+---------+-----------+----------+--------------+ Axillary      Full       Yes       Yes                             +----------+------------+---------+-----------+----------+--------------+ Brachial      Full       Yes       Yes                             +----------+------------+---------+-----------+----------+--------------+ Radial        Full                                                 +----------+------------+---------+-----------+----------+--------------+ Ulnar          Full                                                 +----------+------------+---------+-----------+----------+--------------+ Cephalic                                            Not visualized +----------+------------+---------+-----------+----------+--------------+ Basilic  Not visualized +----------+------------+---------+-----------+----------+--------------+  Left Findings: +----------+------------+---------+-----------+----------+--------------+ LEFT      CompressiblePhasicitySpontaneousProperties   Summary     +----------+------------+---------+-----------+----------+--------------+ IJV           Full       Yes       Yes                             +----------+------------+---------+-----------+----------+--------------+ Subclavian    Full       Yes       Yes                             +----------+------------+---------+-----------+----------+--------------+ Axillary      Full       Yes       Yes                             +----------+------------+---------+-----------+----------+--------------+ Brachial      Full       Yes       Yes                             +----------+------------+---------+-----------+----------+--------------+ Radial        Full                                                 +----------+------------+---------+-----------+----------+--------------+ Ulnar         Full                                                 +----------+------------+---------+-----------+----------+--------------+ Cephalic      Full                                                 +----------+------------+---------+-----------+----------+--------------+ Basilic                                             Not visualized +----------+------------+---------+-----------+----------+--------------+  Summary:  Right: No evidence of deep vein thrombosis in the upper extremity.  Left: No  evidence of deep vein thrombosis in the upper extremity. No evidence of superficial vein thrombosis in the upper extremity.  *See table(s) above for measurements and observations.  Diagnosing physician: Curt Jews MD Electronically signed by Curt Jews MD on 09/27/2019 at 4:42:59 PM.    Final    ECHOCARDIOGRAM LIMITED  Result Date: 10/19/2019   ECHOCARDIOGRAM LIMITED REPORT   Patient Name:   ADHYA COCCO Date of Exam: 10/19/2019 Medical Rec #:  [id number]      Height:       68.0 in Accession #:    [id number]     Weight:       129.4 lb Date of Birth:  Sep 10, 1951      BSA:          1.70 m Patient Age:  68 years       BP:           102/77 mmHg Patient Gender: F              HR:           74 bpm. Exam Location:  Inpatient  Procedure: Limited Echo and Intracardiac Opacification Agent Indications:    Cardiomyopathy-Unspecified 425.9  History:        Patient has prior history of Echocardiogram examinations, most                 recent 09/24/2019. Pleural effusion.  Sonographer:    Paulita Fujita RDCS Referring Phys: [id number] [place]  1. Left ventricular ejection fraction, by visual estimation, is 45 to 50%. Left ventricular septal wall thickness was normal. Normal left ventricular posterior wall thickness. There is no increased left ventricular wall thickness.  2. Left ventricular diastolic function could not be evaluated.  3. Right ventricular volume/pressure overload.  4. The left ventricle demonstrates regional wall motion abnormalities.  5. Apical akinesis. Systolic function has improved from 25-30% 09/24/19.  6. The mitral valve is grossly normal.  7. The tricuspid valve was not assessed. FINDINGS  Left Ventricle: Left ventricular ejection fraction, by visual estimation, is 45 to 50%. The left ventricle demonstrates regional wall motion abnormalities. Left ventricular septal wall thickness was normal. Normal left ventricular posterior wall thickness. There is no increased left ventricular wall  thickness. Abnormal (paradoxical) septal motion, consistent with right ventricular volume overload and/or elevated RV end-diastolic pressure. Left ventricular diastolic function could not be evaluated. Apical akinesis. Systolic function has improved from 25-30% 09/24/19. Left Atrium: Left atrial size was normal in size. Right Atrium: Right atrial size was normal in size. Mitral Valve: The mitral valve is grossly normal. Tricuspid Valve: The tricuspid valve is not assessed. Aortic Valve: The aortic valve is grossly normal. Pulmonic Valve: The pulmonic valve was not assessed. Aorta: The aortic root, ascending aorta, aortic arch and descending aorta are all structurally normal, with no evidence of dilitation or obstruction.  Skeet Latch MD Electronically signed by Skeet Latch MD Signature Date/Time: 10/19/2019/6:50:12 PMThe mitral valve is grossly normal.    Final    [place] EKG SITE RITE  Result Date: 09/28/2019 If Site Rite image not attached, placement could not be confirmed due to current cardiac rhythm.  US THORACENTESIS ASP PLEURAL SPACE W/IMG GUIDE  Result Date: 09/23/2019 INDICATION: Pleural effusion, severe pneumonia EXAM: ULTRASOUND GUIDED LEFT THORACENTESIS MEDICATIONS: 1% lidocaine local COMPLICATIONS: None immediate. PROCEDURE: An ultrasound guided thoracentesis was thoroughly discussed with the patient's husband and questions answered. The benefits, risks, alternatives and complications were also discussed. The patient understands and wishes to proceed with the procedure. Written consent was obtained. Ultrasound was performed to localize and mark an adequate pocket of fluid in the left chest. The area was then prepped and draped in the normal sterile fashion. 1% Lidocaine was used for local anesthesia. Under ultrasound guidance a 6 Fr Safe-T-Centesis catheter was introduced. Thoracentesis was performed. The catheter was removed and a dressing applied. FINDINGS: A total of approximately 820 cc of  yellow serosanguineous pleural fluid was removed. Samples were sent to the laboratory as requested by the clinical team. IMPRESSION: Successful ultrasound guided left thoracentesis yielding 820 cc of pleural fluid. Electronically Signed   By: Jerilynn Mages.  Shick M.D.   On: 09/23/2019 12:47     Subjective: Patient seen and examined.  No overnight events.  She was not very interactive,  however when she asked about going home she became very happy. Reviewed all discharge instructions with husband at the bedside. Family is eagerly waiting to take her home.   Discharge Exam: Vitals:   10/21/19 0400 10/21/19 0800  BP: 119/87   Pulse: 64   Resp: 14   Temp:  97.6 F (36.4 C)  SpO2: 99%    Vitals:   10/21/19 0300 10/21/19 0400 10/21/19 0500 10/21/19 0800  BP:  119/87    Pulse:  64    Resp:  14    Temp: 98.7 F (37.1 C)   97.6 F (36.4 C)  TempSrc: Axillary   Axillary  SpO2:  99%    Weight:   57.5 kg   Height:        General: Pt is alert and awake , oriented x1.  Not in any distress.  Chronically sick looking and frail.  Cardiovascular: RRR, S1/S2 +, no rubs, no gallops Respiratory: CTA bilaterally, no wheezing, no rhonchi, some conducted airway sounds. Abdominal: Soft, NT, ND, bowel sounds +, G-tube in place and infusing tube feeding well. Extremities: Lower extremity contractures not moving.  Right upper extremity normal.  Left upper extremity with contracture and decreased range of motion.    The results of significant diagnostics from this hospitalization (including imaging, microbiology, ancillary and laboratory) are listed below for reference.     Microbiology: No results found for this or any previous visit (from the past 240 hour(s)).   Labs: BNP (last 3 results) Recent Labs    09/16/19 1600  BNP 803.2*   Basic Metabolic Panel: Recent Labs  Lab 10/15/19 0220 10/15/19 0220 10/16/19 0228 10/17/19 0248 10/19/19 0507 10/20/19 0231 10/21/19 0146  NA 139   < > 138 139  139 139 138  K 4.7   < > 4.7 4.9 4.5 4.0 4.2  CL 102   < > 100 97* 100 101 102  CO2 28   < > 28 32 _0 GLUCOSE 130*   < > 146* 101* 141* 120* 129*  BUN 29*   < > 34* 37* 47* 43* 32*  CREATININE 0.54   < > 0.62 0.59 0.80 0.57 0.38*  CALCIUM 8.2*   < > 8.3* 8.8* 8.7* 8.2* 8.1*  MG 2.1  --   --   --  2.2 1.9  --   PHOS 3.5  --   --   --  4.2 2.5  --    < > = values in this interval not displayed.   Liver Function Tests: Recent Labs  Lab 10/15/19 0220 10/16/19 0228 10/17/19 0248  AST 18 24 32  ALT _1 ALKPHOS 77 72 80  BILITOT 0.6 0.6 0.8  PROT 5.9* 5.9* 6.8  ALBUMIN 2.4* 2.3* 2.7*   No results for input(s): LIPASE, AMYLASE in the last 168 hours. No results for input(s): AMMONIA in the last 168 hours. CBC: Recent Labs  Lab 10/17/19 0248 10/17/19 0248 10/18/19 0158 10/19/19 0507 10/19/19 2205 10/20/19 0231 10/21/19 0146  WBC 14.8*  --  10.5 15.9*  --  12.0* 11.3*  NEUTROABS 10.9*  --  7.0 13.5*  --  8.5* 8.1*  HGB 9.8*   < > 9.0* 7.2* 8.3* 8.0* 7.9*  HCT 32.3*   < > 30.4* 24.6* 26.2* 26.1* 26.3*  MCV 97.3  --  97.1 98.8  --  95.3 97.0  PLT 282  --  265 260  --  216 210   < > =  values in this interval not displayed.   Cardiac Enzymes: No results for input(s): CKTOTAL, CKMB, CKMBINDEX, TROPONINI in the last 168 hours. BNP: Invalid input(s): POCBNP CBG: Recent Labs  Lab 10/20/19 1307 10/20/19 1754 10/20/19 1933 10/20/19 2322 10/21/19 0402  GLUCAP 99 84 107* 123* 111*   D-Dimer No results for input(s): DDIMER in the last 72 hours. Hgb A1c No results for input(s): HGBA1C in the last 72 hours. Lipid Profile No results for input(s): CHOL, HDL, LDLCALC, TRIG, CHOLHDL, LDLDIRECT in the last 72 hours. Thyroid function studies Recent Labs    10/20/19 0231  TSH 3.810   Anemia work up No results for input(s): VITAMINB12, FOLATE, FERRITIN, TIBC, IRON, RETICCTPCT in the last 72 hours. Urinalysis    Component Value Date/Time   COLORURINE YELLOW  09/16/2019 1833   APPEARANCEUR CLEAR 09/16/2019 1833   LABSPEC 1.021 09/16/2019 1833   PHURINE 5.0 09/16/2019 1833   GLUCOSEU NEGATIVE 09/16/2019 1833   HGBUR MODERATE (A) 09/16/2019 1833   BILIRUBINUR NEGATIVE 09/16/2019 1833   KETONESUR 5 (A) 09/16/2019 1833   PROTEINUR NEGATIVE 09/16/2019 1833   NITRITE NEGATIVE 09/16/2019 1833   LEUKOCYTESUR NEGATIVE 09/16/2019 1833   Sepsis Labs Invalid input(s): PROCALCITONIN,  WBC,  LACTICIDVEN Microbiology No results found for this or any previous visit (from the past 240 hour(s)).   Time coordinating discharge: 60 minutes  SIGNED:   [name], MD  [facility] 10/21/2019, 11:35 AM

## 2019-10-21 NOTE — Progress Notes (Signed)
Barbara Flowers 1224 d/c home by PTAR. Her husband was given d/c instructions and left to meet patient at home. The Education officer, museum and MD talked with him also. IV d/c'd, peg flushed after feeding disconnected.

## 2019-10-24 NOTE — Anesthesia Postprocedure Evaluation (Signed)
Anesthesia Post Note  Patient: Adalina Darcey  Procedure(s) Performed: LAPAROSCOPIC GASTROSTOMY (N/A ) LAPAROSCOPIC LYSIS OF ADHESIONS (N/A Abdomen) LAPAROSCOPIC INCISIONAL HERNIA REPAIR (N/A Abdomen)     Patient location during evaluation: PACU Anesthesia Type: General Level of consciousness: awake and alert Pain management: pain level controlled Vital Signs Assessment: post-procedure vital signs reviewed and stable Respiratory status: spontaneous breathing, nonlabored ventilation, respiratory function stable and patient connected to nasal cannula oxygen Cardiovascular status: blood pressure returned to baseline and stable Postop Assessment: no apparent nausea or vomiting Anesthetic complications: no    Last Vitals:  Vitals:   10/21/19 1400 10/21/19 1410  BP: (!) 120/34 (!) 130/37  Pulse: 72 70  Resp: 16 17  Temp:    SpO2: 100% 99%    Last Pain:  Vitals:   10/21/19 1200  TempSrc: Oral  PainSc:                  [place] S

## 2019-10-25 ENCOUNTER — Telehealth: Payer: Self-pay | Admitting: Nurse Practitioner

## 2019-10-25 ENCOUNTER — Other Ambulatory Visit: Payer: Self-pay | Admitting: Internal Medicine

## 2019-10-25 ENCOUNTER — Telehealth: Payer: Self-pay | Admitting: *Deleted

## 2019-10-25 DIAGNOSIS — I6930 Unspecified sequelae of cerebral infarction: Secondary | ICD-10-CM

## 2019-10-25 NOTE — Progress Notes (Unsigned)
Hospital bed orders placed.  Patient d/c'd over weekend.   Barbara Flowers 10/25/2019 4:37 PM

## 2019-10-25 NOTE — Telephone Encounter (Signed)
Rep called for DME order for hospital bed.

## 2019-10-25 NOTE — Telephone Encounter (Signed)
CSW spoke to Guy at [facility] which is the agency providing home health for patient.  Barbara Flowers was informed a hospital bed is available for patient now, and she just needed orders.  CSW faxed orders to home health agency.

## 2019-10-25 NOTE — Clinical Social Work Note (Cosign Needed Addendum)
CHF or Chronic Pulmonary Condition  Patient suffers from CHF and has trouble breathing at night when head is elevated less than 30 degrees. Bed wedges do not provide enough elevation to resolve breathing issues.  Breathing difficulties will cause patient to require frequent changes in body's position which cannot be achieved with a normal bed.  Barbara Flowers. Barbara Flowers, MSW, [place]  10/25/2019 4:21 PM

## 2019-10-25 NOTE — TOC Progression Note (Addendum)
Transition of Care [facility]) - Progression Note    Patient Details  Name: Barbara Flowers MRN: [id number] Date of Birth: 26-Jan-1951  Transition of Care [facility]) CM/SW Contact  Ross Ludwig, [place] Phone Number: 10/25/2019, 4:50 PM  Clinical Narrative:     October 25, 2019 4:30pm   Patient discharged over the weekend, CSW was contacted to see if an order for DME order can be placed even though patient discharged over the weekend.  Due to hospital bed shortage over the weekend, DME hospital bed was not available.  CSW spoke to [employer] at [facility] which is the home health agency patient is using and she was informed that [facility] has a bed available for patient now, however she needed orders for patient.  CSW contacted medical director and she was able to complete an order so patient can receive her hospital bed.  CSW faxed order to [facility] at [place], [place] received confirmation at 1647 that fax was successful.      Expected Discharge Plan and Services           Expected Discharge Date: 10/21/19                                     Social Determinants of Health (SDOH) Interventions    Readmission Risk Interventions Readmission Risk Prevention Plan 09/20/2019  Transportation Screening Complete  Home Care Screening Complete  Some recent data might be hidden

## 2019-11-11 ENCOUNTER — Emergency Department (HOSPITAL_COMMUNITY): Payer: Medicare Other

## 2019-11-11 ENCOUNTER — Inpatient Hospital Stay (HOSPITAL_COMMUNITY)
Admission: EM | Admit: 2019-11-11 | Discharge: 2019-12-21 | DRG: 698 | Disposition: E | Payer: Medicare Other | Attending: Internal Medicine | Admitting: Internal Medicine

## 2019-11-11 ENCOUNTER — Encounter (HOSPITAL_COMMUNITY): Payer: Self-pay | Admitting: Emergency Medicine

## 2019-11-11 DIAGNOSIS — C50919 Malignant neoplasm of unspecified site of unspecified female breast: Secondary | ICD-10-CM | POA: Diagnosis present

## 2019-11-11 DIAGNOSIS — F419 Anxiety disorder, unspecified: Secondary | ICD-10-CM | POA: Diagnosis present

## 2019-11-11 DIAGNOSIS — A419 Sepsis, unspecified organism: Secondary | ICD-10-CM

## 2019-11-11 DIAGNOSIS — Y846 Urinary catheterization as the cause of abnormal reaction of the patient, or of later complication, without mention of misadventure at the time of the procedure: Secondary | ICD-10-CM | POA: Diagnosis present

## 2019-11-11 DIAGNOSIS — K219 Gastro-esophageal reflux disease without esophagitis: Secondary | ICD-10-CM | POA: Diagnosis present

## 2019-11-11 DIAGNOSIS — E440 Moderate protein-calorie malnutrition: Secondary | ICD-10-CM | POA: Diagnosis present

## 2019-11-11 DIAGNOSIS — G903 Multi-system degeneration of the autonomic nervous system: Secondary | ICD-10-CM | POA: Diagnosis present

## 2019-11-11 DIAGNOSIS — J9819 Other pulmonary collapse: Secondary | ICD-10-CM | POA: Diagnosis not present

## 2019-11-11 DIAGNOSIS — R791 Abnormal coagulation profile: Secondary | ICD-10-CM | POA: Diagnosis present

## 2019-11-11 DIAGNOSIS — Z22322 Carrier or suspected carrier of Methicillin resistant Staphylococcus aureus: Secondary | ICD-10-CM

## 2019-11-11 DIAGNOSIS — R0902 Hypoxemia: Secondary | ICD-10-CM

## 2019-11-11 DIAGNOSIS — L89890 Pressure ulcer of other site, unstageable: Secondary | ICD-10-CM | POA: Diagnosis present

## 2019-11-11 DIAGNOSIS — D638 Anemia in other chronic diseases classified elsewhere: Secondary | ICD-10-CM | POA: Diagnosis present

## 2019-11-11 DIAGNOSIS — Z85841 Personal history of malignant neoplasm of brain: Secondary | ICD-10-CM

## 2019-11-11 DIAGNOSIS — Z9481 Bone marrow transplant status: Secondary | ICD-10-CM | POA: Diagnosis not present

## 2019-11-11 DIAGNOSIS — F329 Major depressive disorder, single episode, unspecified: Secondary | ICD-10-CM | POA: Diagnosis present

## 2019-11-11 DIAGNOSIS — T83511A Infection and inflammatory reaction due to indwelling urethral catheter, initial encounter: Secondary | ICD-10-CM | POA: Diagnosis present

## 2019-11-11 DIAGNOSIS — J9601 Acute respiratory failure with hypoxia: Secondary | ICD-10-CM | POA: Diagnosis not present

## 2019-11-11 DIAGNOSIS — Z978 Presence of other specified devices: Secondary | ICD-10-CM

## 2019-11-11 DIAGNOSIS — Z9010 Acquired absence of unspecified breast and nipple: Secondary | ICD-10-CM

## 2019-11-11 DIAGNOSIS — J9600 Acute respiratory failure, unspecified whether with hypoxia or hypercapnia: Secondary | ICD-10-CM | POA: Diagnosis not present

## 2019-11-11 DIAGNOSIS — N170 Acute kidney failure with tubular necrosis: Secondary | ICD-10-CM | POA: Diagnosis present

## 2019-11-11 DIAGNOSIS — M419 Scoliosis, unspecified: Secondary | ICD-10-CM | POA: Diagnosis present

## 2019-11-11 DIAGNOSIS — G9341 Metabolic encephalopathy: Secondary | ICD-10-CM | POA: Diagnosis present

## 2019-11-11 DIAGNOSIS — F0150 Vascular dementia without behavioral disturbance: Secondary | ICD-10-CM | POA: Diagnosis present

## 2019-11-11 DIAGNOSIS — N320 Bladder-neck obstruction: Secondary | ICD-10-CM | POA: Diagnosis present

## 2019-11-11 DIAGNOSIS — R0603 Acute respiratory distress: Secondary | ICD-10-CM | POA: Diagnosis not present

## 2019-11-11 DIAGNOSIS — Z993 Dependence on wheelchair: Secondary | ICD-10-CM

## 2019-11-11 DIAGNOSIS — Z888 Allergy status to other drugs, medicaments and biological substances status: Secondary | ICD-10-CM

## 2019-11-11 DIAGNOSIS — M87851 Other osteonecrosis, right femur: Secondary | ICD-10-CM | POA: Diagnosis present

## 2019-11-11 DIAGNOSIS — I6930 Unspecified sequelae of cerebral infarction: Secondary | ICD-10-CM

## 2019-11-11 DIAGNOSIS — I480 Paroxysmal atrial fibrillation: Secondary | ICD-10-CM | POA: Diagnosis not present

## 2019-11-11 DIAGNOSIS — L8990 Pressure ulcer of unspecified site, unspecified stage: Secondary | ICD-10-CM | POA: Diagnosis present

## 2019-11-11 DIAGNOSIS — Z9221 Personal history of antineoplastic chemotherapy: Secondary | ICD-10-CM

## 2019-11-11 DIAGNOSIS — I69391 Dysphagia following cerebral infarction: Secondary | ICD-10-CM

## 2019-11-11 DIAGNOSIS — Z681 Body mass index (BMI) 19 or less, adult: Secondary | ICD-10-CM | POA: Diagnosis not present

## 2019-11-11 DIAGNOSIS — Z79899 Other long term (current) drug therapy: Secondary | ICD-10-CM

## 2019-11-11 DIAGNOSIS — Z823 Family history of stroke: Secondary | ICD-10-CM

## 2019-11-11 DIAGNOSIS — Z9049 Acquired absence of other specified parts of digestive tract: Secondary | ICD-10-CM

## 2019-11-11 DIAGNOSIS — J15212 Pneumonia due to Methicillin resistant Staphylococcus aureus: Secondary | ICD-10-CM | POA: Diagnosis present

## 2019-11-11 DIAGNOSIS — G8190 Hemiplegia, unspecified affecting unspecified side: Secondary | ICD-10-CM

## 2019-11-11 DIAGNOSIS — E870 Hyperosmolality and hypernatremia: Secondary | ICD-10-CM | POA: Diagnosis not present

## 2019-11-11 DIAGNOSIS — Z931 Gastrostomy status: Secondary | ICD-10-CM

## 2019-11-11 DIAGNOSIS — R531 Weakness: Secondary | ICD-10-CM | POA: Diagnosis not present

## 2019-11-11 DIAGNOSIS — R4182 Altered mental status, unspecified: Secondary | ICD-10-CM | POA: Diagnosis not present

## 2019-11-11 DIAGNOSIS — Z515 Encounter for palliative care: Secondary | ICD-10-CM | POA: Diagnosis not present

## 2019-11-11 DIAGNOSIS — I69354 Hemiplegia and hemiparesis following cerebral infarction affecting left non-dominant side: Secondary | ICD-10-CM | POA: Diagnosis not present

## 2019-11-11 DIAGNOSIS — I5022 Chronic systolic (congestive) heart failure: Secondary | ICD-10-CM | POA: Diagnosis present

## 2019-11-11 DIAGNOSIS — I951 Orthostatic hypotension: Secondary | ICD-10-CM | POA: Diagnosis present

## 2019-11-11 DIAGNOSIS — B952 Enterococcus as the cause of diseases classified elsewhere: Secondary | ICD-10-CM | POA: Diagnosis present

## 2019-11-11 DIAGNOSIS — R6520 Severe sepsis without septic shock: Secondary | ICD-10-CM | POA: Diagnosis not present

## 2019-11-11 DIAGNOSIS — T17890A Other foreign object in other parts of respiratory tract causing asphyxiation, initial encounter: Secondary | ICD-10-CM | POA: Diagnosis not present

## 2019-11-11 DIAGNOSIS — E43 Unspecified severe protein-calorie malnutrition: Secondary | ICD-10-CM | POA: Diagnosis present

## 2019-11-11 DIAGNOSIS — Z7982 Long term (current) use of aspirin: Secondary | ICD-10-CM

## 2019-11-11 DIAGNOSIS — R159 Full incontinence of feces: Secondary | ICD-10-CM | POA: Diagnosis not present

## 2019-11-11 DIAGNOSIS — E869 Volume depletion, unspecified: Secondary | ICD-10-CM | POA: Diagnosis present

## 2019-11-11 DIAGNOSIS — N390 Urinary tract infection, site not specified: Secondary | ICD-10-CM | POA: Diagnosis present

## 2019-11-11 DIAGNOSIS — R001 Bradycardia, unspecified: Secondary | ICD-10-CM | POA: Diagnosis not present

## 2019-11-11 DIAGNOSIS — R627 Adult failure to thrive: Secondary | ICD-10-CM | POA: Diagnosis not present

## 2019-11-11 DIAGNOSIS — R6521 Severe sepsis with septic shock: Secondary | ICD-10-CM | POA: Diagnosis present

## 2019-11-11 DIAGNOSIS — I110 Hypertensive heart disease with heart failure: Secondary | ICD-10-CM | POA: Diagnosis present

## 2019-11-11 DIAGNOSIS — R5381 Other malaise: Secondary | ICD-10-CM | POA: Diagnosis present

## 2019-11-11 DIAGNOSIS — I671 Cerebral aneurysm, nonruptured: Secondary | ICD-10-CM | POA: Diagnosis present

## 2019-11-11 DIAGNOSIS — J9602 Acute respiratory failure with hypercapnia: Secondary | ICD-10-CM | POA: Diagnosis not present

## 2019-11-11 DIAGNOSIS — R64 Cachexia: Secondary | ICD-10-CM | POA: Diagnosis present

## 2019-11-11 DIAGNOSIS — R339 Retention of urine, unspecified: Secondary | ICD-10-CM | POA: Diagnosis present

## 2019-11-11 DIAGNOSIS — B9620 Unspecified Escherichia coli [E. coli] as the cause of diseases classified elsewhere: Secondary | ICD-10-CM | POA: Diagnosis present

## 2019-11-11 DIAGNOSIS — R1310 Dysphagia, unspecified: Secondary | ICD-10-CM | POA: Diagnosis not present

## 2019-11-11 DIAGNOSIS — R0602 Shortness of breath: Secondary | ICD-10-CM

## 2019-11-11 DIAGNOSIS — G20 Parkinson's disease: Secondary | ICD-10-CM | POA: Diagnosis present

## 2019-11-11 DIAGNOSIS — N179 Acute kidney failure, unspecified: Secondary | ICD-10-CM | POA: Diagnosis not present

## 2019-11-11 DIAGNOSIS — Z20822 Contact with and (suspected) exposure to covid-19: Secondary | ICD-10-CM | POA: Diagnosis present

## 2019-11-11 DIAGNOSIS — I5082 Biventricular heart failure: Secondary | ICD-10-CM | POA: Diagnosis present

## 2019-11-11 DIAGNOSIS — L89154 Pressure ulcer of sacral region, stage 4: Secondary | ICD-10-CM | POA: Diagnosis present

## 2019-11-11 DIAGNOSIS — Z7189 Other specified counseling: Secondary | ICD-10-CM | POA: Diagnosis not present

## 2019-11-11 DIAGNOSIS — R739 Hyperglycemia, unspecified: Secondary | ICD-10-CM | POA: Diagnosis not present

## 2019-11-11 DIAGNOSIS — I513 Intracardiac thrombosis, not elsewhere classified: Secondary | ICD-10-CM

## 2019-11-11 DIAGNOSIS — Z9884 Bariatric surgery status: Secondary | ICD-10-CM

## 2019-11-11 DIAGNOSIS — Z9911 Dependence on respirator [ventilator] status: Secondary | ICD-10-CM | POA: Diagnosis not present

## 2019-11-11 DIAGNOSIS — E875 Hyperkalemia: Secondary | ICD-10-CM

## 2019-11-11 DIAGNOSIS — R451 Restlessness and agitation: Secondary | ICD-10-CM | POA: Diagnosis not present

## 2019-11-11 DIAGNOSIS — Z5181 Encounter for therapeutic drug level monitoring: Secondary | ICD-10-CM

## 2019-11-11 DIAGNOSIS — Z66 Do not resuscitate: Secondary | ICD-10-CM | POA: Diagnosis not present

## 2019-11-11 DIAGNOSIS — A4181 Sepsis due to Enterococcus: Secondary | ICD-10-CM | POA: Diagnosis present

## 2019-11-11 DIAGNOSIS — E876 Hypokalemia: Secondary | ICD-10-CM | POA: Diagnosis present

## 2019-11-11 DIAGNOSIS — Z8614 Personal history of Methicillin resistant Staphylococcus aureus infection: Secondary | ICD-10-CM

## 2019-11-11 DIAGNOSIS — Z885 Allergy status to narcotic agent status: Secondary | ICD-10-CM

## 2019-11-11 DIAGNOSIS — G4733 Obstructive sleep apnea (adult) (pediatric): Secondary | ICD-10-CM | POA: Diagnosis present

## 2019-11-11 DIAGNOSIS — E785 Hyperlipidemia, unspecified: Secondary | ICD-10-CM | POA: Diagnosis present

## 2019-11-11 DIAGNOSIS — F0390 Unspecified dementia without behavioral disturbance: Secondary | ICD-10-CM | POA: Diagnosis present

## 2019-11-11 DIAGNOSIS — I639 Cerebral infarction, unspecified: Secondary | ICD-10-CM | POA: Diagnosis present

## 2019-11-11 DIAGNOSIS — Z789 Other specified health status: Secondary | ICD-10-CM

## 2019-11-11 DIAGNOSIS — K589 Irritable bowel syndrome without diarrhea: Secondary | ICD-10-CM | POA: Diagnosis present

## 2019-11-11 DIAGNOSIS — E039 Hypothyroidism, unspecified: Secondary | ICD-10-CM | POA: Diagnosis present

## 2019-11-11 DIAGNOSIS — Z923 Personal history of irradiation: Secondary | ICD-10-CM

## 2019-11-11 DIAGNOSIS — Z853 Personal history of malignant neoplasm of breast: Secondary | ICD-10-CM

## 2019-11-11 DIAGNOSIS — Z8249 Family history of ischemic heart disease and other diseases of the circulatory system: Secondary | ICD-10-CM

## 2019-11-11 LAB — COMPREHENSIVE METABOLIC PANEL
ALT: 43 U/L (ref 0–44)
AST: 58 U/L — ABNORMAL HIGH (ref 15–41)
Albumin: 1.9 g/dL — ABNORMAL LOW (ref 3.5–5.0)
Alkaline Phosphatase: 179 U/L — ABNORMAL HIGH (ref 38–126)
Anion gap: 14 (ref 5–15)
BUN: 164 mg/dL — ABNORMAL HIGH (ref 8–23)
CO2: 24 mmol/L (ref 22–32)
Calcium: 8.2 mg/dL — ABNORMAL LOW (ref 8.9–10.3)
Chloride: 102 mmol/L (ref 98–111)
Creatinine, Ser: 4.27 mg/dL — ABNORMAL HIGH (ref 0.44–1.00)
GFR calc Af Amer: 12 mL/min — ABNORMAL LOW (ref >60)
GFR calc non Af Amer: 10 mL/min — ABNORMAL LOW (ref >60)
Glucose, Bld: 102 mg/dL — ABNORMAL HIGH (ref 70–99)
Potassium: 7.1 mmol/L (ref 3.5–5.1)
Sodium: 140 mmol/L (ref 135–145)
Total Bilirubin: 0.7 mg/dL (ref 0.3–1.2)
Total Protein: 5.9 g/dL — ABNORMAL LOW (ref 6.5–8.1)

## 2019-11-11 LAB — LACTIC ACID, PLASMA
Lactic Acid, Venous: 2 mmol/L (ref 0.5–1.9)
Lactic Acid, Venous: 2.8 mmol/L (ref 0.5–1.9)
Lactic Acid, Venous: 2.1 mmol/L (ref 0.5–1.9)
Lactic Acid, Venous: 2.8 mmol/L (ref 0.5–1.9)
Lactic Acid, Venous: 3.6 mmol/L (ref 0.5–1.9)

## 2019-11-11 LAB — CBC WITH DIFFERENTIAL/PLATELET
Abs Immature Granulocytes: 0.36 10*3/uL — ABNORMAL HIGH (ref 0.00–0.07)
Basophils Absolute: 0.1 10*3/uL (ref 0.0–0.1)
Basophils Relative: 0 %
Eosinophils Absolute: 0 10*3/uL (ref 0.0–0.5)
Eosinophils Relative: 0 %
HCT: 27.4 % — ABNORMAL LOW (ref 36.0–46.0)
Hemoglobin: 8.3 g/dL — ABNORMAL LOW (ref 12.0–15.0)
Immature Granulocytes: 1 %
Lymphocytes Relative: 3 %
Lymphs Abs: 1.1 10*3/uL (ref 0.7–4.0)
MCH: 28.2 pg (ref 26.0–34.0)
MCHC: 30.3 g/dL (ref 30.0–36.0)
MCV: 93.2 fL (ref 80.0–100.0)
Monocytes Absolute: 1.4 10*3/uL — ABNORMAL HIGH (ref 0.1–1.0)
Monocytes Relative: 4 %
Neutro Abs: 33.1 10*3/uL — ABNORMAL HIGH (ref 1.7–7.7)
Neutrophils Relative %: 92 %
Platelets: 239 10*3/uL (ref 150–400)
RBC: 2.94 MIL/uL — ABNORMAL LOW (ref 3.87–5.11)
RDW: 16.3 % — ABNORMAL HIGH (ref 11.5–15.5)
WBC: 36.1 10*3/uL — ABNORMAL HIGH (ref 4.0–10.5)
nRBC: 0 % (ref 0.0–0.2)

## 2019-11-11 LAB — CBC
HCT: 26.7 % — ABNORMAL LOW (ref 36.0–46.0)
Hemoglobin: 7.9 g/dL — ABNORMAL LOW (ref 12.0–15.0)
MCH: 27.8 pg (ref 26.0–34.0)
MCHC: 29.6 g/dL — ABNORMAL LOW (ref 30.0–36.0)
MCV: 94 fL (ref 80.0–100.0)
Platelets: 187 10*3/uL (ref 150–400)
RBC: 2.84 MIL/uL — ABNORMAL LOW (ref 3.87–5.11)
RDW: 16.4 % — ABNORMAL HIGH (ref 11.5–15.5)
WBC: 28 10*3/uL — ABNORMAL HIGH (ref 4.0–10.5)
nRBC: 0 % (ref 0.0–0.2)

## 2019-11-11 LAB — URINALYSIS, MICROSCOPIC (REFLEX)
RBC / HPF: >50 RBC/hpf (ref 0–5)
Squamous Epithelial / HPF: NONE SEEN (ref 0–5)
WBC, UA: >50 WBC/hpf (ref 0–5)

## 2019-11-11 LAB — URINALYSIS, ROUTINE W REFLEX MICROSCOPIC
Bilirubin Urine: NEGATIVE
Glucose, UA: NEGATIVE mg/dL
Glucose, UA: NEGATIVE mg/dL
Hgb urine dipstick: NEGATIVE
Ketones, ur: NEGATIVE mg/dL
Ketones, ur: NEGATIVE mg/dL
Nitrite: POSITIVE — AB
Nitrite: NEGATIVE
Protein, ur: 100 mg/dL — AB
Protein, ur: 100 mg/dL — AB
Specific Gravity, Urine: <1.005 — ABNORMAL LOW (ref 1.005–1.030)
Specific Gravity, Urine: 1.015 (ref 1.005–1.030)
pH: >9 — ABNORMAL HIGH (ref 5.0–8.0)
pH: 8.5 — ABNORMAL HIGH (ref 5.0–8.0)

## 2019-11-11 LAB — BASIC METABOLIC PANEL
Anion gap: 13 (ref 5–15)
Anion gap: 13 (ref 5–15)
BUN: 155 mg/dL — ABNORMAL HIGH (ref 8–23)
BUN: 142 mg/dL — ABNORMAL HIGH (ref 8–23)
CO2: 23 mmol/L (ref 22–32)
CO2: 25 mmol/L (ref 22–32)
Calcium: 7.9 mg/dL — ABNORMAL LOW (ref 8.9–10.3)
Calcium: 8.7 mg/dL — ABNORMAL LOW (ref 8.9–10.3)
Chloride: 104 mmol/L (ref 98–111)
Chloride: 103 mmol/L (ref 98–111)
Creatinine, Ser: 3.54 mg/dL — ABNORMAL HIGH (ref 0.44–1.00)
Creatinine, Ser: 3.34 mg/dL — ABNORMAL HIGH (ref 0.44–1.00)
GFR calc Af Amer: 14 mL/min — ABNORMAL LOW (ref >60)
GFR calc Af Amer: 15 mL/min — ABNORMAL LOW (ref >60)
GFR calc non Af Amer: 12 mL/min — ABNORMAL LOW (ref >60)
GFR calc non Af Amer: 13 mL/min — ABNORMAL LOW (ref >60)
Glucose, Bld: 90 mg/dL (ref 70–99)
Glucose, Bld: 129 mg/dL — ABNORMAL HIGH (ref 70–99)
Potassium: 6.1 mmol/L — ABNORMAL HIGH (ref 3.5–5.1)
Potassium: 5.2 mmol/L — ABNORMAL HIGH (ref 3.5–5.1)
Sodium: 140 mmol/L (ref 135–145)
Sodium: 141 mmol/L (ref 135–145)

## 2019-11-11 LAB — MAGNESIUM: Magnesium: 2.2 mg/dL (ref 1.7–2.4)

## 2019-11-11 LAB — PROTIME-INR
INR: 1.3 — ABNORMAL HIGH (ref 0.8–1.2)
Prothrombin Time: 16.1 seconds — ABNORMAL HIGH (ref 11.4–15.2)

## 2019-11-11 LAB — POTASSIUM: Potassium: 6.1 mmol/L — ABNORMAL HIGH (ref 3.5–5.1)

## 2019-11-11 LAB — NA AND K (SODIUM & POTASSIUM), RAND UR
Potassium Urine: 52 mmol/L
Sodium, Ur: 65 mmol/L

## 2019-11-11 LAB — MRSA PCR SCREENING: MRSA by PCR: POSITIVE — AB

## 2019-11-11 LAB — PHOSPHORUS: Phosphorus: 3.2 mg/dL (ref 2.5–4.6)

## 2019-11-11 LAB — PROCALCITONIN: Procalcitonin: 98.98 ng/mL

## 2019-11-11 MED ORDER — DEXTROSE 50 % IV SOLN
1.0000 | Freq: Once | INTRAVENOUS | Status: AC
  Administered 2019-11-11: 19:00:00 50 mL via INTRAVENOUS
  Filled 2019-11-11: qty 50

## 2019-11-11 MED ORDER — CHLORHEXIDINE GLUCONATE CLOTH 2 % EX PADS
6.0000 | MEDICATED_PAD | Freq: Every day | CUTANEOUS | Status: DC
  Administered 2019-11-11 – 2019-11-26 (×16): 6 via TOPICAL

## 2019-11-11 MED ORDER — PANTOPRAZOLE SODIUM 40 MG PO TBEC: 40.0000 mg | DELAYED_RELEASE_TABLET | Freq: Every day | ORAL | Status: DC

## 2019-11-11 MED ORDER — SODIUM CHLORIDE 0.9 % IV SOLN
1.0000 g | Freq: Once | INTRAVENOUS | Status: AC
  Administered 2019-11-11: 18:00:00 1 g via INTRAVENOUS
  Filled 2019-11-11: qty 10

## 2019-11-11 MED ORDER — LACTATED RINGERS IV BOLUS (SEPSIS)
250.0000 mL | Freq: Once | INTRAVENOUS | Status: AC
  Administered 2019-11-11: 250 mL via INTRAVENOUS

## 2019-11-11 MED ORDER — ORAL CARE MOUTH RINSE
15.0000 mL | Freq: Two times a day (BID) | OROMUCOSAL | Status: DC
  Administered 2019-11-11 – 2019-11-14 (×6): 15 mL via OROMUCOSAL

## 2019-11-11 MED ORDER — MIDODRINE HCL 5 MG PO TABS
10.0000 mg | ORAL_TABLET | Freq: Three times a day (TID) | ORAL | Status: DC
  Administered 2019-11-12 – 2019-11-19 (×17): 10 mg
  Filled 2019-11-11 (×19): qty 2

## 2019-11-11 MED ORDER — VANCOMYCIN HCL 1250 MG/250ML IV SOLN
1250.0000 mg | INTRAVENOUS | Status: AC
  Administered 2019-11-11: 1250 mg via INTRAVENOUS
  Filled 2019-11-11: qty 250

## 2019-11-11 MED ORDER — NOREPINEPHRINE 4 MG/250ML-% IV SOLN
2.0000 ug/min | INTRAVENOUS | Status: DC
  Administered 2019-11-11: 7 ug/min via INTRAVENOUS
  Administered 2019-11-13: 06:00:00 3 ug/min via INTRAVENOUS
  Filled 2019-11-11 (×3): qty 250

## 2019-11-11 MED ORDER — INSULIN ASPART 100 UNIT/ML IV SOLN
5.0000 [IU] | Freq: Once | INTRAVENOUS | Status: AC
  Administered 2019-11-11: 5 [IU] via INTRAVENOUS
  Filled 2019-11-11: qty 0.05

## 2019-11-11 MED ORDER — NOREPINEPHRINE 4 MG/250ML-% IV SOLN
0.0000 ug/min | INTRAVENOUS | Status: DC
  Administered 2019-11-11: 4 ug/min via INTRAVENOUS
  Filled 2019-11-11: qty 250

## 2019-11-11 MED ORDER — HEPARIN SODIUM (PORCINE) 5000 UNIT/ML IJ SOLN
5000.0000 [IU] | Freq: Three times a day (TID) | INTRAMUSCULAR | Status: DC
  Administered 2019-11-11 – 2019-11-25 (×40): 5000 [IU] via SUBCUTANEOUS
  Filled 2019-11-11 (×37): qty 1

## 2019-11-11 MED ORDER — LACTATED RINGERS IV SOLN: INTRAVENOUS | Status: DC

## 2019-11-11 MED ORDER — METRONIDAZOLE IN NACL 5-0.79 MG/ML-% IV SOLN
500.0000 mg | Freq: Three times a day (TID) | INTRAVENOUS | Status: DC
  Administered 2019-11-11 – 2019-11-19 (×23): 500 mg via INTRAVENOUS
  Filled 2019-11-11 (×24): qty 100

## 2019-11-11 MED ORDER — MIDODRINE HCL 5 MG PO TABS
10.0000 mg | ORAL_TABLET | Freq: Three times a day (TID) | ORAL | Status: DC
  Filled 2019-11-11 (×2): qty 2

## 2019-11-11 MED ORDER — SODIUM CHLORIDE 0.9 % IV SOLN
250.0000 mL | INTRAVENOUS | Status: DC
  Administered 2019-11-12 – 2019-11-13 (×2): 250 mL via INTRAVENOUS
  Administered 2019-11-19: 500 mL via INTRAVENOUS

## 2019-11-11 MED ORDER — SODIUM ZIRCONIUM CYCLOSILICATE 10 G PO PACK
10.0000 g | PACK | Freq: Once | ORAL | Status: AC
  Administered 2019-11-11: 15:00:00 10 g via ORAL
  Filled 2019-11-11: qty 1

## 2019-11-11 MED ORDER — SODIUM CHLORIDE 0.9 % IV SOLN
2.0000 g | INTRAVENOUS | Status: DC
  Administered 2019-11-12: 11:00:00 2 g via INTRAVENOUS
  Filled 2019-11-11: qty 2

## 2019-11-11 MED ORDER — SODIUM BICARBONATE 8.4 % IV SOLN
50.0000 meq | Freq: Once | INTRAVENOUS | Status: AC
  Administered 2019-11-11: 19:00:00 50 meq via INTRAVENOUS
  Filled 2019-11-11: qty 50

## 2019-11-11 MED ORDER — LACTATED RINGERS IV BOLUS
1000.0000 mL | Freq: Once | INTRAVENOUS | Status: AC
  Administered 2019-11-11: 1000 mL via INTRAVENOUS

## 2019-11-11 MED ORDER — PANTOPRAZOLE SODIUM 40 MG PO PACK
40.0000 mg | PACK | Freq: Every day | ORAL | Status: DC
  Administered 2019-11-11 – 2019-12-02 (×21): 40 mg
  Filled 2019-11-11 (×22): qty 20

## 2019-11-11 MED ORDER — METRONIDAZOLE IN NACL 5-0.79 MG/ML-% IV SOLN
500.0000 mg | Freq: Once | INTRAVENOUS | Status: AC
  Administered 2019-11-11: 13:00:00 500 mg via INTRAVENOUS
  Filled 2019-11-11: qty 100

## 2019-11-11 MED ORDER — VANCOMYCIN VARIABLE DOSE PER UNSTABLE RENAL FUNCTION (PHARMACIST DOSING): Status: DC

## 2019-11-11 MED ORDER — LACTATED RINGERS IV BOLUS (SEPSIS)
1000.0000 mL | Freq: Once | INTRAVENOUS | Status: AC
  Administered 2019-11-11: 1000 mL via INTRAVENOUS

## 2019-11-11 MED ORDER — LACTATED RINGERS IV BOLUS (SEPSIS)
500.0000 mL | Freq: Once | INTRAVENOUS | Status: AC
  Administered 2019-11-11: 500 mL via INTRAVENOUS

## 2019-11-11 MED ORDER — SODIUM CHLORIDE 0.9 % IV SOLN
2.0000 g | INTRAVENOUS | Status: AC
  Administered 2019-11-11: 2 g via INTRAVENOUS
  Filled 2019-11-11: qty 2

## 2019-11-11 NOTE — ED Notes (Signed)
Pt passed liquid stool, pt cleaned linens changed, new sacral dressing placed

## 2019-11-11 NOTE — Progress Notes (Signed)
[facility] Progress Note Patient Name: Barbara Flowers DOB: [birth year] MRN: [id number]   Date of Service  11/02/2019  HPI/Events of Note  Asked to review case due to HR 140s-160s. A prior EKG showed atrial flutter. Current EKG shows atrial flutter at 140 bpm.  However, at present time the patient is back in NSR with HR 80s.  She is being treated for infection and is on levophed at 25mcg/min.   eICU Interventions  Continue to monitor for now. If the patient has recurrent, persistent/sustainedepisodes of atrial fib/flutter we may wish to consider loading with IV amiodarone.      Intervention Category Intermediate Interventions: Arrhythmia - evaluation and management  [name] Aleria Maheu 10/31/2019, 11:52 PM

## 2019-11-11 NOTE — Progress Notes (Signed)
[facility] Progress Note Patient Name: Nayellie Moleski DOB: 10-22-1950 MRN: [id number]   Date of Service  11/07/2019  HPI/Events of Note  RN requests change of levophed order to reflect hospital protocol for peripheral infusion.  Also, low BP at 83/46 but receiving 1L IVF and has improved already to 102/61 on last recheck.   eICU Interventions  - Receiving IVF bolus as above. - Levophed order changed using the Peripheral Pressor Order Set.     Intervention Category Intermediate Interventions: Hypotension - evaluation and management Minor Interventions: Other:  Charlott Rakes 11/19/2019, 10:08 PM

## 2019-11-11 NOTE — ED Notes (Signed)
Date and time results received: 10/31/2019 1553 (use smartphrase ".now" to insert current time)  Test: Lactic Acid Critical Value: 2.8  Name of Provider Notified: Eulis Foster, MD  Orders Received? Or Actions Taken?: Orders Received - See Orders for details

## 2019-11-11 NOTE — ED Notes (Signed)
Date and time results received: 11/10/2110:51 PM  Test: lactic acid Critical Value: 2.1

## 2019-11-11 NOTE — Consult Note (Signed)
Reason for Consult: AKI Referring Physician: Lonny Prude, MD  Barbara Flowers is an 69 y.o. female.  HPI: Barbara Flowers has an extensive PMH significant for Shy-Drager syndrome, dementia, breast cancer s/p mastectomy, CVA with left-sided hemiplegia, lower extremity contractures (wheelchair bound), severe protein malnutrition, chronic CHF (EF 45%), and a stage IV sacral decubitus ulcer (following a prolonged hospitalization frm 09/16/19-10/21/19 due to acute hypoxic respiratory failure due to aspiration pneumonia with sepsis), who was brought to [facility] ED with a 2 day history of worsening mental status.  Her husband stated that she is normally conversant but was more somnolent over the past 2 days.  He also noted decreased UOP from her chronic indwelling foley cathter.  She was started on levaquin yesterday for treatment of a presumed UTI.    In the ED she was found to be febrile at 99.2, and hypotensive with BP of 73/53.  Labs were notable for a K of 7.1, BUN/Cr of 164/4.27, lactic acid of 2.8, and WBC of 36.  CXR was negative for infiltration.  Bladder scan showed >377 cc of urine and her foley catheter was exchanged by Dr. Eulis Flowers in the ED and noted frank purulent material and spontaneous urine production.  We were consulted to further evaluate and manage her AKI and hyperkalemia.  The trend in Scr is seen below.    Barbara Flowers states that he only uses 4 syringes of sterile water a day to flush her PEG at home and was not instructed on giving a specific amount of free water.  Pt is currently nonverbal so HPI was obtained by interviewing her husband who was at the bedside.  Trend in Creatinine: Creatinine, Ser  Date/Time Value Ref Range Status  11/17/2019 11:51 AM 4.27 (H) 0.44 - 1.00 mg/dL Final  10/21/2019 01:46 AM 0.38 (L) 0.44 - 1.00 mg/dL Final  10/20/2019 02:31 AM 0.57 0.44 - 1.00 mg/dL Final  10/19/2019 05:07 AM 0.80 0.44 - 1.00 mg/dL Final  10/17/2019 02:48 AM 0.59 0.44 - 1.00 mg/dL Final  10/16/2019  02:28 AM 0.62 0.44 - 1.00 mg/dL Final  10/15/2019 02:20 AM 0.54 0.44 - 1.00 mg/dL Final  10/14/2019 07:06 AM 0.50 0.44 - 1.00 mg/dL Final  10/13/2019 01:49 AM 0.49 0.44 - 1.00 mg/dL Final  10/12/2019 04:30 AM 0.51 0.44 - 1.00 mg/dL Final  10/11/2019 05:00 AM 0.60 0.44 - 1.00 mg/dL Final  10/10/2019 05:00 AM 0.65 0.44 - 1.00 mg/dL Final  10/09/2019 01:37 AM 0.59 0.44 - 1.00 mg/dL Final  10/08/2019 04:16 AM 0.53 0.44 - 1.00 mg/dL Final  10/07/2019 04:17 AM 0.57 0.44 - 1.00 mg/dL Final  10/06/2019 06:09 AM 0.46 0.44 - 1.00 mg/dL Final  10/05/2019 09:05 AM 0.57 0.44 - 1.00 mg/dL Final  10/05/2019 02:05 AM 0.56 0.44 - 1.00 mg/dL Final  10/03/2019 03:12 AM 0.58 0.44 - 1.00 mg/dL Final  10/02/2019 01:52 AM 0.54 0.44 - 1.00 mg/dL Final  10/01/2019 09:06 AM 0.45 0.44 - 1.00 mg/dL Final  09/30/2019 09:25 AM 0.54 0.44 - 1.00 mg/dL Final  09/29/2019 02:27 AM 0.52 0.44 - 1.00 mg/dL Final  09/28/2019 01:41 PM 0.45 0.44 - 1.00 mg/dL Final  09/27/2019 02:12 AM 0.47 0.44 - 1.00 mg/dL Final  09/26/2019 02:16 AM 0.40 (L) 0.44 - 1.00 mg/dL Final  09/25/2019 09:02 AM 0.44 0.44 - 1.00 mg/dL Final  09/24/2019 03:05 AM 0.42 (L) 0.44 - 1.00 mg/dL Final  09/23/2019 01:44 AM 0.49 0.44 - 1.00 mg/dL Final  09/22/2019 02:20 AM 0.47 0.44 - 1.00 mg/dL Final  09/21/2019  02:34 AM 0.50 0.44 - 1.00 mg/dL Final  09/19/2019 02:35 AM 0.57 0.44 - 1.00 mg/dL Final  09/17/2019 06:42 AM 0.71 0.44 - 1.00 mg/dL Final  09/16/2019 04:00 PM 1.14 (H) 0.44 - 1.00 mg/dL Final  05/27/2017 04:10 AM 0.50 0.44 - 1.00 mg/dL Final  05/26/2017 04:07 AM 0.51 0.44 - 1.00 mg/dL Final  05/25/2017 03:57 AM 0.69 0.44 - 1.00 mg/dL Final  05/24/2017 03:40 PM 0.93 0.44 - 1.00 mg/dL Final  08/31/2015 11:42 AM 1.00 0.44 - 1.00 mg/dL Final  08/31/2015 11:35 AM 1.06 (H) 0.44 - 1.00 mg/dL Final    PMH:   Past Medical History:  Diagnosis Date  . Anemia   . Anxiety disorder   . Blood transfusion without reported diagnosis   . Brain cancer [facility])     s/p sterotactic radio surgery  . Brain tumor ([place])   . Breast cancer ([place]) 1991   history of breast cancer status post left lumpectomy, chemotherapy, and radiation in the 1990s  . Cerebrovascular disease   . Dementia ([place])   . Gastric bypass status for obesity   . H/O bone marrow transplant ([place])   . Hypothyroidism   . IBS (irritable bowel syndrome)   . MDD (major depressive disorder)   . Memory loss   . Obesity   . Orthostatic hypotension   . Seizures ([place])   . 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[place])   . Stroke [facility]) 2005   hemorrhagic  . Subdural hematoma ([place])   . Sundowning   . TIA (transient ischemic attack) 2016  . Upper brachial plexus paralysis syndrome   . Urinary incontinence     PSH:   Past Surgical History:  Procedure Laterality Date  . BONE MARROW TRANSPLANT  1991  . BREAST LUMPECTOMY Left 1991   post chemotherapy and radiation  . CHOLECYSTECTOMY    . CRANIOTOMY FOR HEMISPHERECTOMY TOTAL / PARTIAL Right 2010   Brain Metastasis - right temporal lobe - excised  . GASTRIC BYPASS  1985  . HAND SURGERY Left 2015  . HAND TENDON SURGERY Left    for brachial plexus injury  . INCISIONAL HERNIA REPAIR N/A 10/18/2019   Procedure: LAPAROSCOPIC INCISIONAL HERNIA REPAIR;  Surgeon: Michael Boston, MD;  Location: [facility] ORS;  Service: General;  Laterality: N/A;  . LAPAROSCOPIC GASTROSTOMY N/A 10/18/2019   Procedure: LAPAROSCOPIC GASTROSTOMY;  Surgeon: Michael Boston, MD;  Location: [facility] ORS;  Service: General;  Laterality: N/A;  . LAPAROSCOPIC LYSIS OF ADHESIONS N/A 10/18/2019   Procedure: LAPAROSCOPIC LYSIS OF ADHESIONS;  Surgeon: Michael Boston, MD;  Location: [facility]S;  Service: General;  Laterality: N/A;  . LYMPH NODE BIOPSY      Allergies:  Allergies  Allergen Reactions  . Gabapentin Other (See Comments)    seizure  . Tramadol Other (See Comments)    Brings on Seizures     Medications:   Prior to Admission medications   Medication Sig Start Date End Date Taking? Authorizing  Provider  acetaminophen (TYLENOL) 160 MG/5ML solution Place 20.3 mLs (650 mg total) into feeding tube every 4 (four) hours as needed for fever. 10/21/19   [name], MD  ALPRAZolam Duanne Moron) 1 MG tablet Take 1 mg by mouth at bedtime as needed for anxiety.    [provider]  Amino Acids-Protein Hydrolys (FEEDING SUPPLEMENT, PRO-STAT SUGAR FREE 64,) LIQD Place 30 mLs into feeding tube daily. 10/22/19   [name], MD  Ascorbic Acid (VITAMIN C) 1000 MG tablet Take 1,000 mg by mouth daily.    [provider]  aspirin EC 81 MG tablet Take 81 mg by mouth daily.    [provider]  atorvastatin (LIPITOR) 20 MG tablet Take 20 mg by mouth at bedtime. 09/05/19   [provider]  Cholecalciferol (VITAMIN D3) 50 MCG (2000 UT) TABS Take 2,000 Units by mouth daily.    [provider]  collagenase (SANTYL) ointment Apply topically daily. 10/22/19   [name], MD  ferrous sulfate 325 (65 FE) MG EC tablet Take 325 mg by mouth daily with breakfast.    [provider]  levofloxacin (LEVAQUIN) 750 MG tablet Take 750 mg by mouth daily. 11/10/19   [provider]  midodrine (PROAMATINE) 10 MG tablet Take 10 mg by mouth 3 (three) times daily.     [provider]  Multiple Vitamins-Minerals (MULTIVITAMIN WITH MINERALS) tablet Take 1 tablet by mouth daily.    [provider]  Nutritional Supplements (FEEDING SUPPLEMENT, OSMOLITE 1.5 CAL,) LIQD Place 1,000 mLs into feeding tube continuous. 10/21/19   [name], MD  pantoprazole (PROTONIX) 40 MG tablet Take 40 mg by mouth daily. 08/29/19   [provider]  Potassium 99 MG TABS Take 99 mg by mouth daily.    [provider]  Water For Irrigation, Sterile (FREE WATER) SOLN Place 100 mLs into feeding tube every 4 (four) hours. 10/21/19   [name], MD    Inpatient medications: . heparin  5,000 Units Subcutaneous Q8H  . insulin aspart  5 Units Intravenous Once  .  midodrine  10 mg Oral TID WC  . [START ON 11/12/2019] pantoprazole  40 mg Oral Q1200  . vancomycin variable dose per unstable renal function (pharmacist dosing)   Does not apply See admin instructions    Discontinued Meds:  There are no discontinued medications.  Social History:  reports that she has never smoked. She has never used smokeless tobacco. She reports current alcohol use. She reports that she does not use drugs.  Family History:   Family History  Problem Relation Age of Onset  . Hypertension Mother   . Stroke Mother   . Heart disease Father   . Hypertension Father     Review of systems not obtained due to patient factors. Weight change:   Intake/Output Summary (Last 24 hours) at 11/18/2019 1842 Last data filed at 10/23/2019 1837 Gross per 24 hour  Intake 358.6 ml  Output --  Net 358.6 ml   BP (!) 96/56   Pulse 78   Temp 99.2 F (37.3 C) (Rectal)   Resp (!) 22   Wt 57.5 kg   SpO2 99%   BMI 19.27 kg/m  Vitals:   11/02/2019 1548 11/09/2019 1645 11/19/2019 1700 11/14/2019 1800  BP: (!) 93/55 91/60 (!) 96/56   Pulse: 78 78 78   Resp: 17 16 (!) 22   Temp:      TempSrc:      SpO2: 99% 98% 99%   Weight:    57.5 kg     General appearance: cachectic, mild distress, pale, uncooperative and rolled in fetal position and moaning intermittently, nonverbal Head: atraumatic, bitemporal wasting Eyes: negative findings: lids and lashes normal and conjunctivae and sclerae normal Resp: clear to auscultation bilaterally Cardio: regular rate and rhythm, S1, S2 normal, no murmur, click, rub or gallop GI: +BS, soft, diffusely tender (moaning during exam), and PEG in place Extremities: no edema, lower extremity contractures, large sacral wound with fresh dressing in place  Labs: Basic Metabolic Panel: Recent Labs  Lab 11/10/2019  1151 11/15/2019 1740  NA 140 140  K 7.1* 6.1*  CL 102 104  CO2 24 23  GLUCOSE 102* 90  BUN 164* PENDING  CREATININE 4.27* 3.54*  ALBUMIN 1.9*  --    CALCIUM 8.2* 7.9*   Liver Function Tests: Recent Labs  Lab 10/30/2019 1151  AST 58*  ALT 43  ALKPHOS 179*  BILITOT 0.7  PROT 5.9*  ALBUMIN 1.9*   No results for input(s): LIPASE, AMYLASE in the last 168 hours. No results for input(s): AMMONIA in the last 168 hours. CBC: Recent Labs  Lab 10/24/2019 1151  WBC 36.1*  NEUTROABS 33.1*  HGB 8.3*  HCT 27.4*  MCV 93.2  PLT 239   PT/INR: @LABRCNTIP (inr:5) Cardiac Enzymes: )No results for input(s): CKTOTAL, CKMB, CKMBINDEX, TROPONINI in the last 168 hours. CBG: No results for input(s): GLUCAP in the last 168 hours.  Iron Studies: No results for input(s): IRON, TIBC, TRANSFERRIN, FERRITIN in the last 168 hours.  Xrays/Other Studies: DG Chest 2 View  Result Date: 10/30/2019 CLINICAL DATA:  Altered mental status EXAM: CHEST - 2 VIEW COMPARISON:  Chest radiograph dated 10/12/2019 FINDINGS: The patient is rotated to the left. The heart size is within normal limits. Vascular calcifications are seen in the aortic arch. Lucency over the left lung base may reflect a small pneumothorax on this supine exam or be secondary to patient positioning. There is no right pneumothorax. There is no pleural effusion or focal consolidation. Degenerative changes are seen in both shoulders. IMPRESSION: Lucency over the left lung base may reflect a small pneumothorax on this supine exam or be secondary to patient positioning. This could be confirmed with an upright chest radiograph. These results will be called to the ordering clinician or representative by the Radiologist Assistant, and communication documented in the PACS or zVision Dashboard. Electronically Signed   By: Zerita Boers M.D.   On: 11/09/2019 12:41   US RENAL  Result Date: 11/17/2019 CLINICAL DATA:  69 year old female with altered mental status. Acute renal insufficiency. EXAM: RENAL / URINARY TRACT ULTRASOUND COMPLETE COMPARISON:  None. FINDINGS: Evaluation is limited due to body habitus and  overlying bowel gas. Right Kidney: Renal measurements: 10.2 x 4.5 x 5.2 cm = volume: 125 mL. Normal echogenicity. No hydronephrosis or shadowing stone. Left Kidney: Renal measurements: 9.6 x 4.9 x 4.5 cm = volume: 111 mL. Normal echogenicity. No hydronephrosis or shadowing stone. Bladder: The urinary bladder is partially distended despite presence of a Foley catheter. Other: None. IMPRESSION: 1. No hydronephrosis or shadowing stone. 2. Partially distended urinary bladder despite presence of a Foley catheter. Electronically Signed   By: Anner Crete M.D.   On: 11/01/2019 17:11   DG Chest Port 1 View  Result Date: 10/29/2019 CLINICAL DATA:  Abnormal x-ray with concern for pneumothorax EXAM: PORTABLE CHEST 1 VIEW COMPARISON:  Yesterday FINDINGS: Kyphotic positioning with chin over the right more than left apex. No visible pneumothorax. When allowing for interstitial crowding there is no convincing airspace disease. Postoperative left axilla. Normal heart size and mediastinal contours. 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 There is asymmetric left-sided shoulder girdle osteopenia. Suspect findings are sequela of remote radiotherapy. IMPRESSION: No acute finding including evidence of pneumothorax. Electronically Signed   By: Monte Fantasia M.D.   On: 10/27/2019 13:22     Assessment/Plan: 1.  AKI- presumably due to combination of ischemic ATN due to sepsis/volume depletion and bladder outlet obstruction.  Her foley catheter had sludge but able to loosen it and clear urine  now flowing easily. 1. Increased UOP post foley exchange 2. Continue with IVF's and blood pressure maintenance 3. She is not a candidate for dialysis given her poor functional and nutritional status and hopefully this can be managed medically as her renal function was excellent at time of discharge on 10/21/19. 4. Continue with supportive care for now.   2. Hyperkalemia- due to AKI and bladder outlet obstruction.  No ECG  changes. 1. Obstruction has been corrected 2. Pt has received insulin/D50, bicarb, and lokelma 3. Recheck labs show K improving to 6.1. 4. Continue with IVF's and monitoring labs. 3. Septic shock from UTI from chronic indwelling foley catheter.  For ICU admission and peripheral pressors per PCCM. 4. Anemia- presumably due to chronic illness 5. Recurrent aspiration and chronic malnutrition- feedings via PEG tube 6. Chronic CHF- volume depleted at present 7. Bladder outlet obstruction- with chronic indwelling foley catheter.  Still with some debris in new foley and may benefit from a 3 way foley with bladder irrigation and Urology evaluation. 8. Stage IV sacral decubitus ulcer- had bowel incontinence and pt was cleaned and new dressing applied.  Will need ongoing wound care. 9. Disposition- poor overall prognosis and quality of life.  Agree with palliative care consult to help set goals/limits of care.   Governor Rooks Oluwaseun Bruyere 11/10/2019, 6:42 PM

## 2019-11-11 NOTE — ED Notes (Signed)
Received call back from nurse angelica for pt transfer to the floor

## 2019-11-11 NOTE — ED Notes (Signed)
Husband at bedside. Updated to this point by RN. Dr. Eulis Foster made aware that he would like to speak with him.

## 2019-11-11 NOTE — ED Notes (Addendum)
Pt transported to Xray. Physician updated about pt status.

## 2019-11-11 NOTE — ED Notes (Signed)
ED Provider at bedside. 

## 2019-11-11 NOTE — Progress Notes (Signed)
Notified bedside nurse of need to order and draw repeat lactic acid. 

## 2019-11-11 NOTE — H&P (Addendum)
NAME:  Barbara Flowers, MRN:  [id number], DOB:  [birth year], LOS: 0 ADMISSION DATE:  10/28/2019, CONSULTATION DATE: 11/06/2019 REFERRING MD: Gardenia Phlegm MD, CHIEF COMPLAINT: Septic shock  Brief History   69 year old with complicated history as noted below with recent admission from 12/26 to 10/21/2019 for respiratory failure, MRSA pneumonia, CHF.  Returns to hospital with low urine output, altered mental status. She was started on levaquin as an outpatient for UTI but continued to worsen. In ED she has septic shock from indwelling Foley (which was replaced), urosepsis with AKI, hyperkalemia.  She also received Lokelma, 5 units insulin, dextrose, 1 amp bicarb.  Past Medical History  Breast cancer status postmastectomy, brain aneurysm, dementia, CVA with left hemiplegia, cardiac contractures, stage IV sacral decub, Shy-Drager syndrome, LV thrombus, atrial fibrillation/flutter  Significant Hospital Events   2/20- Admit  Consults:  PCCM  Procedures:    Significant Diagnostic Tests:  Renal ultrasound 2/20-no hydronephrosis  Chest x-ray 2/20-no lung infiltrate or acute findings   Micro Data:  Blood cultures 2/20-  Urine Cultures 2/20-  Antimicrobials:  Vanco Flagyl Cefepime  Interim history/subjective:    Objective   Blood pressure (!) 93/55, pulse 78, temperature 99.2 F (37.3 C), temperature source Rectal, resp. rate 17, SpO2 99 %.       No intake or output data in the 24 hours ending 11/01/2019 1701 There were no vitals filed for this visit.  Examination: Gen:      Chronically ill-appearing, frail HEENT:  EOMI, sclera anicteric Neck:     No masses; no thyromegaly Lungs:    Scattered rhonchi CV:         Regular rate and rhythm; no murmurs Abd:      + bowel sounds; soft, non-tender; no palpable masses, no distension Ext:    Contracted extremities Skin:      Warm and dry; no rash Neuro: Awake, responds to stimulus  Resolved Hospital Problem list     Assessment &  Plan:  Septic shock secondary to UTI from indwelling Foley's So far she has received about 1 Lt LR We will give additional 1 L bolus and drip at 100 cc an hour Peripheral pressors.  Hope to avoid central line if we can Follow lactic acid Broad antibiotic with vancomycin, cefepime  AKI, hypokalemia Secondary to sepsis, urinary retention Insulin, dextrose, bicarb [facility] consulted  Chronic hypertension Midodrine per tube  Chronic CHF EF was previously 20 to 35%.  Improved to 45% on discharge at last admission Telemetry monitoring  Recurrent aspiration, chronic protein energy malnutrition Feeding tubes via PEG tube Nutrition consult  Sacral decub stage IV Wound care consult  Goals of care Discussed with husband at bedside and son who is a neurosurgery resident over telephone. Patient previously had palliative involved with goals of care discussion At present they are requesting ICU admission, pressors, central line if needed. They are not sure about intubation or CPR and want to discuss further about it We will get palliative care involved again.  Best practice:  Diet: Tube feed Pain/Anxiety/Delirium protocol (if indicated): NA VAP protocol (if indicated): NA DVT prophylaxis: Heparin SQ GI prophylaxis: PPI Glucose control: Montior Mobility: Bed Code Status: Full Family Communication: As above Disposition: ICU  Labs   CBC: Recent Labs  Lab 11/01/2019 1151  WBC 36.1*  NEUTROABS 33.1*  HGB 8.3*  HCT 27.4*  MCV 93.2  PLT [id number]    Basic Metabolic Panel: Recent Labs  Lab 10/28/2019 1151  NA 140  K 7.1*  CL 102  CO2 24  GLUCOSE 102*  BUN 164*  CREATININE 4.27*  CALCIUM 8.2*   GFR: CrCl cannot be calculated (Unknown ideal weight.). Recent Labs  Lab 11/05/2019 1151 11/09/2019 1153 11/12/2019 1430  WBC 36.1*  --   --   LATICACIDVEN  --  2.0* 2.8*    Liver Function Tests: Recent Labs  Lab 11/10/2019 1151  AST 58*  ALT 43  ALKPHOS 179*  BILITOT  0.7  PROT 5.9*  ALBUMIN 1.9*   No results for input(s): LIPASE, AMYLASE in the last 168 hours. No results for input(s): AMMONIA in the last 168 hours.  ABG    Component Value Date/Time   PHART 7.463 (H) 10/09/2019 1125   PCO2ART 40.7 10/09/2019 1125   PO2ART 119 (H) 10/09/2019 1125   HCO3 28.7 (H) 10/09/2019 1125   TCO2 24 08/31/2015 1142   ACIDBASEDEF 0.8 09/16/2019 1622   O2SAT 97.8 10/09/2019 1125     Coagulation Profile: Recent Labs  Lab 10/29/2019 1151  INR 1.3*    Cardiac Enzymes: No results for input(s): CKTOTAL, CKMB, CKMBINDEX, TROPONINI in the last 168 hours.  HbA1C: Hgb A1c MFr Bld  Date/Time Value Ref Range Status  09/18/2019 01:30 PM 5.6 4.8 - 5.6 % Final    Comment:    (NOTE) Pre diabetes:          5.7%-6.4% Diabetes:              >6.4% Glycemic control for   <7.0% adults with diabetes   09/01/2015 04:28 AM 5.7 (H) 4.8 - 5.6 % Final    Comment:    (NOTE)         Pre-diabetes: 5.7 - 6.4         Diabetes: >6.4         Glycemic control for adults with diabetes: <7.0     CBG: No results for input(s): GLUCAP in the last 168 hours.  Review of Systems:   Unable to obtain due to altered mental status  Past Medical History  She,  has a past medical history of Anemia, Anxiety disorder, Blood transfusion without reported diagnosis, Brain cancer ([place]), Brain tumor ([place]), Breast cancer ([place]) (1991), Cerebrovascular disease, Dementia ([place]), Gastric bypass status for obesity, H/O bone marrow transplant ([place]), Hypothyroidism, IBS (irritable bowel syndrome), MDD (major depressive disorder), Memory loss, Obesity, Orthostatic hypotension, Seizures ([place]), 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[place]), Stroke ([place]) (2005), Subdural hematoma ([place]), Sundowning, TIA (transient ischemic attack) (2016), Upper brachial plexus paralysis syndrome, and Urinary incontinence.   Surgical History    Past Surgical History:  Procedure Laterality Date  . BONE MARROW TRANSPLANT  1991  . BREAST  LUMPECTOMY Left 1991   post chemotherapy and radiation  . CHOLECYSTECTOMY    . CRANIOTOMY FOR HEMISPHERECTOMY TOTAL / PARTIAL Right 2010   Brain Metastasis - right temporal lobe - excised  . GASTRIC BYPASS  1985  . HAND SURGERY Left 2015  . HAND TENDON SURGERY Left    for brachial plexus injury  . INCISIONAL HERNIA REPAIR N/A 10/18/2019   Procedure: LAPAROSCOPIC INCISIONAL HERNIA REPAIR;  Surgeon: Michael Boston, MD;  Location: [facility];  Service: General;  Laterality: N/A;  . LAPAROSCOPIC GASTROSTOMY N/A 10/18/2019   Procedure: LAPAROSCOPIC GASTROSTOMY;  Surgeon: Michael Boston, MD;  Location: [facility];  Service: General;  Laterality: N/A;  . LAPAROSCOPIC LYSIS OF ADHESIONS N/A 10/18/2019   Procedure: LAPAROSCOPIC LYSIS OF ADHESIONS;  Surgeon: Michael Boston, MD;  Location: [facility];  Service: General;  Laterality: N/A;  .  LYMPH NODE BIOPSY       Social History   reports that she has never smoked. She has never used smokeless tobacco. She reports current alcohol use. She reports that she does not use drugs.   Family History   Her family history includes Heart disease in her father; Hypertension in her father and mother; Stroke in her mother.   Allergies Allergies  Allergen Reactions  . Gabapentin Other (See Comments)    seizure  . Tramadol Other (See Comments)    Brings on Seizures      Home Medications  Prior to Admission medications   Medication Sig Start Date End Date Taking? Authorizing Provider  acetaminophen (TYLENOL) 160 MG/5ML solution Place 20.3 mLs (650 mg total) into feeding tube every 4 (four) hours as needed for fever. 10/21/19   [name], MD  ALPRAZolam Duanne Moron) 1 MG tablet Take 1 mg by mouth at bedtime as needed for anxiety.    [provider]  Amino Acids-Protein Hydrolys (FEEDING SUPPLEMENT, PRO-STAT SUGAR FREE 64,) LIQD Place 30 mLs into feeding tube daily. 10/22/19   [name], MD  Ascorbic Acid (VITAMIN C) 1000 MG tablet Take 1,000 mg by mouth daily.     [provider]  aspirin EC 81 MG tablet Take 81 mg by mouth daily.    [provider]  atorvastatin (LIPITOR) 20 MG tablet Take 20 mg by mouth at bedtime. 09/05/19   [provider]  Cholecalciferol (VITAMIN D3) 50 MCG (2000 UT) TABS Take 2,000 Units by mouth daily.    [provider]  collagenase (SANTYL) ointment Apply topically daily. 10/22/19   [name], MD  ferrous sulfate 325 (65 FE) MG EC tablet Take 325 mg by mouth daily with breakfast.    [provider]  levofloxacin (LEVAQUIN) 750 MG tablet Take 750 mg by mouth daily. 11/10/19   [provider]  midodrine (PROAMATINE) 10 MG tablet Take 10 mg by mouth 3 (three) times daily.     [provider]  Multiple Vitamins-Minerals (MULTIVITAMIN WITH MINERALS) tablet Take 1 tablet by mouth daily.    [provider]  Nutritional Supplements (FEEDING SUPPLEMENT, OSMOLITE 1.5 CAL,) LIQD Place 1,000 mLs into feeding tube continuous. 10/21/19   [name], MD  pantoprazole (PROTONIX) 40 MG tablet Take 40 mg by mouth daily. 08/29/19   [provider]  Potassium 99 MG TABS Take 99 mg by mouth daily.    [provider]  Water For Irrigation, Sterile (FREE WATER) SOLN Place 100 mLs into feeding tube every 4 (four) hours. 10/21/19   [name], MD     Critical care time:    The patient is critically ill with multiple organ system failure and requires high complexity decision making for assessment and support, frequent evaluation and titration of therapies, advanced monitoring, review of radiographic studies and interpretation of complex data.   Critical Care Time devoted to patient care services, exclusive of separately billable procedures, described in this note is 45 minutes.   Marshell Garfinkel MD [facility] Please see [email or web address] for pager details.  11/02/2019, 6:18 PM

## 2019-11-11 NOTE — ED Provider Notes (Signed)
[facility] DEPT Provider Note   CSN: [id number] Arrival date & time: 11/17/2019  1110     History Chief Complaint  Patient presents with   Altered Mental Status    Barbara Flowers is a 69 y.o. female.  HPI She presents for evaluation of altered mental status.  She has previously been evaluated and treated for same problem.  She has a complex medical history.  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  She was last hospitalized, September 16, 2019, through October 21, 2019.  At that time she was treated for pneumonia with effusion, hypoxia (requiring intubation) and debilitated state.  She had a palliative care consult, October 20, 2019, at that time aggressive measures were continued and she was stated to be a full code.  Level 5 caveat-dementia    Past Medical History:  Diagnosis Date   Anemia    Anxiety disorder    Blood transfusion without reported diagnosis    Brain cancer [facility])    s/p sterotactic radio surgery   Brain tumor ([place])    Breast cancer ([place]) 1991   history of breast cancer status post left lumpectomy, chemotherapy, and radiation in the 1990s   Cerebrovascular disease    Dementia ([place])    Gastric bypass status for obesity    H/O bone marrow transplant ([place])    Hypothyroidism    IBS (irritable bowel syndrome)    MDD (major depressive disorder)    Memory loss    Obesity    Orthostatic hypotension    Seizures ([place])    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[place])    Stroke ([place]) 2005   hemorrhagic   Subdural hematoma ([place])    Sundowning    TIA (transient ischemic attack) 2016   Upper brachial plexus paralysis syndrome    Urinary incontinence     Patient Active Problem List   Diagnosis Date Noted   Left spastic hemiparesis with h/o strokes 10/19/2019   Malnutrition of moderate degree 10/19/2019   Decreased functional mobility 10/17/2019   Sacral decubitus ulcer, stage IV ([place]) 10/17/2019    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/12/2016   Parkinson's disease ([place]) 06/11/2016   GI bleed 06/09/2016   Chronic right hip pain 01/10/2016   Physical debility 01/10/2016   Wheelchair dependent 2017- 10/17/2015   TIA (transient ischemic attack) 08/31/2015   History of CVA with residual deficit left side 08/31/2015   Hypothyroidism 08/31/2015   OSA on CPAP 08/31/2015   Dementia ([place]) 08/31/2015   Chronic orthostatic hypotension 08/31/2015   OAB (overactive bladder) 08/31/2015   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   Peripheral neuropathy  08/31/2015   Avascular necrosis of right femoral head ([place]) 08/31/2015   Left carotid bruit 08/31/2015   Anemia 08/31/2015    Past Surgical History:  Procedure Laterality Date   BONE MARROW TRANSPLANT  1991   BREAST LUMPECTOMY Left 1991   post chemotherapy and radiation   CHOLECYSTECTOMY     CRANIOTOMY FOR HEMISPHERECTOMY TOTAL / PARTIAL Right 2010   Brain Metastasis - right temporal lobe - excised   GASTRIC BYPASS  1985   HAND SURGERY  Left 2015   HAND TENDON SURGERY Left    for brachial plexus injury   INCISIONAL HERNIA REPAIR N/A 10/18/2019   Procedure: LAPAROSCOPIC INCISIONAL HERNIA REPAIR;  Surgeon: Michael Boston, MD;  Location: [facility] ORS;  Service: General;  Laterality: N/A;   LAPAROSCOPIC GASTROSTOMY N/A 10/18/2019   Procedure: LAPAROSCOPIC GASTROSTOMY;  Surgeon: Michael Boston, MD;  Location: [facility] ORS;  Service: General;  Laterality: N/A;   LAPAROSCOPIC LYSIS OF ADHESIONS N/A 10/18/2019   Procedure: LAPAROSCOPIC LYSIS OF ADHESIONS;  Surgeon: Michael Boston, MD;  Location: [facility];  Service: General;  Laterality: N/A;   LYMPH NODE BIOPSY       OB History   No obstetric history on file.     Family History  Problem Relation Age of Onset   Hypertension Mother    Stroke Mother    Heart disease Father    Hypertension Father     Social History   Tobacco Use   Smoking status: Never Smoker   Smokeless tobacco: Never Used  Substance Use Topics   Alcohol use: Yes    Alcohol/week: 0.0 standard drinks    Comment: socially   Drug use: Never    Home Medications Prior to Admission medications   Medication Sig Start Date End Date Taking? Authorizing Provider  acetaminophen (TYLENOL) 160 MG/5ML solution Place 20.3 mLs (650 mg total) into feeding tube every 4 (four) hours as needed for fever. 10/21/19   [name], MD  ALPRAZolam Duanne Moron) 1 MG tablet Take 1 mg by mouth at bedtime as needed for anxiety.    [provider]  Amino Acids-Protein Hydrolys (FEEDING SUPPLEMENT, PRO-STAT SUGAR FREE 64,) LIQD Place 30 mLs into feeding tube daily. 10/22/19   [name], MD  Ascorbic Acid (VITAMIN C) 1000 MG tablet Take 1,000 mg by mouth daily.    [provider]  aspirin EC 81 MG tablet Take 81 mg by mouth daily.    [provider]  atorvastatin (LIPITOR) 20 MG tablet Take 20 mg by mouth at bedtime. 09/05/19   [provider]  Cholecalciferol (VITAMIN D3) 50 MCG (2000 UT) TABS Take  2,000 Units by mouth daily.    [provider]  collagenase (SANTYL) ointment Apply topically daily. 10/22/19   [name], MD  ferrous sulfate 325 (65 FE) MG EC tablet Take 325 mg by mouth daily with breakfast.    [provider]  levofloxacin (LEVAQUIN) 750 MG tablet Take 750 mg by mouth daily. 11/10/19   [provider]  midodrine (PROAMATINE) 10 MG tablet Take 10 mg by mouth 3 (three) times daily.     [provider]  Multiple Vitamins-Minerals (MULTIVITAMIN WITH MINERALS) tablet Take 1 tablet by mouth daily.    [provider]  Nutritional Supplements (FEEDING SUPPLEMENT, OSMOLITE 1.5 CAL,) LIQD Place 1,000 mLs into feeding tube continuous. 10/21/19   [name], MD  pantoprazole (PROTONIX) 40 MG tablet Take 40 mg by mouth daily. 08/29/19   [provider]  Potassium 99 MG  TABS Take 99 mg by mouth daily.    [provider]  Water For Irrigation, Sterile (FREE WATER) SOLN Place 100 mLs into feeding tube every 4 (four) hours. 10/21/19   [name], MD    Allergies    Gabapentin and Tramadol  Review of Systems   Review of Systems  Unable to perform ROS: Dementia    Physical Exam Updated Vital Signs BP (!) 93/55 (BP Location: Right Arm)    Pulse 78    Temp 99.2 F (37.3 C) (Rectal)    Resp 17    SpO2 99%   Physical Exam Vitals and nursing note reviewed.  Constitutional:      General: She is in acute distress.     Appearance: She is well-developed. She is ill-appearing and toxic-appearing.  HENT:     Head: Normocephalic and atraumatic.     Right Ear: External ear normal.     Left Ear: External ear normal.     Mouth/Throat:     Pharynx: No oropharyngeal exudate.     Comments: Dry oral mucous membranes Eyes:     Conjunctiva/sclera: Conjunctivae normal.     Pupils: Pupils are equal, round, and reactive to light.  Neck:     Trachea: Phonation normal.  Cardiovascular:     Rate and Rhythm: Normal rate and  regular rhythm.     Heart sounds: Normal heart sounds.  Pulmonary:     Effort: Pulmonary effort is normal. No respiratory distress.     Breath sounds: Normal breath sounds. No stridor.  Abdominal:     Palpations: Abdomen is soft.     Tenderness: There is abdominal tenderness (Diffuse, exam difficult because she is in the fetal position.).     Comments: PEG tube, left upper quadrant, site and appliance appear normal.  Musculoskeletal:        General: No swelling or tenderness. Normal range of motion.     Cervical back: Normal range of motion and neck supple.  Skin:    General: Skin is warm and dry.     Coloration: Skin is pale. Skin is not jaundiced.  Neurological:     Mental Status: She is alert.     Motor: No abnormal muscle tone.     Comments: Normal tone in arms and legs bilaterally.  Does not follow commands, moaning.  Psychiatric:     Comments: Lethargic     ED Results / Procedures / Treatments   Labs (all labs ordered are listed, but only abnormal results are displayed) Labs Reviewed  COMPREHENSIVE METABOLIC PANEL - Abnormal; Notable for the following components:      Result Value   Potassium 7.1 (*)    Glucose, Bld 102 (*)    BUN 164 (*)    Creatinine, Ser 4.27 (*)    Calcium 8.2 (*)    Total Protein 5.9 (*)    Albumin 1.9 (*)    AST 58 (*)    Alkaline Phosphatase 179 (*)    GFR calc non Af Amer 10 (*)    GFR calc Af Amer 12 (*)    All other components within normal limits  LACTIC ACID, PLASMA - Abnormal; Notable for the following components:   Lactic Acid, Venous 2.0 (*)    All other components within normal limits  LACTIC ACID, PLASMA - Abnormal; Notable for the following components:   Lactic Acid, Venous 2.8 (*)    All other components within normal limits  CBC WITH DIFFERENTIAL/PLATELET - Abnormal; Notable for  the following components:   WBC 36.1 (*)    RBC 2.94 (*)    Hemoglobin 8.3 (*)    HCT 27.4 (*)    RDW 16.3 (*)    Neutro Abs 33.1 (*)     Monocytes Absolute 1.4 (*)    Abs Immature Granulocytes 0.36 (*)    All other components within normal limits  PROTIME-INR - Abnormal; Notable for the following components:   Prothrombin Time 16.1 (*)    INR 1.3 (*)    All other components within normal limits  URINALYSIS, ROUTINE W REFLEX MICROSCOPIC - Abnormal; Notable for the following components:   APPearance CLOUDY (*)    Specific Gravity, Urine <1.005 (*)    pH >9.0 (*)    Bilirubin Urine SMALL (*)    Protein, ur 100 (*)    Nitrite POSITIVE (*)    Leukocytes,Ua MODERATE (*)    All other components within normal limits  URINALYSIS, MICROSCOPIC (REFLEX) - Abnormal; Notable for the following components:   Bacteria, UA MANY (*)    All other components within normal limits  CULTURE, BLOOD (ROUTINE X 2)  CULTURE, BLOOD (ROUTINE X 2)  URINE CULTURE  URINE CULTURE  PROCALCITONIN  URINALYSIS, ROUTINE W REFLEX MICROSCOPIC    EKG EKG Interpretation  Date/Time:  Saturday November 11 2019 14:42:25 EST Ventricular Rate:  83 PR Interval:    QRS Duration: 94 QT Interval:  378 QTC Calculation: 445 R Axis:   -14 Text Interpretation: Sinus rhythm Low voltage, precordial leads Nonspecific T abnormalities, lateral leads Since last tracing Atrial flutter RESOLVED SINCE PREVIOUS Otherwise no significant change Confirmed by Daleen Bo [id number]) on 10/28/2019 3:21:48 PM   Radiology DG Chest 2 View  Result Date: 11/12/2019 CLINICAL DATA:  Altered mental status EXAM: CHEST - 2 VIEW COMPARISON:  Chest radiograph dated 10/12/2019 FINDINGS: The patient is rotated to the left. The heart size is within normal limits. Vascular calcifications are seen in the aortic arch. Lucency over the left lung base may reflect a small pneumothorax on this supine exam or be secondary to patient positioning. There is no right pneumothorax. There is no pleural effusion or focal consolidation. Degenerative changes are seen in both shoulders. IMPRESSION: Lucency over  the left lung base may reflect a small pneumothorax on this supine exam or be secondary to patient positioning. This could be confirmed with an upright chest radiograph. These results will be called to the ordering clinician or representative by the Radiologist Assistant, and communication documented in the PACS or zVision Dashboard. Electronically Signed   By: Zerita Boers M.D.   On: 11/12/2019 12:41   DG Chest Port 1 View  Result Date: 11/13/2019 CLINICAL DATA:  Abnormal x-ray with concern for pneumothorax EXAM: PORTABLE CHEST 1 VIEW COMPARISON:  Yesterday FINDINGS: Kyphotic positioning with chin over the right more than left apex. No visible pneumothorax. When allowing for interstitial crowding there is no convincing airspace disease. Postoperative left axilla. Normal heart size and mediastinal contours. 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 There is asymmetric left-sided shoulder girdle osteopenia. Suspect findings are sequela of remote radiotherapy. IMPRESSION: No acute finding including evidence of pneumothorax. Electronically Signed   By: Monte Fantasia M.D.   On: 10/24/2019 13:22    Procedures .Critical Care Performed by: Daleen Bo, MD Authorized by: Daleen Bo, MD   Critical care provider statement:    Critical care time (minutes):  50   Critical care start time:  11/10/2019 11:25 AM   Critical care end time:  11/13/2019 2:39 PM   Critical care time was exclusive of:  Separately billable procedures and treating other patients   Critical care was necessary to treat or prevent imminent or life-threatening deterioration of the following conditions:  Circulatory failure   Critical care was time spent personally by me on the following activities:  Development of treatment plan with patient or surrogate, discussions with consultants, evaluation of patient's response to treatment, examination of patient, obtaining history from patient or surrogate, ordering and performing  treatments and interventions, ordering and review of laboratory studies, pulse oximetry, re-evaluation of patient's condition, review of old charts and ordering and review of radiographic studies  BLADDER CATHETERIZATION  Date/Time: 11/02/2019 3:16 PM Performed by: Daleen Bo, MD Authorized by: Daleen Bo, MD   Consent:    Consent given by:  Spouse   Risks discussed:  False passage and incomplete procedure   Alternatives discussed:  No treatment Pre-procedure details:    Procedure purpose:  Therapeutic Anesthesia (see MAR for exact dosages):    Anesthesia method:  None Procedure details:    Provider performed due to:  Nurse unable to complete   Catheter insertion:  Indwelling   Catheter type:  Foley   Catheter size:  16 Fr   Number of attempts:  1   Urine appearance: Purulent. Post-procedure details:    Patient tolerance of procedure:  Tolerated well, no immediate complications   (including critical care time)  Medications Ordered in ED Medications  vancomycin (VANCOREADY) IVPB 1250 mg/250 mL (1,250 mg Intravenous New Bag/Given 11/19/2019 1445)  metroNIDAZOLE (FLAGYL) IVPB 500 mg (0 mg Intravenous Stopped 11/03/2019 1437)  lactated ringers bolus 1,000 mL (0 mLs Intravenous Stopped 11/18/2019 1326)    And  lactated ringers bolus 500 mL (0 mLs Intravenous Stopped 11/06/2019 1327)    And  lactated ringers bolus 250 mL (0 mLs Intravenous Stopped 11/16/2019 1327)  ceFEPIme (MAXIPIME) 2 g in sodium chloride 0.9 % 100 mL IVPB (0 g Intravenous Stopped 11/04/2019 1250)  sodium zirconium cyclosilicate (LOKELMA) packet 10 g (10 g Oral Given 10/30/2019 1444)    ED Course  I have reviewed the triage vital signs and the nursing notes.  Pertinent labs & imaging results that were available during my care of the patient were reviewed by me and considered in my medical decision making (see chart for details).  Clinical Course as of Nov 10 1553  Sat Nov 11, 2019  1329 Abnormal, presence of red and  white cells with many bacteria  Urinalysis, Microscopic (reflex)(!) [EW]  1330 Abnormal, cloudy appearance, bilirubin present, protein present, nitrite present, leukocytes present  Urinalysis, Routine w reflex microscopic(!) [EW]  1330 Abnormal, potassium elevated, glucose high, BUN high, creatinine high, calcium low, total protein low, albumin low, AST high, alk phos stays high, GFR low  Comprehensive metabolic panel(!!) [EW]  4580 Slight elevation  Protime-INR(!) [EW]  1331 No evidence CHF or infiltrate, interpreted by me  DG Chest 2 View [EW]  1436 Urinary bladder had greater than 377 cc of urine in it,  I asked nurses to replace her catheter.   [EW]  32 The patient's husband arrived and states that the patient has been eating well up until yesterday.  She has been taking her usual feedings per PEG, and stooling normally, as recently as today.  He is concerned that she is less responsive than usual, at this time.  He states that her urinary catheter is due to be changed, soon.  Is has been in place since she  was discharged from the hospital.   [EW]  1459 Closure with patient's husband; he is in agreement with current plan of care, hospitalization, and anticipated discharge when she becomes stable.  He states that his daughter is an Engineer, manufacturing provider, and lives in the home with his wife and himself.  Findings discussed and all questions answered.   [EW]  1534 Case discussed with nephrologist, Dr. Marval Regal; he agrees to see the patient as a Optometrist.  He recommends hospitalist admission.   [EW]    Clinical Course User Index [EW] Daleen Bo, MD   MDM Rules/Calculators/A&P                       Patient Vitals for the past 24 hrs:  BP Temp Temp src Pulse Resp SpO2  11/03/2019 1548 (!) 93/55 -- -- 78 17 99 %  11/17/2019 1430 93/62 -- -- -- (!) 26 --  11/07/2019 1415 97/62 -- -- -- (!) 29 --  11/17/2019 1400 97/61 -- -- -- (!) 25 --  11/07/2019 1345 95/69 -- -- -- (!) 23 --   10/31/2019 1330 (!) 98/58 -- -- -- (!) 23 --  11/04/2019 1315 110/65 -- -- 84 (!) 28 100 %  10/23/2019 1245 107/68 -- -- -- 18 --  10/29/2019 1230 103/61 -- -- -- (!) 23 --  11/10/2019 1200 (!) 93/57 -- -- 86 (!) 27 100 %  10/23/2019 1145 (!) 72/46 -- -- 93 (!) 22 100 %  11/13/2019 1135 (!) 73/53 99.2 F (37.3 C) Rectal 94 (!) 29 100 %     Medical Decision Making: Altered mental status, nonspecific cause, with AKI, and urinary retention.  Patient with chronic severe dementia, and recent prolonged hospitalization.  Doubt severe sepsis.  Possible UTI.  AKI secondary to urinary retention with subsequent hyperkalemia.  Patient being covered with broad-spectrum antibiotics for infection, unknown source.  IV fluid given for low blood pressure;   CRITICAL CARE-yes Performed by: Daleen Bo   Nursing Notes Reviewed/ Care Coordinated Applicable Imaging Reviewed Interpretation of Laboratory Data incorporated into ED treatment  3:05 PM-consult hospitalist, who would like me to discuss case with nephrology, to see if she needs dialysis, prior to agreeing to admit the patient.  3:15 PM-Foley catheter placed by me with the patient in the right lateral decubitus position.  After removal of Foley catheter on arrival, she spontaneously voided.  Upon bladder catheterization, a moderate amount of frank purulent material was released around the catheter.  Foley catheter balloon deflated, and stabilized.  3:35 PM-Consult complete with hospitalist. Patient case explained and discussed.  He agrees to admit patient for further evaluation and treatment. Call ended at 3:54 PM   Final Clinical Impression(s) / ED Diagnoses Final diagnoses:  Sepsis with acute renal failure without septic shock, due to unspecified organism, unspecified acute renal failure type ([place])  Urinary retention  Hyperkalemia  Subtherapeutic anticoagulation    Rx / DC Orders ED Discharge Orders    None       Daleen Bo, MD 11/04/2019  1555

## 2019-11-11 NOTE — Progress Notes (Signed)
Pt's HR spiked up to 160, sinus tach. RN obtained EKG and notified E-Link RN. HR has returned to NSR but patient is still endorsing frequent PVCs.

## 2019-11-11 NOTE — Consult Note (Signed)
History and Physical    Barbara Flowers [id number] DOB: February 22, 1951 DOA: 11/12/2019  PCP: [name], NP Patient coming from: Home Requesting physician: Daleen Bo, MD  Chief Complaint: Decreased urination  HPI: Barbara Flowers is a 69 y.o. female with medical history significant of Shy-Drager syndrome, dementia, CVA with left-sided hemiplegia, breast cancer status post mastectomy, stage IV sacral decubitus ulcer.  History provided by patient's husband.  Patient unable to provide history secondary to mental status change.  Patient presented secondary to 2 days of worsening mental status in addition to inability to urinate.  Mental status has been worsening for the past 2 days.  Patient is usually conversant but has been more somnolent which has been progressively worsening.  Her husband also notes that her urine output was decreasing to the point you are today, he noticed that she had no urine output.  Yesterday, he mentions that she was started on Levaquin in hopes of treating a UTI to help improve her symptoms.  ED Course: Vitals: T-max of 99.2, pulse of 94, respirations between 25 and 29, blood pressure initially 73/53 and improved to 80s over 40s at bedside; documented 90s over 60s on the chart Labs: Potassium 7.1, BUN of 164, creatinine 4.27, alkaline phosphatase of 179, AST of 58, lactic acid of 2.0> 2.8, WBC of 36,100 with predominant neutrophils (33,100), INR of 1.3 Imaging: Initial chest x-ray concerning for pneumothorax which was clear on second chest x-ray Medications/Course: 30 cc/kg LR fluids (1,750 mL), Vancomycin, Cefepime, Flagyl  Review of Systems: Review of Systems  Unable to perform ROS: Mental status change    Past Medical History:  Diagnosis Date  . Anemia   . Anxiety disorder   . Blood transfusion without reported diagnosis   . Brain cancer [facility])    s/p sterotactic radio surgery  . Brain tumor ([place])   . Breast cancer ([place]) 1991   history of breast cancer  status post left lumpectomy, chemotherapy, and radiation in the 1990s  . Cerebrovascular disease   . Dementia ([place])   . Gastric bypass status for obesity   . H/O bone marrow transplant ([place])   . Hypothyroidism   . IBS (irritable bowel syndrome)   . MDD (major depressive disorder)   . Memory loss   . Obesity   . Orthostatic hypotension   . Seizures ([place])   . 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[place])   . Stroke [facility]) 2005   hemorrhagic  . Subdural hematoma ([place])   . Sundowning   . TIA (transient ischemic attack) 2016  . Upper brachial plexus paralysis syndrome   . Urinary incontinence     Past Surgical History:  Procedure Laterality Date  . BONE MARROW TRANSPLANT  1991  . BREAST LUMPECTOMY Left 1991   post chemotherapy and radiation  . CHOLECYSTECTOMY    . CRANIOTOMY FOR HEMISPHERECTOMY TOTAL / PARTIAL Right 2010   Brain Metastasis - right temporal lobe - excised  . GASTRIC BYPASS  1985  . HAND SURGERY Left 2015  . HAND TENDON SURGERY Left    for brachial plexus injury  . INCISIONAL HERNIA REPAIR N/A 10/18/2019   Procedure: LAPAROSCOPIC INCISIONAL HERNIA REPAIR;  Surgeon: Michael Boston, MD;  Location: [facility] ORS;  Service: General;  Laterality: N/A;  . LAPAROSCOPIC GASTROSTOMY N/A 10/18/2019   Procedure: LAPAROSCOPIC GASTROSTOMY;  Surgeon: Michael Boston, MD;  Location: [facility]S;  Service: General;  Laterality: N/A;  . LAPAROSCOPIC LYSIS OF ADHESIONS N/A 10/18/2019   Procedure: LAPAROSCOPIC LYSIS OF ADHESIONS;  Surgeon: Michael Boston, MD;  Location: [facility];  Service: General;  Laterality: N/A;  . LYMPH NODE BIOPSY       reports that she has never smoked. She has never used smokeless tobacco. She reports current alcohol use. She reports that she does not use drugs.  Allergies  Allergen Reactions  . Gabapentin Other (See Comments)    seizure  . Tramadol Other (See Comments)    Brings on Seizures     Family History  Problem Relation Age of Onset  . Hypertension Mother   . Stroke Mother   .  Heart disease Father   . Hypertension Father    Prior to Admission medications   Medication Sig Start Date End Date Taking? Authorizing Provider  acetaminophen (TYLENOL) 160 MG/5ML solution Place 20.3 mLs (650 mg total) into feeding tube every 4 (four) hours as needed for fever. 10/21/19   [name], MD  ALPRAZolam Duanne Moron) 1 MG tablet Take 1 mg by mouth at bedtime as needed for anxiety.    [provider]  Amino Acids-Protein Hydrolys (FEEDING SUPPLEMENT, PRO-STAT SUGAR FREE 64,) LIQD Place 30 mLs into feeding tube daily. 10/22/19   [name], MD  Ascorbic Acid (VITAMIN C) 1000 MG tablet Take 1,000 mg by mouth daily.    [provider]  aspirin EC 81 MG tablet Take 81 mg by mouth daily.    [provider]  atorvastatin (LIPITOR) 20 MG tablet Take 20 mg by mouth at bedtime. 09/05/19   [provider]  Cholecalciferol (VITAMIN D3) 50 MCG (2000 UT) TABS Take 2,000 Units by mouth daily.    [provider]  collagenase (SANTYL) ointment Apply topically daily. 10/22/19   [name], MD  ferrous sulfate 325 (65 FE) MG EC tablet Take 325 mg by mouth daily with breakfast.    [provider]  levofloxacin (LEVAQUIN) 750 MG tablet Take 750 mg by mouth daily. 11/10/19   [provider]  midodrine (PROAMATINE) 10 MG tablet Take 10 mg by mouth 3 (three) times daily.     [provider]  Multiple Vitamins-Minerals (MULTIVITAMIN WITH MINERALS) tablet Take 1 tablet by mouth daily.    [provider]  Nutritional Supplements (FEEDING SUPPLEMENT, OSMOLITE 1.5 CAL,) LIQD Place 1,000 mLs into feeding tube continuous. 10/21/19   [name], MD  pantoprazole (PROTONIX) 40 MG tablet Take 40 mg by mouth daily. 08/29/19   [provider]  Potassium 99 MG TABS Take 99 mg by mouth daily.    [provider]  Water For Irrigation, Sterile (FREE WATER) SOLN Place 100 mLs into feeding tube every 4 (four) hours. 10/21/19    [name], MD    Physical Exam:  Physical Exam Constitutional:      General: She is not in acute distress.    Appearance: She is underweight. She is not ill-appearing or diaphoretic.  HENT:     Mouth/Throat:     Mouth: Mucous membranes are dry.  Eyes:     Conjunctiva/sclera: Conjunctivae normal.     Pupils: Pupils are equal, round, and reactive to light.  Cardiovascular:     Rate and Rhythm: Normal rate and regular rhythm.     Heart sounds: Normal heart sounds. No murmur.  Pulmonary:     Effort: Pulmonary effort is normal. No respiratory distress.     Breath sounds: Normal breath sounds. No wheezing or rales.  Abdominal:     General: Bowel sounds are normal. There is no distension.     Palpations: Abdomen is  soft.     Tenderness: There is no abdominal tenderness. There is no guarding or rebound.  Musculoskeletal:        General: No tenderness. Normal range of motion.     Left forearm: Edema present.     Cervical back: Normal range of motion.  Lymphadenopathy:     Cervical: No cervical adenopathy.  Skin:    General: Skin is warm and dry.     Capillary Refill: Capillary refill takes more than 3 seconds.     Comments: Sacral decubitus ulcer without surrounding erythema or any noted drainage  Neurological:     Mental Status: She is lethargic.    Labs on Admission: I have personally reviewed following labs and imaging studies  CBC: Recent Labs  Lab 11/09/2019 1151  WBC 36.1*  NEUTROABS 33.1*  HGB 8.3*  HCT 27.4*  MCV 93.2  PLT [id number]    Basic Metabolic Panel: Recent Labs  Lab 11/09/2019 1151  NA 140  K 7.1*  CL 102  CO2 24  GLUCOSE 102*  BUN 164*  CREATININE 4.27*  CALCIUM 8.2*    GFR: CrCl cannot be calculated (Unknown ideal weight.).  Liver Function Tests: Recent Labs  Lab 11/01/2019 1151  AST 58*  ALT 43  ALKPHOS 179*  BILITOT 0.7  PROT 5.9*  ALBUMIN 1.9*   No results for input(s): LIPASE, AMYLASE in the last 168 hours. No results for  input(s): AMMONIA in the last 168 hours.  Coagulation Profile: Recent Labs  Lab 11/15/2019 1151  INR 1.3*    Cardiac Enzymes: No results for input(s): CKTOTAL, CKMB, CKMBINDEX, TROPONINI in the last 168 hours.  BNP (last 3 results) No results for input(s): PROBNP in the last 8760 hours.  HbA1C: No results for input(s): HGBA1C in the last 72 hours.  CBG: No results for input(s): GLUCAP in the last 168 hours.  Lipid Profile: No results for input(s): CHOL, HDL, LDLCALC, TRIG, CHOLHDL, LDLDIRECT in the last 72 hours.  Thyroid Function Tests: No results for input(s): TSH, T4TOTAL, FREET4, T3FREE, THYROIDAB in the last 72 hours.  Anemia Panel: No results for input(s): VITAMINB12, FOLATE, FERRITIN, TIBC, IRON, RETICCTPCT in the last 72 hours.  Urine analysis:    Component Value Date/Time   COLORURINE ORANGE (A) 11/09/2019 1555   APPEARANCEUR TURBID (A) 11/02/2019 1555   LABSPEC 1.015 10/30/2019 1555   PHURINE 8.5 (H) 11/02/2019 1555   GLUCOSEU NEGATIVE 10/26/2019 1555   HGBUR LARGE (A) 11/07/2019 1555   BILIRUBINUR NEGATIVE 11/01/2019 [place] 11/06/2019 1555   PROTEINUR 100 (A) 11/06/2019 1555   NITRITE NEGATIVE 11/03/2019 1555   LEUKOCYTESUR LARGE (A) 10/29/2019 1555     Radiological Exams on Admission: DG Chest 2 View  Result Date: 11/08/2019 CLINICAL DATA:  Altered mental status EXAM: CHEST - 2 VIEW COMPARISON:  Chest radiograph dated 10/12/2019 FINDINGS: The patient is rotated to the left. The heart size is within normal limits. Vascular calcifications are seen in the aortic arch. Lucency over the left lung base may reflect a small pneumothorax on this supine exam or be secondary to patient positioning. There is no right pneumothorax. There is no pleural effusion or focal consolidation. Degenerative changes are seen in both shoulders. IMPRESSION: Lucency over the left lung base may reflect a small pneumothorax on this supine exam or be secondary to  patient positioning. This could be confirmed with an upright chest radiograph. These results will be called to the ordering clinician or representative by the Radiologist Assistant, and  communication documented in the PACS or zVision Dashboard. Electronically Signed   By: Zerita Boers M.D.   On: 10/25/2019 12:41   DG Chest Port 1 View  Result Date: 10/31/2019 CLINICAL DATA:  Abnormal x-ray with concern for pneumothorax EXAM: PORTABLE CHEST 1 VIEW COMPARISON:  Yesterday FINDINGS: Kyphotic positioning with chin over the right more than left apex. No visible pneumothorax. When allowing for interstitial crowding there is no convincing airspace disease. Postoperative left axilla. Normal heart size and mediastinal contours. 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 There is asymmetric left-sided shoulder girdle osteopenia. Suspect findings are sequela of remote radiotherapy. IMPRESSION: No acute finding including evidence of pneumothorax. Electronically Signed   By: Monte Fantasia M.D.   On: 11/07/2019 13:22    EKG: Independently reviewed.  Sinus rhythm.  1, aVL with nonspecific T wave changes which have remained unchanged from previous EKG 1 month prior.  Assessment/Plan   Septic shock Present on admission.  Patient is not responding well to initial fluid resuscitation.  Patient received 30cc/kg.  On examination, blood pressures of 80s over 40s with a MAP of 54.  Capillary refills about 4 seconds.  Lactic acid increasing from 2-2.8 after IV fluid bolus. -Critical care consult for possible ICU admission and pressors -Continue IV fluids and watch fluid status carefully secondary to history of heart failure with reduced EF -Repeat lactic acid, check procalcitonin,  Complicated UTI CAUTI Present on admission.  Patient has a chronic indwelling Foley.  Urine cultures obtained and are pending. -Continue vancomycin and cefepime -Discontinue Flagyl  AKI Severe.  Patient with associated  hyperkalemia with a potassium 7.1.  Obtained a renal ultrasound which shows no hydronephrosis.  All of this could be related to urinary retention, complicated by patient's hypotension noted on arrival.  Patient did have large urinary output after Foley was exchanged.  Patient received Lokelma in the ED. -Nephrology recommendations -Insulin 5 units with dextrose, 1 amp bicarb -Watch urine output -Repeat BMP and check potassium every 4 hours  Hyperkalemia Likely secondary to AKI, however, patient is also on potassium supplementation -Management above -Hold potassium supplementation  Chronic hypotension Patient is on midodrine as an outpatient. -Continue midodrine per tube  Chronic systolic heart failure Patient had an EF as low as 25 to 35% 1 month ago.  On repeat echocardiogram, it appears her EF improved to 45% to 50%.  Weight is still pending this admission.  Chest x-ray does not show fluid overload at this time.  Patient is on room air. patietn is not on ACEi or betablocker likely secondary to chronic hypotension. -Daily weights, strict in and out -Watch fluid status carefully  Frequent aspiration Complicated previous admission. Patient required intubation during that admission. PEG tube placed to allow nutrition in setting of high aspiration risk. Peg tube in place with no complications noted. -Continue tube feeds  History of aspiration -Aspiration precautions  Hyperlipidemia On Lipitor 20 mg daily -Continue  GERD On Protonix 40 mg daily. Unsure if history of esophagitis -Will need to consider if this should be continued in light of high aspiration/pneumonia risk.  Sacral decubitus ulcer, POA -Wound care   DVT prophylaxis: Heparin subcu Code Status: Full code Family Communication: Husband at bedside Disposition Plan: ICU Consults called: Critical care, nephrology Admission status: Inpatient   Cordelia Poche, MD [facility] 10/31/2019, 5:00 PM

## 2019-11-11 NOTE — Progress Notes (Signed)
A consult was received from an ED physician for Vancomycin and Cefepime per pharmacy dosing.    The patient's profile has been reviewed for ht/wt/allergies/indication/available labs.    A one time order has been placed for Vancomycin 1250mg  IV and Cefepime 2gm IV.    Further antibiotics/pharmacy consults should be ordered by admitting physician if indicated.                       Thank you, Everette Rank, PharmD 11/10/2019  11:44 AM

## 2019-11-11 NOTE — Progress Notes (Signed)
Pharmacy Antibiotic Note  Barbara Flowers is a 69 y.o. female admitted on 11/12/2019 with sepsis, UTI and infected decubitus ulcer.  Pharmacy has been consulted for vanc/cefepime dosing. Flagyl per Md  Plan:  Vanc 1250mg  IV x 1, will not ordered scheduled doses of vanc due to acute renal failure, will order vanc levels as needed and redose when appropriate  Cefepime 2g IV q24  Weight: 126 lb 12.2 oz (57.5 kg)  Temp (24hrs), Avg:99.2 F (37.3 C), Min:99.2 F (37.3 C), Max:99.2 F (37.3 C)  Recent Labs  Lab 11/01/2019 1151 10/23/2019 1153 11/10/2019 1430  WBC 36.1*  --   --   CREATININE 4.27*  --   --   LATICACIDVEN  --  2.0* 2.8*    Estimated Creatinine Clearance: 11.3 mL/min (A) (by C-G formula based on SCr of 4.27 mg/dL (H)).    Allergies  Allergen Reactions  . Gabapentin Other (See Comments)    seizure  . Tramadol Other (See Comments)    Brings on Seizures      Thank you for allowing pharmacy to be a part of this patient's care.  Kara Mead 10/29/2019 6:11 PM

## 2019-11-11 NOTE — ED Triage Notes (Addendum)
Pt presents from home with altered mental status. Decreased urinary output. Hx of stage IV decubitus ulcer on sacrum and recent hx of PNA. Meets sepsis criteria per EMS. Per EMS, family states they started levaquin yesterday to ward off possible UTI.

## 2019-11-11 NOTE — ED Notes (Signed)
hospitalist paged to change route of medication administration.

## 2019-11-11 NOTE — Progress Notes (Signed)
Notified bedside nurse of need to draw repeat lactic acid in 2 hours since it increased.

## 2019-11-11 NOTE — ED Notes (Signed)
Attempted to call report for second time will await call back

## 2019-11-11 NOTE — ED Notes (Signed)
Assisted NT in insertion of new foley catheter, placed new sacral foam, and pt linens changed

## 2019-11-12 ENCOUNTER — Other Ambulatory Visit: Payer: Self-pay

## 2019-11-12 LAB — BASIC METABOLIC PANEL
Anion gap: 13 (ref 5–15)
BUN: 136 mg/dL — ABNORMAL HIGH (ref 8–23)
CO2: 22 mmol/L (ref 22–32)
Calcium: 9 mg/dL (ref 8.9–10.3)
Chloride: 106 mmol/L (ref 98–111)
Creatinine, Ser: 2.71 mg/dL — ABNORMAL HIGH (ref 0.44–1.00)
GFR calc Af Amer: 20 mL/min — ABNORMAL LOW (ref >60)
GFR calc non Af Amer: 17 mL/min — ABNORMAL LOW (ref >60)
Glucose, Bld: 74 mg/dL (ref 70–99)
Potassium: 5.2 mmol/L — ABNORMAL HIGH (ref 3.5–5.1)
Sodium: 141 mmol/L (ref 135–145)

## 2019-11-12 LAB — HIV ANTIBODY (ROUTINE TESTING W REFLEX): HIV Screen 4th Generation wRfx: NONREACTIVE — AB

## 2019-11-12 LAB — PHOSPHORUS
Phosphorus: 3.4 mg/dL (ref 2.5–4.6)
Phosphorus: 4 mg/dL (ref 2.5–4.6)
Phosphorus: 4.5 mg/dL (ref 2.5–4.6)

## 2019-11-12 LAB — URINE CULTURE: Culture: <10000 — AB

## 2019-11-12 LAB — CBC
HCT: 29.8 % — ABNORMAL LOW (ref 36.0–46.0)
Hemoglobin: 8.7 g/dL — ABNORMAL LOW (ref 12.0–15.0)
MCH: 28.2 pg (ref 26.0–34.0)
MCHC: 29.2 g/dL — ABNORMAL LOW (ref 30.0–36.0)
MCV: 96.4 fL (ref 80.0–100.0)
Platelets: 218 10*3/uL (ref 150–400)
RBC: 3.09 MIL/uL — ABNORMAL LOW (ref 3.87–5.11)
RDW: 16.6 % — ABNORMAL HIGH (ref 11.5–15.5)
WBC: 33.7 10*3/uL — ABNORMAL HIGH (ref 4.0–10.5)
nRBC: 0 % (ref 0.0–0.2)

## 2019-11-12 LAB — MAGNESIUM
Magnesium: 2.2 mg/dL (ref 1.7–2.4)
Magnesium: 2.2 mg/dL (ref 1.7–2.4)
Magnesium: 1.8 mg/dL (ref 1.7–2.4)

## 2019-11-12 LAB — GLUCOSE, CAPILLARY
Glucose-Capillary: 92 mg/dL (ref 70–99)
Glucose-Capillary: 129 mg/dL — ABNORMAL HIGH (ref 70–99)
Glucose-Capillary: 151 mg/dL — ABNORMAL HIGH (ref 70–99)

## 2019-11-12 LAB — CORTISOL: Cortisol, Plasma: 22 ug/dL

## 2019-11-12 LAB — SARS CORONAVIRUS 2 (TAT 6-24 HRS): SARS Coronavirus 2: NEGATIVE

## 2019-11-12 LAB — POTASSIUM
Potassium: 4.8 mmol/L (ref 3.5–5.1)
Potassium: 4.8 mmol/L (ref 3.5–5.1)
Potassium: 4.3 mmol/L (ref 3.5–5.1)

## 2019-11-12 MED ORDER — HYDROCORTISONE NA SUCCINATE PF 100 MG IJ SOLR
50.0000 mg | Freq: Four times a day (QID) | INTRAMUSCULAR | Status: DC
  Administered 2019-11-12 – 2019-11-14 (×10): 50 mg via INTRAVENOUS
  Filled 2019-11-12 (×10): qty 2

## 2019-11-12 MED ORDER — SODIUM CHLORIDE 0.9 % IV SOLN: INTRAVENOUS | Status: DC

## 2019-11-12 MED ORDER — PRO-STAT SUGAR FREE PO LIQD
30.0000 mL | Freq: Two times a day (BID) | ORAL | Status: DC
  Administered 2019-11-12 – 2019-11-17 (×11): 30 mL
  Filled 2019-11-12 (×13): qty 30

## 2019-11-12 MED ORDER — CALCIUM GLUCONATE-NACL 2-0.675 GM/100ML-% IV SOLN
2.0000 g | Freq: Once | INTRAVENOUS | Status: AC
  Administered 2019-11-12: 02:00:00 2000 mg via INTRAVENOUS
  Filled 2019-11-12: qty 100

## 2019-11-12 MED ORDER — ACETAMINOPHEN 650 MG RE SUPP
650.0000 mg | RECTAL | Status: DC | PRN
  Filled 2019-11-12: qty 1

## 2019-11-12 MED ORDER — MUPIROCIN 2 % EX OINT
TOPICAL_OINTMENT | Freq: Two times a day (BID) | CUTANEOUS | Status: DC
  Administered 2019-11-18 – 2019-11-25 (×6): 1 via NASAL
  Filled 2019-11-12 (×5): qty 22

## 2019-11-12 MED ORDER — VITAL HIGH PROTEIN PO LIQD
1000.0000 mL | ORAL | Status: DC
  Administered 2019-11-12: 14:00:00 1000 mL

## 2019-11-12 MED ORDER — AMIODARONE HCL IN DEXTROSE 360-4.14 MG/200ML-% IV SOLN
60.0000 mg/h | INTRAVENOUS | Status: DC
  Administered 2019-11-12 (×2): 60 mg/h via INTRAVENOUS
  Filled 2019-11-12: qty 200

## 2019-11-12 MED ORDER — SODIUM ZIRCONIUM CYCLOSILICATE 10 G PO PACK
10.0000 g | PACK | Freq: Three times a day (TID) | ORAL | Status: DC
  Administered 2019-11-12: 02:00:00 10 g via ORAL
  Filled 2019-11-12 (×2): qty 1

## 2019-11-12 MED ORDER — AMIODARONE LOAD VIA INFUSION
150.0000 mg | Freq: Once | INTRAVENOUS | Status: AC
  Administered 2019-11-12: 150 mg via INTRAVENOUS
  Filled 2019-11-12 (×2): qty 83.34

## 2019-11-12 MED ORDER — AMIODARONE HCL IN DEXTROSE 360-4.14 MG/200ML-% IV SOLN
30.0000 mg/h | INTRAVENOUS | Status: DC
  Administered 2019-11-12: 14:00:00 30 mg/h via INTRAVENOUS
  Filled 2019-11-12: qty 200

## 2019-11-12 NOTE — Progress Notes (Signed)
[place] Progress Note Patient Name: Barbara Flowers DOB: [birth year] MRN: [id number]   Date of Service  11/12/2019  HPI/Events of Note  Patient is on levophed 10 mcg/min peripherally. Has some soft BPs on this but currently 1111/54 (MAP 75).  K 6.1 (rising) in setting of renal failure.   eICU Interventions  Start hydrocortisone 50mg  Q6H (stress dose steroids).  Start Lokelma per G tube for hyperkalemia.  Will also give 2g calcium gluconate for hyperkalemia and may also help her pressor requirement.     Intervention Category Major Interventions: Hypotension - evaluation and management;Electrolyte abnormality - evaluation and management  [name] Maryjean Corpening 11/12/2019, 1:05 AM

## 2019-11-12 NOTE — Progress Notes (Signed)
PT. Sacral wound has severely gotten worse since last admission when RN had pt. Pt. Came in with matts in her hair, large dry patches on her mouth indicating lack of oral care, and many sores on feet and evidence of poor hygiene. Social work should be involved with pt. And consider different approaches of care after discharge.

## 2019-11-12 NOTE — Progress Notes (Signed)
[place] Progress Note Patient Name: Carmia Skok DOB: 06-13-1951 MRN: [id number]   Date of Service  11/12/2019  HPI/Events of Note  Afib with RVR up to 150s. SBP 90s. On levo 8 mcg/min.   eICU Interventions  Will order amio 150mg  IV bolus then start an amiodarone infusion.     Intervention Category Major Interventions: Arrhythmia - evaluation and management  Charlott Rakes 11/12/2019, 6:19 AM

## 2019-11-12 NOTE — Progress Notes (Signed)
Pt. HR 144 this AM nurse gave amiodarone bolus and started drip. Drip was turned of at 1344 due to HR in the 60's. RN will continue to monitor closely.

## 2019-11-12 NOTE — Progress Notes (Addendum)
NAME:  Barbara Flowers, MRN:  [id number], DOB:  December 23, 1950, LOS: 1 ADMISSION DATE:  11/02/2019, CONSULTATION DATE:  11/10/2019 REFERRING MD: Gardenia Phlegm MD, CHIEF COMPLAINT: Septic shock  Brief History   69 year old with complicated history as noted below with recent admission from 12/26 to 10/21/2019 for respiratory failure, MRSA pneumonia, CHF.  Returns to hospital with low urine output, altered mental status. She was started on levaquin as an outpatient for UTI but continued to worsen. In ED she has septic shock from indwelling blocked Foley (which was replaced), urosepsis with AKI, hyperkalemia.    Past Medical History  Breast cancer status postmastectomy, brain aneurysm, dementia, CVA with left hemiplegia, cardiac contractures, stage IV sacral decub, Shy-Drager syndrome, LV thrombus, paroxysmal atrial fibrillation/flutter  Significant Hospital Events   2/20- Admit amount peripheral levo 2/21-rapid A. fib-given amnio bolus  Consults:  PCCM, Palliative  Procedures:    Significant Diagnostic Tests:  Renal ultrasound 2/20-no hydronephrosis  Chest x-ray 2/20-possible small pneumothorax. Chest x-ray 2/20-no lung infiltrate or acute findings.  No pneumothorax noted  Micro Data:  Blood cultures 2/20-  Urine Cultures 2/20-  Antimicrobials:  Flagyl 2/21 >> Vanco 2/21 >> Cefepime 2/21 >>  Interim history/subjective:  Went into rapid A. fib today morning.  Was given amnio bolus and converted back to sinus Remains on levo at 8 via peripheral Urine has picked up with purulent output  Objective   Blood pressure 98/66, pulse (!) 135, temperature 98.4 F (36.9 C), temperature source Axillary, resp. rate (!) 44, height 5\' 8"  (1.727 m), weight 56.8 kg, SpO2 (!) 78 %.        Intake/Output Summary (Last 24 hours) at 11/12/2019 0746 Last data filed at 11/12/2019 0600 Gross per 24 hour  Intake 3071.19 ml  Output 1025 ml  Net 2046.19 ml   Filed Weights   11/18/2019 1800 11/07/2019  2100 11/12/19 0500  Weight: 57.5 kg 56.7 kg 56.8 kg    Examination: Gen:      Chronically ill-appearing, frail HEENT:  EOMI, sclera anicteric Neck:     No masses; no thyromegaly Lungs:    Scattered rhonchi CV:         Regular rate and rhythm; no murmurs Abd:      PEG tube, soft nontender, nondistended Ext:    No edema; adequate peripheral perfusion Skin:      Warm and dry; no rash Neuro: Awake, nonverbal, moans to stimulus and questions  Labs significant for potassium 5.2, BUN/creatinine 136/2.71, lactic acid 3.6 WBC 33.7, cortisol 22 No new imaging  Resolved Hospital Problem list     Assessment & Plan:  Septic shock secondary to UTI from indwelling Foley's Continue gentle IV fluids Levophed via peripheral IV, stress dose steroids Hope to avoid central line if we can Follow lactic acid Broad antibiotic with vancomycin, cefepime.  Flagyl to cover possible anerobic infection in decub ulcer  AKI, hyperkalemia > improving Secondary to sepsis, urinary retention Monitor labs Nephrology on board  Chronic hypertension Midodrine per tube  Chronic CHF Paroxysmal atrial fibrillation EF was previously 20 to 35%.  Improved to 45% on discharge at last admission Continue amiodarone.  Will avoid heparin as she is converted back to normal sinus rhythm  Recurrent aspiration, chronic protein energy malnutrition Feeding tubes via PEG tube Nutrition consult  Sacral decub stage IV Wound care consult  Goals of care Discussed with husband at bedside and son who is a neurosurgery resident over telephone. Patient previously had palliative involved with goals of care discussion  At present they are okay with ICU admission, pressors, central line if needed. They are not sure about intubation or CPR and want to discuss further about it Palliative care consulted  Best practice:  Diet: Tube feed Pain/Anxiety/Delirium protocol (if indicated): NA VAP protocol (if indicated): NA DVT  prophylaxis: Heparin SQ GI prophylaxis: PPI Glucose control: [place] Mobility: Bed Code Status: Full Family Communication: As above. Husband last updated 2/21 Disposition: ICU  Critical care time:   The patient is critically ill with multiple organ system failure and requires high complexity decision making for assessment and support, frequent evaluation and titration of therapies, advanced monitoring, review of radiographic studies and interpretation of complex data.   Critical Care Time devoted to patient care services, exclusive of separately billable procedures, described in this note is 35  minutes.   Marshell Garfinkel MD [facility] Please see [email or web address] for pager details.  11/12/2019, 7:50 AM

## 2019-11-12 NOTE — Progress Notes (Signed)
eLink Physician-Brief Progress Note Patient Name: Barbara Flowers DOB: [birth year] MRN: [id number]   Date of Service  11/12/2019  HPI/Events of Note  Request for something for pain mgmt.  eICU Interventions  Ordered acetaminophen 650mg  PR.     Intervention Category Intermediate Interventions: Pain - evaluation and management  [name] [name] 11/12/2019, 4:14 AM

## 2019-11-12 NOTE — Progress Notes (Signed)
[facility] Progress Note    Assessment/ Plan:   Assessment/Plan:  1.  AKI- presumably due to combination of ischemic ATN due to sepsis/volume depletion and bladder outlet obstruction.  Improved with supportive care.  She is not a candidate for dialysis given her poor functional and nutritional status and hopefully this can be managed medically as her renal function was excellent at time of discharge on 10/21/19.  Nothing further to add today- we will sign off.  Please call with questions.  2.  Hyperkalemia- resolved 3.  Septic shock from UTI from chronic indwelling foley catheter.  pressors/ abx per PCCM 4.  Anemia- presumably due to chronic illness 5.  Recurrent aspiration and chronic malnutrition- feedings via PEG tube 6.  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  Still with some debris in new foley and may benefit from a 3 way foley with bladder irrigation and Urology evaluation. 8.  Stage IV sacral decubitus ulcer- had bowel incontinence and pt was cleaned and new dressing applied.  Will need ongoing wound care. 9.  Disposition- poor overall prognosis and quality of life.  Agree with palliative care consult to help set goals/limits of care.  Subjective:    K, Cr, and BUN all better.  Still on pressor.  Updated husband at bedside.     Objective:   BP (!) 114/59 (BP Location: Left Arm)   Pulse (!) 125   Temp 98 F (36.7 C)   Resp 17   Ht 5\' 8"  (1.727 m)   Wt 56.8 kg   SpO2 (!) 79% Comment: incorrect  BMI 19.04 kg/m   Intake/Output Summary (Last 24 hours) at 11/12/2019 1448 Last data filed at 11/12/2019 0800 Gross per 24 hour  Intake 3346.31 ml  Output 1025 ml  Net 2321.31 ml   Weight change:   Physical Exam: Gen: NAD, unresponsive CVS: tachycardic Resp: clear bilaterally Abd: nondistended Ext: no LE edema NEURO: unresponsive MSK: sarcopenic with contractures  Imaging: DG Chest  2 View  Result Date: 10/26/2019 CLINICAL DATA:  Altered mental status EXAM: CHEST - 2 VIEW COMPARISON:  Chest radiograph dated 10/12/2019 FINDINGS: The patient is rotated to the left. The heart size is within normal limits. Vascular calcifications are seen in the aortic arch. Lucency over the left lung base may reflect a small pneumothorax on this supine exam or be secondary to patient positioning. There is no right pneumothorax. There is no pleural effusion or focal consolidation. Degenerative changes are seen in both shoulders. IMPRESSION: Lucency over the left lung base may reflect a small pneumothorax on this supine exam or be secondary to patient positioning. This could be confirmed with an upright chest radiograph. These results will be called to the ordering clinician or representative by the Radiologist Assistant, and communication documented in the PACS or zVision Dashboard. Electronically Signed   By: Zerita Boers M.D.   On: 10/24/2019 12:41   US RENAL  Result Date: 11/19/2019 CLINICAL DATA:  69 year old female with altered mental status. Acute renal insufficiency. EXAM: RENAL / URINARY TRACT ULTRASOUND COMPLETE COMPARISON:  None. FINDINGS: Evaluation is limited due to body habitus and overlying bowel gas. Right Kidney: Renal measurements: 10.2 x 4.5 x 5.2 cm = volume: 125 mL. Normal echogenicity. No hydronephrosis or shadowing stone. Left Kidney: Renal measurements: 9.6 x 4.9 x 4.5 cm = volume: 111 mL. Normal echogenicity. No hydronephrosis or shadowing stone. Bladder: The urinary bladder is partially distended despite presence of  a Foley catheter. Other: None. IMPRESSION: 1. No hydronephrosis or shadowing stone. 2. Partially distended urinary bladder despite presence of a Foley catheter. Electronically Signed   By: Anner Crete M.D.   On: 10/25/2019 17:11   DG Chest Port 1 View  Result Date: 11/15/2019 CLINICAL DATA:  Abnormal x-ray with concern for pneumothorax EXAM: PORTABLE CHEST 1 VIEW  COMPARISON:  Yesterday FINDINGS: Kyphotic positioning with chin over the right more than left apex. No visible pneumothorax. When allowing for interstitial crowding there is no convincing airspace disease. Postoperative left axilla. Normal heart size and mediastinal contours. 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 There is asymmetric left-sided shoulder girdle osteopenia. Suspect findings are sequela of remote radiotherapy. IMPRESSION: No acute finding including evidence of pneumothorax. Electronically Signed   By: Monte Fantasia M.D.   On: 11/17/2019 13:22    Labs: BMET Recent Labs  Lab 11/13/2019 1151 11/03/2019 1740 10/25/2019 1800 10/26/2019 2231 11/12/19 0245 11/12/19 0621 11/12/19 0825 11/12/19 1158  NA 140 140 141  --  141  --   --   --   K 7.1* 6.1* 5.2* 6.1* 5.2* 4.8 4.8  --   CL 102 104 103  --  106  --   --   --   CO2 24 23 25   --  22  --   --   --   GLUCOSE 102* 90 129*  --  74  --   --   --   BUN 164* 155* 142*  --  136*  --   --   --   CREATININE 4.27* 3.54* 3.34*  --  2.71*  --   --   --   CALCIUM 8.2* 7.9* 8.7*  --  9.0  --   --   --   PHOS  --   --  3.2  --  3.4  --   --  4.0   CBC Recent Labs  Lab 10/25/2019 1151 10/24/2019 1800 11/12/19 0245  WBC 36.1* 28.0* 33.7*  NEUTROABS 33.1*  --   --   HGB 8.3* 7.9* 8.7*  HCT 27.4* 26.7* 29.8*  MCV 93.2 94.0 96.4  PLT 239 187 218    Medications:    . Chlorhexidine Gluconate Cloth  6 each Topical Daily  . feeding supplement (PRO-STAT SUGAR FREE 64)  30 mL Per Tube BID  . feeding supplement (VITAL HIGH PROTEIN)  1,000 mL Per Tube Q24H  . heparin  5,000 Units Subcutaneous Q8H  . hydrocortisone sod succinate (SOLU-CORTEF) inj  50 mg Intravenous Q6H  . mouth rinse  15 mL Mouth Rinse BID  . midodrine  10 mg Per Tube TID WC  . mupirocin ointment   Nasal BID  . pantoprazole sodium  40 mg Per Tube Daily  . vancomycin variable dose per unstable renal function (pharmacist dosing)   Does not apply See admin instructions       Madelon Lips, MD 11/12/2019, 2:48 PM

## 2019-11-12 NOTE — Consult Note (Signed)
Consultation Note Date: 11/12/2019   Patient Name: Barbara Flowers  DOB: May 18, 1951  MRN: [id number]  Age / Sex: 69 y.o., female  PCP: [name], NP Referring Physician: Marshell Garfinkel, MD  Reason for Consultation: Establishing goals of care  HPI/Patient Profile: 69 y.o. female admitted on 11/04/2019    Clinical Assessment and Goals of Care: Barbara Flowers is known to the palliative medicine service as she was seen in consultation in a previous hospitalization.  She is 69 years old.  She lives at home with spouse.  She has been admitted for septic shock from urinary tract infection deemed secondary to chronic indwelling Foley catheter.  She also likely has recurrent aspiration and chronic malnutrition, has feedings via G-tube.  She also has chronic congestive heart failure and stage IV sacral decubitus ulcer.  She also has been admitted with acute kidney injury deemed secondary to ischemic ATN as well as bladder outlet obstruction.  She is being followed by nephrology specialist.  She remains admitted to critical care service. Palliative medicine consultation for ongoing goals of care discussions.  Barbara Flowers was seen and examined earlier in the morning.  Discussed also with bedside RN.  It appeared that she was aware her name was being called but did not open her eyes or verbalize with me.  She did not appear to display any nonverbal gestures of distress or discomfort at the time of my evaluation: No wincing, no grimacing, no clenching of the jaw and no furrowing of the brow was observed.  Call placed and discussed with patient's husband Mr. Falwell.  He visited at the hospital today and was also able to discuss with kidney specialist.  He states that he feels encouraged that the patient's renal parameters appear to be getting better namely potassium serum creatinine and BUN numbers.  He stated that her mental  status was gradually getting better throughout the day.  Wishes to continue with current mode of care.  He asked that I call back again tomorrow on 2-22 as well for further discussions.  HCPOA Spouse Mr. Loomis.  SUMMARY OF RECOMMENDATIONS   Full code/full scope Continue current mode of care Discussed with patient's spouse over the phone.  Palliative will call him again in a.m. on 11-13-19.  Follow patient's hospital course and overall disease trajectory to help guide decision making. Thank you for the consult.  We will follow along. Code Status/Advance Care Planning:  Full code    Symptom Management:      Palliative Prophylaxis:   Delirium Protocol  Additional Recommendations (Limitations, Scope, Preferences):  Full Scope Treatment  Psycho-social/Spiritual:   Desire for further Chaplaincy support:yes  Additional Recommendations: Caregiving  Support/Resources  Prognosis:   Unable to determine  Discharge Planning: To Be Determined      Primary Diagnoses: Present on Admission: . Septic shock ([place]) . Pressure injury of skin . Failure to thrive in adult . Altered mental status, unspecified   I have reviewed the medical record, interviewed the patient and family, and examined the patient. The  following aspects are pertinent.  Past Medical History:  Diagnosis Date  . Anemia   . Anxiety disorder   . Blood transfusion without reported diagnosis   . Brain cancer [facility])    s/p sterotactic radio surgery  . Brain tumor ([place])   . Breast cancer ([place]) 1991   history of breast cancer status post left lumpectomy, chemotherapy, and radiation in the 1990s  . Cerebrovascular disease   . Dementia ([place])   . Gastric bypass status for obesity   . H/O bone marrow transplant ([place])   . Hypothyroidism   . IBS (irritable bowel syndrome)   . MDD (major depressive disorder)   . Memory loss   . Obesity   . Orthostatic hypotension   . Seizures ([place])   . 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 Stroke [facility]) 2005   hemorrhagic  . Subdural hematoma ([facility] [place])   . Sundowning   . TIA (transient ischemic attack) 2016  . Upper brachial plexus paralysis syndrome   . Urinary incontinence    Social History   Socioeconomic History  . Marital status: Married    Spouse name: Not on file  . Number of children: 1  . Years of education: Not on file  . Highest education level: Not on file  Occupational History  . Not on file  Tobacco Use  . Smoking status: Never Smoker  . Smokeless tobacco: Never Used  Substance and Sexual Activity  . Alcohol use: Yes    Alcohol/week: 0.0 standard drinks    Comment: socially  . Drug use: Never  . Sexual activity: Not on file  Other Topics Concern  . Not on file  Social History Narrative   Patient lives at home with her family.   Caffeine Use: Yes      Moved from [place]  To [place] 2016   Social Determinants of Health   Financial Resource Strain:   . Difficulty of Paying Living Expenses: Not on file  Food Insecurity:   . Worried About Charity fundraiser in the Last Year: Not on file  . Ran Out of Food in the Last Year: Not on file  Transportation Needs:   . Lack of Transportation (Medical): Not on file  . Lack of Transportation (Non-Medical): Not on file  Physical Activity:   . Days of Exercise per Week: Not on file  . Minutes of Exercise per Session: Not on file  Stress:   . Feeling of Stress : Not on file  Social Connections:   . Frequency of Communication with Friends and Family: Not on file  . Frequency of Social Gatherings with Friends and Family: Not on file  . Attends Religious Services: Not on file  . Active Member of Clubs or Organizations: Not on file  . Attends Archivist Meetings: Not on file  . Marital Status: Not on file   Family History  Problem Relation Age of Onset  . Hypertension Mother   . Stroke Mother   . Heart disease Father   . Hypertension Father    Scheduled Meds: . Chlorhexidine  Gluconate Cloth  6 each Topical Daily  . feeding supplement (PRO-STAT SUGAR FREE 64)  30 mL Per Tube BID  . feeding supplement (VITAL HIGH PROTEIN)  1,000 mL Per Tube Q24H  . heparin  5,000 Units Subcutaneous Q8H  . hydrocortisone sod succinate (SOLU-CORTEF) inj  50 mg Intravenous Q6H  . mouth rinse  15 mL Mouth Rinse BID  . midodrine  10 mg  Per Tube TID WC  . mupirocin ointment   Nasal BID  . pantoprazole sodium  40 mg Per Tube Daily  . vancomycin variable dose per unstable renal function (pharmacist dosing)   Does not apply See admin instructions   Continuous Infusions: . sodium chloride 10 mL/hr at 11/12/19 0800  . amiodarone 30 mg/hr (11/12/19 1345)  . ceFEPime (MAXIPIME) IV Stopped (11/12/19 1154)  . lactated ringers 100 mL/hr at 11/12/19 0800  . metronidazole Stopped (11/12/19 1444)  . norepinephrine (LEVOPHED) Adult infusion 5 mcg/min (11/12/19 1417)   PRN Meds:.acetaminophen Medications Prior to Admission:  Prior to Admission medications   Medication Sig Start Date End Date Taking? Authorizing Provider  acetaminophen (TYLENOL) 160 MG/5ML solution Place 20.3 mLs (650 mg total) into feeding tube every 4 (four) hours as needed for fever. 10/21/19  Yes Ghimire, Dante Gang, MD  ALPRAZolam Duanne Moron) 1 MG tablet Take 1 mg by mouth at bedtime as needed for anxiety.   Yes [provider]  Amino Acids-Protein Hydrolys (FEEDING SUPPLEMENT, PRO-STAT SUGAR FREE 64,) LIQD Place 30 mLs into feeding tube daily. 10/22/19  Yes [name], MD  Ascorbic Acid (VITAMIN C) 1000 MG tablet Take 1,000 mg by mouth daily.   Yes [provider]  aspirin EC 81 MG tablet Take 81 mg by mouth daily.   Yes [provider]  atorvastatin (LIPITOR) 20 MG tablet Take 20 mg by mouth at bedtime. 09/05/19  Yes [provider]  Cholecalciferol (VITAMIN D3) 50 MCG (2000 UT) TABS Take 2,000 Units by mouth daily.   Yes [provider]  collagenase (SANTYL) ointment Apply topically  daily. 10/22/19  Yes [name], MD  donepezil (ARICEPT) 10 MG tablet Take 10 mg by mouth at bedtime.   Yes [provider]  ferrous sulfate 325 (65 FE) MG EC tablet Take 325 mg by mouth daily with breakfast.   Yes [provider]  memantine (NAMENDA) 10 MG tablet Take 10 mg by mouth 2 (two) times daily.   Yes [provider]  midodrine (PROAMATINE) 10 MG tablet Take 10 mg by mouth 3 (three) times daily.    Yes [provider]  Multiple Vitamins-Minerals (MULTIVITAMIN WITH MINERALS) tablet Take 1 tablet by mouth daily.   Yes [provider]  Nutritional Supplements (FEEDING SUPPLEMENT, OSMOLITE 1.5 CAL,) LIQD Place 1,000 mLs into feeding tube continuous. 10/21/19  Yes [name], MD  pantoprazole (PROTONIX) 40 MG tablet Take 40 mg by mouth daily. 08/29/19  Yes [provider]  Potassium 99 MG TABS Take 99 mg by mouth daily.   Yes [provider]  QUEtiapine (SEROQUEL) 100 MG tablet Take 100 mg by mouth at bedtime.   Yes [provider]  Water For Irrigation, Sterile (FREE WATER) SOLN Place 100 mLs into feeding tube every 4 (four) hours. 10/21/19  Yes [name], MD   Allergies  Allergen Reactions  . Gabapentin Other (See Comments)    seizure  . Tramadol Other (See Comments)    Brings on Seizures    Review of Systems Non verbal with me this morning.   Physical Exam Resting in bed Does not open eyes, does not verbalize, does not follow commands S1-S2 tachycardic Regular breath sounds Appears weak and chronically ill has some contractures Discussed with bedside nursing staff about patient's skin breakdown No edema  Vital Signs: BP (!) 114/55   Pulse (!) 59   Temp 98 F (36.7 C)   Resp 16   Ht 5\' 8"  (1.727 m)  Wt 56.8 kg   SpO2 100%   BMI 19.04 kg/m  Pain Scale: PAINAD   Pain Score: 3    SpO2: SpO2: 100 % O2 Device:SpO2: 100 % O2 Flow Rate: .   IO: Intake/output summary:   Intake/Output  Summary (Last 24 hours) at 11/12/2019 1531 Last data filed at 11/12/2019 0800 Gross per 24 hour  Intake 3346.31 ml  Output 1025 ml  Net 2321.31 ml    LBM: Last BM Date: (PTA) Baseline Weight: Weight: 57.5 kg Most recent weight: Weight: 56.8 kg     Palliative Assessment/Data:   PPS 30%  Time In:  10 Time Out:  11 Time Total:  60 Greater than 50%  of this time was spent counseling and coordinating care related to the above assessment and plan.  Signed by: Loistine Chance, MD   Please contact Palliative Medicine Team phone at [phone] for questions and concerns.  For individual provider: See Shea Evans

## 2019-11-12 NOTE — Progress Notes (Signed)
Brief Nutrition Note  Consult received for enteral/tube feeding initiation and management.  Adult Enteral Nutrition Protocol initiated. Full assessment to follow.  Admitting Dx: Hyperkalemia [E87.5] Urinary retention [R33.9] AKI (acute kidney injury) ([place]) [N17.9] Septic shock ([place]) [A41.9, R65.21] Subtherapeutic anticoagulation [Z51.81, Z79.01] Sepsis with acute renal failure without septic shock, due to unspecified organism, unspecified acute renal failure type ([place]) [A41.9, R65.20, N17.9]  Body mass index is 19.04 kg/m.   Labs:  Recent Labs  Lab 11/10/2019 1740 11/04/2019 1740 11/06/2019 1800 11/14/2019 2231 11/12/19 0245 11/12/19 0621 11/12/19 0825  NA 140  --  141  --  141  --   --   K 6.1*   < > 5.2*   < > 5.2* 4.8 4.8  CL 104  --  103  --  106  --   --   CO2 23  --  25  --  22  --   --   BUN 155*  --  142*  --  136*  --   --   CREATININE 3.54*  --  3.34*  --  2.71*  --   --   CALCIUM 7.9*  --  8.7*  --  9.0  --   --   MG  --   --  2.2  --  2.2  --   --   PHOS  --   --  3.2  --  3.4  --   --   GLUCOSE 90  --  129*  --  74  --   --    < > = values in this interval not displayed.   Kerman Passey MS, RDN, LDN, CNSC RD Pager Number and Weekend/On-Call After Hours Pager Located in [place]

## 2019-11-12 NOTE — Progress Notes (Signed)
  Amiodarone Drug - Drug Interaction Consult Note  Recommendations: No interacting medications; no additional action needed at this time. Pharmacy will follow peripherally for changes  Amiodarone is metabolized by the cytochrome P450 system and therefore has the potential to cause many drug interactions. Amiodarone has an average plasma half-life of 50 days (range 20 to 100 days).   There is potential for drug interactions to occur several weeks or months after stopping treatment and the onset of drug interactions may be slow after initiating amiodarone.   []  Statins: Increased risk of myopathy. Simvastatin- restrict dose to 20mg  daily. Other statins: counsel patients to report any muscle pain or weakness immediately.  []  Anticoagulants: Amiodarone can increase anticoagulant effect. Consider warfarin dose reduction. Patients should be monitored closely and the dose of anticoagulant altered accordingly, remembering that amiodarone levels take several weeks to stabilize.  []  Antiepileptics: Amiodarone can increase plasma concentration of phenytoin, the dose should be reduced. Note that small changes in phenytoin dose can result in large changes in levels. Monitor patient and counsel on signs of toxicity.  []  Beta blockers: increased risk of bradycardia, AV block and myocardial depression. Sotalol - avoid concomitant use.  []   Calcium channel blockers (diltiazem and verapamil): increased risk of bradycardia, AV block and myocardial depression.  []   Cyclosporine: Amiodarone increases levels of cyclosporine. Reduced dose of cyclosporine is recommended.  []  Digoxin dose should be halved when amiodarone is started.  []  Diuretics: increased risk of cardiotoxicity if hypokalemia occurs.  []  Oral hypoglycemic agents (glyburide, glipizide, glimepiride): increased risk of hypoglycemia. Patient's glucose levels should be monitored closely when initiating amiodarone therapy.   []  Drugs that prolong the  QT interval:  Torsades de pointes risk may be increased with concurrent use - avoid if possible.  Monitor QTc, also keep magnesium/potassium WNL if concurrent therapy can't be avoided. Marland Kitchen Antibiotics: e.g. fluoroquinolones, erythromycin. . Antiarrhythmics: e.g. quinidine, procainamide, disopyramide, sotalol. . Antipsychotics: e.g. phenothiazines, haloperidol.  . Lithium, tricyclic antidepressants, and methadone.  Thank You,   Halea Lieb A  11/12/2019 6:42 AM

## 2019-11-13 ENCOUNTER — Inpatient Hospital Stay (HOSPITAL_COMMUNITY): Payer: Medicare Other

## 2019-11-13 DIAGNOSIS — J9601 Acute respiratory failure with hypoxia: Secondary | ICD-10-CM

## 2019-11-13 LAB — BASIC METABOLIC PANEL
Anion gap: 12 (ref 5–15)
Anion gap: 11 (ref 5–15)
BUN: 113 mg/dL — ABNORMAL HIGH (ref 8–23)
BUN: 111 mg/dL — ABNORMAL HIGH (ref 8–23)
CO2: 23 mmol/L (ref 22–32)
CO2: 22 mmol/L (ref 22–32)
Calcium: 8.2 mg/dL — ABNORMAL LOW (ref 8.9–10.3)
Calcium: 7.6 mg/dL — ABNORMAL LOW (ref 8.9–10.3)
Chloride: 111 mmol/L (ref 98–111)
Chloride: 113 mmol/L — ABNORMAL HIGH (ref 98–111)
Creatinine, Ser: 1.66 mg/dL — ABNORMAL HIGH (ref 0.44–1.00)
Creatinine, Ser: 1.3 mg/dL — ABNORMAL HIGH (ref 0.44–1.00)
GFR calc Af Amer: 36 mL/min — ABNORMAL LOW (ref >60)
GFR calc Af Amer: 48 mL/min — ABNORMAL LOW (ref >60)
GFR calc non Af Amer: 31 mL/min — ABNORMAL LOW (ref >60)
GFR calc non Af Amer: 42 mL/min — ABNORMAL LOW (ref >60)
Glucose, Bld: 142 mg/dL — ABNORMAL HIGH (ref 70–99)
Glucose, Bld: 136 mg/dL — ABNORMAL HIGH (ref 70–99)
Potassium: 3.1 mmol/L — ABNORMAL LOW (ref 3.5–5.1)
Potassium: 3 mmol/L — ABNORMAL LOW (ref 3.5–5.1)
Sodium: 146 mmol/L — ABNORMAL HIGH (ref 135–145)
Sodium: 146 mmol/L — ABNORMAL HIGH (ref 135–145)

## 2019-11-13 LAB — LACTIC ACID, PLASMA
Lactic Acid, Venous: 2.7 mmol/L (ref 0.5–1.9)
Lactic Acid, Venous: 1.9 mmol/L (ref 0.5–1.9)

## 2019-11-13 LAB — GLUCOSE, CAPILLARY
Glucose-Capillary: 116 mg/dL — ABNORMAL HIGH (ref 70–99)
Glucose-Capillary: 121 mg/dL — ABNORMAL HIGH (ref 70–99)
Glucose-Capillary: 124 mg/dL — ABNORMAL HIGH (ref 70–99)
Glucose-Capillary: 125 mg/dL — ABNORMAL HIGH (ref 70–99)
Glucose-Capillary: 140 mg/dL — ABNORMAL HIGH (ref 70–99)
Glucose-Capillary: 155 mg/dL — ABNORMAL HIGH (ref 70–99)

## 2019-11-13 LAB — PHOSPHORUS
Phosphorus: 4.3 mg/dL (ref 2.5–4.6)
Phosphorus: 3.7 mg/dL (ref 2.5–4.6)

## 2019-11-13 LAB — CBC
HCT: 25.2 % — ABNORMAL LOW (ref 36.0–46.0)
Hemoglobin: 8 g/dL — ABNORMAL LOW (ref 12.0–15.0)
MCH: 28.1 pg (ref 26.0–34.0)
MCHC: 31.7 g/dL (ref 30.0–36.0)
MCV: 88.4 fL (ref 80.0–100.0)
Platelets: 201 10*3/uL (ref 150–400)
RBC: 2.85 MIL/uL — ABNORMAL LOW (ref 3.87–5.11)
RDW: 16.2 % — ABNORMAL HIGH (ref 11.5–15.5)
WBC: 27.6 10*3/uL — ABNORMAL HIGH (ref 4.0–10.5)
nRBC: 0 % (ref 0.0–0.2)

## 2019-11-13 LAB — MAGNESIUM
Magnesium: 1.7 mg/dL (ref 1.7–2.4)
Magnesium: 2.2 mg/dL (ref 1.7–2.4)

## 2019-11-13 LAB — POTASSIUM: Potassium: 3.5 mmol/L (ref 3.5–5.1)

## 2019-11-13 LAB — VANCOMYCIN, RANDOM: Vancomycin Rm: 10

## 2019-11-13 MED ORDER — FREE WATER
150.0000 mL | Status: DC
  Administered 2019-11-13 – 2019-11-18 (×29): 150 mL

## 2019-11-13 MED ORDER — HYDROCODONE-ACETAMINOPHEN 7.5-325 MG PO TABS
1.0000 | ORAL_TABLET | Freq: Four times a day (QID) | ORAL | Status: DC | PRN
  Administered 2019-11-14 – 2019-11-15 (×2): 1
  Filled 2019-11-13 (×2): qty 1

## 2019-11-13 MED ORDER — SODIUM CHLORIDE 0.45 % IV SOLN: INTRAVENOUS | Status: DC

## 2019-11-13 MED ORDER — OSMOLITE 1.5 CAL PO LIQD
1000.0000 mL | ORAL | Status: DC
  Administered 2019-11-13 – 2019-11-17 (×2): 1000 mL
  Filled 2019-11-13 (×9): qty 1000

## 2019-11-13 MED ORDER — MAGNESIUM SULFATE 2 GM/50ML IV SOLN
2.0000 g | Freq: Once | INTRAVENOUS | Status: AC
  Administered 2019-11-13: 2 g via INTRAVENOUS
  Filled 2019-11-13: qty 50

## 2019-11-13 MED ORDER — ORAL CARE MOUTH RINSE
15.0000 mL | OROMUCOSAL | Status: DC
  Administered 2019-11-14 – 2019-11-15 (×11): 15 mL via OROMUCOSAL

## 2019-11-13 MED ORDER — NITROGLYCERIN 2 % TD OINT
1.0000 [in_us] | TOPICAL_OINTMENT | Freq: Three times a day (TID) | TRANSDERMAL | Status: AC
  Administered 2019-11-13 – 2019-11-14 (×6): 1 [in_us] via TOPICAL
  Filled 2019-11-13: qty 30

## 2019-11-13 MED ORDER — ACETAMINOPHEN 160 MG/5ML PO SOLN: 650.0000 mg | Freq: Four times a day (QID) | ORAL | Status: DC | PRN

## 2019-11-13 MED ORDER — SODIUM CHLORIDE 0.9% FLUSH
10.0000 mL | Freq: Two times a day (BID) | INTRAVENOUS | Status: DC
  Administered 2019-11-13 – 2019-11-16 (×7): 10 mL
  Administered 2019-11-17: 20 mL
  Administered 2019-11-17 – 2019-11-19 (×4): 10 mL
  Administered 2019-11-19 – 2019-11-20 (×2): 40 mL
  Administered 2019-11-20 – 2019-11-21 (×2): 10 mL
  Administered 2019-11-23: 09:00:00 40 mL
  Administered 2019-11-23 – 2019-12-02 (×15): 10 mL

## 2019-11-13 MED ORDER — SODIUM CHLORIDE 0.9% FLUSH: 10.0000 mL | INTRAVENOUS | Status: DC | PRN

## 2019-11-13 MED ORDER — CHLORHEXIDINE GLUCONATE 0.12% ORAL RINSE (MEDLINE KIT)
15.0000 mL | Freq: Two times a day (BID) | OROMUCOSAL | Status: DC
  Administered 2019-11-14 – 2019-11-16 (×7): 15 mL via OROMUCOSAL

## 2019-11-13 MED ORDER — LIP MEDEX EX OINT
TOPICAL_OINTMENT | CUTANEOUS | Status: DC | PRN
  Administered 2019-11-25 – 2019-11-26 (×2): 1 via TOPICAL
  Filled 2019-11-13 (×2): qty 7

## 2019-11-13 MED ORDER — ACETAMINOPHEN 160 MG/5ML PO SOLN
650.0000 mg | Freq: Four times a day (QID) | ORAL | Status: DC | PRN
  Administered 2019-11-13 – 2019-11-26 (×5): 650 mg
  Filled 2019-11-13 (×5): qty 20.3

## 2019-11-13 MED ORDER — PHENTOLAMINE MESYLATE 5 MG IJ SOLR
5.0000 mg | Freq: Once | INTRAMUSCULAR | Status: AC
  Administered 2019-11-13: 5 mg via SUBCUTANEOUS
  Filled 2019-11-13: qty 5

## 2019-11-13 MED ORDER — VANCOMYCIN HCL 1250 MG/250ML IV SOLN
1250.0000 mg | INTRAVENOUS | Status: AC
  Administered 2019-11-13: 1250 mg via INTRAVENOUS
  Filled 2019-11-13: qty 250

## 2019-11-13 MED ORDER — POTASSIUM CHLORIDE 20 MEQ/15ML (10%) PO SOLN
40.0000 meq | Freq: Once | ORAL | Status: AC
  Administered 2019-11-13: 10:00:00 40 meq
  Filled 2019-11-13: qty 30

## 2019-11-13 MED ORDER — FENTANYL CITRATE (PF) 100 MCG/2ML IJ SOLN
25.0000 ug | INTRAMUSCULAR | Status: DC | PRN
  Administered 2019-11-13 – 2019-11-19 (×8): 50 ug via INTRAVENOUS
  Administered 2019-11-19: 25 ug via INTRAVENOUS
  Administered 2019-11-20 – 2019-11-26 (×11): 50 ug via INTRAVENOUS
  Filled 2019-11-13 (×22): qty 2

## 2019-11-13 MED ORDER — SODIUM CHLORIDE 0.9 % IV SOLN
2.0000 g | Freq: Two times a day (BID) | INTRAVENOUS | Status: DC
  Administered 2019-11-13 – 2019-11-16 (×7): 2 g via INTRAVENOUS
  Filled 2019-11-13 (×7): qty 2

## 2019-11-13 NOTE — Progress Notes (Signed)
[facility] Progress Note Patient Name: Hadie Bitney DOB: 15-Feb-1951 MRN: [id number]   Date of Service  11/13/2019  HPI/Events of Note  Pain - Patient is NPO and on Norco PO.   eICU Interventions  Will order: 1. Fentanyl 25-50 mcg IV Q 4 hours PRN pain.      Intervention Category Major Interventions: Other:  Lysle Dingwall 11/13/2019, 7:56 PM

## 2019-11-13 NOTE — Consult Note (Signed)
[facility] Nurse Consult Note: Patient is known to our department from previous admissions.  Last seen by this writer approximately 3 weeks ago on 10/18/19. See that note for additional information. Bilateral forearms today with IV infiltrations and ecchymosis from lab draws.  Reason for Consult: Pressure injuries Wound type:Pressure Stage 4 pressure injury to sacrum:  10cm x 8cm x 1cm with undermining at 9 o'clock measuring 1cm. Dry red wound bed, 20 % nonviable tissue, 80% red. Scant serosanguinous on old dressing. Left IT: Unstageable Pressure Injury measuring 3cm x 2.5cm with 100% nonviable wound bed. None. Left lateral foot with 1.5cm round Unstageable Pressure Injury with wound bed obscured by the presence of stable eschar. Light yellow exudate consistent with autolytically debriding nonviable tissue. Right medial heel with Stage 2 pressure injury measuring 1cm x 2.5cm x 0.1cm with red, dry wound bed. No exudate. Right ear deep tissue pressure injury (DTPI): The right ear presents with a 3cm x 0.5cm area of deep purple/maroon discoloration with a black center. I educate her husband today about the seriousness of such an injury in that with no subcutaneous fat, the cartilage of the ear could be easily exposed unless immediate and consistent pressure redistribution of pressure is employed.   Right hip with linear deep red skin discoloration measuring 4cm x 0.5cm with no depth. This could be a device related pressure injury, but it does not mirror a medical device.    Pressure Injury POA: Yes Measurement: As noted above Wound bed:As noted above Drainage (amount, consistency, odor) As noted above Periwound: intact, dry, edematous Dressing procedure/placement/frequency: I will implement a POC directed to moisture retentive wound dressing and for ease of care: the sacral wound defect will be filled with saline moistened roll gauze, toped with dry gauze and covered with a silicone foam dressing.  The left  IT ulcer will be dressed with a saline moistened gauze dressing, topped with a dry gauze dressing and secured with a silicone foam. The right medial malleolus will be protected with a silicone dressing and the left lateral foot will will be placed into Prevalon pressure redistribution heel boots for protection and heel floatation.  The patient's husband is eager to discuss with me that his priority is to get his wife's sacrum healed.  I explain to him that there are many obstacles to healing this wound and he nods in agreement. Patient calls out in pain with side to side repositioning and wound assessment. Husband also mentions that his wife needs an air mattress at home and that he has not had one in the past. Patient requires a mattress replacement with low air loss feature because she has no turning surfaces that are unaffected by pressure.   [facility] nursing team will not follow routinely, but will remain available to this patient, the nursing and medical teams.  Please re-consult if needed. Thanks, Maudie Flakes, MSN, RN, [place], Arther Abbott  Pager# [phone]

## 2019-11-13 NOTE — Progress Notes (Addendum)
NAME:  Barbara Flowers, MRN:  [id number], DOB:  08/30/1951, LOS: 2 ADMISSION DATE:  11/08/2019, CONSULTATION DATE:  11/16/2019 REFERRING MD: Gardenia Phlegm MD, CHIEF COMPLAINT: Septic shock  Brief History   69 year old with complicated history as noted below with recent admission from 12/26 to 10/21/2019 for respiratory failure, MRSA pneumonia, CHF.  Returns to hospital with low urine output, altered mental status. She was started on levaquin as an outpatient for UTI but continued to worsen. In ED she has septic shock from indwelling blocked Foley (which was replaced), urosepsis with AKI, hyperkalemia.    Past Medical History  Breast cancer status postmastectomy, brain aneurysm, dementia, CVA with left hemiplegia, cardiac contractures, stage IV sacral decub, Shy-Drager syndrome, LV thrombus, paroxysmal atrial fibrillation/flutter  Significant Hospital Events   2/20- Admit amount peripheral levo 2/21-rapid A. fib-given amnio bolus  Consults:  PCCM, Palliative  Procedures:    Significant Diagnostic Tests:  Renal ultrasound 2/20-no hydronephrosis Chest x-ray 2/20-possible small pneumothorax. Chest x-ray 2/20-no lung infiltrate or acute findings.  No pneumothorax noted  Micro Data:  Blood cultures 2/20- Urine Cultures 2/20- MRSA PCR 2/20 > Positive   Antimicrobials:  Flagyl 2/21 >> Vanco 2/21 >> Cefepime 2/21 >>  Interim history/subjective:  Overnight with infiltration of PIV. Remains on low dose levophed.   Scheduled Meds: . Chlorhexidine Gluconate Cloth  6 each Topical Daily  . feeding supplement (PRO-STAT SUGAR FREE 64)  30 mL Per Tube BID  . feeding supplement (VITAL HIGH PROTEIN)  1,000 mL Per Tube Q24H  . free water  150 mL Per Tube Q4H  . heparin  5,000 Units Subcutaneous Q8H  . hydrocortisone sod succinate (SOLU-CORTEF) inj  50 mg Intravenous Q6H  . mouth rinse  15 mL Mouth Rinse BID  . midodrine  10 mg Per Tube TID WC  . mupirocin ointment   Nasal BID  .  nitroGLYCERIN  1 inch Topical Q8H  . pantoprazole sodium  40 mg Per Tube Daily  . vancomycin variable dose per unstable renal function (pharmacist dosing)   Does not apply See admin instructions   Continuous Infusions: . sodium chloride    . sodium chloride 250 mL (11/13/19 0941)  . ceFEPime (MAXIPIME) IV 2 g (11/13/19 0948)  . magnesium sulfate bolus IVPB    . metronidazole Stopped (11/13/19 0746)  . norepinephrine (LEVOPHED) Adult infusion 3 mcg/min (11/13/19 0532)  . vancomycin     PRN Meds:.acetaminophen   Objective   Blood pressure (!) 99/55, pulse 66, temperature 97.6 F (36.4 C), temperature source Oral, resp. rate 11, height 5\' 8"  (1.727 m), weight 57.6 kg, SpO2 100 %.        Intake/Output Summary (Last 24 hours) at 11/13/2019 0901 Last data filed at 11/13/2019 0600 Gross per 24 hour  Intake 2511.42 ml  Output 2175 ml  Net 336.42 ml   Filed Weights   11/01/2019 2100 11/12/19 0500 11/13/19 0500  Weight: 56.7 kg 56.8 kg 57.6 kg    Examination: Gen:      Chronically ill-appearing, frail elderly female  HEENT:  MMM Lungs:    Clear breath sounds, no crackles/wheeze  CV:         RRR, no MRG Abd:      PEG tube, soft nontender, nondistended Ext:    -edema  Skin:      Warm and dry; no rash Neuro:    Alert, non-verbal    Labs and Imaging Reviewed this AM.   Resolved Hospital Problem list  Assessment & Plan:   Septic shock secondary to UTI from indwelling Foley's and Question Component of Infection secondary to sacral wound  Plan  -Continue gentle IV fluids -Levophed via peripheral IV, titrate for MAP goal >65  -Started on stress dose steroids 2/20 -Repeat LA at 1200 -Continue vancomycin, cefepime, Flagyl   AKI, hyperkalemia secondary to septic shock and urinary retention > improving Hypernatremia  Plan  -Nephrology Following  -Trend BMP  -Add Free Water 150 q4h >> Repeat BMP 1600   Chronic hypotension, Orthostatic Hypotension, Autonomic Dysfunction    Plan  -Midodrine per tube  Chronic CHF -EF 45-50  Paroxysmal atrial fibrillation with RVR on 2/21  Plan -Cardiac Monitoring  -Amiodarone D/C 2/21, remains in NRS with HR 60s   Recurrent aspiration, chronic protein energy malnutrition Plan -Feeding tubes via PEG tube -Nutrition consult  Sacral decub stage IV present on admission  Plan  -Wound care consult  Goals of care Discussed with husband at bedside and son who is a neurosurgery resident over telephone on 2/21 via Dr. Vaughan Browner >> Patient previously had palliative involved with goals of care discussion. At present they are okay with ICU admission, pressors, central line if needed. They are not sure about intubation or CPR and want to discuss further about it Palliative care Following   Best practice:  Diet: Tube feed DVT prophylaxis: Heparin SQ GI prophylaxis: PPI Glucose control: [place] Mobility: Bed Code Status: Full Family Communication: As above. Husband last updated 2/21 Disposition: ICU  Critical care time:   The patient is critically ill with multiple organ system failure and requires high complexity decision making for assessment and support, frequent evaluation and titration of therapies, advanced monitoring, review of radiographic studies and interpretation of complex data.   Critical Care Time devoted to patient care services, exclusive of separately billable procedures, described in this note is 35  minutes.   Hayden Pedro, AGACNP-BC [facility]  Pgr: [phone]  PCCM Pgr: [phone]

## 2019-11-13 NOTE — Progress Notes (Signed)
Pharmacy Antibiotic Note  Barbara Flowers is a 69 y.o. female with hx CVA, UTI (on LVQ PTA), sacral decubitus ulcer, and bladder outlet obstruction with indwelling cath presented to the ED on 11/06/2019 with AMS and urinary retention.  Foley cath changed in the ED on 2/20.  She was started on vancomycin, cefepime, and flagyl on admission for sepsis.  Today, 11/13/2019: -  Afeb, WBC elevated but down - scr elevated but trending down 1.66 (crcl~29) - vancomycin random level this morning at 0531 = 10 (~38 hrs after last dose) - all cultures have been negative thus far  Plan: - vancomycin 1250 mg IV x1 now - with renal function improving, will change cefepime to 2gm IV q12h - continue flagyl 500 mg IV q8h - monitor renal function closely - f/u cx results _____________________________________  Height: 5\' 8"  (172.7 cm) Weight: 126 lb 15.8 oz (57.6 kg) IBW/kg (Calculated) : 63.9  Temp (24hrs), Avg:97.4 F (36.3 C), Min:96.7 F (35.9 C), Max:98 F (36.7 C)  Recent Labs  Lab 11/08/2019 1151 11/10/2019 1153 11/07/2019 1430 11/06/2019 1740 11/04/2019 1800 11/02/2019 2231 11/12/19 0245 11/13/19 0531 11/13/19 0609  WBC 36.1*  --   --   --  28.0*  --  33.7*  --  27.6*  CREATININE 4.27*  --   --  3.54* 3.34*  --  2.71* 1.66*  --   LATICACIDVEN  --    < > 2.8* 2.1* 2.8* 3.6*  --  2.7*  --   VANCORANDOM  --   --   --   --   --   --   --  10  --    < > = values in this interval not displayed.    Estimated Creatinine Clearance: 29.1 mL/min (A) (by C-G formula based on SCr of 1.66 mg/dL (H)).    Allergies  Allergen Reactions  . Gabapentin Other (See Comments)    seizure  . Tramadol Other (See Comments)    Brings on Seizures     Antimicrobials this admission: 2/20 vanc >> 2/20 cefepime >> 2/20 flagyl >>   Dose adjustments this admission: 2/22 at 0531 VR= 10 (38 hrs after 1250 mg dose)   Microbiology results: Previous Cx:  09/27/19 sputum: MRSA 09/16/19 blood: proteus, CNS  2/20 BCx x2:   2/20 UCx Cath (PTA foley): 2/20 UCx I/O Cath: insignif growth FINAL 2/20 MRSA PCR: positive  Thank you for allowing pharmacy to be a part of this patient's care.  Lynelle Doctor 11/13/2019 8:12 AM

## 2019-11-13 NOTE — Progress Notes (Signed)
Initial Nutrition Assessment   RD working remotely.  DOCUMENTATION CODES:   Not applicable  INTERVENTION:  Discontinue Vital High Protein.  Initiate Osmolite 1.5 formula @ 20 ml/hr via PEG and increase by 10 ml every 4 hours to goal rate of 50 ml/hr.   Continue 30 ml Prostat BID per tube.    Continue free water flushes of 150 ml every 4 hours per tube. (MD to adjust as appropriate)  Tube feeding regimen provides 2000 kcal (100% of needs), 105 grams of protein, and 1812 ml of H2O.   NUTRITION DIAGNOSIS:   Inadequate oral intake related to inability to eat as evidenced by NPO status.  GOAL:   Patient will meet greater than or equal to 90% of their needs  MONITOR:   TF tolerance, Skin, Weight trends, Labs, I & O's  REASON FOR ASSESSMENT:   Consult Enteral/tube feeding initiation and management  ASSESSMENT:   69 year old with history of Breast cancer status postmastectomy (1990's), brain aneurysm, dementia, CVA with left hemiplegia, cardiac contractures, stage IV sacral decub, Shy-Drager syndrome, LV thrombus, atrial fibrillation/flutter, recurrent aspiration with PEG, with recent admission from 12/26 to 10/21/2019 for respiratory failure, MRSA pneumonia, CHF. Presents with septic shock from indwelling Foley (which was replaced), urosepsis with AKI, hyperkalemia.  Pt NPO on tube feeds via PEG. Weight stable per weight records. Pt currently has Vital High Prostat infusing at 40 ml/hr with 30 ml Prostat BID which provides 1160 kcal (58% of kcal needs). RD to modify tube feeding orders to meet full nutrition needs. RD to continue to monitor for tolerance.   Unable to complete Nutrition-Focused physical exam at this time.   Labs and medications reviewed.   Diet Order:   Diet Order            Diet NPO time specified  Diet effective now              EDUCATION NEEDS:   Not appropriate for education at this time  Skin:  Skin Assessment: Skin Integrity Issues: Skin  Integrity Issues:: Unstageable, Stage IV, Stage II Stage II: L thigh, R ear Stage IV: sacrum Unstageable: L foot  Last BM:  Unknown  Height:   Ht Readings from Last 1 Encounters:  10/24/2019 5\' 8"  (1.727 m)    Weight:   Wt Readings from Last 1 Encounters:  11/13/19 57.6 kg   BMI:  Body mass index is 19.31 kg/m.  Estimated Nutritional Needs:   Kcal:  2000-2200  Protein:  110-115 grams  Fluid:  >/= 2 L/day    Barbara Parker, MS, RD, LDN RD pager number/after hours weekend pager number on Amion.

## 2019-11-13 NOTE — Progress Notes (Signed)
Daily Progress Note   Patient Name: Teniah Agner       Date: 11/13/2019 DOB: 11-Oct-1950  Age: 69 y.o. MRN#: [id number] Attending Physician: Marshell Garfinkel, MD Primary Care Physician: [name], NP Admit Date: 11/05/2019  Reason for Consultation/Follow-up: Establishing goals of care  Subjective:  somewhat more awake, alert. Undergoing mid line IV placement. Husband at bedside. See below.    Length of Stay: 2  Current Medications: Scheduled Meds:  . Chlorhexidine Gluconate Cloth  6 each Topical Daily  . feeding supplement (PRO-STAT SUGAR FREE 64)  30 mL Per Tube BID  . free water  150 mL Per Tube Q4H  . heparin  5,000 Units Subcutaneous Q8H  . hydrocortisone sod succinate (SOLU-CORTEF) inj  50 mg Intravenous Q6H  . mouth rinse  15 mL Mouth Rinse BID  . midodrine  10 mg Per Tube TID WC  . mupirocin ointment   Nasal BID  . nitroGLYCERIN  1 inch Topical Q8H  . pantoprazole sodium  40 mg Per Tube Daily  . sodium chloride flush  10-40 mL Intracatheter Q12H  . vancomycin variable dose per unstable renal function (pharmacist dosing)   Does not apply See admin instructions    Continuous Infusions: . sodium chloride 100 mL/hr at 11/13/19 1246  . sodium chloride 250 mL (11/13/19 0941)  . ceFEPime (MAXIPIME) IV Stopped (11/13/19 1018)  . feeding supplement (OSMOLITE 1.5 CAL)    . metronidazole Stopped (11/13/19 0746)  . norepinephrine (LEVOPHED) Adult infusion 3 mcg/min (11/13/19 0532)  . vancomycin 1,250 mg (11/13/19 1247)    PRN Meds: acetaminophen, sodium chloride flush  Physical Exam         Appears weak Regular work of breathing Has PEG tube No edema S1 S2 monitor noted Eyes open, appears alert.   Vital Signs: BP (!) 134/57   Pulse (!) 53   Temp 97.6 F (36.4 C)  (Oral)   Resp 17   Ht 5\' 8"  (1.727 m)   Wt 57.6 kg   SpO2 100%   BMI 19.31 kg/m  SpO2: SpO2: 100 % O2 Device: O2 Device: Room Air O2 Flow Rate:    Intake/output summary:   Intake/Output Summary (Last 24 hours) at 11/13/2019 1321 Last data filed at 11/13/2019 0600 Gross per 24 hour  Intake 2511.42 ml  Output 2175 ml  Net 336.42 ml   LBM: Last BM Date: (PTA) Baseline Weight: Weight: 57.5 kg Most recent weight: Weight: 57.6 kg       Palliative Assessment/Data:      Patient Active Problem List   Diagnosis Date Noted  . Septic shock ([place]) 11/09/2019  . Left spastic hemiparesis with h/o strokes 10/19/2019  . Malnutrition of moderate degree 10/19/2019  . Decreased functional mobility 10/17/2019  . Sacral decubitus ulcer, stage IV ([place]) 10/17/2019  . 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 Dysphagia 10/17/2019  . Kyphoscoliosis 10/17/2019  . H/O bone marrow transplant ([place])   . Palliative care by specialist   . Goals of care, counseling/discussion   . Generalized weakness   . Failure to thrive in adult   . Acute hypoxemic respiratory failure ([place])   . Pleural effusion   . Acute systolic heart failure ([place])   . Protein-calorie malnutrition, severe ([place]) 09/24/2019  . Pressure injury of skin 09/17/2019  . CAP (community acquired pneumonia) 09/16/2019  . Pleural effusion on left 09/16/2019  . Acute prerenal azotemia 09/16/2019  . Hypernatremia 09/16/2019  . Altered mental status, unspecified 07/12/2017  . Diarrhea 05/24/2017  . Avascular necrosis of bone of hip, right ([place]) 11/23/2016  . Memory difficulty 11/23/2016  . Hallucination, visual 11/23/2016  . Cervical myelopathy ([place]) 06/13/2016  . Hypokalemia 06/12/2016  . 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[place]) 06/12/2016  . Parkinson's disease ([place]) 06/11/2016  . GI bleed 06/09/2016  . Chronic right hip pain 01/10/2016  . Physical debility 01/10/2016  . Wheelchair dependent 2017- 10/17/2015  . TIA (transient ischemic  attack) 08/31/2015  . History of CVA with residual deficit left side 08/31/2015  . Hypothyroidism 08/31/2015  . OSA on CPAP 08/31/2015  . Dementia ([place]) 08/31/2015  . Chronic orthostatic hypotension 08/31/2015  . OAB (overactive bladder) 08/31/2015  . 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  . Peripheral neuropathy 08/31/2015  . Avascular necrosis of right femoral head ([place]) 08/31/2015  . Left carotid bruit 08/31/2015  . Anemia 08/31/2015    Palliative Care Assessment & Plan   Patient Profile:    Assessment: 69 year old lady known to palliative medicine service and a previous hospitalization, admitted with septic shock secondary to urinary tract infection from indwelling Foley, also has sacral wound. Acute kidney injury hyperkalemia from septic shock as well as urinary retention Chronic hypotension autonomic dysfunction Chronic heart failure and paroxysmal atrial fibrillation Recurrent aspiration chronic at least moderate protein calorie malnutrition has feeding tube via PEG   Recommendations/Plan:  Continue current mode of care  Full code/full scope care for now  CODE STATUS and goals of care discussions undertaken with patient's husband Mr. Ransom who has now arrived at the bedside.  Briefly reviewed about the patient's past hospitalization and palliative involvement at that time as well.  Today time was spent discussing CODE STATUS: Full code versus DO NOT RESUSCITATE. Patient's husband requested that palliative contact number be emailed to him so that they can reach out to palliative service here at the hospital after he has further discussions with the patient as well as their son with regards to [place].  Offered active listening and supportive presence.    Palliative to follow.  Code Status:    Code Status Orders  (From admission, onward)         Start     Ordered   11/15/2019 1759  Full code  Continuous     11/06/2019 1759        Code Status History  Date  Active Date Inactive Code Status Order ID Comments User Context   09/16/2019 1740 10/21/2019 2017 Full Code [id number]  Tanda Rockers, MD ED   05/24/2017 2004 05/28/2017 2035 Full Code [id number]  Etta Quill, DO ED   08/31/2015 1641 09/02/2015 2016 Full Code [id number]  Samella Parr, NP Inpatient   Advance Care Planning Activity    Advance Directive Documentation     Most Recent Value  Type of Advance Directive  Healthcare Power of [place], Living will  Pre-existing out of facility DNR order (yellow form or pink MOST form)  --  "MOST" Form in Place?  --       Prognosis:   guarded   Discharge Planning:  To Be Determined  Care plan was discussed with  Inter disciplinary team, husband outside the room.   Thank you for allowing the Palliative Medicine Team to assist in the care of this patient.   Time In: 12.30 Time Out: 1305 Total Time 35 Prolonged Time Billed No       Greater than 50%  of this time was spent counseling and coordinating care related to the above assessment and plan.  Loistine Chance, MD  Please contact Palliative Medicine Team phone at [phone] for questions and concerns.

## 2019-11-13 NOTE — Progress Notes (Signed)
[facility] Progress Note Patient Name: Barbara Flowers DOB: [birth year] MRN: [id number]   Date of Service  11/13/2019  HPI/Events of Note  Notified of K 3.1, Mg 1.7 Creatinine 1.66  eICU Interventions  Ordered k 40 meqs per tube and magnesium sulfate 1 g     Intervention Category Major Interventions: Electrolyte abnormality - evaluation and management  [name] 11/13/2019, 6:58 AM

## 2019-11-13 NOTE — Progress Notes (Signed)
Pt's IV in her left AC infiltrated this morning around 0200. The IV had LR and Levophed infusing. The infusions were immediately stopped and the fluid in the space was drawn off before the catheter was removed. This RN elevated the extremity and applied heat to the area. Per [place] MD, the Adult Extravasation order set was enacted to help prevent the patient further harm. Regitine 5 mg SQ was given and  Nitroglycerin ointment was applied, the arm was then wrapped and once again elevated and heat was applied. RN will continue to monitor and report any continued adverse effects to the provider.

## 2019-11-13 NOTE — Progress Notes (Signed)
[facility] Progress Note Patient Name: Barbara Flowers DOB: Oct 08, 1950 MRN: [id number]   Date of Service  11/13/2019  HPI/Events of Note  Notified of extravasation of norepinephrine  eICU Interventions  Vasopressor extravasation management protocol to be applied     Intervention Category Intermediate Interventions: Other:  [name] 11/13/2019, 2:07 AM

## 2019-11-14 DIAGNOSIS — Z515 Encounter for palliative care: Secondary | ICD-10-CM

## 2019-11-14 DIAGNOSIS — Z7189 Other specified counseling: Secondary | ICD-10-CM

## 2019-11-14 DIAGNOSIS — R531 Weakness: Secondary | ICD-10-CM

## 2019-11-14 DIAGNOSIS — R627 Adult failure to thrive: Secondary | ICD-10-CM

## 2019-11-14 LAB — CBC
HCT: 25.8 % — ABNORMAL LOW (ref 36.0–46.0)
Hemoglobin: 7.8 g/dL — ABNORMAL LOW (ref 12.0–15.0)
MCH: 28.4 pg (ref 26.0–34.0)
MCHC: 30.2 g/dL (ref 30.0–36.0)
MCV: 93.8 fL (ref 80.0–100.0)
Platelets: 194 10*3/uL (ref 150–400)
RBC: 2.75 MIL/uL — ABNORMAL LOW (ref 3.87–5.11)
RDW: 16.7 % — ABNORMAL HIGH (ref 11.5–15.5)
WBC: 23.9 10*3/uL — ABNORMAL HIGH (ref 4.0–10.5)
nRBC: 0 % (ref 0.0–0.2)

## 2019-11-14 LAB — BASIC METABOLIC PANEL
Anion gap: 10 (ref 5–15)
BUN: 96 mg/dL — ABNORMAL HIGH (ref 8–23)
CO2: 21 mmol/L — ABNORMAL LOW (ref 22–32)
Calcium: 7.6 mg/dL — ABNORMAL LOW (ref 8.9–10.3)
Chloride: 113 mmol/L — ABNORMAL HIGH (ref 98–111)
Creatinine, Ser: 1.12 mg/dL — ABNORMAL HIGH (ref 0.44–1.00)
GFR calc Af Amer: 58 mL/min — ABNORMAL LOW (ref >60)
GFR calc non Af Amer: 50 mL/min — ABNORMAL LOW (ref >60)
Glucose, Bld: 176 mg/dL — ABNORMAL HIGH (ref 70–99)
Potassium: 3 mmol/L — ABNORMAL LOW (ref 3.5–5.1)
Sodium: 144 mmol/L (ref 135–145)

## 2019-11-14 LAB — URINE CULTURE: Culture: 40000 — AB

## 2019-11-14 LAB — GLUCOSE, CAPILLARY
Glucose-Capillary: 165 mg/dL — ABNORMAL HIGH (ref 70–99)
Glucose-Capillary: 140 mg/dL — ABNORMAL HIGH (ref 70–99)
Glucose-Capillary: 154 mg/dL — ABNORMAL HIGH (ref 70–99)
Glucose-Capillary: 161 mg/dL — ABNORMAL HIGH (ref 70–99)
Glucose-Capillary: 150 mg/dL — ABNORMAL HIGH (ref 70–99)
Glucose-Capillary: 171 mg/dL — ABNORMAL HIGH (ref 70–99)

## 2019-11-14 MED ORDER — HYDROCORTISONE NA SUCCINATE PF 100 MG IJ SOLR
50.0000 mg | Freq: Two times a day (BID) | INTRAMUSCULAR | Status: DC
  Administered 2019-11-14 – 2019-11-16 (×4): 50 mg via INTRAVENOUS
  Filled 2019-11-14 (×4): qty 2

## 2019-11-14 MED ORDER — POTASSIUM CHLORIDE 20 MEQ/15ML (10%) PO SOLN
30.0000 meq | ORAL | Status: AC
  Administered 2019-11-14 (×2): 30 meq
  Filled 2019-11-14 (×2): qty 30

## 2019-11-14 NOTE — Progress Notes (Deleted)
Cardiology Office Note:   Date:  11/14/2019  NAME:  Barbara Flowers    MRN: [id number] DOB:  [birth year]   PCP:  [name], NP  Cardiologist:  Evalina Field, MD  Electrophysiologist:  None   Referring MD: [name], NP   No chief complaint on file. ***  History of Present Illness:   Barbara Flowers is a 69 y.o. female with a hx of *** who is being seen today for the evaluation of *** at the request of ***.  Past Medical History: Past Medical History:  Diagnosis Date  . Anemia   . Anxiety disorder   . Blood transfusion without reported diagnosis   . Brain cancer [facility])    s/p sterotactic radio surgery  . Brain tumor ([place])   . Breast cancer ([place]) 1991   history of breast cancer status post left lumpectomy, chemotherapy, and radiation in the 1990s  . Cerebrovascular disease   . Dementia ([place])   . Gastric bypass status for obesity   . H/O bone marrow transplant ([place])   . Hypothyroidism   . IBS (irritable bowel syndrome)   . MDD (major depressive disorder)   . Memory loss   . Obesity   . Orthostatic hypotension   . Seizures ([place])   . 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 Stroke [facility]) 2005   hemorrhagic  . Subdural hematoma ([place])   . Sundowning   . TIA (transient ischemic attack) 2016  . Upper brachial plexus paralysis syndrome   . Urinary incontinence     Past Surgical History: Past Surgical History:  Procedure Laterality Date  . BONE MARROW TRANSPLANT  1991  . BREAST LUMPECTOMY Left 1991   post chemotherapy and radiation  . CHOLECYSTECTOMY    . CRANIOTOMY FOR HEMISPHERECTOMY TOTAL / PARTIAL Right 2010   Brain Metastasis - right temporal lobe - excised  . GASTRIC BYPASS  1985  . HAND SURGERY Left 2015  . HAND TENDON SURGERY Left    for brachial plexus injury  . INCISIONAL HERNIA REPAIR N/A 10/18/2019   Procedure: LAPAROSCOPIC INCISIONAL HERNIA REPAIR;  Surgeon: Michael Boston, MD;  Location: [facility] ORS;  Service: General;  Laterality: N/A;  . LAPAROSCOPIC  GASTROSTOMY N/A 10/18/2019   Procedure: LAPAROSCOPIC GASTROSTOMY;  Surgeon: Michael Boston, MD;  Location: [facility] ORS;  Service: General;  Laterality: N/A;  . LAPAROSCOPIC LYSIS OF ADHESIONS N/A 10/18/2019   Procedure: LAPAROSCOPIC LYSIS OF ADHESIONS;  Surgeon: Michael Boston, MD;  Location: [facility] ORS;  Service: General;  Laterality: N/A;  . LYMPH NODE BIOPSY      Current Medications: No outpatient medications have been marked as taking for the 11/17/19 encounter (Appointment) with Geralynn Rile, MD.     Allergies:    Gabapentin and Tramadol   Social History: Social History   Socioeconomic History  . Marital status: Married    Spouse name: Not on file  . Number of children: 1  . Years of education: Not on file  . Highest education level: Not on file  Occupational History  . Not on file  Tobacco Use  . Smoking status: Never Smoker  . Smokeless tobacco: Never Used  Substance and Sexual Activity  . Alcohol use: Yes    Alcohol/week: 0.0 standard drinks    Comment: socially  . Drug use: Never  . Sexual activity: Not on file  Other Topics Concern  . Not on file  Social History Narrative   Patient lives at home with her family.   Caffeine Use: Yes  Moved from [place]  To [place] 2016   Social Determinants of Health   Financial Resource Strain:   . Difficulty of Paying Living Expenses: Not on file  Food Insecurity:   . Worried About Charity fundraiser in the Last Year: Not on file  . Ran Out of Food in the Last Year: Not on file  Transportation Needs:   . Lack of Transportation (Medical): Not on file  . Lack of Transportation (Non-Medical): Not on file  Physical Activity:   . Days of Exercise per Week: Not on file  . Minutes of Exercise per Session: Not on file  Stress:   . Feeling of Stress : Not on file  Social Connections:   . Frequency of Communication with Friends and Family: Not on file  . Frequency of Social Gatherings with Friends and Family: Not on file  .  Attends Religious Services: Not on file  . Active Member of Clubs or Organizations: Not on file  . Attends Archivist Meetings: Not on file  . Marital Status: Not on file     Family History: The patient's ***family history includes Heart disease in her father; Hypertension in her father and mother; Stroke in her mother.  ROS:   All other ROS reviewed and negative. Pertinent positives noted in the HPI.     EKGs/Labs/Other Studies Reviewed:   The following studies were personally reviewed by me today:  EKG:  EKG is *** ordered today.  The ekg ordered today demonstrates ***, and was personally reviewed by me.   Recent Labs: 09/16/2019: B Natriuretic Peptide 117.3 10/20/2019: TSH 3.810 11/10/2019: ALT 43 11/13/2019: Magnesium 2.2 11/14/2019: BUN 96; Creatinine, Ser 1.12; Hemoglobin 7.8; Platelets 194; Potassium 3.0; Sodium 144   Recent Lipid Panel    Component Value Date/Time   CHOL 170 09/01/2015 0428   TRIG 82 09/01/2015 0428   HDL 50 09/01/2015 0428   CHOLHDL 3.4 09/01/2015 0428   VLDL 16 09/01/2015 0428   LDLCALC 104 (H) 09/01/2015 0428    Physical Exam:   VS:  There were no vitals taken for this visit.   Wt Readings from Last 3 Encounters:  11/14/19 127 lb 3.3 oz (57.7 kg)  10/21/19 126 lb 12.2 oz (57.5 kg)  05/24/17 150 lb 2.1 oz (68.1 kg)    General: Well nourished, well developed, in no acute distress Heart: Atraumatic, normal size  Eyes: PEERLA, EOMI  Neck: Supple, no JVD Endocrine: No thryomegaly Cardiac: Normal S1, S2; RRR; no murmurs, rubs, or gallops Lungs: Clear to auscultation bilaterally, no wheezing, rhonchi or rales  Abd: Soft, nontender, no hepatomegaly  Ext: No edema, pulses 2+ Musculoskeletal: No deformities, BUE and BLE strength normal and equal Skin: Warm and dry, no rashes   Neuro: Alert and oriented to person, place, time, and situation, CNII-XII grossly intact, no focal deficits  Psych: Normal mood and affect   ASSESSMENT:   Barbara Flowers is a 69 y.o. female who presents for the following: No diagnosis found.  PLAN:   There are no diagnoses linked to this encounter.  Disposition: No follow-ups on file.  Medication Adjustments/Labs and Tests Ordered: Current medicines are reviewed at length with the patient today.  Concerns regarding medicines are outlined above.  No orders of the defined types were placed in this encounter.  No orders of the defined types were placed in this encounter.   There are no Patient Instructions on file for this visit.   Signed, Addison Naegeli. Audie Box, MD  [facility]  [address] [place], [place] [address] [phone]  11/14/2019 6:08 PM

## 2019-11-14 NOTE — Progress Notes (Signed)
[place] Progress Note Patient Name: Barbara Flowers DOB: 04-30-1951 MRN: [id number]   Date of Service  11/14/2019  HPI/Events of Note  Diarrhea with skin breakdown. Request for Flexiseal.   eICU Interventions  Will order Flexiseal.      Intervention Category Major Interventions: Other:  Lysle Dingwall 11/14/2019, 4:58 AM

## 2019-11-14 NOTE — Progress Notes (Signed)
[facility] ADULT ICU REPLACEMENT PROTOCOL FOR AM LAB REPLACEMENT ONLY  The patient does apply for the [facility] Adult ICU Electrolyte Replacment Protocol based on the criteria listed below:   1. Is GFR >/= 40 ml/min? Yes.    Patient's GFR today is 50 2. Is urine output >/= 0.5 ml/kg/hr for the last 6 hours? Yes.   Patient's UOP is 1.0 ml/kg/hr 3. Is BUN < 60 mg/dL? Yes.    Patient's BUN today is 56 4. Abnormal electrolyte(s):k 3.0 5. Ordered repletion with: peg per protocol 6. If a panic level lab has been reported, has the CCM MD in charge been notified? No..   Physician:    Ronda Fairly A 11/14/2019 5:58 AM

## 2019-11-14 NOTE — Progress Notes (Signed)
NAME:  Barbara Flowers, MRN:  [id number], DOB:  10/27/1950, LOS: 3 ADMISSION DATE:  10/31/2019, CONSULTATION DATE:  10/28/2019 REFERRING MD: Gardenia Phlegm MD, CHIEF COMPLAINT: Septic shock  Brief History   69 year old with complicated history as noted below with recent admission from 12/26 to 10/21/2019 for respiratory failure, MRSA pneumonia, CHF.  Returns to hospital with low urine output, altered mental status. She was started on levaquin as an outpatient for UTI but continued to worsen. In ED she has septic shock from indwelling blocked Foley (which was replaced), urosepsis with AKI, hyperkalemia.    Past Medical History  Breast cancer status postmastectomy, brain aneurysm, dementia, CVA with left hemiplegia, cardiac contractures, stage IV sacral decub, Shy-Drager syndrome, LV thrombus, paroxysmal atrial fibrillation/flutter  Significant Hospital Events   2/20- Admit amount peripheral levo 2/21-rapid A. fib-given amnio bolus  Consults:  PCCM, Palliative  Procedures:    Significant Diagnostic Tests:  Renal ultrasound 2/20-no hydronephrosis   Micro Data:  Blood cultures 2/20- Urine Cultures 2/20- enterococcus  MRSA PCR 2/20 > Positive   Antimicrobials:  Flagyl 2/21 >> Vanco 2/21 >> Cefepime 2/21 >>  Interim history/subjective:  Off pressors.  Some moaning in pain per RN.  Scr slightly improved.   Objective   Blood pressure (!) 149/67, pulse (!) 59, temperature (!) 97.4 F (36.3 C), temperature source Axillary, resp. rate 15, height 5\' 8"  (1.727 m), weight 57.7 kg, SpO2 99 %.        Intake/Output Summary (Last 24 hours) at 11/14/2019 0936 Last data filed at 11/14/2019 0600 Gross per 24 hour  Intake 3035.05 ml  Output 1550 ml  Net 1485.05 ml   Filed Weights   11/12/19 0500 11/13/19 0500 11/14/19 0500  Weight: 56.8 kg 57.6 kg 57.7 kg    Examination: Gen:      Chronically ill-appearing, frail elderly female, NAD in bed  HEENT:  MMM Lungs:    resps even non labored  on RA, Clear breath sounds, no crackles/wheeze  CV:         RRR, no MRG Abd:      PEG tube, soft nontender, nondistended Ext:    -edema  Skin:      Warm and dry; no rash, sacral wound dressings c/d Neuro:    Alert, non-verbal, smiles   Labs and Imaging Reviewed this AM.   Resolved Hospital Problem list     Assessment & Plan:   Septic shock secondary to UTI from indwelling Foley's and Question Component of Infection secondary to sacral wound  Plan  -Continue gentle IV fluids -wean stress steroids  -Continue vancomycin, cefepime, Flagyl  - continue home midodrine   AKI, hyperkalemia secondary to septic shock and urinary retention > improving Hypernatremia  Plan  -Nephrology Following  -Trend BMP  -continue Free Water 150 q4h   Chronic hypotension, Orthostatic Hypotension, Autonomic Dysfunction  Plan  -Midodrine per tube  Chronic CHF -EF 45-50  Paroxysmal atrial fibrillation with RVR on 2/21  Plan -Cardiac Monitoring  - remains off amiodarone   Recurrent aspiration, chronic protein energy malnutrition Plan -Feeding tubes via PEG tube -Nutrition following   Sacral decub stage IV present on admission  Plan  -Wound care consult  Goals of care Palliative care following - multiple goals of care discussions. Remains full code for now.  Needs ongoing goals of care discussions.   Best practice:  Diet: Tube feed DVT prophylaxis: Heparin SQ GI prophylaxis: PPI Glucose control: [place] Mobility: Bed Code Status: Full Family Communication: As above. Husband  last updated 2/22 Disposition: change to SDU status     Nickolas Madrid, NP Pulmonary/Critical Care Medicine  11/14/2019  9:36 AM

## 2019-11-15 ENCOUNTER — Other Ambulatory Visit: Payer: Self-pay

## 2019-11-15 DIAGNOSIS — L89154 Pressure ulcer of sacral region, stage 4: Secondary | ICD-10-CM

## 2019-11-15 LAB — VANCOMYCIN, RANDOM: Vancomycin Rm: 14

## 2019-11-15 LAB — GLUCOSE, CAPILLARY
Glucose-Capillary: 171 mg/dL — ABNORMAL HIGH (ref 70–99)
Glucose-Capillary: 193 mg/dL — ABNORMAL HIGH (ref 70–99)
Glucose-Capillary: 153 mg/dL — ABNORMAL HIGH (ref 70–99)
Glucose-Capillary: 154 mg/dL — ABNORMAL HIGH (ref 70–99)

## 2019-11-15 LAB — BASIC METABOLIC PANEL
Anion gap: 8 (ref 5–15)
BUN: 94 mg/dL — ABNORMAL HIGH (ref 8–23)
CO2: 21 mmol/L — ABNORMAL LOW (ref 22–32)
Calcium: 7.1 mg/dL — ABNORMAL LOW (ref 8.9–10.3)
Chloride: 114 mmol/L — ABNORMAL HIGH (ref 98–111)
Creatinine, Ser: 0.85 mg/dL (ref 0.44–1.00)
GFR calc Af Amer: >60 mL/min (ref >60)
GFR calc non Af Amer: >60 mL/min (ref >60)
Glucose, Bld: 193 mg/dL — ABNORMAL HIGH (ref 70–99)
Potassium: 3.5 mmol/L (ref 3.5–5.1)
Sodium: 143 mmol/L (ref 135–145)

## 2019-11-15 LAB — CBC
HCT: 24.5 % — ABNORMAL LOW (ref 36.0–46.0)
Hemoglobin: 7.3 g/dL — ABNORMAL LOW (ref 12.0–15.0)
MCH: 28 pg (ref 26.0–34.0)
MCHC: 29.8 g/dL — ABNORMAL LOW (ref 30.0–36.0)
MCV: 93.9 fL (ref 80.0–100.0)
Platelets: 175 10*3/uL (ref 150–400)
RBC: 2.61 MIL/uL — ABNORMAL LOW (ref 3.87–5.11)
RDW: 17.2 % — ABNORMAL HIGH (ref 11.5–15.5)
WBC: 20 10*3/uL — ABNORMAL HIGH (ref 4.0–10.5)
nRBC: 0.4 % — ABNORMAL HIGH (ref 0.0–0.2)

## 2019-11-15 LAB — PHOSPHORUS: Phosphorus: 2.2 mg/dL — ABNORMAL LOW (ref 2.5–4.6)

## 2019-11-15 MED ORDER — VANCOMYCIN HCL IN DEXTROSE 1-5 GM/200ML-% IV SOLN
1000.0000 mg | Freq: Once | INTRAVENOUS | Status: AC
  Administered 2019-11-15: 07:00:00 1000 mg via INTRAVENOUS
  Filled 2019-11-15: qty 200

## 2019-11-15 MED ORDER — ORAL CARE MOUTH RINSE
15.0000 mL | OROMUCOSAL | Status: DC
  Administered 2019-11-15 – 2019-11-17 (×8): 15 mL via OROMUCOSAL

## 2019-11-15 MED ORDER — INSULIN ASPART 100 UNIT/ML ~~LOC~~ SOLN
0.0000 [IU] | SUBCUTANEOUS | Status: DC
  Administered 2019-11-15 – 2019-11-16 (×4): 2 [IU] via SUBCUTANEOUS
  Administered 2019-11-16 – 2019-11-22 (×11): 1 [IU] via SUBCUTANEOUS

## 2019-11-15 NOTE — Progress Notes (Signed)
Patient arrived to unit, wound to sacral area, new foam applied. BUE edema with bruising and skin tears right ear and rt forearm. Bruising BLE.

## 2019-11-15 NOTE — Progress Notes (Addendum)
PT HYDROTHERAPY EVAL 1x--hydrotherapy not warranted at this time-Wound is 85% red. Recommend continued wound care/dressing changes to be performed by nursing. Will sign off.      11/15/19 1200  Subjective Assessment  Subjective pt was nonverbal. she only moaned/made noises   Patient and Family Stated Goals none stated  Date of Onset  (present on admission)  Prior Treatments 1/21 PT hydrotherapy eval and tx last admission  Evaluation and Treatment  Evaluation and Treatment Procedures Explained to Patient/Family Yes  Evaluation and Treatment Procedures Patient unable to consent due to mental status (husband was present but then left room for tx)  Pressure Injury 10/30/2019 Sacrum Medial;Left Stage 4 - Full thickness tissue loss with exposed bone, tendon or muscle. wound w eschar  Date First Assessed/Time First Assessed: 10/29/2019 2130   Location: Sacrum  Location Orientation: Medial;Left  Staging: Stage 4 - Full thickness tissue loss with exposed bone, tendon or muscle.  Wound Description (Comments): wound w eschar  Present on ...  Dressing Type Foam - Lift dressing to assess site every shift;Barrier Film (skin prep);Gauze (Comment);Moist to moist  Dressing Changed  Dressing Change Frequency Twice a day  State of Healing Non-healing  Site / Wound Assessment Red;Pink;Yellow (partially beefy red/maroon wound bed and fascia)  % Wound base Red or Granulating 85%  % Wound base Yellow/Fibrinous Exudate 10%  % Wound base Other/Granulation Tissue (Comment) 5% (fascia)  Peri-wound Assessment Erythema (non-blanchable) (maroon color 2:00-6:00)  Wound Length (cm) 10 cm  Wound Width (cm) 8 cm  Wound Depth (cm) 1 cm  Wound Surface Area (cm^2) 80 cm^2  Wound Volume (cm^3) 80 cm^3  Margins Unattached edges (unapproximated)  Drainage Amount Minimal  Drainage Description Serosanguineous  Treatment Debridement (Selective);Hydrotherapy (Pulse lavage);Packing (Saline gauze)  Hydrotherapy  Pulsed lavage  therapy - wound location sacrum  Pulsed Lavage with Suction (psi) 8 psi  Pulsed Lavage with Suction - Normal Saline Used 1000 mL  Pulsed Lavage Tip Tip with splash shield  Selective Debridement  Selective Debridement - Location sacrum  Selective Debridement - Tools Used Forceps;Scissors  Selective Debridement - Tissue Removed yellow slough, tan/light brown eschar   Wound Therapy - Assess/Plan/Recommendations  Wound Therapy - Clinical Statement 69 yo female admitted to hospital with septic shock. Hx of CVA with L residual weakness, dementia, Parkinsons, sacral wounds, contractures. WC bound PTA.   Wound Therapy - Functional Problem List CVA with L residual weakness, contractures, Parkinson's  Factors Delaying/Impairing Wound Healing Incontinence;Immobility  Hydrotherapy Plan Debridement;Dressing change;Patient/family education;Pulsatile lavage with suction  Wound Therapy - Frequency  (1x eval and tx-hydrotherapy not warranted at this time)  Wound Therapy - Current Recommendations WOC nurse;Case manager/social work  Wound Therapy - Follow Up Recommendations Home health RN  Wound Plan 1x PT hydrotherapy eval and tx. Hydrotherapy not warranted at this time. Recommend continued wound care to be performed by nursing. Will sign off.   Wound Therapy Goals - Improve the function of patient's integumentary system by progressing the wound(s) through the phases of wound healing by:  Goals/treatment plan/discharge plan were made with and agreed upon by patient/family No, Patient unable to participate in goals/treatment/discharge plan and family unavailable  Time For Goal Achievement  (n/a)  Wound Therapy - Potential for Goals Poor    Doreatha Massed, PT Acute Rehabilitation

## 2019-11-15 NOTE — Progress Notes (Signed)
Pt. Transferred from ICU. Report received from [place], Therapist, sports. Skin assessment completed with Musician.

## 2019-11-15 NOTE — Progress Notes (Signed)
Pharmacy Antibiotic Note  Barbara Flowers is a 69 y.o. female admitted on 10/24/2019 with sepsis.  Pharmacy has been consulted for vancomycin dosing.  Plan: Vancomycin 1gm iv x1    Height: 5\' 8"  (172.7 cm) Weight: 127 lb 3.3 oz (57.7 kg) IBW/kg (Calculated) : 63.9  Temp (24hrs), Avg:97.4 F (36.3 C), Min:96.7 F (35.9 C), Max:97.9 F (36.6 C)  Recent Labs  Lab 10/25/2019 1151 11/16/2019 1740 11/14/2019 1800 11/01/2019 1800 10/23/2019 2231 11/12/19 0245 11/13/19 0531 11/13/19 0609 11/13/19 1207 11/13/19 1600 11/14/19 0215 11/15/19 0251  WBC   < >  --  28.0*  --   --  33.7*  --  27.6*  --   --  23.9* 20.0*  CREATININE   < > 3.54* 3.34*   < >  --  2.71* 1.66*  --   --  1.30* 1.12* 0.85  LATICACIDVEN  --  2.1* 2.8*  --  3.6*  --  2.7*  --  1.9  --   --   --   VANCORANDOM  --   --   --   --   --   --  10  --   --   --   --  14   < > = values in this interval not displayed.    Estimated Creatinine Clearance: 56.9 mL/min (by C-G formula based on SCr of 0.85 mg/dL).    Allergies  Allergen Reactions  . Gabapentin Other (See Comments)    seizure  . Tramadol Other (See Comments)    Brings on Seizures     Antimicrobials this admission: 2/20 vanc >> 2/20 cefepime >> 2/20 flagyl >>    Dose adjustments this admission: 2/22 at 0531 VR= 10 (38 hrs after 1250 mg dose) 2/24 @ 0251 VR = 14 (last dose 1250mg  iv x1 2/22 at 1247)  Microbiology results: -09/27/19 sputum: MRSA 09/16/19 blood: proteus, CNS  2/20 BCx x2:  2/20 UCx Cath (PTA foley): 2/20 UCx I/O Cath: insignif growth FINAL 2/20 MRSA PCR: positive  Thank you for allowing pharmacy to be a part of this patient's care.  Nani Skillern Crowford 11/15/2019 6:06 AM

## 2019-11-15 NOTE — Progress Notes (Signed)
[place] Progress Note Patient Name: Barbara Flowers DOB: 1951-08-06 MRN: [id number]   Date of Service  11/15/2019  HPI/Events of Note  Hyperglycemia - Blood glucose = 193.   eICU Interventions  Will order: 1. Q 4 hour sensitive Novolog SSI.      Intervention Category Major Interventions: Hyperglycemia - active titration of insulin therapy  Dimitrius Steedman [place] 11/15/2019, 6:18 AM

## 2019-11-15 NOTE — Progress Notes (Signed)
PROGRESS NOTE    Barbara Flowers  IOE:[id number] DOB: [birth year] DOA: 10/27/2019 PCP: [name], NP    Brief Narrative:  Barbara Flowers is a 69 year old Caucasian female with past medical history of breast cancer status post mastectomy, brain aneurysm, dementia, CVA with left-sided hemiplegia, chronic contractures, stage IV sacral decubitus ulcer, Shy-Drager syndrome, LV thrombus, proximal atrial fibrillation/flutter who presented to the ED with decreased urine output, altered mental status.  She was started on Levaquin outpatient for urinary tract infection, but unfortunately she continued to worsen.  In the ED she was noted to be in septic shock likely secondary to urinary source complicated by chronic indwelling Foley catheter which was occluded with associated acute renal failure and hyperkalemia.  Patient was initially admitted under the pulmonary/critical care service as she was requiring vasopressors.  She was transferred to the [facility] hospital service on 11/15/2019.  Patient was recently admitted to the hospital from 11/16/2018 through 10/21/2019 for respiratory failure, MRSA pneumonia and congestive heart failure.   Assessment & Plan:   Active Problems:   Pressure injury of skin   Failure to thrive in adult   Altered mental status, unspecified   Septic shock (HCC)   Septic shock, present on admission Enterococcus faecalis urinary tract infection Chronic indwelling Foley catheter Patient presenting from home with altered mental status, decreased urine output.  Found to be in septic shock secondary to UTI complicated by blocks of chronic Foley catheter.  Foley catheter exchanged on admission, initially requiring vasopressors and stress dose steroids which have been weaned off.  Urine culture notable for Enterococcus faecalis with pan sensitivity.  Also complicated by possible underlying infection in regards to her chronic sacral decubitus ulcer. MRSA PCR positive. --WBC  36.1-->23.9-->20.0 --Blood cultures showed no growth x3 days --Continue empiric antibiotics with vancomycin, cefepime, Flagyl --Monitor fever curve and CBC daily --Diagnosed, closely monitoring urine output  Acute renal failure: resolved Hyperkalemia secondary to septic shock and urinary retention Creatinine 4.27 on admission; noted to have blocked chronic Foley catheter which was exchanged on admission.  Potassium 7.1 on admission.  Also complicated by septic shock requiring vasopressors.  Continues on antibiotics for E. coli faecalis UTI. --Cr 4.27-->0.85 --K 7.1-->3.5 today --Continue strict I's and O's --Monitor renal function and electrolytes closely daily  History of orthostatic hypotension with autonomic dysfunction --Continue home midodrine 10 mg per tube TID --Currently on hydrocortisone 50 mg IV every 12 hours, will continue to wean off.  Paroxysmal atrial fibrillation with RVR Chronic systolic congestive heart failure Patient was noted to be in A. fib with RVR in 11/11/2018, given IV amiodarone bolus.  Currently rate controlled. --Remains off of amiodarone --Continue monitor on telemetry  History of CVA with recurrent aspiration and left-sided hemiplegia; contractures Severe protein calorie malnutrition --Supportive care, on tube feeds --Nutrition following  Stage IV sacral decubitus ulcer, present on admission --Wound care following --Was previously receiving hydrotherapy with PT, will reconsult for evaluation and consideration of continuing  Goals of care Palliative care following, appreciate assistance.  Currently remains full code.  Will need ongoing goals of care discussions.  Ultimately very grim/poor prognosis   DVT prophylaxis: Heparin Code Status: Full code Family Communication: Updated patient's spouse, Christia Reading in fourth floor awaiting room Disposition Plan: Transfer from ICU to telemetry     Patient is from: Home     Anticipated Disposition:  To be  determined     Barriers to discharge or conditions that needs to be met prior to discharge: Continue need for IV  antibiotics  Consultants:   PCCM -signed off 11/15/2019  Palliative care  Nephrology  Procedures:   none  Antimicrobials:   Vancomycin 2/20>>  Cefepime 2/20>>  Flagyl 2/20>>   Subjective: Patient seen and examined bedside, moaning.  Will respond to verbal stimuli with simple commands.  No significant changes per pulmonary critical care notes yesterday and nursing staff today.  Per patient's nurse this morning, patient was confused during her previous hospitalization up to the point of discharge when she was a little bit more oriented.  Patient is hemodynamically stable, not requiring any pressors or significant intervention.  Oxygenating well on room air.  Stable for transfer to telemetry.  Unable to attain any further ROS from patient given her current mental status.  No family present at bedside.  No acute concerns per nursing staff this morning  Objective: Vitals:   11/15/19 0800 11/15/19 0900 11/15/19 1000 11/15/19 1118  BP: (!) 154/72 (!) 157/74 (!) 155/60 (!) 154/64  Pulse: 71 70 68 73  Resp: 13 14 16    Temp: (!) 97.3 F (36.3 C)   97.8 F (36.6 C)  TempSrc: Axillary   Axillary  SpO2: 100% 100% 100% 96%  Weight:      Height:        Intake/Output Summary (Last 24 hours) at 11/15/2019 1206 Last data filed at 11/15/2019 1128 Gross per 24 hour  Intake 3653.65 ml  Output 800 ml  Net 2853.65 ml   Filed Weights   11/13/19 0500 11/14/19 0500 11/15/19 0405  Weight: 57.6 kg 57.7 kg 57.7 kg    Examination:  General exam: Moaning, will respond to simple commands, grasping hands; severely ill/cachectic in appearance Respiratory system: Decreased breath sounds bilateral bases, no crackles/wheezing, normal respiratory effort, oxygenating well on room air Cardiovascular system: S1 & S2 heard, RRR. No JVD, murmurs, rubs, gallops or clicks. No pedal  edema. Gastrointestinal system: Abdomen is nondistended, soft and nontender. No organomegaly or masses felt. Normal bowel sounds heard.  PEG tube noted Central nervous system: Alert. No focal neurological deficits. Extremities: Moves right upper extremity dependently, minimal movement of left upper extremity. Skin: No rashes, lesions or ulcers Psychiatry: Judgement and insight appear poor.     Data Reviewed: I have personally reviewed following labs and imaging studies  CBC: Recent Labs  Lab 10/24/2019 1151 11/02/2019 1151 10/26/2019 1800 11/12/19 0245 11/13/19 0609 11/14/19 0215 11/15/19 0251  WBC 36.1*   < > 28.0* 33.7* 27.6* 23.9* 20.0*  NEUTROABS 33.1*  --   --   --   --   --   --   HGB 8.3*   < > 7.9* 8.7* 8.0* 7.8* 7.3*  HCT 27.4*   < > 26.7* 29.8* 25.2* 25.8* 24.5*  MCV 93.2   < > 94.0 96.4 88.4 93.8 93.9  PLT 239   < > 187 218 201 194 175   < > = values in this interval not displayed.   Basic Metabolic Panel: Recent Labs  Lab 11/12/19 0245 11/12/19 0621 11/12/19 0825 11/12/19 1158 11/12/19 1714 11/12/19 1714 11/13/19 0531 11/13/19 0817 11/13/19 1600 11/13/19 1700 11/14/19 0215 11/15/19 0251  NA 141  --   --   --   --   --  146*  --  146*  --  144 143  K 5.2*   < >   < >  --  4.3   < > 3.1* 3.5 3.0*  --  3.0* 3.5  CL 106  --   --   --   --   --  111  --  113*  --  113* 114*  CO2 22  --   --   --   --   --  23  --  22  --  21* 21*  GLUCOSE 74  --   --   --   --   --  142*  --  136*  --  176* 193*  BUN 136*  --   --   --   --   --  113*  --  111*  --  96* 94*  CREATININE 2.71*  --   --   --   --   --  1.66*  --  1.30*  --  1.12* 0.85  CALCIUM 9.0  --   --   --   --   --  8.2*  --  7.6*  --  7.6* 7.1*  MG 2.2  --   --  2.2 1.8  --  1.7  --   --  2.2  --   --   PHOS 3.4   < >  --  4.0 4.5  --  4.3  --   --  3.7  --  2.2*   < > = values in this interval not displayed.   GFR: Estimated Creatinine Clearance: 56.9 mL/min (by C-G formula based on SCr of 0.85  mg/dL). Liver Function Tests: Recent Labs  Lab 10/25/2019 1151  AST 58*  ALT 43  ALKPHOS 179*  BILITOT 0.7  PROT 5.9*  ALBUMIN 1.9*   No results for input(s): LIPASE, AMYLASE in the last 168 hours. No results for input(s): AMMONIA in the last 168 hours. Coagulation Profile: Recent Labs  Lab 10/27/2019 1151  INR 1.3*   Cardiac Enzymes: No results for input(s): CKTOTAL, CKMB, CKMBINDEX, TROPONINI in the last 168 hours. BNP (last 3 results) No results for input(s): PROBNP in the last 8760 hours. HbA1C: No results for input(s): HGBA1C in the last 72 hours. CBG: Recent Labs  Lab 11/14/19 2026 11/14/19 2305 11/15/19 0317 11/15/19 0823 11/15/19 1136  GLUCAP 150* 171* 171* 193* 153*   Lipid Profile: No results for input(s): CHOL, HDL, LDLCALC, TRIG, CHOLHDL, LDLDIRECT in the last 72 hours. Thyroid Function Tests: No results for input(s): TSH, T4TOTAL, FREET4, T3FREE, THYROIDAB in the last 72 hours. Anemia Panel: No results for input(s): VITAMINB12, FOLATE, FERRITIN, TIBC, IRON, RETICCTPCT in the last 72 hours. Sepsis Labs: Recent Labs  Lab 10/31/2019 1740 11/14/2019 1740 10/26/2019 1800 10/31/2019 2231 11/13/19 0531 11/13/19 1207  PROCALCITON 98.98  --   --   --   --   --   LATICACIDVEN 2.1*   < > 2.8* 3.6* 2.7* 1.9   < > = values in this interval not displayed.    Recent Results (from the past 240 hour(s))  Culture, blood (Routine x 2)     Status: None (Preliminary result)   Collection Time: 10/31/2019 11:47 AM   Specimen: BLOOD  Result Value Ref Range Status   Specimen Description   Final    BLOOD RIGHT ANTECUBITAL Performed at [facility] [address], [place], [place] [address]    Special Requests   Final    BOTTLES DRAWN AEROBIC AND ANAEROBIC Blood Culture adequate volume Performed at [facility] [address] [place], [place] [address]    Culture   Final    NO GROWTH 3 DAYS Performed at [facility], [place] [address], [place], [place] [address]  Report Status PENDING  Incomplete  Culture, blood (Routine x 2)     Status: None (Preliminary result)   Collection Time: 11/17/2019 11:57 AM   Specimen: BLOOD  Result Value Ref Range Status   Specimen Description   Final    BLOOD LEFT ANTECUBITAL Performed at [facility] [address], [place], [place] [address]    Special Requests   Final    BOTTLES DRAWN AEROBIC AND ANAEROBIC Blood Culture adequate volume Performed at [facility] [address], [place], [place] [address]    Culture   Final    NO GROWTH 3 DAYS Performed at [facility], [place] [address], [place], [place] [address]    Report Status PENDING  Incomplete  Urine culture     Status: Abnormal   Collection Time: 10/26/2019 12:11 PM   Specimen: Urine, Random  Result Value Ref Range Status   Specimen Description   Final    URINE, RANDOM Performed at [facility] [address], [place], [place] [address]    Special Requests   Final    NONE Performed at [facility], [place] [address], [place], [place] [address]    Culture (A)  Final    <10,000 COLONIES/mL INSIGNIFICANT GROWTH Performed at [facility] [address] [place], [place] [address]    Report Status 11/12/2019 FINAL  Final  Urine culture     Status: Abnormal   Collection Time: 11/17/2019  3:55 PM   Specimen: Urine, Catheterized  Result Value Ref Range Status   Specimen Description   Final    URINE, CATHETERIZED Performed at [facility] [address], [place], [place] [address]    Special Requests   Final    NONE Performed at [facility], [place] [address] [place], [place] [address]    Culture 40,000 COLONIES/mL ENTEROCOCCUS FAECALIS (A)  Final   Report Status 11/14/2019 FINAL  Final   Organism ID, Bacteria ENTEROCOCCUS FAECALIS (A)  Final      Susceptibility   Enterococcus faecalis -  MIC*    AMPICILLIN <=2 SENSITIVE Sensitive     NITROFURANTOIN <=16 SENSITIVE Sensitive     VANCOMYCIN 1 SENSITIVE Sensitive     * 40,000 COLONIES/mL ENTEROCOCCUS FAECALIS  MRSA PCR Screening     Status: Abnormal   Collection Time: 11/05/2019  8:54 PM   Specimen: Nasopharyngeal  Result Value Ref Range Status   MRSA by PCR POSITIVE (A) NEGATIVE Final    Comment:        The GeneXpert MRSA Assay (FDA approved for NASAL specimens only), is one component of a comprehensive MRSA colonization surveillance program. It is not intended to diagnose MRSA infection nor to guide or monitor treatment for MRSA infections. RESULT CALLED TO, READ BACK BY AND VERIFIED WITH: A,ROTHERMEL AT 2311 ON 11/01/2019 BY A,MOHAMED Performed at [facility] [address] [place], [place] [address]   SARS CORONAVIRUS 2 (TAT 6-24 HRS) Nasopharyngeal Nasopharyngeal Swab     Status: None   Collection Time: 11/12/2019  9:17 PM   Specimen: Nasopharyngeal Swab  Result Value Ref Range Status   SARS Coronavirus 2 NEGATIVE NEGATIVE Final    Comment: (NOTE) SARS-CoV-2 target nucleic acids are NOT DETECTED. The SARS-CoV-2 RNA is generally detectable in upper and lower respiratory specimens during the acute phase of infection. Negative results do not preclude SARS-CoV-2 infection, do not rule out co-infections with other pathogens, and should  not be used as the sole basis for treatment or other patient management decisions. Negative results must be combined with clinical observations, patient history, and epidemiological information. The expected result is Negative. Fact Sheet for Patients: SugarRoll.be Fact Sheet for Healthcare Providers: https://www.woods-mathews.com/ This test is not yet approved or cleared by the [place] FDA and  has been authorized for detection and/or diagnosis of SARS-CoV-2 by FDA under an Emergency Use Authorization (EUA). This EUA  will remain  in effect (meaning this test can be used) for the duration of the COVID-19 declaration under Section 56 4(b)(1) of the Act, 21 U.S.C. section 360bbb-3(b)(1), unless the authorization is terminated or revoked sooner. Performed at [facility], [place] [address] [place], [place] [address]          Radiology Studies: No results found.      Scheduled Meds:  chlorhexidine gluconate (MEDLINE KIT)  15 mL Mouth Rinse BID   Chlorhexidine Gluconate Cloth  6 each Topical Daily   feeding supplement (PRO-STAT SUGAR FREE 64)  30 mL Per Tube BID   free water  150 mL Per Tube Q4H   heparin  5,000 Units Subcutaneous Q8H   hydrocortisone sod succinate (SOLU-CORTEF) inj  50 mg Intravenous Q12H   insulin aspart  0-9 Units Subcutaneous Q4H   mouth rinse  15 mL Mouth Rinse 10 times per day   midodrine  10 mg Per Tube TID WC   mupirocin ointment   Nasal BID   pantoprazole sodium  40 mg Per Tube Daily   sodium chloride flush  10-40 mL Intracatheter Q12H   vancomycin variable dose per unstable renal function (pharmacist dosing)   Does not apply See admin instructions   Continuous Infusions:  sodium chloride Stopped (11/15/19 1050)   ceFEPime (MAXIPIME) IV Stopped (11/15/19 0904)   feeding supplement (OSMOLITE 1.5 CAL) 50 mL/hr at 11/15/19 0600   metronidazole Stopped (11/15/19 0606)     LOS: 4 days    Time spent: 42 minutes spent on chart review, discussion with nursing staff, consultants, updating family and interview/physical exam; more than 50% of that time was spent in counseling and/or coordination of care.    Antanisha Mohs J [place] DO [facility] Available via Epic secure chat 7am-7pm After these hours, please refer to coverage provider listed on [email or web address] 11/15/2019, 12:06 PM

## 2019-11-16 ENCOUNTER — Inpatient Hospital Stay (HOSPITAL_COMMUNITY): Payer: Medicare Other

## 2019-11-16 DIAGNOSIS — R6520 Severe sepsis without septic shock: Secondary | ICD-10-CM

## 2019-11-16 DIAGNOSIS — B952 Enterococcus as the cause of diseases classified elsewhere: Secondary | ICD-10-CM | POA: Diagnosis present

## 2019-11-16 DIAGNOSIS — N179 Acute kidney failure, unspecified: Secondary | ICD-10-CM

## 2019-11-16 LAB — CBC
HCT: 24.8 % — ABNORMAL LOW (ref 36.0–46.0)
Hemoglobin: 7.6 g/dL — ABNORMAL LOW (ref 12.0–15.0)
MCH: 28.5 pg (ref 26.0–34.0)
MCHC: 30.6 g/dL (ref 30.0–36.0)
MCV: 92.9 fL (ref 80.0–100.0)
Platelets: 183 10*3/uL (ref 150–400)
RBC: 2.67 MIL/uL — ABNORMAL LOW (ref 3.87–5.11)
RDW: 17.3 % — ABNORMAL HIGH (ref 11.5–15.5)
WBC: 32.3 10*3/uL — ABNORMAL HIGH (ref 4.0–10.5)
nRBC: 0.6 % — ABNORMAL HIGH (ref 0.0–0.2)

## 2019-11-16 LAB — CULTURE, BLOOD (ROUTINE X 2)
Culture: NO GROWTH
Culture: NO GROWTH
Special Requests: ADEQUATE
Special Requests: ADEQUATE

## 2019-11-16 LAB — MAGNESIUM: Magnesium: 1.8 mg/dL (ref 1.7–2.4)

## 2019-11-16 LAB — BASIC METABOLIC PANEL
Anion gap: 7 (ref 5–15)
BUN: 71 mg/dL — ABNORMAL HIGH (ref 8–23)
CO2: 22 mmol/L (ref 22–32)
Calcium: 7.1 mg/dL — ABNORMAL LOW (ref 8.9–10.3)
Chloride: 114 mmol/L — ABNORMAL HIGH (ref 98–111)
Creatinine, Ser: 0.68 mg/dL (ref 0.44–1.00)
GFR calc Af Amer: >60 mL/min (ref >60)
GFR calc non Af Amer: >60 mL/min (ref >60)
Glucose, Bld: 144 mg/dL — ABNORMAL HIGH (ref 70–99)
Potassium: 3.7 mmol/L (ref 3.5–5.1)
Sodium: 143 mmol/L (ref 135–145)

## 2019-11-16 LAB — GLUCOSE, CAPILLARY
Glucose-Capillary: 106 mg/dL — ABNORMAL HIGH (ref 70–99)
Glucose-Capillary: 122 mg/dL — ABNORMAL HIGH (ref 70–99)
Glucose-Capillary: 124 mg/dL — ABNORMAL HIGH (ref 70–99)
Glucose-Capillary: 129 mg/dL — ABNORMAL HIGH (ref 70–99)
Glucose-Capillary: 132 mg/dL — ABNORMAL HIGH (ref 70–99)
Glucose-Capillary: 134 mg/dL — ABNORMAL HIGH (ref 70–99)
Glucose-Capillary: 155 mg/dL — ABNORMAL HIGH (ref 70–99)

## 2019-11-16 LAB — VANCOMYCIN, RANDOM: Vancomycin Rm: 19

## 2019-11-16 MED ORDER — VANCOMYCIN HCL IN DEXTROSE 1-5 GM/200ML-% IV SOLN
1000.0000 mg | INTRAVENOUS | Status: DC
  Administered 2019-11-17 – 2019-11-19 (×3): 1000 mg via INTRAVENOUS
  Filled 2019-11-16 (×3): qty 200

## 2019-11-16 MED ORDER — VANCOMYCIN HCL 750 MG/150ML IV SOLN
750.0000 mg | Freq: Once | INTRAVENOUS | Status: AC
  Administered 2019-11-16: 06:00:00 750 mg via INTRAVENOUS
  Filled 2019-11-16: qty 150

## 2019-11-16 MED ORDER — FUROSEMIDE 10 MG/ML IJ SOLN
40.0000 mg | Freq: Once | INTRAMUSCULAR | Status: AC
  Administered 2019-11-16: 16:00:00 40 mg via INTRAVENOUS
  Filled 2019-11-16: qty 4

## 2019-11-16 MED ORDER — HYDROCORTISONE NA SUCCINATE PF 100 MG IJ SOLR
50.0000 mg | Freq: Every day | INTRAMUSCULAR | Status: AC
  Administered 2019-11-17: 50 mg via INTRAVENOUS
  Filled 2019-11-16: qty 2

## 2019-11-16 MED ORDER — SODIUM CHLORIDE 0.9 % IV SOLN
2.0000 g | Freq: Three times a day (TID) | INTRAVENOUS | Status: DC
  Administered 2019-11-16 – 2019-11-19 (×9): 2 g via INTRAVENOUS
  Filled 2019-11-16 (×10): qty 2

## 2019-11-16 NOTE — Progress Notes (Signed)
PROGRESS NOTE    Barbara Flowers  INO:[id number] DOB: May 25, 1951 DOA: 10/23/2019 PCP: [name], NP    Brief Narrative:  Barbara Flowers is a 69 year old Caucasian female with past medical history of breast cancer status post mastectomy, brain aneurysm, dementia, CVA with left-sided hemiplegia, chronic contractures, stage IV sacral decubitus ulcer, Shy-Drager syndrome, LV thrombus, proximal atrial fibrillation/flutter who presented to the ED with decreased urine output, altered mental status.  She was started on Levaquin outpatient for urinary tract infection, but unfortunately she continued to worsen.  In the ED she was noted to be in septic shock likely secondary to urinary source complicated by chronic indwelling Foley catheter which was occluded with associated acute renal failure and hyperkalemia.  Patient was initially admitted under the pulmonary/critical care service as she was requiring vasopressors.  She was transferred to the [facility] hospital service on 11/15/2019.  Patient was recently admitted to the hospital from 11/16/2018 through 10/21/2019 for respiratory failure, MRSA pneumonia and congestive heart failure.   Assessment & Plan:   Active Problems:   Pressure injury of skin   Failure to thrive in adult   Altered mental status, unspecified   Septic shock (HCC)   Septic shock, present on admission Enterococcus faecalis urinary tract infection Chronic indwelling Foley catheter Patient presenting from home with altered mental status, decreased urine output.  Found to be in septic shock secondary to UTI complicated by blocks of chronic Foley catheter.  Foley catheter exchanged on admission, initially requiring vasopressors and stress dose steroids which have been weaned off.  Urine culture notable for Enterococcus faecalis with pan sensitivity.  Also complicated by possible underlying infection in regards to her chronic sacral decubitus ulcer. MRSA PCR positive. --WBC  36.1-->23.9-->20.0-->32.3 (titrating off hydrocortisone) --Blood cultures showed no growth x4 days --Continue empiric antibiotics with vancomycin, cefepime, Flagyl; plan 10 day course; messaged pharmacy to place stop dates --closely monitor urine output, CBC daily  Acute renal failure: resolved Hyperkalemia secondary to septic shock and urinary retention Creatinine 4.27 on admission; noted to have blocked chronic Foley catheter which was exchanged on admission.  Potassium 7.1 on admission.  Also complicated by septic shock requiring vasopressors.  Continues on antibiotics for E. coli faecalis UTI. --Cr 4.27-->0.85-->0.68 --K 7.1-->3.5-->3.7 today --Continue strict I's and O's --Monitor renal function and electrolytes closely daily  History of orthostatic hypotension with autonomic dysfunction --Continue home midodrine 10 mg per tube TID --Currently on hydrocortisone 50 mg IV every 12 hours, plan to dc tomorrow --monitor BP closely  Paroxysmal atrial fibrillation with RVR Chronic systolic congestive heart failure Patient was noted to be in A. fib with RVR in 11/11/2018, given IV amiodarone bolus.  Currently rate controlled. --Remains off of amiodarone --Continue monitor on telemetry  History of CVA with recurrent aspiration and left-sided hemiplegia; contractures Severe protein calorie malnutrition --Supportive care, on tube feeds --Nutrition following  Stage IV sacral decubitus ulcer, present on admission Pressure Injury 10/27/2019 Sacrum Medial;Left Stage 4 - Full thickness tissue loss with exposed bone, tendon or muscle. wound w eschar (Active)  10/27/2019 2130  Location: Sacrum  Location Orientation: Medial;Left  Staging: Stage 4 - Full thickness tissue loss with exposed bone, tendon or muscle.  Wound Description (Comments): wound w eschar  Present on Admission: Yes     Pressure Injury 09/17/19 Thigh Left;Posterior Stage 2 -  Partial thickness loss of dermis presenting as a  shallow open injury with a red, pink wound bed without slough. pink/red and peeling skin (Active)  09/17/19 0245  Location: Thigh  Location Orientation: Left;Posterior  Staging: Stage 2 -  Partial thickness loss of dermis presenting as a shallow open injury with a red, pink wound bed without slough.  Wound Description (Comments): pink/red and peeling skin  Present on Admission: Yes     Pressure Injury 10/05/19 Ear Right Stage 2 -  Partial thickness loss of dermis presenting as a shallow open injury with a red, pink wound bed without slough. (Active)  10/05/19 1011  Location: Ear  Location Orientation: Right  Staging: Stage 2 -  Partial thickness loss of dermis presenting as a shallow open injury with a red, pink wound bed without slough.  Wound Description (Comments):   Present on Admission: No     Pressure Injury 11/06/2019 Foot Left;Lateral Unstageable - Full thickness tissue loss in which the base of the injury is covered by slough (yellow, tan, gray, green or brown) and/or eschar (tan, brown or black) in the wound bed. (Active)  10/25/2019 2130  Location: Foot  Location Orientation: Left;Lateral  Staging: Unstageable - Full thickness tissue loss in which the base of the injury is covered by slough (yellow, tan, gray, green or brown) and/or eschar (tan, brown or black) in the wound bed.  Wound Description (Comments):   Present on Admission: Yes   --Wound care following --Was previously receiving hydrotherapy with PT, reconsulted for return to hydrotherapy, not indicated at this time per PT. --Continue local wound care, offloading, air mattress  Goals of care Palliative care following, appreciate assistance.  Currently remains full code.  Will need ongoing goals of care discussions.  Ultimately very grim/poor prognosis   DVT prophylaxis: Heparin Code Status: Full code Family Communication: Updated patient's spouse, Christia Reading extensively at bedside yesterday Disposition Plan: Continue  current level of care, 10 days of IV antibiotics, anticipate discharge back home with home health     Patient is from: Home     Anticipated Disposition:  Home     Barriers to discharge or conditions that needs to be met prior to discharge: Completion of 10-day course of IV antibiotics  Consultants:   PCCM - signed off 11/15/2019  Palliative care  Nephrology  Procedures:   none  Antimicrobials:   Vancomycin 2/20>>  Cefepime 2/20>>  Flagyl 2/20>>   Subjective: Patient seen and examined bedside, sleeping; when aroused moans to any type of stimulus.  Will move right hand to command.  No significant change over the past 2 days or, significant change during hospitalization on review of previous critical care notes.  Remains hemodynamically stable, on room air with heart rate controlled. Unable to attain any further ROS from patient given her current mental status.  No family present at bedside.  No acute concerns per nursing staff this morning  Objective: Vitals:   11/15/19 2203 11/16/19 0014 11/16/19 0636 11/16/19 1023  BP: (!) 144/73 (!) 147/72 (!) 141/69 (!) 167/73  Pulse: 70 73 76 82  Resp: 20 19 20 20   Temp: 98.4 F (36.9 C) 98.3 F (36.8 C) 99.4 F (37.4 C)   TempSrc: Oral Oral Oral   SpO2: 99% 97% 96% 94%  Weight:      Height:        Intake/Output Summary (Last 24 hours) at 11/16/2019 1152 Last data filed at 11/16/2019 1029 Gross per 24 hour  Intake 1029.3 ml  Output 1076 ml  Net -46.7 ml   Filed Weights   11/13/19 0500 11/14/19 0500 11/15/19 0405  Weight: 57.6 kg 57.7 kg 57.7 kg  Examination:  General exam: Moaning, will respond to simple commands, grasping hands; severely ill/cachectic in appearance Respiratory system: Decreased breath sounds bilateral bases, no crackles/wheezing, normal respiratory effort, oxygenating well on room air Cardiovascular system: S1 & S2 heard, RRR. No JVD, murmurs, rubs, gallops or clicks. No pedal edema. Gastrointestinal  system: Abdomen is nondistended, soft and nontender. No organomegaly or masses felt. Normal bowel sounds heard.  PEG tube noted Central nervous system: Alert. No focal neurological deficits. Extremities: Moves right upper extremity dependently, minimal movement of left upper extremity. Skin: No rashes, lesions or ulcers Psychiatry: Judgement and insight appear poor.     Data Reviewed: I have personally reviewed following labs and imaging studies  CBC: Recent Labs  Lab 11/07/2019 1151 11/08/2019 1800 11/12/19 0245 11/13/19 0609 11/14/19 0215 11/15/19 0251 11/16/19 0411  WBC 36.1*   < > 33.7* 27.6* 23.9* 20.0* 32.3*  NEUTROABS 33.1*  --   --   --   --   --   --   HGB 8.3*   < > 8.7* 8.0* 7.8* 7.3* 7.6*  HCT 27.4*   < > 29.8* 25.2* 25.8* 24.5* 24.8*  MCV 93.2   < > 96.4 88.4 93.8 93.9 92.9  PLT 239   < > 218 201 194 175 183   < > = values in this interval not displayed.   Basic Metabolic Panel: Recent Labs  Lab 11/12/19 0245 11/12/19 0825 11/12/19 1158 11/12/19 1714 11/13/19 0531 11/13/19 0531 11/13/19 0817 11/13/19 1600 11/13/19 1700 11/14/19 0215 11/15/19 0251 11/16/19 0411  NA   < >  --   --   --  146*  --   --  146*  --  144 143 143  K   < >   < >  --  4.3 3.1*   < > 3.5 3.0*  --  3.0* 3.5 3.7  CL   < >  --   --   --  111  --   --  113*  --  113* 114* 114*  CO2   < >  --   --   --  23  --   --  22  --  21* 21* 22  GLUCOSE   < >  --   --   --  142*  --   --  136*  --  176* 193* 144*  BUN   < >  --   --   --  113*  --   --  111*  --  96* 94* 71*  CREATININE   < >  --   --   --  1.66*  --   --  1.30*  --  1.12* 0.85 0.68  CALCIUM   < >  --   --   --  8.2*  --   --  7.6*  --  7.6* 7.1* 7.1*  MG  --   --  2.2 1.8 1.7  --   --   --  2.2  --   --  1.8  PHOS  --   --  4.0 4.5 4.3  --   --   --  3.7  --  2.2*  --    < > = values in this interval not displayed.   GFR: Estimated Creatinine Clearance: 60.5 mL/min (by C-G formula based on SCr of 0.68 mg/dL). Liver Function  Tests: Recent Labs  Lab 11/03/2019 1151  AST 58*  ALT 43  ALKPHOS 179*  BILITOT 0.7  PROT 5.9*  ALBUMIN 1.9*   No results for input(s): LIPASE, AMYLASE in the last 168 hours. No results for input(s): AMMONIA in the last 168 hours. Coagulation Profile: Recent Labs  Lab 10/28/2019 1151  INR 1.3*   Cardiac Enzymes: No results for input(s): CKTOTAL, CKMB, CKMBINDEX, TROPONINI in the last 168 hours. BNP (last 3 results) No results for input(s): PROBNP in the last 8760 hours. HbA1C: No results for input(s): HGBA1C in the last 72 hours. CBG: Recent Labs  Lab 11/15/19 1548 11/15/19 2029 11/16/19 0013 11/16/19 0423 11/16/19 0837  GLUCAP 154* 124* 134* 132* 129*   Lipid Profile: No results for input(s): CHOL, HDL, LDLCALC, TRIG, CHOLHDL, LDLDIRECT in the last 72 hours. Thyroid Function Tests: No results for input(s): TSH, T4TOTAL, FREET4, T3FREE, THYROIDAB in the last 72 hours. Anemia Panel: No results for input(s): VITAMINB12, FOLATE, FERRITIN, TIBC, IRON, RETICCTPCT in the last 72 hours. Sepsis Labs: Recent Labs  Lab 10/29/2019 1740 11/08/2019 1740 11/07/2019 1800 11/01/2019 2231 11/13/19 0531 11/13/19 1207  PROCALCITON 98.98  --   --   --   --   --   LATICACIDVEN 2.1*   < > 2.8* 3.6* 2.7* 1.9   < > = values in this interval not displayed.    Recent Results (from the past 240 hour(s))  Culture, blood (Routine x 2)     Status: None (Preliminary result)   Collection Time: 10/24/2019 11:47 AM   Specimen: BLOOD  Result Value Ref Range Status   Specimen Description   Final    BLOOD RIGHT ANTECUBITAL Performed at [facility] [address], [place], [place] [address]    Special Requests   Final    BOTTLES DRAWN AEROBIC AND ANAEROBIC Blood Culture adequate volume Performed at [facility] [address] [place], [place] [address]    Culture   Final    NO GROWTH 4 DAYS Performed at [facility], [place] [address], [place],  [place] [address]    Report Status PENDING  Incomplete  Culture, blood (Routine x 2)     Status: None (Preliminary result)   Collection Time: 10/26/2019 11:57 AM   Specimen: BLOOD  Result Value Ref Range Status   Specimen Description   Final    BLOOD LEFT ANTECUBITAL Performed at [facility] [address], [place], [place] [address]    Special Requests   Final    BOTTLES DRAWN AEROBIC AND ANAEROBIC Blood Culture adequate volume Performed at [facility] [address], [place], [place] [address]    Culture   Final    NO GROWTH 4 DAYS Performed at [facility], [place] [address], [place], [place] [address]    Report Status PENDING  Incomplete  Urine culture     Status: Abnormal   Collection Time: 11/07/2019 12:11 PM   Specimen: Urine, Random  Result Value Ref Range Status   Specimen Description   Final    URINE, RANDOM Performed at [facility] [address], [place], [place] [address]    Special Requests   Final    NONE Performed at [facility], [place] [address] [place], [place] [address]    Culture (A)  Final    <10,000 COLONIES/mL INSIGNIFICANT GROWTH Performed at [facility] [address], [place],  [address]    Report Status 11/12/2019 FINAL  Final  Urine culture     Status: Abnormal   Collection  Time: 11/12/2019  3:55 PM   Specimen: Urine, Catheterized  Result Value Ref Range Status   Specimen Description   Final    URINE, CATHETERIZED Performed at [facility] [address] [place], [place] [address]    Special Requests   Final    NONE Performed at [facility], [place] [address] [place], [place] [address]    Culture 40,000 COLONIES/mL ENTEROCOCCUS FAECALIS (A)  Final   Report Status 11/14/2019 FINAL  Final   Organism ID, Bacteria ENTEROCOCCUS FAECALIS (A)  Final      Susceptibility   Enterococcus faecalis - MIC*    AMPICILLIN <=2  SENSITIVE Sensitive     NITROFURANTOIN <=16 SENSITIVE Sensitive     VANCOMYCIN 1 SENSITIVE Sensitive     * 40,000 COLONIES/mL ENTEROCOCCUS FAECALIS  MRSA PCR Screening     Status: Abnormal   Collection Time: 11/12/2019  8:54 PM   Specimen: Nasopharyngeal  Result Value Ref Range Status   MRSA by PCR POSITIVE (A) NEGATIVE Final    Comment:        The GeneXpert MRSA Assay (FDA approved for NASAL specimens only), is one component of a comprehensive MRSA colonization surveillance program. It is not intended to diagnose MRSA infection nor to guide or monitor treatment for MRSA infections. RESULT CALLED TO, READ BACK BY AND VERIFIED WITH: A,ROTHERMEL AT 2311 ON 10/29/2019 BY A,MOHAMED Performed at [facility] [address], [place], [place] [address]   SARS CORONAVIRUS 2 (TAT 6-24 HRS) Nasopharyngeal Nasopharyngeal Swab     Status: None   Collection Time: 10/25/2019  9:17 PM   Specimen: Nasopharyngeal Swab  Result Value Ref Range Status   SARS Coronavirus 2 NEGATIVE NEGATIVE Final    Comment: (NOTE) SARS-CoV-2 target nucleic acids are NOT DETECTED. The SARS-CoV-2 RNA is generally detectable in upper and lower respiratory specimens during the acute phase of infection. Negative results do not preclude SARS-CoV-2 infection, do not rule out co-infections with other pathogens, and should not be used as the sole basis for treatment or other patient management decisions. Negative results must be combined with clinical observations, patient history, and epidemiological information. The expected result is Negative. Fact Sheet for Patients: SugarRoll.be Fact Sheet for Healthcare Providers: https://www.woods-mathews.com/ This test is not yet approved or cleared by the [place] FDA and  has been authorized for detection and/or diagnosis of SARS-CoV-2 by FDA under an Emergency Use Authorization (EUA). This EUA will remain  in effect  (meaning this test can be used) for the duration of the COVID-19 declaration under Section 56 4(b)(1) of the Act, 21 U.S.C. section 360bbb-3(b)(1), unless the authorization is terminated or revoked sooner. Performed at [facility], [place] [address], [place],  [address]          Radiology Studies: No results found.      Scheduled Meds: . chlorhexidine gluconate (MEDLINE KIT)  15 mL Mouth Rinse BID  . Chlorhexidine Gluconate Cloth  6 each Topical Daily  . feeding supplement (PRO-STAT SUGAR FREE 64)  30 mL Per Tube BID  . free water  150 mL Per Tube Q4H  . heparin  5,000 Units Subcutaneous Q8H  . [START ON 11/17/2019] hydrocortisone sod succinate (SOLU-CORTEF) inj  50 mg Intravenous Daily  . insulin aspart  0-9 Units Subcutaneous Q4H  . mouth rinse  15 mL Mouth Rinse Q4H  . midodrine  10 mg Per Tube TID WC  . mupirocin ointment   Nasal BID  .  pantoprazole sodium  40 mg Per Tube Daily  . sodium chloride flush  10-40 mL Intracatheter Q12H   Continuous Infusions: . sodium chloride Stopped (11/15/19 1050)  . ceFEPime (MAXIPIME) IV    . feeding supplement (OSMOLITE 1.5 CAL) 50 mL/hr at 11/15/19 0600  . metronidazole 500 mg (11/16/19 0542)  . [START ON 11/17/2019] vancomycin       LOS: 5 days    Time spent: 38 minutes spent on chart review, discussion with nursing staff, consultants, updating family and interview/physical exam; more than 50% of that time was spent in counseling and/or coordination of care.    Luisfernando Brightwell J [place] DO [facility] Available via Epic secure chat 7am-7pm After these hours, please refer to coverage provider listed on [email or web address] 11/16/2019, 11:52 AM

## 2019-11-16 NOTE — Progress Notes (Signed)
Pharmacy Antibiotic Note  Barbara Flowers is a 69 y.o. female admitted on 11/12/2019 with sepsis.  Pharmacy has been consulted for vancomycin dosing.  Plan: Redose vancomycin 750mg  iv x1 now  Height: 5\' 8"  (172.7 cm) Weight: 127 lb 3.3 oz (57.7 kg) IBW/kg (Calculated) : 63.9  Temp (24hrs), Avg:98 F (36.7 C), Min:97.3 F (36.3 C), Max:98.4 F (36.9 C)  Recent Labs  Lab 11/01/2019 1151 11/14/2019 1740 11/15/2019 1800 11/19/2019 1800 11/06/2019 2231 11/12/19 0245 11/12/19 0245 11/13/19 0531 11/13/19 0531 11/13/19 0609 11/13/19 1207 11/13/19 1600 11/14/19 0215 11/15/19 0251 11/16/19 0411  WBC   < >  --  28.0*   < >  --  33.7*  --   --   --  27.6*  --   --  23.9* 20.0* 32.3*  CREATININE   < > 3.54* 3.34*   < >  --  2.71*   < > 1.66*  --   --   --  1.30* 1.12* 0.85 0.68  LATICACIDVEN  --  2.1* 2.8*  --  3.6*  --   --  2.7*  --   --  1.9  --   --   --   --   VANCORANDOM  --   --   --   --   --   --   --  10   < >  --   --   --   --  14 19   < > = values in this interval not displayed.    Estimated Creatinine Clearance: 60.5 mL/min (by C-G formula based on SCr of 0.68 mg/dL).    Allergies  Allergen Reactions  . Gabapentin Other (See Comments)    seizure  . Tramadol Other (See Comments)    Brings on Seizures     Antimicrobials this admission: 2/20 vanc >> 2/20 cefepime >> 2/20 flagyl >>   Dose adjustments this admission: 2/22 at 0531 VR= 10 (38 hrs after 1250 mg dose) 2/24 @ 0251 VR = 14 (last dose 1250mg  iv x1 2/22 at 1247) 2/25 @0411  VR = 19 prior dose vancomycin 1gm iv x1 2/24 at 0636--> redose 750mg  iv x1 2/25    Microbiology results: Previous Cx:  09/27/19 sputum: MRSA 09/16/19 blood: proteus, CNS  2/20 BCx x2:  2/20 UCx Cath (PTA foley):<10K insignif growth FINAL 2/20 ucx: 40K enterococcus faecalis (pans sens) FINAL 2/20 MRSA PCR: positive   Thank you for allowing pharmacy to be a part of this patient's care.  Nani Skillern Crowford 11/16/2019 5:27 AM

## 2019-11-16 NOTE — Progress Notes (Signed)
Pharmacy Antibiotic Note  Barbara Flowers is a 69 y.o. female admitted on 11/02/2019 with sepsis.  Pharmacy has been consulted for vancomycin dosing.  Today, 11/16/19  Day 6 abx's - vanc/cefepime/flagyl  WBC rising (tapering steroids)  Afebrile  SCr 0.68 CrCl 61  Plan:  Will change cefepime from 2g q12 to 2g q8 per current renal function  Based on current Scr and random vanc levels, will restart vanc at a dose of 1g IV q24 - goal AUC 400-550  What is plan for abx LOT since we are currently at Day #6?  Height: 5\' 8"  (172.7 cm) Weight: 127 lb 3.3 oz (57.7 kg) IBW/kg (Calculated) : 63.9  Temp (24hrs), Avg:98.5 F (36.9 C), Min:97.8 F (36.6 C), Max:99.4 F (37.4 C)  Recent Labs  Lab 11/16/2019 1151 11/12/2019 1740 11/02/2019 1800 11/16/2019 1800 11/06/2019 2231 11/12/19 0245 11/12/19 0245 11/13/19 0531 11/13/19 0531 11/13/19 0609 11/13/19 1207 11/13/19 1600 11/14/19 0215 11/15/19 0251 11/16/19 0411  WBC   < >  --  28.0*   < >  --  33.7*  --   --   --  27.6*  --   --  23.9* 20.0* 32.3*  CREATININE   < > 3.54* 3.34*   < >  --  2.71*   < > 1.66*  --   --   --  1.30* 1.12* 0.85 0.68  LATICACIDVEN  --  2.1* 2.8*  --  3.6*  --   --  2.7*  --   --  1.9  --   --   --   --   VANCORANDOM  --   --   --   --   --   --   --  10   < >  --   --   --   --  14 19   < > = values in this interval not displayed.    Estimated Creatinine Clearance: 60.5 mL/min (by C-G formula based on SCr of 0.68 mg/dL).    Allergies  Allergen Reactions  . Gabapentin Other (See Comments)    seizure  . Tramadol Other (See Comments)    Brings on Seizures     Antimicrobials this admission: 2/20 vanc >> 2/20 cefepime >> 2/20 flagyl >>   Dose adjustments this admission: 2/22 at 0531 VR= 10 (38 hrs after 1250 mg dose) 2/24 @ 0251 VR = 14 (last dose 1250mg  iv x1 2/22 at 1247) 2/25 @0411  VR = 19 prior dose vancomycin 1gm iv x1 2/24 at 0636--> redose 750mg  iv x1 2/25    Microbiology results: Previous  Cx:  09/27/19 sputum: MRSA 09/16/19 blood: proteus, CNS  2/20 BCx x2:  2/20 UCx Cath (PTA foley):<10K insignif growth FINAL 2/20 ucx: 40K enterococcus faecalis (pans sens) FINAL 2/20 MRSA PCR: positive   Thank you for allowing pharmacy to be a part of this patient's care.  Kara Mead 11/16/2019 9:46 AM

## 2019-11-16 NOTE — Care Management Important Message (Signed)
Important Message  Patient Details IM Letter given to Gabriel Earing RN Case Manager to present to the Patient Name: Khristal Baydoun MRN: [id number] Date of Birth: Sep 04, 1951   Medicare Important Message Given:  Yes     Kerin Salen 11/16/2019, 2:24 PM

## 2019-11-16 NOTE — Progress Notes (Signed)
Palliative care brief progress note  I checked in on Barbara Flowers today.  She did not arouse during encounter.    Her husband is at the bedside.  Family remains invested in plan for continuation of full aggressive care. He has no questions at this point and plan is to work to transition home once she completes IV antibiotics.    Patient's husband has palliative contact number and will reach out to palliative service here at the hospital if he would like further follow-up from our team.  Barbara Rough, MD [facility] Team [phone]  NO CHARGE NOTE

## 2019-11-17 ENCOUNTER — Inpatient Hospital Stay (HOSPITAL_COMMUNITY): Payer: Medicare Other | Admitting: Certified Registered Nurse Anesthetist

## 2019-11-17 ENCOUNTER — Inpatient Hospital Stay (HOSPITAL_COMMUNITY): Payer: Medicare Other

## 2019-11-17 ENCOUNTER — Ambulatory Visit: Payer: Medicare Other | Admitting: Cardiovascular Disease

## 2019-11-17 DIAGNOSIS — R0603 Acute respiratory distress: Secondary | ICD-10-CM

## 2019-11-17 DIAGNOSIS — J9600 Acute respiratory failure, unspecified whether with hypoxia or hypercapnia: Secondary | ICD-10-CM

## 2019-11-17 LAB — BLOOD GAS, ARTERIAL
Acid-Base Excess: 1.7 mmol/L (ref 0.0–2.0)
Acid-Base Excess: 0.5 mmol/L (ref 0.0–2.0)
Bicarbonate: 24.3 mmol/L (ref 20.0–28.0)
Bicarbonate: 22.7 mmol/L (ref 20.0–28.0)
Drawn by: 25788
FIO2: 100
FIO2: 40
MECHVT: 510 mL
O2 Saturation: 85.3 %
O2 Saturation: 94 %
Patient temperature: 98.6
Patient temperature: 98.4
RATE: 20 resp/min
pCO2 arterial: 31.8 mmHg — ABNORMAL LOW (ref 32.0–48.0)
pCO2 arterial: 29 mmHg — ABNORMAL LOW (ref 32.0–48.0)
pH, Arterial: 7.495 — ABNORMAL HIGH (ref 7.350–7.450)
pH, Arterial: 7.504 — ABNORMAL HIGH (ref 7.350–7.450)
pO2, Arterial: 48.5 mmHg — ABNORMAL LOW (ref 83.0–108.0)
pO2, Arterial: 67.7 mmHg — ABNORMAL LOW (ref 83.0–108.0)

## 2019-11-17 LAB — CBC
HCT: 30.8 % — ABNORMAL LOW (ref 36.0–46.0)
Hemoglobin: 9.3 g/dL — ABNORMAL LOW (ref 12.0–15.0)
MCH: 28.2 pg (ref 26.0–34.0)
MCHC: 30.2 g/dL (ref 30.0–36.0)
MCV: 93.3 fL (ref 80.0–100.0)
Platelets: 221 10*3/uL (ref 150–400)
RBC: 3.3 MIL/uL — ABNORMAL LOW (ref 3.87–5.11)
RDW: 18.2 % — ABNORMAL HIGH (ref 11.5–15.5)
WBC: 45.3 10*3/uL — ABNORMAL HIGH (ref 4.0–10.5)
nRBC: 1.2 % — ABNORMAL HIGH (ref 0.0–0.2)

## 2019-11-17 LAB — BASIC METABOLIC PANEL
Anion gap: 8 (ref 5–15)
BUN: 68 mg/dL — ABNORMAL HIGH (ref 8–23)
CO2: 23 mmol/L (ref 22–32)
Calcium: 7.3 mg/dL — ABNORMAL LOW (ref 8.9–10.3)
Chloride: 115 mmol/L — ABNORMAL HIGH (ref 98–111)
Creatinine, Ser: 0.67 mg/dL (ref 0.44–1.00)
GFR calc Af Amer: >60 mL/min (ref >60)
GFR calc non Af Amer: >60 mL/min (ref >60)
Glucose, Bld: 123 mg/dL — ABNORMAL HIGH (ref 70–99)
Potassium: 3.2 mmol/L — ABNORMAL LOW (ref 3.5–5.1)
Sodium: 146 mmol/L — ABNORMAL HIGH (ref 135–145)

## 2019-11-17 LAB — GLUCOSE, CAPILLARY
Glucose-Capillary: 125 mg/dL — ABNORMAL HIGH (ref 70–99)
Glucose-Capillary: 137 mg/dL — ABNORMAL HIGH (ref 70–99)
Glucose-Capillary: 92 mg/dL (ref 70–99)
Glucose-Capillary: 132 mg/dL — ABNORMAL HIGH (ref 70–99)
Glucose-Capillary: 124 mg/dL — ABNORMAL HIGH (ref 70–99)
Glucose-Capillary: 129 mg/dL — ABNORMAL HIGH (ref 70–99)

## 2019-11-17 LAB — LACTIC ACID, PLASMA: Lactic Acid, Venous: 1.7 mmol/L (ref 0.5–1.9)

## 2019-11-17 LAB — TROPONIN I (HIGH SENSITIVITY): Troponin I (High Sensitivity): 26 ng/L — ABNORMAL HIGH (ref <18)

## 2019-11-17 MED ORDER — MIDAZOLAM HCL 2 MG/2ML IJ SOLN: 2.0000 mg | INTRAMUSCULAR | Status: DC | PRN

## 2019-11-17 MED ORDER — FENTANYL CITRATE (PF) 100 MCG/2ML IJ SOLN
50.0000 ug | INTRAMUSCULAR | Status: DC | PRN
  Administered 2019-11-17 (×2): 50 ug via INTRAVENOUS
  Filled 2019-11-17: qty 2

## 2019-11-17 MED ORDER — CHLORHEXIDINE GLUCONATE 0.12% ORAL RINSE (MEDLINE KIT)
15.0000 mL | Freq: Two times a day (BID) | OROMUCOSAL | Status: DC
  Administered 2019-11-17 – 2019-11-26 (×19): 15 mL via OROMUCOSAL

## 2019-11-17 MED ORDER — POTASSIUM CHLORIDE 20 MEQ/15ML (10%) PO SOLN
40.0000 meq | ORAL | Status: AC
  Administered 2019-11-17 (×2): 40 meq
  Filled 2019-11-17 (×2): qty 30

## 2019-11-17 MED ORDER — ORAL CARE MOUTH RINSE
15.0000 mL | OROMUCOSAL | Status: DC
  Administered 2019-11-17 – 2019-11-26 (×85): 15 mL via OROMUCOSAL

## 2019-11-17 MED ORDER — NOREPINEPHRINE 4 MG/250ML-% IV SOLN
0.0000 ug/min | INTRAVENOUS | Status: DC
  Administered 2019-11-17: 2 ug/min via INTRAVENOUS

## 2019-11-17 MED ORDER — MAGNESIUM SULFATE 2 GM/50ML IV SOLN
2.0000 g | Freq: Once | INTRAVENOUS | Status: AC
  Administered 2019-11-17: 2 g via INTRAVENOUS
  Filled 2019-11-17: qty 50

## 2019-11-17 MED ORDER — VASOPRESSIN 20 UNIT/ML IV SOLN
0.0300 [IU]/min | INTRAVENOUS | Status: DC
  Filled 2019-11-17: qty 2

## 2019-11-17 MED ORDER — PRO-STAT SUGAR FREE PO LIQD
30.0000 mL | Freq: Every day | ORAL | Status: DC
  Administered 2019-11-18 – 2019-11-26 (×9): 30 mL
  Filled 2019-11-17 (×9): qty 30

## 2019-11-17 MED ORDER — NOREPINEPHRINE 4 MG/250ML-% IV SOLN
INTRAVENOUS | Status: AC
  Filled 2019-11-17: qty 250

## 2019-11-17 MED ORDER — LACTATED RINGERS IV BOLUS
500.0000 mL | Freq: Once | INTRAVENOUS | Status: AC
  Administered 2019-11-17: 500 mL via INTRAVENOUS

## 2019-11-17 MED ORDER — FENTANYL CITRATE (PF) 100 MCG/2ML IJ SOLN: 50.0000 ug | INTRAMUSCULAR | Status: DC | PRN

## 2019-11-17 MED ORDER — VITAL HIGH PROTEIN PO LIQD
1000.0000 mL | ORAL | Status: DC
  Administered 2019-11-17 – 2019-11-25 (×8): 1000 mL

## 2019-11-17 MED ORDER — EPINEPHRINE 1 MG/10ML IJ SOSY
PREFILLED_SYRINGE | INTRAMUSCULAR | Status: AC
  Filled 2019-11-17: qty 10

## 2019-11-17 MED ORDER — LACTATED RINGERS IV BOLUS: 250.0000 mL | Freq: Once | INTRAVENOUS | Status: AC

## 2019-11-17 MED ORDER — JUVEN PO PACK
1.0000 | PACK | Freq: Two times a day (BID) | ORAL | Status: DC
  Administered 2019-11-17 – 2019-11-26 (×18): 1
  Filled 2019-11-17 (×18): qty 1

## 2019-11-17 NOTE — Progress Notes (Signed)
Daily Progress Note   Patient Name: Barbara Flowers       Date: 11/17/2019 DOB: [birth year]  Age: 69 y.o. MRN#: [id number] Attending Physician: Brand Males, MD Primary Care Physician: [name], NP Admit Date: 11/08/2019  Reason for Consultation/Follow-up: Establishing goals of care  Subjective: Met today with patient's husband in conjunction with Merlene Laughter from [facility].  Patient's son, Dr. Elmarie Mainland, joined [place] via facetime as well.  We reviewed clinical course including decompensation with right lung collapse necessitating intubation.  Discussed her overall clinical status, including concern for mental status with the fact she is intubated but not requiring sedation and reviewed pre and post intubation imaging with her son.   Her husband reports that they are understanding medical team's concern that she is not going to survive this hospitalization.  His son is working to come into town late next Friday, and family desires to maintain plan for aggressive interventions until family has the opportunity to see her next weekend.  At that time, her husband reports plan will likely be to transition to a comfort approach.  Brought up limits of care between now and next week.  Family to discuss, but clear in desire for all medical interventions.  They will decide regarding CPR in the event of cardiac arrest and let us know.    Length of Stay: 6  Current Medications: Scheduled Meds:  . chlorhexidine gluconate (MEDLINE KIT)  15 mL Mouth Rinse BID  . Chlorhexidine Gluconate Cloth  6 each Topical Daily  . EPINEPHrine      . [START ON 11/18/2019] feeding supplement (PRO-STAT SUGAR FREE 64)  30 mL Per Tube Daily  . free water  150 mL Per Tube Q4H  . heparin  5,000 Units Subcutaneous Q8H    . insulin aspart  0-9 Units Subcutaneous Q4H  . mouth rinse  15 mL Mouth Rinse 10 times per day  . midodrine  10 mg Per Tube TID WC  . mupirocin ointment   Nasal BID  . nutrition supplement (JUVEN)  1 packet Per Tube BID BM  . pantoprazole sodium  40 mg Per Tube Daily  . potassium chloride  40 mEq Per Tube Q4H  . sodium chloride flush  10-40 mL Intracatheter Q12H    Continuous Infusions: . sodium chloride Stopped (11/15/19 1050)  . ceFEPime (  MAXIPIME) IV Stopped (11/17/19 1055)  . feeding supplement (VITAL HIGH PROTEIN)    . metronidazole 500 mg (11/17/19 0541)  . vancomycin Stopped (11/17/19 0925)    PRN Meds: acetaminophen (TYLENOL) oral liquid 160 mg/5 mL, fentaNYL (SUBLIMAZE) injection, fentaNYL (SUBLIMAZE) injection, fentaNYL (SUBLIMAZE) injection, lip balm, midazolam, midazolam, sodium chloride flush  Physical Exam         Unresponsive. Intubated Has PEG tube No edema  Vital Signs: BP (!) 120/58 (BP Location: Left Leg)   Pulse 89   Temp 98.6 F (37 C) (Oral)   Resp 19   Ht 5' 8"  (1.727 m)   Wt 70.2 kg   SpO2 96%   BMI 23.53 kg/m  SpO2: SpO2: 96 % O2 Device: O2 Device: Ventilator O2 Flow Rate: O2 Flow Rate (L/min): 100 L/min  Intake/output summary:   Intake/Output Summary (Last 24 hours) at 11/17/2019 1429 Last data filed at 11/17/2019 5597 Gross per 24 hour  Intake 1359.43 ml  Output 1300 ml  Net 59.43 ml   LBM: Last BM Date: 11/17/19 Baseline Weight: Weight: 57.5 kg Most recent weight: Weight: 70.2 kg       Palliative Assessment/Data:    Flowsheet Rows     Most Recent Value  Intake Tab  Referral Department  Critical care  Unit at Time of Referral  ICU  Palliative Care Primary Diagnosis  Sepsis/Infectious Disease  Date Notified  11/12/19  Palliative Care Type  Return patient Palliative Care  Reason for referral  Clarify Goals of Care  Date of Admission  11/17/2019  Date first seen by Palliative Care  11/12/19  # of days Palliative referral  response time  0 Day(s)  # of days IP prior to Palliative referral  1  Clinical Assessment  Psychosocial & Spiritual Assessment  Palliative Care Outcomes      Patient Active Problem List   Diagnosis Date Noted  . Acute respiratory distress   . Enterococcus faecalis infection 11/16/2019  . Septic shock ([place]) 10/27/2019  . Left spastic hemiparesis with h/o strokes 10/19/2019  . Malnutrition of moderate degree 10/19/2019  . Decreased functional mobility 10/17/2019  . Sacral decubitus ulcer, stage IV ([place]) 10/17/2019  . 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 Dysphagia 10/17/2019  . Kyphoscoliosis 10/17/2019  . H/O bone marrow transplant ([place])   . Palliative care by specialist   . Goals of care, counseling/discussion   . Generalized weakness   . Failure to thrive in adult   . Acute respiratory failure ([place])   . Pleural effusion   . Acute systolic heart failure ([place])   . Protein-calorie malnutrition, severe ([place]) 09/24/2019  . Pressure injury of skin 09/17/2019  . CAP (community acquired pneumonia) 09/16/2019  . Pleural effusion on left 09/16/2019  . Acute prerenal azotemia 09/16/2019  . Hypernatremia 09/16/2019  . Altered mental status, unspecified 07/12/2017  . Diarrhea 05/24/2017  . Avascular necrosis of bone of hip, right ([place]) 11/23/2016  . Memory difficulty 11/23/2016  . Hallucination, visual 11/23/2016  . Cervical myelopathy ([place]) 06/13/2016  . Hypokalemia 06/12/2016  . 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 Parkinson's disease ([place]) 06/11/2016  . GI bleed 06/09/2016  . Chronic right hip pain 01/10/2016  . Physical debility 01/10/2016  . Wheelchair dependent 2017- 10/17/2015  . TIA (transient ischemic attack) 08/31/2015  . History of CVA with residual deficit left side 08/31/2015  . Hypothyroidism 08/31/2015  . OSA on CPAP 08/31/2015  . Dementia ([place]) 08/31/2015  . Chronic orthostatic hypotension 08/31/2015  .  OAB (overactive bladder) 08/31/2015  .  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  . Peripheral neuropathy 08/31/2015  . Avascular necrosis of right femoral head ([place]) 08/31/2015  . Left carotid bruit 08/31/2015  . Anemia 08/31/2015    Palliative Care Assessment & Plan   Patient Profile:    Assessment: 69 year old lady known to palliative medicine service and a previous hospitalization, admitted with septic shock secondary to urinary tract infection from indwelling Foley, also has sacral wound. Acute kidney injury hyperkalemia from septic shock as well as urinary retention Chronic hypotension autonomic dysfunction Chronic heart failure and paroxysmal atrial fibrillation Recurrent aspiration chronic at least moderate protein calorie malnutrition has feeding tube via PEG   Recommendations/Plan:  Continue current mode of care.  Full code/full scope care for now.  Family planning to come to town next weekend (son to arrive late Friday) and shift may be toward more of a comfort based approach at that point.   Family to discuss regarding code status.  Consideration for limit of care may be no CPR in the event of cardiac arrest.  Her son is a neurosurgery resident and her daughter is an NP.  They understand situation and will let care team know if they desire to put any limits of care in place.  Palliative to follow.  Code Status:    Code Status Orders  (From admission, onward)         Start     Ordered   11/14/2019 1759  Full code  Continuous     10/27/2019 1759        Code Status History    Date Active Date Inactive Code Status Order ID Comments User Context   09/16/2019 1740 10/21/2019 2017 Full Code [id number]  Tanda Rockers, MD ED   05/24/2017 2004 05/28/2017 2035 Full Code [id number]  Etta Quill, DO ED   08/31/2015 1641 09/02/2015 2016 Full Code [id number]  Samella Parr, NP Inpatient   Advance Care Planning Activity    Advance Directive Documentation     Most Recent Value  Type of Advance Directive   Healthcare Power of [place], Living will  Pre-existing out of facility DNR order (yellow form or pink MOST form)  --  "MOST" Form in Place?  --       Prognosis:   guarded   Discharge Planning:  To Be Determined  Care plan was discussed with  Inter disciplinary team, husband outside the room.   Thank you for allowing the Palliative Medicine Team to assist in the care of this patient.   Time In: 12.40 Time Out: 1320 Total Time 40 Prolonged Time Billed No       Greater than 50%  of this time was spent counseling and coordinating care related to the above assessment and plan.  Micheline Rough, MD  Please contact Palliative Medicine Team phone at [phone] for questions and concerns.

## 2019-11-17 NOTE — Progress Notes (Signed)
Patient on RA, noted with  low 02 sat. 3L oxygen applied and 02 sat was in the 70s and 80s. Non rebreather mask applied and  RRT called. Patient transfered to step down at this time due to low P02 .

## 2019-11-17 NOTE — Progress Notes (Signed)
eLink Physician-Brief Progress Note Patient Name: Barbara Flowers DOB: 1951/06/11 MRN: [id number]   Date of Service  11/17/2019  HPI/Events of Note  Pt transferred to the ICU from the floor due to acute hypoxemic respiratory failure related to left lung collapse and pneumonia. She was briefly hypotensive as well.  eICU Interventions  Pt intubated by anesthesia, affected side up respiration, aggressive bagging with a PEEP valved ambu bag, PCCM  Boots on the ground requested to see her in consultation. Levophed infusion started for hypotension,        [name] 11/17/2019, 5:39 AM

## 2019-11-17 NOTE — Progress Notes (Signed)
Paged by RR RN with concerns for worsening hypoxia. Pt sats hovering in the 70-80's on a nonrebreather. Pt placed on bipap and transferred to SDU. At 0400, I was repaged. Pt continues to decline despite being on bipap. Unable to get saturation above 80%. Discussed POC with son, daughter and spouse. Family insists at this time pt remain a full code and be intubated. PCCM consulted. Pt to be intubated at bedside.   Hypoxemia/ Acute respiratory failure.  - ABG stat - Chest xray stat - Transfer to SDU/ICU - PCCM consulted - Discussed patient's plan of care and status with son, daughter, and spouse.   CRITICAL CARE Performed by: Neila Gear   Total critical care time: 30 minutes  Critical care time was exclusive of separately billable procedures and treating other patients.  Critical care was necessary to treat or prevent imminent or life-threatening deterioration.  Critical care was time spent personally by me on the following activities: development of treatment plan with patient and/or surrogate as well as nursing, discussions with consultants, evaluation of patient's response to treatment, examination of patient, obtaining history from patient or surrogate, ordering and performing treatments and interventions, ordering and review of laboratory studies, ordering and review of radiographic studies, pulse oximetry and re-evaluation of patient's condition.

## 2019-11-17 NOTE — Anesthesia Procedure Notes (Signed)
Procedure Name: Intubation Date/Time: 11/17/2019 4:21 AM Performed by: Claudia Desanctis, CRNA Pre-anesthesia Checklist: Patient identified, Emergency Drugs available, Suction available and Patient being monitored Patient Re-evaluated:Patient Re-evaluated prior to induction Oxygen Delivery Method: Non-rebreather mask Preoxygenation: Pre-oxygenation with 100% oxygen Laryngoscope Size: 2 and Miller Grade View: Grade I Tube type: Oral Tube size: 7.5 mm Number of attempts: 1 Airway Equipment and Method: Stylet Placement Confirmation: ETT inserted through vocal cords under direct vision,  positive ETCO2 and breath sounds checked- equal and bilateral Secured at: 21 cm Tube secured with: Tape Dental Injury: Teeth and Oropharynx as per pre-operative assessment  Comments: Pt obtunded, zero response to stimulus, intubated without difficulty with zero medications.    ICU md on video monitor to manage post vent

## 2019-11-17 NOTE — Progress Notes (Signed)
Nutrition Follow-up  DOCUMENTATION CODES:   Not applicable  INTERVENTION:  - will adjust TF regimen: Vital High Protein @ 50 ml/hr with 30 ml prostat once/day and juven BID. - this regimen will provide 1490 kcal (105% estimated kcal need), 125 grams protein, and 1003 ml free water.    NUTRITION DIAGNOSIS:   Inadequate oral intake related to inability to eat as evidenced by NPO status. -ongoing  GOAL:   Patient will meet greater than or equal to 90% of their needs -unmet with current TF regimen  MONITOR:   Vent status, TF tolerance, Labs, Weight trends, Skin  REASON FOR ASSESSMENT:   Ventilator  ASSESSMENT:   69 year old with history of Breast cancer status postmastectomy (1990's), brain aneurysm, dementia, CVA with left hemiplegia, cardiac contractures, stage IV sacral decub, Shy-Drager syndrome, LV thrombus, atrial fibrillation/flutter, recurrent aspiration with PEG, with recent admission from 12/26 to 10/21/2019 for respiratory failure, MRSA pneumonia, CHF. Presents with septic shock from indwelling Foley (which was replaced), urosepsis with AKI, hyperkalemia.  Patient intubated by CRNA today at [facility]. PEG remains in place. Patient's husband is at bedside.   Able to talk with RN outside of the room about plans for today and about TF. Patient is currently receiving Osmolite 1.5 @ 30 ml/hr with goal rate of 50 ml/hr with 30 ml prostat BID and 150 ml free water every 4 hours. At goal rate, this regimen provides 2000 kcal, 105 grams protein, and 1814 ml free water.   Weight up significantly from 2/24-2/26. Weight was stable 2/20-2/24 so used weight from 2/24 (57.7 kg) to re-estimate needs. Patient with mild to moderate edema to all extremities.   Per notes: - acute hypoxic respiratory failure s/p intubation this AM - recurrent aspiration - septic shock 2/2 UTI--improving - acute metabolic encephalopathy with hx of underlying dementia - severe debility  - stage 4 sacral pressure  injury - chronic protein malnutrition - severe dysphagia s/p PEG - family meeting for [place] this afternoon    Patient is currently intubated on ventilator support MV: 10.8 L/min Temp (24hrs), Avg:98.4 F (36.9 C), Min:98.1 F (36.7 C), Max:98.6 F (37 C) Propofol: none BP: 156/67 and MAP: 93   Labs reviewed; CBGs: 125, 137, and 92 mg/dl, Na: 126 mmol/l, K: 3.2 mmol/l, Cl: 115 mmol/l, BUN: 68 mg/dl, Ca: 7.3 mg/dl. Medications reviewed; 40 mg IV lasix x1 dose 2/25, sliding scale novolog, 50 mg solu-cortef x1 dose 2/26, 2 g IV Mg sulfate x1 run 2/26, 40 mEq KCl per tube x2 doses 2/26.     NUTRITION - FOCUSED PHYSICAL EXAM:  completed; no muscle and no fat wasting.   Diet Order:   Diet Order            Diet NPO time specified Except for: Ice Chips  Diet effective now              EDUCATION NEEDS:   No education needs have been identified at this time  Skin:  Skin Assessment: Skin Integrity Issues: Skin Integrity Issues:: Unstageable, Stage IV, Stage II Stage II: L thigh; L ear Stage IV: sacrum Unstageable: full thickness L foot  Last BM:  2/26  Height:   Ht Readings from Last 1 Encounters:  11/05/2019 5\' 8"  (1.727 m)    Weight:   Wt Readings from Last 1 Encounters:  11/17/19 70.2 kg    Ideal Body Weight:  63.6 kg  BMI:  Body mass index is 23.53 kg/m.  Estimated Nutritional Needs:   Kcal:  1411 kcal  Protein:  115-144 grams (2-2.5 grams/kg)  Fluid:  >/= 2 L/day     Jarome Matin, MS, RD, LDN, CNSC Inpatient Clinical Dietitian RD pager # available in AMION  After hours/weekend pager # available in [facility]

## 2019-11-17 NOTE — Progress Notes (Signed)
PCCM progress note  Goals of care discussion held with patient's spouse at bedside as well as son via FaceTime this afternoon. Palliative medicine team was present as well.  Family was updated regarding patient's very poor prognosis.  Patient appears to be at end-of-life little with no medical interventions that will provide a meaningful recovery and/or meaningful life.  Family would like to continue with current treatment until all family members can be a bedside, tentatively planned for next Saturday 3/6 in order to transition to a comfort approach. Patients spouse stated he would like to discuss change of code status with his daughter prior to any decision being made.   Johnsie Cancel, NP-C [facility] Contact / Pager information can be found on Amion  11/17/2019, 2:58 PM

## 2019-11-17 NOTE — Progress Notes (Signed)
NAME:  Barbara Flowers, MRN:  [id number], DOB:  [birth year], LOS: 6 ADMISSION DATE:  11/17/2019, CONSULTATION DATE:  11/17/2019 REFERRING MD: Gardenia Phlegm MD, CHIEF COMPLAINT: Septic shock  Brief History   69 year old with complicated history as noted below with recent admission from 12/26 to 10/21/2019 for respiratory failure, MRSA pneumonia, CHF.  Returns to hospital with low urine output, altered mental status. She was started on levaquin as an outpatient for UTI but continued to worsen. In ED she has septic shock from indwelling blocked Foley (which was replaced), urosepsis with AKI, hyperkalemia.     Past Medical History  Breast cancer status postmastectomy, brain aneurysm, dementia, CVA with left hemiplegia, cardiac contractures, stage IV sacral decub, Shy-Drager syndrome, LV thrombus, paroxysmal atrial fibrillation/flutter   has a past surgical history that includes Lymph node biopsy; Hand surgery (Left, 2015); Gastric bypass (1985); Cholecystectomy; Craniotomy for hemispherectomy total / partial (Right, 2010); Breast lumpectomy (Left, 1991); Hand tendon surgery (Left); Bone marrow transplant (1991); Laparoscopic gastrostomy (N/A, 10/18/2019); Laparoscopic lysis of adhesions (N/A, 10/18/2019); and Incisional hernia repair (N/A, 10/18/2019).   Significant Hospital Events   2/20- Admit amount peripheral levo. Urine culture enterococcus. Renal US - no hyror 2/21-rapid A. fib-given amnio bolus 2.22 - Rt midline cath  2/23 - Off pressors.  Some moaning in pain per RN.  Scr slightly improved.  -0> CCM signed off with recommendation for hospice  Consults:  PCCM, Palliative  Procedures:    Significant Diagnostic Tests:  Renal ultrasound 2/20-no hydronephrosis   Micro Data:  Blood cultures 2/20- Urine Cultures 2/20- enterococcus  MRSA PCR 2/20 > Positive   Antimicrobials:  Flagyl 2/21 >> Vanco 2/21 >> Cefepime 2/21 >>  Interim history/subjective:    11/17/2019  - rushed emergently  to ICU from floor. Comatose and rresp distress and hypoxemic. CXR with L sided collapse. Elink called CRNA and patient intubated without any RSI medications. Post  Intubation CXR shows resolution of collapse. BP ok. CCM recalled. RN reporting sacral decub. Maintains natural BP  Palliative care team on 2/25 -> full code/patient unresponsive  Objective   Blood pressure (!) 88/62, pulse (!) 107, temperature 98.4 F (36.9 C), temperature source Oral, resp. rate (!) 42, height 5\' 8"  (1.727 m), weight 70.2 kg, SpO2 (!) 74 %.        Intake/Output Summary (Last 24 hours) at 11/17/2019 0446 Last data filed at 11/17/2019 0216 Gross per 24 hour  Intake 609.3 ml  Output 1701 ml  Net -1091.7 ml   Filed Weights   11/14/19 0500 11/15/19 0405 11/17/19 0348  Weight: 57.7 kg 57.7 kg 70.2 kg    General Appearance:  Looks criticall and frail Head:  Normocephalic, without obvious abnormality, atraumatic Eyes:  PERRL - yes, conjunctiva/corneas - muddy     Ears:  Normal external ear canals, both ears Nose:  G tube - no Throat:  ETT TUBE - yes , OG tube - no Neck:  Supple,  No enlargement/tenderness/nodules Lungs: Clear to auscultation bilaterally, Ventilator   Synchrony - yes but tachypenic. LEFT CHEST INDURATED Heart:  S1 and S2 normal, no murmur, CVP - no.  Pressors - no Abdomen:  Soft, no masses, no organomegaly Genitalia / Rectal:  Not done Extremities:  Extremities- intact but wth edema in uppers Skin:  ntact in exposed areas . Sacral area - decube per RN Neurologic:  Sedation - none -> RASS - -4       Resolved Hospital Problem list     Assessment & Plan:  Septic shock secondary to UTI from indwelling Foley's and Question Component of Infection secondary to sacral wound  Plan  -Continue gentle IV fluids -wean stress steroids  -Continue vancomycin, cefepime, Flagyl  - continue home midodrine   AKI, hyperkalemia secondary to septic shock and urinary retention >  improving Hypernatremia  Plan  -Nephrology Following  -Trend BMP  -continue Free Water 150 q4h    Plan  -Midodrine per tube  1  Plan -Cardiac Monitoring  - remains off amiodarone   Recurrent aspiration, chronic protein energy malnutrition Plan -Feeding tubes via PEG tube -Nutrition following   ASSESSMENT / PLAN:   A:  Acute respiratory failure - requiring intubation 11/17/19 - likely due to mucus pluggin and left lung collapse related to poor clearance  P:   HOB > 30 VAP bundle Tracheal aspirate culture PRVC   A:   Baseline CT head Jan 2021: chronic atrphy and ischemia  Comatose / Acute encephalopathy - intubated without sedation meds. Seems obtunded since admit  P:   Repeat Head CT Prn sedation     A:   Chronic hypotension, Orthostatic Hypotension, Autonomic Dysfunction   P:  MAP goal > 55 with sbo > 95 Fluid bolus 500cc post intubation Midodrine to continue Hydrocort   A: Chronic CHF -EF 45-50   P: Lasix llater   A: Paroxysmal atrial fibrillation with RVR on 2/2  11/17/2019 - HR 110 ? A fib  P: ekg ccyclke enzymples   A:   Admitted with UTI Has baseline sacral deub  11/17/2019  - at risk HAP  P:   Check trach aspirate Vanc, flagyl, cefepime to continue    A:  AKI 2/22 - resolved 2/25  P:  Fluid bolus and monitor   A:  Mild low Mag  P: Replete mag -0 goal > 2gm%    A:   Recurrent aspiration, chronic malnutrition. Marland Kitchen BAseline PEG  P:   TF via PEG   A:  Anemia chronic and critical illness   P:  - PRBC for hgb </= 6.9gm%    - exceptions are   -  if ACS susepcted/confirmed then transfuse for hgb </= 8.0gm%,  or    -  active bleeding with hemodynamic instability, then transfuse regardless of hemoglobin value   At at all times try to transfuse 1 unit prbc as possible with exception of active hemorrhage     A:   At riskj low and high sugar   P:   ssi   Sacral decub stage IV present on admission   Plan  -Wound care consult     Best practice:  Diet: Tube feed DVT prophylaxis: Heparin SQ GI prophylaxis: PPI Glucose control: Montior Mobility: Bed Code Status: Full (palliative signed off 2/25_ Family Communication: As above. Husband updated 5:20 AM 11/17/2019  Disposition: change to ICU Status    ATTESTATION & SIGNATURE   The patient Barbara Flowers is critically ill with multiple organ systems failure and requires high complexity decision making for assessment and support, frequent evaluation and titration of therapies, application of advanced monitoring technologies and extensive interpretation of multiple databases.   Critical Care Time devoted to patient care services described in this note is  60  Minutes. This time reflects time of care of this signee Dr Brand Males. This critical care time does not reflect procedure time, or teaching time or supervisory time of PA/NP/Med student/Med Resident etc but could involve care discussion time     Dr. Brand Males, M.D., [facility].C.P  Pulmonary and Critical Care Medicine Staff Physician [place] Pager: [phone], If no answer or between  15:00h - 7:00h: call [phone]  11/17/2019 4:46 AM    LABS    PULMONARY Recent Labs  Lab 11/17/19 0308  PHART 7.495*  PCO2ART 31.8*  PO2ART 48.5*  HCO3 24.3  O2SAT 85.3    CBC Recent Labs  Lab 11/14/19 0215 11/15/19 0251 11/16/19 0411  HGB 7.8* 7.3* 7.6*  HCT 25.8* 24.5* 24.8*  WBC 23.9* 20.0* 32.3*  PLT 194 175 183    COAGULATION Recent Labs  Lab 11/06/2019 1151  INR 1.3*    CARDIAC  No results for input(s): TROPONINI in the last 168 hours. No results for input(s): PROBNP in the last 168 hours.   CHEMISTRY Recent Labs  Lab 11/12/19 0245 11/12/19 0825 11/12/19 1158 11/12/19 1714 11/13/19 0531 11/13/19 0817 11/13/19 1600 11/13/19 1600 11/13/19 1700 11/14/19 0215 11/14/19 0215 11/15/19 0251  11/16/19 0411  NA   < >  --   --   --  146*  --  146*  --   --  144  --  143 143  K   < >   < >  --  4.3 3.1*   < > 3.0*   < >  --  3.0*   < > 3.5 3.7  CL   < >  --   --   --  111  --  113*  --   --  113*  --  114* 114*  CO2   < >  --   --   --  23  --  22  --   --  21*  --  21* 22  GLUCOSE   < >  --   --   --  142*  --  136*  --   --  176*  --  193* 144*  BUN   < >  --   --   --  113*  --  111*  --   --  96*  --  94* 71*  CREATININE   < >  --   --   --  1.66*  --  1.30*  --   --  1.12*  --  0.85 0.68  CALCIUM   < >  --   --   --  8.2*  --  7.6*  --   --  7.6*  --  7.1* 7.1*  MG  --   --  2.2 1.8 1.7  --   --   --  2.2  --   --   --  1.8  PHOS  --   --  4.0 4.5 4.3  --   --   --  3.7  --   --  2.2*  --    < > = values in this interval not displayed.   Estimated Creatinine Clearance: 67 mL/min (by C-G formula based on SCr of 0.68 mg/dL).   LIVER Recent Labs  Lab 10/30/2019 1151  AST 58*  ALT 43  ALKPHOS 179*  BILITOT 0.7  PROT 5.9*  ALBUMIN 1.9*  INR 1.3*     INFECTIOUS Recent Labs  Lab 11/05/2019 1740 11/06/2019 1800 11/05/2019 2231 11/13/19 0531 11/13/19 1207  LATICACIDVEN 2.1*   < > 3.6* 2.7* 1.9  PROCALCITON 98.98  --   --   --   --    < > = values  in this interval not displayed.     ENDOCRINE CBG (last 3)  Recent Labs    11/16/19 1634 11/16/19 2206 11/17/19 0208  GLUCAP 122* 106* 125*         IMAGING x48h  - image(s) personally visualized  -   highlighted in bold DG CHEST PORT 1 VIEW  Result Date: 11/17/2019 CLINICAL DATA:  Status post intubation. EXAM: PORTABLE CHEST 1 VIEW COMPARISON:  One-view chest x-ray 11/17/19 at 3:25 a.m. Chest x-ray 11/16/2019 at 2:11 p.m. FINDINGS: Heart is mildly enlarged. Atherosclerotic calcifications are present at the aortic arch. Endotracheal tube is now present, terminating 3.5 cm above the carina. Aeration of the left lung is improved. Left pleural effusion is again seen. Interstitial and airspace opacities remain. Right  lung is clear. Degenerative changes are again noted at both shoulders. IMPRESSION: 1. Satisfactory positioning of the endotracheal tube. 2. Improved aeration of the left lung. 3. Persistent left-sided airspace disease and effusion. Infection is not excluded. Electronically Signed   By: San Morelle M.D.   On: 11/17/2019 04:46   DG Chest Port 1 View  Result Date: 11/17/2019 CLINICAL DATA:  Hypoxia. EXAM: PORTABLE CHEST 1 VIEW COMPARISON:  11/16/2019 FINDINGS: Interval subtotal collapse of the left lung. Mediastinal shift towards the left. Right lung remains clear. IMPRESSION: Marked worsening of atelectasis/pneumonia in the left lung with subtotal collapse. These results will be called to the ordering clinician or representative by the Radiologist Assistant, and communication documented in the PACS or zVision Dashboard. Electronically Signed   By: Nelson Chimes M.D.   On: 11/17/2019 03:38   DG CHEST PORT 1 VIEW  Result Date: 11/16/2019 CLINICAL DATA:  Shortness of breath.  History of breast cancer. EXAM: PORTABLE CHEST 1 VIEW COMPARISON:  Chest x-ray 11/13/2019. FINDINGS: Mediastinum hilar structures normal. Cardiomegaly. No pulmonary venous congestion. Low lung volumes. Left base atelectasis/infiltrate noted. Left-sided pleural effusion noted. Postsurgical changes left chest. Radiopacities again noted over the left breast. Stable deformity left scapula. Severe degenerative change again noted of both shoulders. Degenerative changes thoracic spine. IMPRESSION: 1.  Cardiomegaly.  No pulmonary venous congestion. 2. Low lung volumes. Left base atelectasis/infiltrate and left-sided pleural effusion noted. Electronically Signed   By: Marcello Moores  Register   On: 11/16/2019 14:40

## 2019-11-17 NOTE — Consult Note (Addendum)
WOC Nurse Consult Note: Reason for Consult: Sacral Pressure injury Wound type:Pressure Pressure Injury POA: Yes  [facility] Nurse reconsulted following readmission to ICU. Patient seen Monday for assessment of sacral, left IT, Left lateral foot, Right medial heel, right hip and right ear pressure injuries.  Measurements taken and wound care guidence provided for Nursing staff via the Orders.   [facility] nursing team will follow along with you, seeing every 7-10 days and will remain available to this patient, the nursing and medical teams.  Please re-consult if needed in between visits.  Thanks, Maudie Flakes, MSN, RN, [place], [facility]  Pager# [phone]

## 2019-11-17 NOTE — Progress Notes (Signed)
NAME:  Barbara Flowers, MRN:  [id number], DOB:  11/05/1950, LOS: 6 ADMISSION DATE:  10/27/2019, CONSULTATION DATE:  10/30/2019 REFERRING MD: Gardenia Phlegm MD, CHIEF COMPLAINT: Septic shock  Brief History   69 year old with complicated history as noted below with recent admission from 12/26 to 10/21/2019 for respiratory failure, MRSA pneumonia, CHF.  Returns to hospital with low urine output, altered mental status. She was started on levaquin as an outpatient for UTI but continued to worsen. In ED she has septic shock from indwelling blocked Foley (which was replaced), urosepsis with AKI, hyperkalemia.   Past Medical History  Breast cancer status postmastectomy, brain aneurysm, dementia, CVA with left hemiplegia, cardiac contractures, stage IV sacral decub, Shy-Drager syndrome, LV thrombus, paroxysmal atrial fibrillation/flutter   has a past surgical history that includes Lymph node biopsy; Hand surgery (Left, 2015); Gastric bypass (1985); Cholecystectomy; Craniotomy for hemispherectomy total / partial (Right, 2010); Breast lumpectomy (Left, 1991); Hand tendon surgery (Left); Bone marrow transplant (1991); Laparoscopic gastrostomy (N/A, 10/18/2019); Laparoscopic lysis of adhesions (N/A, 10/18/2019); and Incisional hernia repair (N/A, 10/18/2019).   Significant Hospital Events   2/20- Admit amount peripheral levo. Urine culture enterococcus. Renal US - no hyror 2/21 - rapid A. fib-given amnio bolus 2.22 - Rt midline cath  2/23 - Off pressors.  Some moaning in pain per RN.  Scr slightly improved. -0> CCM signed off with recommendation for hospice 2/25 -rushed emergently to ICU from floor. Comatose and resp distress and hypoxemic. CXR with L sided collapse. Elink called CRNA and patient intubated without any RSI medications. Post  Intubation CXR shows resolution of collapse. BP ok. CCM recalled.   Consults:  PCCM, Palliative  Procedures:    Significant Diagnostic Tests:  Renal ultrasound 2/20-no  hydronephrosis  Micro Data:  Blood cultures 2/20- Urine Cultures 2/20- enterococcus  MRSA PCR 2/20 > Positive   Antimicrobials:  Flagyl 2/21 >> Vanco 2/21 >> Cefepime 2/21 >>  Interim history/subjective:  RN reports no acute events overnight post intubation, unable to follow any commands currently on no sedation  Objective   Blood pressure (!) 120/58, pulse 89, temperature 98.1 F (36.7 C), temperature source Axillary, resp. rate 19, height 5\' 8"  (1.727 m), weight 70.2 kg, SpO2 96 %.    Vent Mode: PRVC FiO2 (%):  [100 %] 100 % Set Rate:  [20 bmp] 20 bmp Vt Set:  [440 mL-510 mL] 510 mL PEEP:  [15 cmH20] 15 cmH20 Plateau Pressure:  [23 cmH20] 23 cmH20   Intake/Output Summary (Last 24 hours) at 11/17/2019 1057 Last data filed at 11/17/2019 [id number] Gross per 24 hour  Intake 1559.43 ml  Output 1300 ml  Net 259.43 ml   Filed Weights   11/14/19 0500 11/15/19 0405 11/17/19 0348  Weight: 57.7 kg 57.7 kg 70.2 kg   Physical exam  General: Chronically ill appearing very deconditioned elderly female on mechanical ventilation, in NAD HEENT: ETT, MM pink/moist, PERRL,  Neuro: eyes open spontaneously will track, currently on no sedation, unable to follow any commands  CV: s1s2 regular rate and rhythm, no murmur, rubs, or gallops,  PULM: Diminished air entry bilaterally, no increased work of breathing GI: soft, bowel sounds active in all 4 quadrants, non-tender, non-distended  Extremities: warm/dry, 3+ pitting upper extremity edema  Skin: no rashes or lesions  Resolved Hospital Problem list     Assessment & Plan:   Acute hypoxic respiratory failure - requiring intubation 11/17/19 Recurrent aspiration   -Secondary to mucus pluggin and left lung collapse related to poor clearance  P: Continue ventilator support with lung protective strategies  Wean PEEP and FiO2 for sats greater than 90%. Head of bed elevated 30 degrees. Plateau pressures less than 30 cm H20.  Follow intermittent  chest x-ray and ABG.   SAT/SBT as tolerated, mentation preclude extubation  Ensure adequate pulmonary hygiene  Follow cultures  VAP bundle in place  PAD protocol  Septic shock secondary to UTI from indwelling Foley's and Question Component of Infection secondary to sacral wound  Plan  ICU monitoring  Vent support as ablow Follow cultures  Continue IV antibiotics  MAP< 65 Obtain / Trend lactic acid Monitor urine output Continue home midodrine   AKI secondary to septic shock and urinary retention > improving Hypernatremia  Hypokalemia  Plan  Follow renal function / urine output Trend Bmet Avoid nephrotoxins, ensure adequate renal perfusion  Supplement electrolytes as needed  Free water   Acute metabolic encephalopathy with history of underlying dementia/severe debility  - intubated without sedation meds. Seems obtunded since admit P:   Mentation seems slightly improved but she remains intubated with no need of sedation  Hold on repeat Head CT until discussion held with family  Delirium precautions  Supportive care  Minimize sedation as able   Chronic hypotension, Orthostatic Hypotension, Autonomic Dysfunction  P:  Bordeline hypertensive post intubation  MAP goal > 55 Continue home midodrine, hold for SBP greater than 130 1x dose stress dose steroid 2/25  Chronic combined biventricular heart failure  Paroxysmal atrial fibrillation with RVR on 2/2 -EF 45-50 10/19/2019 P: Daily assessment of diuretic need  Strict intake and output  Daily weight  Supportive care  Rate controlled   A:  Anemia chronic and critical illness P:  Transfuse per protocol  Trend CBC   Pressure ulcers: Sacral decub stage IV present on admission  Unstageable left foot, present on admission  Plan  Q2 turns  Frequent wound care WOC consulted  Nutritional support   Chronic protein malnutrition Sever dysphagia  Plan Tube feeds via PEG tube  Nutrition following  Strict NPO  Goal  of care Given patient very poor prognosis and repeat hospitalizations, attention again needs to be placed on [place] discussion. PMT is on board. Will discuss with spouse and son on arrival.    Best practice:  Diet: Tube feed DVT prophylaxis: Heparin SQ GI prophylaxis: PPI Glucose control: Montior Mobility: Bed Code Status: Full (palliative signed off 2/25) Family Communication: Pending  Disposition: change to ICU Status  LABS    PULMONARY Recent Labs  Lab 11/17/19 0308 11/17/19 0834  PHART 7.495* 7.504*  PCO2ART 31.8* 29.0*  PO2ART 48.5* 67.7*  HCO3 24.3 22.7  O2SAT 85.3 94.0    CBC Recent Labs  Lab 11/15/19 0251 11/16/19 0411 11/17/19 0816  HGB 7.3* 7.6* 9.3*  HCT 24.5* 24.8* 30.8*  WBC 20.0* 32.3* 45.3*  PLT 175 183 221    COAGULATION Recent Labs  Lab 11/06/2019 1151  INR 1.3*    CARDIAC  No results for input(s): TROPONINI in the last 168 hours. No results for input(s): PROBNP in the last 168 hours.   CHEMISTRY Recent Labs  Lab 11/12/19 0245 11/12/19 0825 11/12/19 1158 11/12/19 1714 11/13/19 0531 11/13/19 0817 11/13/19 1600 11/13/19 1600 11/13/19 1700 11/14/19 0215 11/14/19 0215 11/15/19 0251 11/15/19 0251 11/16/19 0411 11/17/19 0816  NA   < >  --   --   --  146*   < > 146*  --   --  144  --  143  --  143 146*  K   < >   < >  --  4.3 3.1*   < > 3.0*   < >  --  3.0*   < > 3.5   < > 3.7 3.2*  CL   < >  --   --   --  111   < > 113*  --   --  113*  --  114*  --  114* 115*  CO2   < >  --   --   --  23   < > 22  --   --  21*  --  21*  --  22 23  GLUCOSE   < >  --   --   --  142*   < > 136*  --   --  176*  --  193*  --  144* 123*  BUN   < >  --   --   --  113*   < > 111*  --   --  96*  --  94*  --  71* 68*  CREATININE   < >  --   --   --  1.66*   < > 1.30*  --   --  1.12*  --  0.85  --  0.68 0.67  CALCIUM   < >  --   --   --  8.2*   < > 7.6*  --   --  7.6*  --  7.1*  --  7.1* 7.3*  MG  --   --  2.2 1.8 1.7  --   --   --  2.2  --   --   --   --  1.8   --   PHOS  --   --  4.0 4.5 4.3  --   --   --  3.7  --   --  2.2*  --   --   --    < > = values in this interval not displayed.   Estimated Creatinine Clearance: 67 mL/min (by C-G formula based on SCr of 0.67 mg/dL).   LIVER Recent Labs  Lab 11/10/2019 1151  AST 58*  ALT 43  ALKPHOS 179*  BILITOT 0.7  PROT 5.9*  ALBUMIN 1.9*  INR 1.3*     INFECTIOUS Recent Labs  Lab 11/12/2019 1740 11/03/2019 1800 11/13/19 0531 11/13/19 1207 11/17/19 0816  LATICACIDVEN 2.1*   < > 2.7* 1.9 1.7  PROCALCITON 98.98  --   --   --   --    < > = values in this interval not displayed.     ENDOCRINE CBG (last 3)  Recent Labs    11/17/19 0208 11/17/19 0506 11/17/19 0819  GLUCAP 125* 137* 92    IMAGING x48h  - image(s) personally visualized  -   highlighted in bold DG CHEST PORT 1 VIEW  Result Date: 11/17/2019 CLINICAL DATA:  Status post intubation. EXAM: PORTABLE CHEST 1 VIEW COMPARISON:  One-view chest x-ray 11/17/19 at 3:25 a.m. Chest x-ray 11/16/2019 at 2:11 p.m. FINDINGS: Heart is mildly enlarged. Atherosclerotic calcifications are present at the aortic arch. Endotracheal tube is now present, terminating 3.5 cm above the carina. Aeration of the left lung is improved. Left pleural effusion is again seen. Interstitial and airspace opacities remain. Right lung is clear. Degenerative changes are again noted at both shoulders. IMPRESSION: 1. Satisfactory positioning of the endotracheal tube. 2. Improved aeration of the left lung. 3. Persistent left-sided airspace  disease and effusion. Infection is not excluded. Electronically Signed   By: San Morelle M.D.   On: 11/17/2019 04:46   DG Chest Port 1 View  Result Date: 11/17/2019 CLINICAL DATA:  Hypoxia. EXAM: PORTABLE CHEST 1 VIEW COMPARISON:  11/16/2019 FINDINGS: Interval subtotal collapse of the left lung. Mediastinal shift towards the left. Right lung remains clear. IMPRESSION: Marked worsening of atelectasis/pneumonia in the left lung with  subtotal collapse. These results will be called to the ordering clinician or representative by the Radiologist Assistant, and communication documented in the PACS or zVision Dashboard. Electronically Signed   By: Nelson Chimes M.D.   On: 11/17/2019 03:38   DG CHEST PORT 1 VIEW  Result Date: 11/16/2019 CLINICAL DATA:  Shortness of breath.  History of breast cancer. EXAM: PORTABLE CHEST 1 VIEW COMPARISON:  Chest x-ray 11/13/2019. FINDINGS: Mediastinum hilar structures normal. Cardiomegaly. No pulmonary venous congestion. Low lung volumes. Left base atelectasis/infiltrate noted. Left-sided pleural effusion noted. Postsurgical changes left chest. Radiopacities again noted over the left breast. Stable deformity left scapula. Severe degenerative change again noted of both shoulders. Degenerative changes thoracic spine. IMPRESSION: 1.  Cardiomegaly.  No pulmonary venous congestion. 2. Low lung volumes. Left base atelectasis/infiltrate and left-sided pleural effusion noted. Electronically Signed   By: Marcello Moores  Register   On: 11/16/2019 14:40   CRITICAL CARE Performed by: Johnsie Cancel   Total critical care time: 50 minutes  Critical care time was exclusive of separately billable procedures and treating other patients.  Critical care was necessary to treat or prevent imminent or life-threatening deterioration.  Critical care was time spent personally by me on the following activities: development of treatment plan with patient and/or surrogate as well as nursing, discussions with consultants, evaluation of patient's response to treatment, examination of patient, obtaining history from patient or surrogate, ordering and performing treatments and interventions, ordering and review of laboratory studies, ordering and review of radiographic studies, pulse oximetry and re-evaluation of patient's condition.  Johnsie Cancel, NP-C [facility] Contact / Pager information can be found on  Amion  11/17/2019, 11:38 AM

## 2019-11-18 DIAGNOSIS — I951 Orthostatic hypotension: Secondary | ICD-10-CM

## 2019-11-18 DIAGNOSIS — R4182 Altered mental status, unspecified: Secondary | ICD-10-CM

## 2019-11-18 DIAGNOSIS — F0150 Vascular dementia without behavioral disturbance: Secondary | ICD-10-CM

## 2019-11-18 DIAGNOSIS — J9602 Acute respiratory failure with hypercapnia: Secondary | ICD-10-CM

## 2019-11-18 LAB — CBC
HCT: 27.7 % — ABNORMAL LOW (ref 36.0–46.0)
Hemoglobin: 8.1 g/dL — ABNORMAL LOW (ref 12.0–15.0)
MCH: 27.7 pg (ref 26.0–34.0)
MCHC: 29.2 g/dL — ABNORMAL LOW (ref 30.0–36.0)
MCV: 94.9 fL (ref 80.0–100.0)
Platelets: 199 10*3/uL (ref 150–400)
RBC: 2.92 MIL/uL — ABNORMAL LOW (ref 3.87–5.11)
RDW: 19.2 % — ABNORMAL HIGH (ref 11.5–15.5)
WBC: 43.9 10*3/uL — ABNORMAL HIGH (ref 4.0–10.5)
nRBC: 0.7 % — ABNORMAL HIGH (ref 0.0–0.2)

## 2019-11-18 LAB — HEPATIC FUNCTION PANEL
ALT: 15 U/L (ref 0–44)
AST: 20 U/L (ref 15–41)
Albumin: 1.6 g/dL — ABNORMAL LOW (ref 3.5–5.0)
Alkaline Phosphatase: 75 U/L (ref 38–126)
Bilirubin, Direct: 0.2 mg/dL (ref 0.0–0.2)
Indirect Bilirubin: 0.5 mg/dL (ref 0.3–0.9)
Total Bilirubin: 0.7 mg/dL (ref 0.3–1.2)
Total Protein: 4.9 g/dL — ABNORMAL LOW (ref 6.5–8.1)

## 2019-11-18 LAB — GLUCOSE, CAPILLARY
Glucose-Capillary: 103 mg/dL — ABNORMAL HIGH (ref 70–99)
Glucose-Capillary: 106 mg/dL — ABNORMAL HIGH (ref 70–99)
Glucose-Capillary: 108 mg/dL — ABNORMAL HIGH (ref 70–99)
Glucose-Capillary: 113 mg/dL — ABNORMAL HIGH (ref 70–99)
Glucose-Capillary: 116 mg/dL — ABNORMAL HIGH (ref 70–99)
Glucose-Capillary: 139 mg/dL — ABNORMAL HIGH (ref 70–99)
Glucose-Capillary: 98 mg/dL (ref 70–99)

## 2019-11-18 LAB — BASIC METABOLIC PANEL
Anion gap: 8 (ref 5–15)
BUN: 74 mg/dL — ABNORMAL HIGH (ref 8–23)
CO2: 21 mmol/L — ABNORMAL LOW (ref 22–32)
Calcium: 7.5 mg/dL — ABNORMAL LOW (ref 8.9–10.3)
Chloride: 118 mmol/L — ABNORMAL HIGH (ref 98–111)
Creatinine, Ser: 0.72 mg/dL (ref 0.44–1.00)
GFR calc Af Amer: >60 mL/min (ref >60)
GFR calc non Af Amer: >60 mL/min (ref >60)
Glucose, Bld: 102 mg/dL — ABNORMAL HIGH (ref 70–99)
Potassium: 4.9 mmol/L (ref 3.5–5.1)
Sodium: 147 mmol/L — ABNORMAL HIGH (ref 135–145)

## 2019-11-18 LAB — BLOOD GAS, ARTERIAL
Acid-Base Excess: 0.7 mmol/L (ref 0.0–2.0)
Bicarbonate: 22.9 mmol/L (ref 20.0–28.0)
FIO2: 40
MECHVT: 510 mL
O2 Saturation: 96.6 %
PEEP: 5 cmH2O
Patient temperature: 98.9
RATE: 20 {breaths}/min
pCO2 arterial: 27 mmHg — ABNORMAL LOW (ref 32.0–48.0)
pH, Arterial: 7.54 — ABNORMAL HIGH (ref 7.350–7.450)
pO2, Arterial: 80.1 mmHg — ABNORMAL LOW (ref 83.0–108.0)

## 2019-11-18 LAB — PROTIME-INR
INR: 1.5 — ABNORMAL HIGH (ref 0.8–1.2)
Prothrombin Time: 18.1 s — ABNORMAL HIGH (ref 11.4–15.2)

## 2019-11-18 LAB — TROPONIN I (HIGH SENSITIVITY): Troponin I (High Sensitivity): 19 ng/L — ABNORMAL HIGH

## 2019-11-18 LAB — MAGNESIUM: Magnesium: 2.3 mg/dL (ref 1.7–2.4)

## 2019-11-18 LAB — PHOSPHORUS: Phosphorus: 1.7 mg/dL — ABNORMAL LOW (ref 2.5–4.6)

## 2019-11-18 LAB — LACTIC ACID, PLASMA: Lactic Acid, Venous: 1.5 mmol/L (ref 0.5–1.9)

## 2019-11-18 MED ORDER — METOPROLOL TARTRATE 25 MG/10 ML ORAL SUSPENSION
25.0000 mg | Freq: Two times a day (BID) | ORAL | Status: DC
  Administered 2019-11-18: 25 mg
  Filled 2019-11-18 (×2): qty 10

## 2019-11-18 MED ORDER — ATROPINE SULFATE 1 MG/10ML IJ SOSY
PREFILLED_SYRINGE | INTRAMUSCULAR | Status: AC
  Filled 2019-11-18: qty 10

## 2019-11-18 MED ORDER — FREE WATER
200.0000 mL | Status: DC
  Administered 2019-11-18 – 2019-11-22 (×23): 200 mL

## 2019-11-18 NOTE — Progress Notes (Signed)
RN noticed pt become bradycardic on the monitor; HR dropped to 38. RN assessed pt immediately, and sternal rubbed pt. Atropine was on standby at bedside. After pt was flipped back to full ventilator support, pts HR returned to NSR. BP 141/46 (75). Dr. Melvyn Novas notified by RN; orders placed to discontinue metoprolol for now. RN will continue to monitor patient's response to interventions.

## 2019-11-18 NOTE — Progress Notes (Signed)
Arterial puncture completed. Vent settings PRVC f 20 Vt 510 FiO2 .40 PEEP 15. Laboratory notified and sample sent for analysis.

## 2019-11-18 NOTE — Progress Notes (Addendum)
Central Tele notified RN of a 9 beat run of VT at 1250. Dr. Melvyn Novas notified by RN. Pt was started on 25 mg of metoprolol per Tube this morning. Pt in NSR at this time. No new orders at this time. RN will continue to monitor pt, per Dr. Gustavus Bryant orders.

## 2019-11-18 NOTE — Progress Notes (Signed)
Notified MD Wert in regards to patient having onset of Afib RVR, EKG obtained. Will continue to monitor and assess along side bedside RN, Hope S. No new orders at this time.

## 2019-11-18 NOTE — Progress Notes (Addendum)
NAME:  Barbara Flowers, MRN:  [id number], DOB:  [birth year], LOS: 7 ADMISSION DATE:  11/19/2019, CONSULTATION DATE:  10/31/2019 REFERRING MD: Gardenia Phlegm MD, CHIEF COMPLAINT: Septic shock  Brief History   69 year old with complicated history as noted below with recent admission from 12/26 to 10/21/2019 for respiratory failure, MRSA pneumonia, CHF.  Returns to hospital with low urine output, altered mental status. She was started on levaquin as an outpatient for UTI but continued to worsen. In ED she has septic shock from indwelling blocked Foley (which was replaced), urosepsis with AKI, hyperkalemia.   Past Medical History  Breast cancer status postmastectomy, brain aneurysm, dementia, CVA with left hemiplegia, cardiac contractures, stage IV sacral decub, Shy-Drager syndrome, LV thrombus, paroxysmal atrial fibrillation/flutter   has a past surgical history that includes Lymph node biopsy; Hand surgery (Left, 2015); Gastric bypass (1985); Cholecystectomy; Craniotomy for hemispherectomy total / partial (Right, 2010); Breast lumpectomy (Left, 1991); Hand tendon surgery (Left); Bone marrow transplant (1991); Laparoscopic gastrostomy (N/A, 10/18/2019); Laparoscopic lysis of adhesions (N/A, 10/18/2019); and Incisional hernia repair (N/A, 10/18/2019).   Significant Hospital Events   2/20- Admit amount peripheral levo. Urine culture enterococcus. Renal US - no hyror 2/21 - rapid A. fib-given amnio bolus 2.22 - Rt midline cath  2/23 - Off pressors.  Some moaning in pain per RN.  Scr slightly improved. -0> CCM signed off with recommendation for hospice 2/25 -rushed emergently to ICU from floor. Comatose and resp distress and hypoxemic. CXR with L sided collapse. Elink called CRNA and patient intubated without any RSI medications. Post  Intubation CXR shows resolution of collapse. BP ok. CCM recalled.  2/27 am freq PAF   > added beta blocker  Consults:  PCCM  Palliative  Procedures:    Significant  Diagnostic Tests:  Renal ultrasound 2/20-no hydronephrosis  Micro Data:  Blood cultures 2/20- Urine Cultures 2/20- enterococcus  MRSA PCR 2/20 > Positive  Trach 2/26 > abundant polys few gpcs no org seen >>>  Antimicrobials:  Flagyl 2/21 >> Vanco 2/21 >> Cefepime 2/21 >>   Scheduled Meds: . chlorhexidine gluconate (MEDLINE KIT)  15 mL Mouth Rinse BID  . Chlorhexidine Gluconate Cloth  6 each Topical Daily  . feeding supplement (PRO-STAT SUGAR FREE 64)  30 mL Per Tube Daily  . free water  150 mL Per Tube Q4H  . heparin  5,000 Units Subcutaneous Q8H  . insulin aspart  0-9 Units Subcutaneous Q4H  . mouth rinse  15 mL Mouth Rinse 10 times per day  . midodrine  10 mg Per Tube TID WC  . mupirocin ointment   Nasal BID  . nutrition supplement (JUVEN)  1 packet Per Tube BID BM  . pantoprazole sodium  40 mg Per Tube Daily  . sodium chloride flush  10-40 mL Intracatheter Q12H   Continuous Infusions: . sodium chloride Stopped (11/15/19 1050)  . ceFEPime (MAXIPIME) IV 200 mL/hr at 11/18/19 1032  . feeding supplement (VITAL HIGH PROTEIN) 1,000 mL (11/17/19 1539)  . metronidazole Stopped (11/18/19 0720)  . vancomycin Stopped (11/18/19 0848)   PRN Meds:.acetaminophen (TYLENOL) oral liquid 160 mg/5 mL, fentaNYL (SUBLIMAZE) injection, fentaNYL (SUBLIMAZE) injection, fentaNYL (SUBLIMAZE) injection, lip balm, midazolam, midazolam, sodium chloride flush   Interim history/subjective:  Appears comfortable s sedation on vent - new problem is intermittent RAF  Objective   Blood pressure 128/66, pulse (!) 150, temperature 98.8 F (37.1 C), resp. rate 20, height 5' 8"  (1.727 m), weight 68 kg, SpO2 99 %.    Vent Mode:  PSV;CPAP FiO2 (%):  [40 %-80 %] 40 % Set Rate:  [20 bmp] 20 bmp Vt Set:  [510 mL] 510 mL PEEP:  [5 cmH20-15 cmH20] 5 cmH20 Pressure Support:  [10 cmH20] 10 cmH20 Plateau Pressure:  [20 cmH20-25 cmH20] 24 cmH20   Intake/Output Summary (Last 24 hours) at 11/18/2019 1057 Last data  filed at 11/18/2019 1032 Gross per 24 hour  Intake 2579.12 ml  Output 1275 ml  Net 1304.12 ml   Filed Weights   11/15/19 0405 11/17/19 0348 11/18/19 0420  Weight: 57.7 kg 70.2 kg 68 kg   Physical exam    Pt  30 degrees hob weaning on psv s wob  No jvd Oropharynx et Neck supple Lungs with a few scattered exp > insp rhonchi bilaterally RRR no s3 or or sign murmur - NSR for now Abd soft/ limited  excursion  Extr warm with 2+ pitting  Neuro  sometimes tracks per nursing / not f/c  Resolved Hospital Problem list     Assessment & Plan:   Acute hypoxic respiratory failure - requiring intubation 11/17/19 Recurrent aspiration   -Secondary to mucus plugging and left lung collapse related to poor clearance P: Continue ventilator support with lung protective strategies  Wean PEEP and FiO2 for sats greater than 90%. Head of bed elevated 30 degrees. Plateau pressures less than 30 cm H20.  Follow intermittent chest x-ray and ABG.   SAT/SBT as tolerated, mentation preclude extubation  Ensure adequate pulmonary hygiene  Follow cultures  VAP bundle in place  PAD protocol Over ventilating am 2/27 > vent adjusted  Septic shock secondary to UTI from indwelling Foley's and Question Component of Infection secondary to sacral wound  Plan  ICU monitoring  Vent support   Follow cultures  Continue IV antibiotics  Continue home midodrine   AKI secondary to septic shock and urinary retention > improving Hypernatremia  Hypokalemia  Plan  Follow renal function / urine output Trend Bmet Avoid nephrotoxins, ensure adequate renal perfusion  Supplement electrolytes as needed  Free water increased 8/75  Acute metabolic encephalopathy with history of underlying dementia/severe debility  - intubated without sedation meds and only minimal tracking now  P:   Delirium precautions  Supportive care     Chronic hypotension, Orthostatic Hypotension, Autonomic Dysfunction  P:  Bordeline  hypertensive post intubation  MAP goal > 55 Continue home midodrine, hold for SBP greater than 130 1x dose stress dose steroid 2/25  Chronic combined biventricular heart failure  Paroxysmal atrial fibrillation with RVR on 2/2 -EF 45-50 10/19/2019 P: Daily assessment of diuretic need  Strict intake and output  Daily weight  Supportive care  Added beta blocker am 2/27  A:  Anemia chronic and critical illness   Lab Results  Component Value Date   HGB 8.1 (L) 11/18/2019   HGB 9.3 (L) 11/17/2019   HGB 7.6 (L) 11/16/2019    P:  Transfuse per protocol  Trend CBC   Pressure ulcers: Sacral decub stage IV present on admission  Unstageable left foot, present on admission  Plan  Q2 turns  Frequent wound care WOC consulted  Nutritional support   Chronic protein malnutrition Sever dysphagia  Plan Tube feeds via PEG tube  Nutrition following  Strict NPO  Goal of care Given patient very poor prognosis and repeat hospitalizations, attention again needs to be placed on [place] discussion.   Best practice:  Diet: Tube feed DVT prophylaxis: Heparin SQ GI prophylaxis: PPI Glucose control: Montior Mobility: Bed Code Status: Full  Family Communication: extended eol meeting 2/26 see notes with palliative care   Disposition:  ICU Status  LABS    PULMONARY Recent Labs  Lab 11/17/19 0308 11/17/19 0834 11/18/19 0218  PHART 7.495* 7.504* 7.540*  PCO2ART 31.8* 29.0* 27.0*  PO2ART 48.5* 67.7* 80.1*  HCO3 24.3 22.7 22.9  O2SAT 85.3 94.0 96.6    CBC Recent Labs  Lab 11/16/19 0411 11/17/19 0816 11/18/19 0218  HGB 7.6* 9.3* 8.1*  HCT 24.8* 30.8* 27.7*  WBC 32.3* 45.3* 43.9*  PLT 183 221 199    COAGULATION Recent Labs  Lab 11/06/2019 1151 11/18/19 0218  INR 1.3* 1.5*    CARDIAC  No results for input(s): TROPONINI in the last 168 hours. No results for input(s): PROBNP in the last 168 hours.   CHEMISTRY Recent Labs  Lab 11/12/19 0245 11/12/19 1714  11/13/19 0531 11/13/19 0817 11/13/19 1600 11/13/19 1700 11/14/19 0215 11/14/19 0215 11/15/19 0251 11/15/19 0251 11/16/19 0411 11/16/19 0411 11/17/19 0816 11/18/19 0218  NA   < >  --  146*   < >   < >  --  144  --  143  --  143  --  146* 147*  K   < > 4.3 3.1*   < >   < >  --  3.0*   < > 3.5   < > 3.7   < > 3.2* 4.9  CL   < >  --  111   < >   < >  --  113*  --  114*  --  114*  --  115* 118*  CO2   < >  --  23   < >   < >  --  21*  --  21*  --  22  --  23 21*  GLUCOSE   < >  --  142*   < >   < >  --  176*  --  193*  --  144*  --  123* 102*  BUN   < >  --  113*   < >   < >  --  96*  --  94*  --  71*  --  68* 74*  CREATININE   < >  --  1.66*   < >   < >  --  1.12*  --  0.85  --  0.68  --  0.67 0.72  CALCIUM   < >  --  8.2*   < >   < >  --  7.6*  --  7.1*  --  7.1*  --  7.3* 7.5*  MG  --  1.8 1.7  --   --  2.2  --   --   --   --  1.8  --   --  2.3  PHOS  --  4.5 4.3  --   --  3.7  --   --  2.2*  --   --   --   --  1.7*   < > = values in this interval not displayed.   Estimated Creatinine Clearance: 67 mL/min (by C-G formula based on SCr of 0.72 mg/dL).   LIVER Recent Labs  Lab 11/15/2019 1151 11/18/19 0218  AST 58* 20  ALT 43 15  ALKPHOS 179* 75  BILITOT 0.7 0.7  PROT 5.9* 4.9*  ALBUMIN 1.9* 1.6*  INR 1.3* 1.5*     INFECTIOUS Recent Labs  Lab 11/09/2019 1740 11/03/2019 1800 11/13/19 1207 11/17/19  0816 11/18/19 0218  LATICACIDVEN 2.1*   < > 1.9 1.7 1.5  PROCALCITON 98.98  --   --   --   --    < > = values in this interval not displayed.     ENDOCRINE CBG (last 3)  Recent Labs    11/18/19 0002 11/18/19 0354 11/18/19 0820  GLUCAP 106* 98 139*     The patient is critically ill with multiple organ systems failure and requires high complexity decision making for assessment and support, frequent evaluation and titration of therapies, application of advanced monitoring technologies and extensive interpretation of multiple databases. Critical Care Time devoted to  patient care services described in this note is 35  minutes.    Christinia Gully, MD Pulmonary and [place] [phone] After 5:30 PM or weekends, use Beeper [phone]

## 2019-11-19 DIAGNOSIS — B952 Enterococcus as the cause of diseases classified elsewhere: Secondary | ICD-10-CM

## 2019-11-19 LAB — GLUCOSE, CAPILLARY
Glucose-Capillary: 100 mg/dL — ABNORMAL HIGH (ref 70–99)
Glucose-Capillary: 110 mg/dL — ABNORMAL HIGH (ref 70–99)
Glucose-Capillary: 113 mg/dL — ABNORMAL HIGH (ref 70–99)
Glucose-Capillary: 117 mg/dL — ABNORMAL HIGH (ref 70–99)
Glucose-Capillary: 95 mg/dL (ref 70–99)
Glucose-Capillary: 98 mg/dL (ref 70–99)

## 2019-11-19 LAB — CULTURE, RESPIRATORY W GRAM STAIN: Special Requests: NORMAL

## 2019-11-19 LAB — PHOSPHORUS: Phosphorus: 1.9 mg/dL — ABNORMAL LOW (ref 2.5–4.6)

## 2019-11-19 LAB — MAGNESIUM: Magnesium: 1.9 mg/dL (ref 1.7–2.4)

## 2019-11-19 MED ORDER — VANCOMYCIN HCL IN DEXTROSE 1-5 GM/200ML-% IV SOLN
1000.0000 mg | INTRAVENOUS | Status: DC
  Administered 2019-11-20: 08:00:00 1000 mg via INTRAVENOUS
  Filled 2019-11-19: qty 200

## 2019-11-19 NOTE — Progress Notes (Signed)
Palliative care progress note  Family meeting occurred on 11/19/2019 per my request.  I met today with patient's husband and daughter in person and we called and spoke as well with her son on the phone.  We reviewed her clinical course and I answered all questions regarding events of the last 24 hours to the best of my ability.  Her mental status is improved from my encounter with her yesterday and she is opening eyes to verbal stimulation or rubbing of her face from her family.    We discussed care plan which includes continuation of full scope interventions while awaiting family to be able to come into town next weekend to see her and reassess her situation.  We also discussed potential limitations of care.  Agreement of her family is that Ms. Detert's desire if she were able to speak for herself would be no CPR in the event of full cardiac arrest, as this would likely result in increased pain without benefit of allowing her to have meaningful time with family.  - Limit of care is no CPR in the event of complete cardiac arrest.  Otherwise, full scope of interventions including vasopressors, ACLS medications, cardioversion, or any other indicated medical interventions.  I updated code status to reflect this:      Code Status Orders  (From admission, onward)         Start     Ordered   11/19/19 1527  Limited resuscitation (code)  Continuous    Question Answer Comment  In the event of cardiac or respiratory ARREST: Initiate Code Blue, Call Rapid Response Yes   In the event of cardiac or respiratory ARREST: Perform CPR No   In the event of cardiac or respiratory ARREST: Perform Intubation/Mechanical Ventilation Yes   In the event of cardiac or respiratory ARREST: Use NIPPV/BiPAp only if indicated Yes   In the event of cardiac or respiratory ARREST: Administer ACLS medications if indicated Yes   In the event of cardiac or respiratory ARREST: Perform Defibrillation or Cardioversion if  indicated Yes      11/19/19 1526         - Continue current interventions.  Family (including her son who is a Publishing rights manager and daughter who is an NP) will be in town at the end of the week (late Friday) and plan to reassess her situation at that time.  Would tentatively plan for family meeting on Saturday.  They have contact information for our team and will call if we can be of further assistance in the interim.  - Provided MyChart activation information per family request.  Time in: 1510 Time out: 1540  Total time: 30 minutes  Greater than 50%  of this time was spent counseling and coordinating care related to the above assessment and plan.  Micheline Rough, MD [facility] [phone]

## 2019-11-19 NOTE — Progress Notes (Signed)
NAME:  Barbara Flowers, MRN:  [id number], DOB:  1951/06/18, LOS: 8 ADMISSION DATE:  11/09/2019, CONSULTATION DATE:  11/06/2019 REFERRING MD: Gardenia Phlegm MD, CHIEF COMPLAINT: Septic shock  Brief History   69 year old with complicated history as noted below with recent admission from 12/26 to 10/21/2019 for respiratory failure, MRSA pneumonia, CHF.  Returns to hospital with low urine output, altered mental status. She was started on levaquin as an outpatient for UTI but continued to worsen. In ED she has septic shock from indwelling blocked Foley (which was replaced), urosepsis with AKI, hyperkalemia.   Past Medical History  Breast cancer status postmastectomy, brain aneurysm, dementia, CVA with left hemiplegia, cardiac contractures, stage IV sacral decub, Shy-Drager syndrome, LV thrombus, paroxysmal atrial fibrillation/flutter   has a past surgical history that includes Lymph node biopsy; Hand surgery (Left, 2015); Gastric bypass (1985); Cholecystectomy; Craniotomy for hemispherectomy total / partial (Right, 2010); Breast lumpectomy (Left, 1991); Hand tendon surgery (Left); Bone marrow transplant (1991); Laparoscopic gastrostomy (N/A, 10/18/2019); Laparoscopic lysis of adhesions (N/A, 10/18/2019); and Incisional hernia repair (N/A, 10/18/2019).   Significant Hospital Events   2/20- Admit amount peripheral levo. Urine culture enterococcus. Renal US - no hyror 2/21 - rapid A. fib-given amnio bolus 2.22 - Rt midline cath  2/23 - Off pressors.  Some moaning in pain per RN.  Scr slightly improved. -0> CCM signed off with recommendation for hospice 2/25 -rushed emergently to ICU from floor. Comatose and resp distress and hypoxemic. CXR with L sided collapse. Elink called CRNA and patient intubated without any RSI medications. Post  Intubation CXR shows resolution of collapse. BP ok. CCM recalled.  2/27 am freq PAF   > added beta blocker> bradycardic pm 2/27 so d/c'd   Consults:  PCCM   Palliative  Procedures:    Significant Diagnostic Tests:  Renal ultrasound 2/20-no hydronephrosis  Micro Data:  Blood cultures 2/20- Urine Cultures 2/20- enterococcus  MRSA PCR 2/20 > Positive  Trach 2/26 > abundant polys few gpcs no org seen >>>  MRSA   Antimicrobials:  Flagyl 2/21 >> 2/28 Cefepime 2/21 >> 2/28  Vanco 2/21 >>   Scheduled Meds: . chlorhexidine gluconate (MEDLINE KIT)  15 mL Mouth Rinse BID  . Chlorhexidine Gluconate Cloth  6 each Topical Daily  . feeding supplement (PRO-STAT SUGAR FREE 64)  30 mL Per Tube Daily  . free water  200 mL Per Tube Q4H  . heparin  5,000 Units Subcutaneous Q8H  . insulin aspart  0-9 Units Subcutaneous Q4H  . mouth rinse  15 mL Mouth Rinse 10 times per day  . midodrine  10 mg Per Tube TID WC  . mupirocin ointment   Nasal BID  . nutrition supplement (JUVEN)  1 packet Per Tube BID BM  . pantoprazole sodium  40 mg Per Tube Daily  . sodium chloride flush  10-40 mL Intracatheter Q12H  plus Vancomycin per phamacy protocol  Continuous Infusions: . sodium chloride Stopped (11/15/19 1050)  . feeding supplement (VITAL HIGH PROTEIN) 1,000 mL (11/19/19 0836)   PRN Meds:.acetaminophen (TYLENOL) oral liquid 160 mg/5 mL, fentaNYL (SUBLIMAZE) injection, fentaNYL (SUBLIMAZE) injection, fentaNYL (SUBLIMAZE) injection, lip balm, midazolam, midazolam, sodium chloride flush   Interim history/subjective:     Objective   Blood pressure (!) 133/46, pulse 69, temperature 98.8 F (37.1 C), temperature source Axillary, resp. rate 16, height 5' 8"  (1.727 m), weight 73.1 kg, SpO2 97 %.    Vent Mode: PRVC FiO2 (%):  [30 %-40 %] 30 % Set Rate:  [  20 bmp] 20 bmp Vt Set:  [510 mL] 510 mL PEEP:  [5 cmH20] 5 cmH20 Pressure Support:  [10 cmH20] 10 cmH20 Plateau Pressure:  [12 cmH20-15 cmH20] 12 cmH20   Intake/Output Summary (Last 24 hours) at 11/19/2019 1325 Last data filed at 11/19/2019 1125 Gross per 24 hour  Intake 3267.56 ml  Output 2375 ml  Net  892.56 ml   Filed Weights   11/17/19 0348 11/18/19 0420 11/19/19 0442  Weight: 70.2 kg 68 kg 73.1 kg   Physical exam    Pt  30 degrees hob / no longer on sedation  No jvd Oropharynx et Lungs with distant bs bilaterally RRR NSR on monitor/ no s3 abd soft Ext 1+ pitting    Neuro: does appear to be tracking some but not f/c    Resolved Hospital Problem list     Assessment & Plan:   Acute hypoxic respiratory failure - requiring intubation 11/17/19 Recurrent aspiration   -Secondary to mucus plugging and left lung collapse related to poor clearance  P: Continue ventilator support with lung protective strategies  Wean PEEP and FiO2 for sats greater than 90%. Head of bed elevated 30 degrees. Plateau pressures less than 30 cm H20.  Follow intermittent chest x-ray and ABG.   SAT/SBT as tolerated, mentation may  preclude extubation unless as full ncb  Ensure adequate pulmonary hygiene  Follow cultures  VAP bundle in place  PAD protocol    Septic shock secondary to UTI from indwelling Foley's and Question Component of Infection secondary to sacral wound and ? MRSA pna Plan  ICU monitoring  Vent support    Continue  Just Vanc now   Continue home midodrine   AKI secondary to septic shock and urinary retention > improving Hypernatremia  Hypokalemia  Plan  Follow renal function / urine output Trend Bmet Avoid nephrotoxins, ensure adequate renal perfusion  Supplement electrolytes as needed  Free water increased 6/72  Acute metabolic encephalopathy with history of underlying dementia/severe debility  - intubated without sedation meds and only minimal tracking now  P:   Delirium precautions  Supportive care  Condense prns to just fentanyl     Chronic hypotension, Orthostatic Hypotension, Autonomic Dysfunction  P:  Bordeline hypertensive post intubation  MAP goal > 55 Continue home midodrine, hold for SBP greater than 130    Chronic combined biventricular heart  failure  Paroxysmal atrial fibrillation with RVR on 2/2 -EF 45-50 10/19/2019 P: Daily assessment of diuretic need  Strict intake and output  Daily weight  Supportive care  Added beta blocker am 2/27  A:  Anemia chronic and critical illness   Lab Results  Component Value Date   HGB 8.1 (L) 11/18/2019   HGB 9.3 (L) 11/17/2019   HGB 7.6 (L) 11/16/2019    P:  Transfuse per protocol  Trend CBC   Pressure ulcers: Sacral decub stage IV present on admission  Unstageable left foot, present on admission  Plan  Q2 turns  Frequent wound care WOC consulted  Nutritional support   Chronic protein malnutrition Sever dysphagia  Plan Tube feeds via PEG tube  Nutrition following  Strict NPO  Goal of care Given patient very poor prognosis > eol discussions ongoing with palliative care on board.   Best practice:  Diet: Tube feed DVT prophylaxis: Heparin SQ GI prophylaxis: PPI Glucose control: Montior Mobility: Bed Code Status: Full   Family Communication: extended eol meeting 2/26 see notes with palliative care   Disposition:  ICU Status  LABS  PULMONARY Recent Labs  Lab 11/17/19 0308 11/17/19 0834 11/18/19 0218  PHART 7.495* 7.504* 7.540*  PCO2ART 31.8* 29.0* 27.0*  PO2ART 48.5* 67.7* 80.1*  HCO3 24.3 22.7 22.9  O2SAT 85.3 94.0 96.6    CBC Recent Labs  Lab 11/16/19 0411 11/17/19 0816 11/18/19 0218  HGB 7.6* 9.3* 8.1*  HCT 24.8* 30.8* 27.7*  WBC 32.3* 45.3* 43.9*  PLT 183 221 199    COAGULATION Recent Labs  Lab 11/18/19 0218  INR 1.5*    CARDIAC  No results for input(s): TROPONINI in the last 168 hours. No results for input(s): PROBNP in the last 168 hours.   CHEMISTRY Recent Labs  Lab 11/13/19 0531 11/13/19 0817 11/13/19 1600 11/13/19 1700 11/14/19 0215 11/14/19 0215 11/15/19 0251 11/15/19 0251 11/16/19 0411 11/16/19 0411 11/17/19 0816 11/18/19 0218 11/19/19 0342  NA 146*   < >   < >  --  144  --  143  --  143  --  146* 147*  --    K 3.1*   < >   < >  --  3.0*   < > 3.5   < > 3.7   < > 3.2* 4.9  --   CL 111   < >   < >  --  113*  --  114*  --  114*  --  115* 118*  --   CO2 23   < >   < >  --  21*  --  21*  --  22  --  23 21*  --   GLUCOSE 142*   < >   < >  --  176*  --  193*  --  144*  --  123* 102*  --   BUN 113*   < >   < >  --  96*  --  94*  --  71*  --  68* 74*  --   CREATININE 1.66*   < >   < >  --  1.12*  --  0.85  --  0.68  --  0.67 0.72  --   CALCIUM 8.2*   < >   < >  --  7.6*  --  7.1*  --  7.1*  --  7.3* 7.5*  --   MG 1.7  --   --  2.2  --   --   --   --  1.8  --   --  2.3 1.9  PHOS 4.3  --   --  3.7  --   --  2.2*  --   --   --   --  1.7* 1.9*   < > = values in this interval not displayed.   Estimated Creatinine Clearance: 67 mL/min (by C-G formula based on SCr of 0.72 mg/dL).   LIVER Recent Labs  Lab 11/18/19 0218  AST 20  ALT 15  ALKPHOS 75  BILITOT 0.7  PROT 4.9*  ALBUMIN 1.6*  INR 1.5*     INFECTIOUS Recent Labs  Lab 11/13/19 1207 11/17/19 0816 11/18/19 0218  LATICACIDVEN 1.9 1.7 1.5     ENDOCRINE CBG (last 3)  Recent Labs    11/19/19 0440 11/19/19 0806 11/19/19 1218  GLUCAP 100* 117* 98      Christinia Gully, MD Pulmonary and [place] [phone] After 5:30 PM or weekends, use Beeper [phone]

## 2019-11-19 NOTE — Progress Notes (Signed)
Pharmacy Antibiotic Note  Barbara Flowers is a 69 y.o. female admitted on 11/14/2019 with +MRSA PNA and enterococcus UTI. Pharmacy has been consulted for vancomycin dosing.  Today, 11/19/19  Day 9 abx's - vanc/cefepime/flagyl  WBC has plateaued ~45, off steroids  Afebrile  SCr 0.72, CrCl ~60  Plan:  D/C Cefepime & Flagyl as discussed with Dr Melvyn Novas  Continue Vancomycin 1gm IV q24h for MRSA PNA  Will plan to check levels with next dose (3/1 @ 0800)  Monitor renal function and cx data   Height: 5\' 8"  (172.7 cm) Weight: 161 lb 2.5 oz (73.1 kg) IBW/kg (Calculated) : 63.9  Temp (24hrs), Avg:98.7 F (37.1 C), Min:98.5 F (36.9 C), Max:98.8 F (37.1 C)  Recent Labs  Lab 11/13/19 0531 11/13/19 0531 11/13/19 0609 11/13/19 1207 11/13/19 1600 11/14/19 0215 11/15/19 0251 11/16/19 0411 11/17/19 0816 11/18/19 0218  WBC  --   --    < >  --   --  23.9* 20.0* 32.3* 45.3* 43.9*  CREATININE 1.66*  --   --   --    < > 1.12* 0.85 0.68 0.67 0.72  LATICACIDVEN 2.7*  --   --  1.9  --   --   --   --  1.7 1.5  VANCORANDOM 10   < >  --   --   --   --  14 19  --   --    < > = values in this interval not displayed.    Estimated Creatinine Clearance: 67 mL/min (by C-G formula based on SCr of 0.72 mg/dL).    Allergies  Allergen Reactions  . Gabapentin Other (See Comments)    seizure  . Tramadol Other (See Comments)    Brings on Seizures     Antimicrobials this admission: 2/20 vanc >> 2/20 cefepime >>2/28 2/20 flagyl >> 2/28  Dose adjustments/drug levels this admission: 2/22 at 0531 VR= 10 (38 hrs after 1250 mg dose)--> 1250 mg x1 at 1247 2/24 @0251  VR = 14  -->1gm x1 at 0636 2/25 @0411  VR = 19 prior dose 1gm 2/24 at 0636--> 750mg  iv x1 2/25 at 0600 2/25: changed cefepime from 2g q12 to 2g q8 due to CrCl 61   Microbiology results: Previous Cx:  09/27/19 sputum: MRSA 09/16/19 blood: proteus, CNS  2/20 BCx x2: NG-F 2/20 UCx Cath (PTA foley):<10K insignif growth FINAL 2/20 ucx:  40K enterococcus faecalis (pans sens) FINAL 2/20 MRSA PCR: positive 2/26 RespCx: MRSA   Thank you for allowing pharmacy to be a part of this patient's care.  Netta Cedars PharmD, BCPS 11/19/2019 1:46 PM

## 2019-11-20 ENCOUNTER — Inpatient Hospital Stay (HOSPITAL_COMMUNITY): Payer: Medicare Other

## 2019-11-20 DIAGNOSIS — R0603 Acute respiratory distress: Secondary | ICD-10-CM

## 2019-11-20 LAB — BASIC METABOLIC PANEL
Anion gap: 9 (ref 5–15)
BUN: 72 mg/dL — ABNORMAL HIGH (ref 8–23)
CO2: 21 mmol/L — ABNORMAL LOW (ref 22–32)
Calcium: 7.7 mg/dL — ABNORMAL LOW (ref 8.9–10.3)
Chloride: 118 mmol/L — ABNORMAL HIGH (ref 98–111)
Creatinine, Ser: 0.57 mg/dL (ref 0.44–1.00)
GFR calc Af Amer: >60 mL/min (ref >60)
GFR calc non Af Amer: >60 mL/min (ref >60)
Glucose, Bld: 102 mg/dL — ABNORMAL HIGH (ref 70–99)
Potassium: 3.5 mmol/L (ref 3.5–5.1)
Sodium: 148 mmol/L — ABNORMAL HIGH (ref 135–145)

## 2019-11-20 LAB — GLUCOSE, CAPILLARY
Glucose-Capillary: 102 mg/dL — ABNORMAL HIGH (ref 70–99)
Glucose-Capillary: 103 mg/dL — ABNORMAL HIGH (ref 70–99)
Glucose-Capillary: 107 mg/dL — ABNORMAL HIGH (ref 70–99)
Glucose-Capillary: 79 mg/dL (ref 70–99)
Glucose-Capillary: 89 mg/dL (ref 70–99)
Glucose-Capillary: 93 mg/dL (ref 70–99)

## 2019-11-20 LAB — CBC
HCT: 27.3 % — ABNORMAL LOW (ref 36.0–46.0)
Hemoglobin: 7.8 g/dL — ABNORMAL LOW (ref 12.0–15.0)
MCH: 27.6 pg (ref 26.0–34.0)
MCHC: 28.6 g/dL — ABNORMAL LOW (ref 30.0–36.0)
MCV: 96.5 fL (ref 80.0–100.0)
Platelets: 219 10*3/uL (ref 150–400)
RBC: 2.83 MIL/uL — ABNORMAL LOW (ref 3.87–5.11)
RDW: 21 % — ABNORMAL HIGH (ref 11.5–15.5)
WBC: 19.9 10*3/uL — ABNORMAL HIGH (ref 4.0–10.5)
nRBC: 0.3 % — ABNORMAL HIGH (ref 0.0–0.2)

## 2019-11-20 LAB — PHOSPHORUS: Phosphorus: 2 mg/dL — ABNORMAL LOW (ref 2.5–4.6)

## 2019-11-20 LAB — MAGNESIUM: Magnesium: 1.7 mg/dL (ref 1.7–2.4)

## 2019-11-20 LAB — VANCOMYCIN, PEAK: Vancomycin Pk: 64 ug/mL (ref 30–40)

## 2019-11-20 MED ORDER — HYDRALAZINE HCL 25 MG PO TABS
25.0000 mg | ORAL_TABLET | Freq: Four times a day (QID) | ORAL | Status: DC | PRN
  Administered 2019-11-20: 25 mg
  Filled 2019-11-20: qty 1

## 2019-11-20 MED ORDER — VANCOMYCIN VARIABLE DOSE PER UNSTABLE RENAL FUNCTION (PHARMACIST DOSING): Status: DC

## 2019-11-20 MED ORDER — POTASSIUM CHLORIDE 20 MEQ/15ML (10%) PO SOLN
40.0000 meq | Freq: Once | ORAL | Status: AC
  Administered 2019-11-20: 07:00:00 40 meq
  Filled 2019-11-20: qty 30

## 2019-11-20 NOTE — Progress Notes (Signed)
At 5:30AM pt have frequent PVC, missed beat, unifocal and Afib with HR of 150's then back to NS. BP was 186/72. Elink was notified and waiting for order. Will continue to monitor patient.

## 2019-11-20 NOTE — Progress Notes (Signed)
NAME:  Barbara Flowers, MRN:  [id number], DOB:  04/04/1951, LOS: 9 ADMISSION DATE:  11/19/2019, CONSULTATION DATE:  10/23/2019 REFERRING MD: Gardenia Phlegm MD, CHIEF COMPLAINT: Septic shock  Brief History   69 year old with complicated history as noted below with recent admission from 12/26 to 10/21/2019 for respiratory failure, MRSA pneumonia, CHF.  Returns to hospital with low urine output, altered mental status. She was started on levaquin as an outpatient for UTI but continued to worsen. In ED she has septic shock from indwelling blocked Foley (which was replaced), urosepsis with AKI, hyperkalemia.   Past Medical History  Breast cancer status postmastectomy, brain aneurysm, dementia, CVA with left hemiplegia, cardiac contractures, stage IV sacral decub, Shy-Drager syndrome, LV thrombus, paroxysmal atrial fibrillation/flutter   has a past surgical history that includes Lymph node biopsy; Hand surgery (Left, 2015); Gastric bypass (1985); Cholecystectomy; Craniotomy for hemispherectomy total / partial (Right, 2010); Breast lumpectomy (Left, 1991); Hand tendon surgery (Left); Bone marrow transplant (1991); Laparoscopic gastrostomy (N/A, 10/18/2019); Laparoscopic lysis of adhesions (N/A, 10/18/2019); and Incisional hernia repair (N/A, 10/18/2019).   Significant Hospital Events   2/20- Admit amount peripheral levo. Urine culture enterococcus. Renal US - no hyror 2/21 - rapid A. fib-given amnio bolus 2.22 - Rt midline cath  2/23 - Off pressors.  Some moaning in pain per RN.  Scr slightly improved. -0> CCM signed off with recommendation for hospice 2/25 -rushed emergently to ICU from floor. Comatose and resp distress and hypoxemic. CXR with L sided collapse. Elink called CRNA and patient intubated without any RSI medications. Post  Intubation CXR shows resolution of collapse. BP ok. CCM recalled.  2/27 am freq PAF   > added beta blocker> bradycardic pm 2/27 so d/c'd   Consults:  PCCM   Palliative  Procedures:    Significant Diagnostic Tests:  Renal ultrasound 2/20-no hydronephrosis  Micro Data:  Blood cultures 2/20- Urine Cultures 2/20- enterococcus  MRSA PCR 2/20 > Positive  Trach 2/26 > abundant polys few gpcs no org seen >>>  MRSA   Antimicrobials:  Flagyl 2/21 >> 2/28 Cefepime 2/21 >> 2/28  Vanco 2/21 >>   Scheduled Meds: . chlorhexidine gluconate (MEDLINE KIT)  15 mL Mouth Rinse BID  . Chlorhexidine Gluconate Cloth  6 each Topical Daily  . feeding supplement (PRO-STAT SUGAR FREE 64)  30 mL Per Tube Daily  . free water  200 mL Per Tube Q4H  . heparin  5,000 Units Subcutaneous Q8H  . insulin aspart  0-9 Units Subcutaneous Q4H  . mouth rinse  15 mL Mouth Rinse 10 times per day  . midodrine  10 mg Per Tube TID WC  . mupirocin ointment   Nasal BID  . nutrition supplement (JUVEN)  1 packet Per Tube BID BM  . pantoprazole sodium  40 mg Per Tube Daily  . sodium chloride flush  10-40 mL Intracatheter Q12H  plus Vancomycin per phamacy protocol  Continuous Infusions: . sodium chloride 10 mL/hr at 11/20/19 0651  . feeding supplement (VITAL HIGH PROTEIN) 1,000 mL (11/20/19 0540)  . vancomycin     PRN Meds:.acetaminophen (TYLENOL) oral liquid 160 mg/5 mL, fentaNYL (SUBLIMAZE) injection, hydrALAZINE, lip balm, sodium chloride flush   Interim history/subjective:  More awake today.  Has intermittent episodes of tachycardia  Objective   Blood pressure (!) 186/72, pulse 72, temperature 97.6 F (36.4 C), temperature source Axillary, resp. rate 20, height 5' 8"  (1.727 m), weight 71.3 kg, SpO2 97 %.    Vent Mode: PRVC FiO2 (%):  [30 %]  30 % Set Rate:  [20 bmp] 20 bmp Vt Set:  [510 mL] 510 mL PEEP:  [5 cmH20] 5 cmH20 Plateau Pressure:  [12 cmH20-17 cmH20] 17 cmH20   Intake/Output Summary (Last 24 hours) at 11/20/2019 0743 Last data filed at 11/20/2019 4034 Gross per 24 hour  Intake 4323.76 ml  Output 2675 ml  Net 1648.76 ml   Filed Weights   11/18/19 0420  11/19/19 0442 11/20/19 0449  Weight: 68 kg 73.1 kg 71.3 kg   Physical exam   Gen:      Frail, elderly HEENT:  EOMI, sclera anicteric Neck:     No masses; no thyromegaly, ETT Lungs:    Clear to auscultation bilaterally; normal respiratory effort CV:         Regular rate and rhythm; no murmurs Abd:      + bowel sounds; soft, non-tender; no palpable masses, no distension Ext:    No edema; adequate peripheral perfusion Skin:      Warm and dry; no rash Neuro: Awake, tracks with eyes    Resolved Hospital Problem list     Assessment & Plan:   Acute hypoxic respiratory failure - requiring intubation 11/17/19 Recurrent aspiration   MRSA pneumonia -Secondary to mucus plugging and left lung collapse related to poor clearance  P: Continue ventilator support, wean PEEP/FiO2 SBT's as tolerated Follow intermittent chest x-ray   Septic shock secondary to UTI from indwelling Foley's and Question Component of Infection secondary to sacral wound and ? MRSA pna Plan  ICU monitoring  Vent support    Continue  Vanc  AKI secondary to septic shock and urinary retention > improving Hypernatremia  Hypokalemia  Plan  Follow urine output, creatinine Free water increased 7/42  Acute metabolic encephalopathy with history of underlying dementia/severe debility  - intubated without sedation meds and only minimal tracking now  P:   Delirium precautions As needed fentanyl.  Limit sedating medication   Chronic hypotension, Orthostatic Hypotension, Autonomic Dysfunction  P:  Holding home midodrine as she is hypotensive now.  Chronic combined biventricular heart failure  Paroxysmal atrial fibrillation with RVR on 2/2 -EF 45-50 10/19/2019 P: Telemetry monitoring Beta-blocker on hold due to bradycardia  A:  Anemia of chronic disease  P:  Transfuse as needed, follow CBC  Pressure ulcers: Sacral decub stage IV present on admission  Unstageable left foot, present on admission  Plan  Wound  care, nutritional support  Chronic protein malnutrition Severe dysphagia  Plan Continue tube feeds via PEG Nutrition on board  Goal of care Very poor prognosis for recovery.  Palliative care on board Currently she is limited code.  Family is arriving end of this week to continue further discussions  Best practice:  Diet: Tube feed DVT prophylaxis: Heparin SQ GI prophylaxis: PPI Glucose control: Montior Mobility: Bed Code Status: Full   Family Communication: extended eol meeting 2/26 see notes with palliative care   Disposition:  ICU Status  LABS    PULMONARY Recent Labs  Lab 11/17/19 0308 11/17/19 0834 11/18/19 0218  PHART 7.495* 7.504* 7.540*  PCO2ART 31.8* 29.0* 27.0*  PO2ART 48.5* 67.7* 80.1*  HCO3 24.3 22.7 22.9  O2SAT 85.3 94.0 96.6    CBC Recent Labs  Lab 11/17/19 0816 11/18/19 0218 11/20/19 0312  HGB 9.3* 8.1* 7.8*  HCT 30.8* 27.7* 27.3*  WBC 45.3* 43.9* 19.9*  PLT 221 199 219    COAGULATION Recent Labs  Lab 11/18/19 0218  INR 1.5*    CARDIAC  No results for input(s):  TROPONINI in the last 168 hours. No results for input(s): PROBNP in the last 168 hours.   CHEMISTRY Recent Labs  Lab 11/13/19 1700 11/14/19 0215 11/15/19 0251 11/15/19 0251 11/16/19 0411 11/16/19 0411 11/17/19 0623 11/17/19 0816 11/18/19 0218 11/19/19 0342 11/20/19 0312  NA  --    < > 143  --  143  --  146*  --  147*  --  148*  K  --    < > 3.5   < > 3.7   < > 3.2*   < > 4.9  --  3.5  CL  --    < > 114*  --  114*  --  115*  --  118*  --  118*  CO2  --    < > 21*  --  22  --  23  --  21*  --  21*  GLUCOSE  --    < > 193*  --  144*  --  123*  --  102*  --  102*  BUN  --    < > 94*  --  71*  --  68*  --  74*  --  72*  CREATININE  --    < > 0.85  --  0.68  --  0.67  --  0.72  --  0.57  CALCIUM  --    < > 7.1*  --  7.1*  --  7.3*  --  7.5*  --  7.7*  MG 2.2  --   --   --  1.8  --   --   --  2.3 1.9 1.7  PHOS 3.7  --  2.2*  --   --   --   --   --  1.7* 1.9* 2.0*   <  > = values in this interval not displayed.   Estimated Creatinine Clearance: 67 mL/min (by C-G formula based on SCr of 0.57 mg/dL).   LIVER Recent Labs  Lab 11/18/19 0218  AST 20  ALT 15  ALKPHOS 75  BILITOT 0.7  PROT 4.9*  ALBUMIN 1.6*  INR 1.5*     INFECTIOUS Recent Labs  Lab 11/13/19 1207 11/17/19 0816 11/18/19 0218  LATICACIDVEN 1.9 1.7 1.5     ENDOCRINE CBG (last 3)  Recent Labs    11/19/19 1939 11/19/19 2352 11/20/19 0343  GLUCAP 113* 95 93    The patient is critically ill with multiple organ system failure and requires high complexity decision making for assessment and support, frequent evaluation and titration of therapies, advanced monitoring, review of radiographic studies and interpretation of complex data.   Critical Care Time devoted to patient care services, exclusive of separately billable procedures, described in this note is 35 minutes.   Marshell Garfinkel MD [facility] Please see [email or web address] for pager details.  11/20/2019, 7:50 AM

## 2019-11-20 NOTE — Progress Notes (Signed)
Pharmacy Antibiotic Note  Barbara Flowers is a 69 y.o. female admitted on 10/28/2019 with +MRSA PNA and enterococcus UTI. Pharmacy has been consulted for vancomycin dosing.  Today, 11/20/19  Day 10 abxs - vanc for MRSA PNA, MIC vanc = 2  WBC 43.9>>19 off steroids since 2/26  Afebrile  SCr 0.57, CrCl ~71  Checking vancomycin peak/trough around 4th dose of vancomycin 2 gm IV q24. Last dose given today at 8 am  Vancomycin peak drawn ~ 1 hour after end of infusion is elevated a 64 mcg/ml  Plan:  Dc scheduled Vancomycin 1gm IV q24h for MRSA PNA  check vancomycin trough 3/2 at 0730 am  Monitor renal function and cx data   Height: 5\' 8"  (172.7 cm) Weight: 157 lb 3 oz (71.3 kg) IBW/kg (Calculated) : 63.9  Temp (24hrs), Avg:98.3 F (36.8 C), Min:97.6 F (36.4 C), Max:98.9 F (37.2 C)  Recent Labs  Lab 11/13/19 1207 11/13/19 1600 11/15/19 0251 11/16/19 0411 11/17/19 0816 11/18/19 0218 11/20/19 0312 11/20/19 1009  WBC  --    < > 20.0* 32.3* 45.3* 43.9* 19.9*  --   CREATININE  --    < > 0.85 0.68 0.67 0.72 0.57  --   LATICACIDVEN 1.9  --   --   --  1.7 1.5  --   --   VANCOPEAK  --   --   --   --   --   --   --  64*  VANCORANDOM  --   --  14 19  --   --   --   --    < > = values in this interval not displayed.    Estimated Creatinine Clearance: 67 mL/min (by C-G formula based on SCr of 0.57 mg/dL).    Allergies  Allergen Reactions  . Gabapentin Other (See Comments)    seizure  . Tramadol Other (See Comments)    Brings on Seizures     Antimicrobials this admission: 2/20 vanc >> 2/20 cefepime >>2/28 2/20 flagyl >> 2/28  Dose adjustments/drug levels this admission: 2/22 at 0531 VR= 10 (38 hrs after 1250 mg dose)--> 1250 mg x1 at 1247 2/24 @0251  VR = 14  -->1gm x1 at 0636 2/25 @0411  VR = 19 prior dose 1gm 2/24 at 0636--> 750mg  iv x1 2/25 at 0600 2/25: changed cefepime from 2g q12 to 2g q8 due to CrCl 61   Microbiology results: Previous Cx:  09/27/19 sputum:  MRSA 09/16/19 blood: proteus, CNS  2/20 BCx x2: NG-F 2/20 UCx Cath (PTA foley):<10K insignif growth FINAL 2/20 ucx: 40K enterococcus faecalis (pans sens) FINAL 2/20 MRSA PCR: positive 2/26 RespCx: MRSA, MIC = 2   Thank you for allowing pharmacy to be a part of this patient's care.  Eudelia Bunch, Pharm.D [phone] 11/20/2019 11:15 AM

## 2019-11-20 NOTE — Progress Notes (Signed)
eLink Physician-Brief Progress Note Patient Name: Barbara Flowers DOB: Feb 13, 1951 MRN: [id number]   Date of Service  11/20/2019  HPI/Events of Note  Notified of HTN BP 192/59  HR 72 with frequent ectopies  On lab review K 3.5  eICU Interventions  Ordered hydralazine 25 per tube prn for SBP > 170 and replace K 40 meqs per tube     Intervention Category Major Interventions: Arrhythmia - evaluation and management;Hypertension - evaluation and management;Electrolyte abnormality - evaluation and management  [name] 11/20/2019, 6:35 AM

## 2019-11-20 DEATH — deceased

## 2019-11-21 ENCOUNTER — Inpatient Hospital Stay: Payer: Self-pay

## 2019-11-21 ENCOUNTER — Inpatient Hospital Stay (HOSPITAL_COMMUNITY): Payer: Medicare Other

## 2019-11-21 LAB — GLUCOSE, CAPILLARY
Glucose-Capillary: 82 mg/dL (ref 70–99)
Glucose-Capillary: 98 mg/dL (ref 70–99)
Glucose-Capillary: 97 mg/dL (ref 70–99)
Glucose-Capillary: 91 mg/dL (ref 70–99)
Glucose-Capillary: 78 mg/dL (ref 70–99)
Glucose-Capillary: 93 mg/dL (ref 70–99)

## 2019-11-21 LAB — BASIC METABOLIC PANEL
Anion gap: 8 (ref 5–15)
BUN: 69 mg/dL — ABNORMAL HIGH (ref 8–23)
CO2: 21 mmol/L — ABNORMAL LOW (ref 22–32)
Calcium: 7.6 mg/dL — ABNORMAL LOW (ref 8.9–10.3)
Chloride: 114 mmol/L — ABNORMAL HIGH (ref 98–111)
Creatinine, Ser: 0.59 mg/dL (ref 0.44–1.00)
GFR calc Af Amer: >60 mL/min (ref >60)
GFR calc non Af Amer: >60 mL/min (ref >60)
Glucose, Bld: 89 mg/dL (ref 70–99)
Potassium: 3.7 mmol/L (ref 3.5–5.1)
Sodium: 143 mmol/L (ref 135–145)

## 2019-11-21 LAB — PHOSPHORUS: Phosphorus: 2.2 mg/dL — ABNORMAL LOW (ref 2.5–4.6)

## 2019-11-21 LAB — MAGNESIUM: Magnesium: 1.6 mg/dL — ABNORMAL LOW (ref 1.7–2.4)

## 2019-11-21 MED ORDER — MAGNESIUM SULFATE IN D5W 1-5 GM/100ML-% IV SOLN
1.0000 g | Freq: Once | INTRAVENOUS | Status: AC
  Administered 2019-11-21: 07:00:00 1 g via INTRAVENOUS
  Filled 2019-11-21: qty 100

## 2019-11-21 MED ORDER — AMIODARONE HCL IN DEXTROSE 360-4.14 MG/200ML-% IV SOLN
30.0000 mg/h | INTRAVENOUS | Status: DC
  Administered 2019-11-21 – 2019-11-22 (×4): 30 mg/h via INTRAVENOUS
  Filled 2019-11-21 (×3): qty 200

## 2019-11-21 MED ORDER — AMIODARONE HCL IN DEXTROSE 360-4.14 MG/200ML-% IV SOLN
60.0000 mg/h | INTRAVENOUS | Status: DC
  Administered 2019-11-21 (×2): 60 mg/h via INTRAVENOUS
  Filled 2019-11-21 (×2): qty 200

## 2019-11-21 MED ORDER — AMIODARONE LOAD VIA INFUSION
150.0000 mg | Freq: Once | INTRAVENOUS | Status: AC
  Administered 2019-11-21: 06:00:00 150 mg via INTRAVENOUS
  Filled 2019-11-21: qty 83.34

## 2019-11-21 MED ORDER — LINEZOLID 600 MG/300ML IV SOLN
600.0000 mg | Freq: Two times a day (BID) | INTRAVENOUS | Status: DC
  Administered 2019-11-21 – 2019-11-26 (×10): 600 mg via INTRAVENOUS
  Filled 2019-11-21 (×10): qty 300

## 2019-11-21 MED ORDER — POTASSIUM CHLORIDE 20 MEQ/15ML (10%) PO SOLN
40.0000 meq | Freq: Once | ORAL | Status: AC
  Administered 2019-11-21: 06:00:00 40 meq via ORAL
  Filled 2019-11-21: qty 30

## 2019-11-21 NOTE — Progress Notes (Addendum)
NAME:  Barbara Flowers, MRN:  [id number], DOB:  1951-04-27, LOS: 56 ADMISSION DATE:  11/13/2019, CONSULTATION DATE:  10/27/2019 REFERRING MD: Gardenia Phlegm MD, CHIEF COMPLAINT: Septic shock  Brief History   69 year old with complicated history as noted below with recent admission from 12/26 to 10/21/2019 for respiratory failure, MRSA pneumonia, CHF.  Returns to hospital with low urine output, altered mental status. She was started on levaquin as an outpatient for UTI but continued to worsen. In ED she has septic shock from indwelling blocked Foley (which was replaced), urosepsis with AKI, hyperkalemia.   Past Medical History  Breast cancer status postmastectomy, brain aneurysm, dementia, CVA with left hemiplegia, cardiac contractures, stage IV sacral decub, Shy-Drager syndrome, LV thrombus, paroxysmal atrial fibrillation/flutter   has a past surgical history that includes Lymph node biopsy; Hand surgery (Left, 2015); Gastric bypass (1985); Cholecystectomy; Craniotomy for hemispherectomy total / partial (Right, 2010); Breast lumpectomy (Left, 1991); Hand tendon surgery (Left); Bone marrow transplant (1991); Laparoscopic gastrostomy (N/A, 10/18/2019); Laparoscopic lysis of adhesions (N/A, 10/18/2019); and Incisional hernia repair (N/A, 10/18/2019).  Significant Hospital Events   2/20- Admit amount peripheral levo. Urine culture enterococcus. Renal US - no hyror 2/21 - rapid A. fib-given amnio bolus 2.22 - Rt midline cath  2/23 - Off pressors.  Some moaning in pain per RN.  Scr slightly improved. -0> CCM signed off with recommendation for hospice 2/25 -rushed emergently to ICU from floor. Comatose and resp distress and hypoxemic. CXR with L sided collapse. Elink called CRNA and patient intubated without any RSI medications. Post  Intubation CXR shows resolution of collapse. BP ok. CCM recalled.  2/27 am freq PAF   > added beta blocker> bradycardic pm 2/27 so d/c'd  3/2 rapid afib, amio  restarted  Consults:  PCCM  Palliative  Procedures:    Significant Diagnostic Tests:  Renal ultrasound 2/20-no hydronephrosis  Micro Data:  Blood cultures 2/20- Urine Cultures 2/20- enterococcus  MRSA PCR 2/20 > Positive  Trach 2/26 > abundant polys few gpcs no org seen >>>  MRSA   Antimicrobials:  Flagyl 2/21 >> 2/28 Cefepime 2/21 >> 2/28  Vanco 2/21 >>  Interim history/subjective:  More somnolent today.  Did not do well on weaning trial Restarted amiodarone due to paroxysmal atrial fibrillation.  Unable to get blood draw for labs today.  Objective   Blood pressure (!) 122/51, pulse 78, temperature (!) 97.4 F (36.3 C), temperature source Axillary, resp. rate 13, height 5\' 8"  (1.727 m), weight 71.6 kg, SpO2 99 %.    Vent Mode: CPAP;PSV FiO2 (%):  [30 %] 30 % Set Rate:  [20 bmp] 20 bmp Vt Set:  [510 mL] 510 mL PEEP:  [5 cmH20] 5 cmH20 Pressure Support:  [10 cmH20] 10 cmH20 Plateau Pressure:  [13 cmH20-18 cmH20] 18 cmH20   Intake/Output Summary (Last 24 hours) at 11/21/2019 0857 Last data filed at 11/21/2019 0747 Gross per 24 hour  Intake 2614.43 ml  Output 2550 ml  Net 64.43 ml   Filed Weights   11/19/19 0442 11/20/19 0449 11/21/19 0500  Weight: 73.1 kg 71.3 kg 71.6 kg   Physical exam  Gen:     Frail , Elderly HEENT:  EOMI, sclera anicteric Neck:     No masses; no thyromegaly, ETT Lungs:    Clear to auscultation bilaterally; normal respiratory effort CV:         Regular rate and rhythm; no murmurs Abd:      + bowel sounds; soft, non-tender; no palpable masses, no  distension Ext:    No edema; adequate peripheral perfusion Skin:      Warm and dry; no rash Neuro: Somnolent, opens eyes to stimulus.   Resolved Hospital Problem list     Assessment & Plan:   Acute hypoxic respiratory failure - requiring intubation 11/17/19 Recurrent aspiration   MRSA pneumonia -Secondary to mucus plugging and left lung collapse related to poor clearance  P: Continue  ventilator support, wean PEEP/FiO2 SBT's as tolerated Follow intermittent chest x-ray   Septic shock secondary to UTI from indwelling Foley's and Question Component of Infection secondary to sacral wound and ? MRSA pna Plan  ICU monitoring  Vent support    Continue  Vanc  AKI secondary to septic shock and urinary retention > improving Hypernatremia  Hypokalemia  Plan  Follow urine output, creatinine Free water increased [id number]  Acute metabolic encephalopathy with history of underlying dementia/severe debility  - intubated without sedation meds and only minimal tracking now  P:   Delirium precautions As needed fentanyl.  Limit sedating medication   Chronic hypotension, Orthostatic Hypotension, Autonomic Dysfunction  P:  Holding home midodrine due to hypertension  Chronic combined biventricular heart failure  Paroxysmal atrial fibrillation with RVR on 2/2 -EF 45-50 10/19/2019 P: Telemetry monitoring Beta-blocker on hold due to bradycardia Will need PICC line for IV access  A:  Anemia of chronic disease  P:  Transfuse as needed, follow CBC  Pressure ulcers: Sacral decub stage IV present on admission  Unstageable left foot, present on admission  Plan  Wound care, nutritional support  Chronic protein malnutrition Severe dysphagia  Plan Continue tube feeds via PEG Nutrition on board  Goal of care Very poor prognosis for recovery.  Palliative care on board Currently she is limited code.  Discussed with husband at bedside on 3/2. He is aware that his wife has been through a lot.  States that the plan is to extubate after children have a chance to visit at end of week and let nature take its course and no plans for reintubation.  Best practice:  Diet: Tube feed DVT prophylaxis: Heparin SQ GI prophylaxis: PPI Glucose control: Montior Mobility: Bed Code Status: Full   Family Communication: See above Disposition:  ICU Status  The patient is critically ill with  multiple organ system failure and requires high complexity decision making for assessment and support, frequent evaluation and titration of therapies, advanced monitoring, review of radiographic studies and interpretation of complex data.   Critical Care Time devoted to patient care services, exclusive of separately billable procedures, described in this note is 35 minutes.   Marshell Garfinkel MD [facility] Please see [email or web address] for pager details.  11/21/2019, 8:57 AM

## 2019-11-21 NOTE — Progress Notes (Signed)
After RN bathed pt, HR went up to the 150s-180s sustained. E-link notified. 12 lead EKG obtained. EKG read a fib RVR.  Amiodarone bolus & infusion started. K+ orderd & given. Mag ordered & waiting for pharmacy to tube up. Pt HR now back down into the 80s-90s. RN will continue to monitor closely.

## 2019-11-21 NOTE — Progress Notes (Signed)
[facility] [place] Progress Note Patient Name: Barbara Flowers DOB: May 06, 1951 MRN: [id number]   Date of Service  11/21/2019  HPI/Events of Note  HR = 163 -  Looks like AFIB on the bedside monitor.  eICU Interventions  Will order: 12 Lead EKG now.      Intervention Category Major Interventions: Arrhythmia - evaluation and management  Braxxton Stoudt [place] 11/21/2019, 5:20 AM

## 2019-11-21 NOTE — Progress Notes (Signed)
Mannam, MD made aware about IV team RN unable to insert PICC.

## 2019-11-21 NOTE — Progress Notes (Signed)
[facility] Progress Note Patient Name: Barbara Flowers DOB: [birth year] MRN: [id number]   Date of Service  11/21/2019  HPI/Events of Note  AFIB with RVR - Ventricular rate = 160's. BP = 111/75. K+ = 3.5, Mg++ = 1.7 and Creatinine = 0.57.  eICU Interventions  Will order: 1. Replace K+ and Mg++. 2 Amiodarone IV load and infusion.      Intervention Category Major Interventions: Electrolyte abnormality - evaluation and management;Arrhythmia - evaluation and management  Mariellen Blaney [place] 11/21/2019, 5:35 AM

## 2019-11-21 NOTE — Progress Notes (Signed)
Sutures noted in place from G tube placement 10/18/19. Removed without complication or difficulty.  Eliseo Gum MSN, AGACNP-BC [place] [id number] If no answer, [id number] 11/21/2019, 11:00 AM

## 2019-11-21 NOTE — Progress Notes (Signed)
Attempt to place PICC right arm.  Left arm not suitable due to Left breast CA and left hemiplegia.  Attempt x three gaining access to brachial vein twice but not able to advance guide wire.  PICC not successful.  If PICC still desirable recommend consult to Interventional radiology.

## 2019-11-21 NOTE — Progress Notes (Addendum)
Lab unable to obtain vancomycin trough level and midline would not pull back. PICC line ordered by Vaughan Browner, MD for IV team to place at bedside.

## 2019-11-22 ENCOUNTER — Inpatient Hospital Stay (HOSPITAL_COMMUNITY): Payer: Medicare Other

## 2019-11-22 LAB — CBC WITH DIFFERENTIAL/PLATELET
Abs Immature Granulocytes: 0.14 10*3/uL — ABNORMAL HIGH (ref 0.00–0.07)
Basophils Absolute: 0 10*3/uL (ref 0.0–0.1)
Basophils Relative: 0 %
Eosinophils Absolute: 0.1 10*3/uL (ref 0.0–0.5)
Eosinophils Relative: 1 %
HCT: 22.4 % — ABNORMAL LOW (ref 36.0–46.0)
Hemoglobin: 6.7 g/dL — CL (ref 12.0–15.0)
Immature Granulocytes: 1 %
Lymphocytes Relative: 15 %
Lymphs Abs: 2 10*3/uL (ref 0.7–4.0)
MCH: 28.4 pg (ref 26.0–34.0)
MCHC: 29.9 g/dL — ABNORMAL LOW (ref 30.0–36.0)
MCV: 94.9 fL (ref 80.0–100.0)
Monocytes Absolute: 0.5 10*3/uL (ref 0.1–1.0)
Monocytes Relative: 4 %
Neutro Abs: 10.2 10*3/uL — ABNORMAL HIGH (ref 1.7–7.7)
Neutrophils Relative %: 79 %
Platelets: 193 10*3/uL (ref 150–400)
RBC: 2.36 MIL/uL — ABNORMAL LOW (ref 3.87–5.11)
RDW: 21.8 % — ABNORMAL HIGH (ref 11.5–15.5)
WBC: 12.9 10*3/uL — ABNORMAL HIGH (ref 4.0–10.5)
nRBC: 0 % (ref 0.0–0.2)

## 2019-11-22 LAB — GLUCOSE, CAPILLARY
Glucose-Capillary: 77 mg/dL (ref 70–99)
Glucose-Capillary: 72 mg/dL (ref 70–99)
Glucose-Capillary: 126 mg/dL — ABNORMAL HIGH (ref 70–99)
Glucose-Capillary: 89 mg/dL (ref 70–99)
Glucose-Capillary: 89 mg/dL (ref 70–99)
Glucose-Capillary: 97 mg/dL (ref 70–99)

## 2019-11-22 LAB — CBC
HCT: 17.1 % — ABNORMAL LOW (ref 36.0–46.0)
Hemoglobin: 5.1 g/dL — CL (ref 12.0–15.0)
MCH: 27.9 pg (ref 26.0–34.0)
MCHC: 29.8 g/dL — ABNORMAL LOW (ref 30.0–36.0)
MCV: 93.4 fL (ref 80.0–100.0)
Platelets: 94 10*3/uL — ABNORMAL LOW (ref 150–400)
RBC: 1.83 MIL/uL — ABNORMAL LOW (ref 3.87–5.11)
RDW: 21.3 % — ABNORMAL HIGH (ref 11.5–15.5)
WBC: 10.4 10*3/uL (ref 4.0–10.5)
nRBC: 0.2 % (ref 0.0–0.2)

## 2019-11-22 LAB — BASIC METABOLIC PANEL
Anion gap: 3 — ABNORMAL LOW (ref 5–15)
BUN: 74 mg/dL — ABNORMAL HIGH (ref 8–23)
CO2: 22 mmol/L (ref 22–32)
Calcium: 6.6 mg/dL — ABNORMAL LOW (ref 8.9–10.3)
Chloride: 115 mmol/L — ABNORMAL HIGH (ref 98–111)
Creatinine, Ser: 0.49 mg/dL (ref 0.44–1.00)
GFR calc Af Amer: >60 mL/min (ref >60)
GFR calc non Af Amer: >60 mL/min (ref >60)
Glucose, Bld: 93 mg/dL (ref 70–99)
Potassium: 4.3 mmol/L (ref 3.5–5.1)
Sodium: 140 mmol/L (ref 135–145)

## 2019-11-22 LAB — MAGNESIUM: Magnesium: 1.6 mg/dL — ABNORMAL LOW (ref 1.7–2.4)

## 2019-11-22 LAB — PHOSPHORUS: Phosphorus: 2.4 mg/dL — ABNORMAL LOW (ref 2.5–4.6)

## 2019-11-22 LAB — PREPARE RBC (CROSSMATCH)

## 2019-11-22 MED ORDER — SODIUM CHLORIDE 0.9% IV SOLUTION: Freq: Once | INTRAVENOUS | Status: DC

## 2019-11-22 MED ORDER — MAGNESIUM SULFATE 2 GM/50ML IV SOLN
2.0000 g | Freq: Once | INTRAVENOUS | Status: AC
  Administered 2019-11-22: 04:00:00 2 g via INTRAVENOUS
  Filled 2019-11-22: qty 50

## 2019-11-22 MED ORDER — MAGNESIUM SULFATE 2 GM/50ML IV SOLN: 2.0000 g | Freq: Once | INTRAVENOUS | Status: DC

## 2019-11-22 MED ORDER — COLLAGENASE 250 UNIT/GM EX OINT
TOPICAL_OINTMENT | Freq: Every day | CUTANEOUS | Status: DC
  Administered 2019-11-23 – 2019-11-25 (×3): 1 via TOPICAL
  Filled 2019-11-22: qty 30

## 2019-11-22 MED ORDER — FREE WATER
100.0000 mL | Status: DC
  Administered 2019-11-22 – 2019-11-26 (×24): 100 mL

## 2019-11-22 MED ORDER — MAGNESIUM SULFATE IN D5W 1-5 GM/100ML-% IV SOLN
1.0000 g | Freq: Once | INTRAVENOUS | Status: AC
  Administered 2019-11-23: 1 g via INTRAVENOUS
  Filled 2019-11-22: qty 100

## 2019-11-22 NOTE — Progress Notes (Signed)
Called to start peripheral I.V. on patient with bilateral Arms 3+ edema blistered and weeping. Multiple attempts with and without ultrasound made. Due to need for blood and Hgb reported to be 5.0 . I.V. started in a peripheral vein near midline. Area around midline edematous,midline with great blood return with a little  Tension on extension.

## 2019-11-22 NOTE — Progress Notes (Signed)
[place] Progress Note Patient Name: Barbara Flowers DOB: 1951/09/07 MRN: [id number]   Date of Service  11/22/2019  HPI/Events of Note  Platelet count = 219 --> 84.  eICU Interventions  Will recheck CBC with Platelets at 8 AM.      Intervention Category Major Interventions: Other:  Lysle Dingwall 11/22/2019, 5:39 AM

## 2019-11-22 NOTE — Progress Notes (Signed)
[place] [place] Progress Note Patient Name: Barbara Flowers DOB: May 23, 1951 MRN: [id number]   Date of Service  11/22/2019  HPI/Events of Note  Anemia - Hgb = 5.1 and Mg++ = 1.6 and Creatinine = 0.49.  eICU Interventions  Will order: 1. Transfuse 2 units PRBC. 2. Replace Mg++.      Intervention Category Major Interventions: Electrolyte abnormality - evaluation and management;Other:  Lysle Dingwall 11/22/2019, 3:53 AM

## 2019-11-22 NOTE — Progress Notes (Addendum)
Updated husband at bedside. Discussed interval changes as well as ongoing concerns with IV access, difficult lab draws. We agree to minimize lab draws at this time and order PRN to decrease patient's discomfort given overall plan and state of clinical course. Discussed with son, Jenny Reichmann via phone as well. All questions answered.   P -Limit lab draws to PRN -will order 1g mag for 3/4 as mag has been somewhat consistently sub-optimal and I feel it is reasonable to anticipate need for replacement without checking AM mag -Will not follow H/H closely after current PRBC transfusion. Anemia likely in setting of chronic disease, critical illness and fq of lab draws. Limiting labs is likely one of the more beneficial interventions I can offer at this juncture. If hypotensive, can check POC H/H, but do not feel great utility in aggressively trending  -Plan for 1-way extubation this weekend (3/6?) when family arrives from out of town. Family is understanding that patient is at EOL.  Eliseo Gum MSN, AGACNP-BC [place] [phone] If no answer, [id number] 11/22/2019, 10:59 AM

## 2019-11-22 NOTE — Consult Note (Signed)
[place] Nurse wound follow up Wound type: 1. Stage 4 Pressure Injury: sacrum 2. Unstageable Pressure Injury: left lateral foot 3. Unstageable Pressure Injury: left ischium Left thigh and right ear have healed  4. New onset of MASD (intertriginous) moisture associated skin damage; 2 linear partial thickness wounds along the left side under the pannus   Measurement: 1. 8cm x 11cm x 2cm with undermining from 7-12 o'clock; 100% pink some darkening of the proximal wound bed, clean, non granular, new onset of skin necrosis at 1-2 o'clock  2. 0.7cm x 0.7cm x 0cm; 100% purple/black hard eschar 3. 3.0cm x 2.5cm x 1.0; 100% yellow 4. Linear areas; 0.2cm x 1.0cm x 0.1cm and 0.3cm x 0.2cm x 0.1cm; both 100% clean, pink, moist  Wound bed: see above   Drainage (amount, consistency, odor) none from the left lateral foot; minimal from the left ischium and sacral wound. No odor  Periwound: intact   Dressing procedure/placement/frequency: Add antimicrobial wicking fabric under the pannus to address moisture and open wounds Add enzymatic debridement to the left ischial wound Continue saline moist dressings to the sacrum Continue silicone foam to the left foot. Protect heels with silicone foam Continue use of Prevalon boots for offloading  Continue LALM for moisture management and pressure redistribution Flexiseal and FC in place for management of incontinence TF in place for nutritional supplementation  Bedside nursing to update husband when he arrives about my assessment today.   [facility] Nurse team will follow with you and see patient within 10 days for wound assessments.  Please notify [facility] nurses of any acute changes in the wounds or any new areas of concern [place] MSN, [place], [place], [place]

## 2019-11-22 NOTE — Progress Notes (Signed)
NAME:  Barbara Flowers, MRN:  [id number], DOB:  Feb 10, 1951, LOS: 28 ADMISSION DATE:  11/10/2019, CONSULTATION DATE:  11/05/2019 REFERRING MD: Gardenia Phlegm MD, CHIEF COMPLAINT: Septic shock  Brief History   69 year old with complicated history as noted below with recent admission from 12/26 to 10/21/2019 for respiratory failure, MRSA pneumonia, CHF.  Returns to hospital with low urine output, altered mental status. She was started on levaquin as an outpatient for UTI but continued to worsen. In ED she has septic shock from indwelling blocked Foley (which was replaced), urosepsis with AKI, hyperkalemia.   Past Medical History  Breast cancer status postmastectomy, brain aneurysm, dementia, CVA with left hemiplegia, cardiac contractures, stage IV sacral decub, Shy-Drager syndrome, LV thrombus, paroxysmal atrial fibrillation/flutter   has a past surgical history that includes Lymph node biopsy; Hand surgery (Left, 2015); Gastric bypass (1985); Cholecystectomy; Craniotomy for hemispherectomy total / partial (Right, 2010); Breast lumpectomy (Left, 1991); Hand tendon surgery (Left); Bone marrow transplant (1991); Laparoscopic gastrostomy (N/A, 10/18/2019); Laparoscopic lysis of adhesions (N/A, 10/18/2019); and Incisional hernia repair (N/A, 10/18/2019).  Significant Hospital Events   2/20- Admit amount peripheral levo. Urine culture enterococcus. Renal US - no hyror 2/21 - rapid A. fib-given amnio bolus 2.22 - Rt midline cath  2/23 - Off pressors.  Some moaning in pain per RN.  Scr slightly improved. -0> CCM signed off with recommendation for hospice 2/25 -rushed emergently to ICU from floor. Comatose and resp distress and hypoxemic. CXR with L sided collapse. Elink called CRNA and patient intubated without any RSI medications. Post  Intubation CXR shows resolution of collapse. BP ok. CCM recalled.  2/27 am freq PAF   > added beta blocker> bradycardic pm 2/27 so d/c'd  3/2 rapid afib, amio  restarted  Consults:  PCCM  Palliative  Procedures:    Significant Diagnostic Tests:  Renal ultrasound 2/20-no hydronephrosis  Micro Data:  Blood cultures 2/20- Urine Cultures 2/20- enterococcus  MRSA PCR 2/20 > Positive  Trach 2/26 > abundant polys few gpcs no org seen >>>  MRSA   Antimicrobials:  Flagyl 2/21 >> 2/28 Cefepime 2/21 >> 2/28  Vanco 2/21 >3/1 Zyvox 3/2>   Interim history/subjective:  Hgb 6.7 this morning, 1 PRBC ordered   Objective   Blood pressure (!) 120/44, pulse 65, temperature (!) 97.2 F (36.2 C), temperature source Axillary, resp. rate 20, height 5\' 8"  (1.727 m), weight 71.6 kg, SpO2 100 %.    Vent Mode: PRVC FiO2 (%):  [30 %] 30 % Set Rate:  [20 bmp] 20 bmp Vt Set:  [510 mL] 510 mL PEEP:  [5 cmH20] 5 cmH20 Pressure Support:  [10 cmH20] 10 cmH20 Plateau Pressure:  [13 cmH20-17 cmH20] 14 cmH20   Intake/Output Summary (Last 24 hours) at 11/22/2019 0739 Last data filed at 11/22/2019 0600 Gross per 24 hour  Intake 2842.61 ml  Output 2050 ml  Net 792.61 ml   Filed Weights   11/20/19 0449 11/21/19 0500 11/22/19 0500  Weight: 71.3 kg 71.6 kg 71.6 kg   Physical exam  Gen:  Chronically and critically ill appearing frail elderly Female, intubated and off sedation, NAD HEENT:  Temporal muscle wasting. ETT secure. Trachea midline. Anicteric sclera Lungs:  Symmetrical chest expansion. No accessory muscle use, compliant with vent. CTA bilaterally.  CV:  RRR s1s2 no rgm. Cap refill <3 seconds  Abd:  Soft round, ndnt. + PEG  Ext: Muscle wasting symmetrical. BUE pitting edema. BLE trace edema. No obvious joint deformity.  Skin: Pale. c/d/w. Scattered ecchymosis  on chest, BUE BLE.  Neuro: Somnolent. Opens eyes to stimulation. Weakly moving head, intermittently to command. Intermittently tracking   Resolved Hospital Problem list   Shock  Assessment & Plan:   Acute metabolic encephalopathy with history of underlying dementia/severe debility  - intubated  without sedation meds and only minimal tracking now  P  Delirium precautions Minimize sedation as able   Acute hypoxic respiratory failure - requiring intubation 11/17/19 Recurrent aspiration   MRSA pneumonia -Secondary to mucus plugging and left lung collapse related to poor clearance P Failed SBT 3/3 due to 0 patient effort Follow intermittent chest x-ray Continue VAP, pulm hygiene  Zyvox  Tentative plan seems to be 1-way extubation when family arrives this weekend (?3/6)   Septic shock secondary to UTI from indwelling Foley's and Question Component of Infection secondary to sacral wound and ? MRSA pna -shock improved  P ICU monitoring  Continue zyvox as above   Chronic hypotension, Orthostatic Hypotension, Autonomic Dysfunction  P Holding home midodrine due to hypertension  Chronic combined biventricular heart failure  Paroxysmal atrial fibrillation with RVR on 2/2 -EF 45-50 10/19/2019 P Telemetry monitoring Amiodarone  Holding home beta blocker due to episodic bradycardia   AKI secondary to septic shock and urinary retention > improving P Trend renal indices and UOP   Anemia of chronic disease -3/3 overnight hbg 5.1. 2 PRBC ordered but before could be given, f/u Hgb 6.7.  P Hgb 6.7, 1 PRBC   Hypernatremia, improved  Hypokalemia, improved  Hypomagnesemia  P Mag replaced 3/3 overnight  Decreasing FWF  Pressure ulcers: Sacral decub stage IV present on admission  Unstageable left foot, present on admission  P WOC EN  Failure to Thrive Chronic protein malnutrition, severe Severe dysphagia  Plan EN per PEG  Difficult IV Access Unable to obtain PICC P  Maintain current Midline, PIV  Do not think it is reasonable to put patient through CVC placement If labs continue to be difficult to obtain, I will discuss limiting labs with husband as I do not think trending labs aggressively is adding overall benefit for patient   Goal of care Very poor prognosis  and ongoing care is futile, patient is slowly dying. I feel we are actively contributing to ongoing suffering. Currently Limited code. Tentative plan is 1-way extubation 3/5? 3/6? When out of town family comes into town, but family desires continued heroic efforts until this time   Building surveyor:  Diet: Tube feed DVT prophylaxis: Heparin SQ GI prophylaxis: PPI Glucose control: [place] Mobility: Bed Code Status: Full   Family Communication: Pending 3/3 Disposition:  ICU Status   CRITICAL CARE Performed by: Cristal Generous   Total critical care time: 38 minutes  Critical care time was exclusive of separately billable procedures and treating other patients. Critical care was necessary to treat or prevent imminent or life-threatening deterioration.  Critical care was time spent personally by me on the following activities: development of treatment plan with patient and/or surrogate as well as nursing, discussions with consultants, evaluation of patient's response to treatment, examination of patient, obtaining history from patient or surrogate, ordering and performing treatments and interventions, ordering and review of laboratory studies, ordering and review of radiographic studies, pulse oximetry and re-evaluation of patient's condition.  Eliseo Gum MSN, AGACNP-BC [place] [phone] If no answer, [id number] 11/22/2019, 7:40 AM

## 2019-11-23 LAB — GLUCOSE, CAPILLARY
Glucose-Capillary: 79 mg/dL (ref 70–99)
Glucose-Capillary: 100 mg/dL — ABNORMAL HIGH (ref 70–99)
Glucose-Capillary: 109 mg/dL — ABNORMAL HIGH (ref 70–99)
Glucose-Capillary: 100 mg/dL — ABNORMAL HIGH (ref 70–99)
Glucose-Capillary: 83 mg/dL (ref 70–99)
Glucose-Capillary: 96 mg/dL (ref 70–99)

## 2019-11-23 LAB — CULTURE, RESPIRATORY W GRAM STAIN

## 2019-11-23 MED ORDER — AMIODARONE HCL 200 MG PO TABS
200.0000 mg | ORAL_TABLET | Freq: Two times a day (BID) | ORAL | Status: DC
  Administered 2019-11-23 – 2019-11-26 (×7): 200 mg via ORAL
  Filled 2019-11-23 (×7): qty 1

## 2019-11-23 NOTE — Progress Notes (Signed)
Foley continues to leak, removed and replaced with #16 F foley

## 2019-11-23 NOTE — Progress Notes (Signed)
Patient foley was found to be leaking. This nurse and Isidore Moos, RN assessed the foley together. Foley balloon was deflated and re-inflated with 13 mL of Normal saline. Will continue to monitor.

## 2019-11-23 NOTE — Progress Notes (Signed)
NAME:  Barbara Flowers, MRN:  [id number], DOB:  Sep 21, 1951, LOS: 12 ADMISSION DATE:  11/02/2019, CONSULTATION DATE:  11/04/2019 REFERRING MD: Gardenia Phlegm MD, CHIEF COMPLAINT: Septic shock  Brief History   69 year old with complicated history as noted below with recent admission from 12/26 to 10/21/2019 for respiratory failure, MRSA pneumonia, CHF.  Returns to hospital with low urine output, altered mental status. She was started on levaquin as an outpatient for UTI but continued to worsen. In ED she has septic shock from indwelling blocked Foley (which was replaced), urosepsis with AKI, hyperkalemia.   Past Medical History  Breast cancer status postmastectomy, brain aneurysm, dementia, CVA with left hemiplegia, cardiac contractures, stage IV sacral decub, Shy-Drager syndrome, LV thrombus, paroxysmal atrial fibrillation/flutter   has a past surgical history that includes Lymph node biopsy; Hand surgery (Left, 2015); Gastric bypass (1985); Cholecystectomy; Craniotomy for hemispherectomy total / partial (Right, 2010); Breast lumpectomy (Left, 1991); Hand tendon surgery (Left); Bone marrow transplant (1991); Laparoscopic gastrostomy (N/A, 10/18/2019); Laparoscopic lysis of adhesions (N/A, 10/18/2019); and Incisional hernia repair (N/A, 10/18/2019).  Significant Hospital Events   2/20- Admit amount peripheral levo. Urine culture enterococcus. Renal US - no hyror 2/21 - rapid A. fib-given amnio bolus 2.22 - Rt midline cath  2/23 - Off pressors.  Some moaning in pain per RN.  Scr slightly improved. -0> CCM signed off with recommendation for hospice 2/25 -rushed emergently to ICU from floor. Comatose and resp distress and hypoxemic. CXR with L sided collapse. Elink called CRNA and patient intubated without any RSI medications. Post  Intubation CXR shows resolution of collapse. BP ok. CCM recalled.  2/27 am freq PAF   > added beta blocker> bradycardic pm 2/27 so d/c'd  3/2 rapid afib, amio restarted   Consults:  PCCM  Palliative  Procedures:    Significant Diagnostic Tests:  Renal ultrasound 2/20-no hydronephrosis  Micro Data:  Blood cultures 2/20- Urine Cultures 2/20- enterococcus  MRSA PCR 2/20 > Positive  Trach 2/26 > abundant polys few gpcs no org seen >>>  MRSA   Antimicrobials:  Flagyl 2/21 >> 2/28 Cefepime 2/21 >> 2/28  Vanco 2/21 >3/1 Zyvox 3/2>   Interim history/subjective:  Hgb 6.7 this morning, 1 PRBC ordered   Objective   Blood pressure (!) 131/47, pulse 63, temperature 97.9 F (36.6 C), temperature source Axillary, resp. rate 19, height 5\' 8"  (1.727 m), weight 71.6 kg, SpO2 100 %.    Vent Mode: PRVC FiO2 (%):  [30 %] 30 % Set Rate:  [20 bmp] 20 bmp Vt Set:  [510 mL] 510 mL PEEP:  [5 cmH20] 5 cmH20 Plateau Pressure:  [13 cmH20-15 cmH20] 14 cmH20   Intake/Output Summary (Last 24 hours) at 11/23/2019 0741 Last data filed at 11/23/2019 0600 Gross per 24 hour  Intake 3027.54 ml  Output 700 ml  Net 2327.54 ml   Filed Weights   11/21/19 0500 11/22/19 0500 11/23/19 0355  Weight: 71.6 kg 71.6 kg 71.6 kg   Physical exam  GEN: fraily elderly woman on vent HEENT: ett in place with small thick secretions CV: RRR, ext warm PULM: Diminished bases, passive on vent, apneic with PS trials GI: Soft, +BS EXT: No edema NEURO: Will withdraw from pain, tracks with eyes, not following commands for me PSYCH: RASS -1 SKIN: Prominent chest wall veins, multiple pressure injuries, muscle wasting  Pressure Injury 11/10/2019 Sacrum Medial;Left Stage 4 - Full thickness tissue loss with exposed bone, tendon or muscle. wound w eschar (Active)  11/06/2019 2130  Location:  Sacrum  Location Orientation: Medial;Left  Staging: Stage 4 - Full thickness tissue loss with exposed bone, tendon or muscle.  Wound Description (Comments): wound w eschar  Present on Admission: Yes     Pressure Injury 11/06/2019 Foot Left;Lateral Unstageable - Full thickness tissue loss in which the base of the  injury is covered by slough (yellow, tan, gray, green or brown) and/or eschar (tan, brown or black) in the wound bed. (Active)  11/03/2019 2130  Location: Foot  Location Orientation: Left;Lateral  Staging: Unstageable - Full thickness tissue loss in which the base of the injury is covered by slough (yellow, tan, gray, green or brown) and/or eschar (tan, brown or black) in the wound bed.  Wound Description (Comments):   Present on Admission: Yes     Pressure Injury Ischial tuberosity Left (Active)     Location: Ischial tuberosity  Location Orientation: Left  Staging:   Wound Description (Comments):   Present on Admission:     Resolved Hospital Problem list   Shock  Assessment & Plan:  End stage dementia, deconditioning, autonomic dysfunction, heart failure, protein calorie malnutrition.  MRSA bronchitis vs. PNA.  Approaching end of life now vent dependent mostly due to weakness/apnea. - Awaiting family to come in 11/25/19 for palliative extubation - Limit lab draws in interim, meds aimed mostly for comfort - Linezolid x 4 more days then stop - No chest compressions/shocks, vent okay for now  Erskine Emery MD PCCM

## 2019-11-23 NOTE — Progress Notes (Signed)
Nutrition Follow-up  DOCUMENTATION CODES:   Not applicable  INTERVENTION:  - continue Vital High Protein @ 50 ml/hr with with 30 ml prostat once/day, juven BID. - free water flush to be per MD/NP.   NUTRITION DIAGNOSIS:   Inadequate oral intake related to inability to eat as evidenced by NPO status. -ongoing  GOAL:   Patient will meet greater than or equal to 90% of their needs -met with TF regimen  MONITOR:   Vent status, TF tolerance, Labs, Weight trends, Skin  ASSESSMENT:   69 year old with history of Breast cancer status postmastectomy (1990's), brain aneurysm, dementia, CVA with left hemiplegia, cardiac contractures, stage IV sacral decub, Shy-Drager syndrome, LV thrombus, atrial fibrillation/flutter, recurrent aspiration with PEG, with recent admission from 12/26 to 10/21/2019 for respiratory failure, MRSA pneumonia, CHF. Presents with septic shock from indwelling Foley (which was replaced), urosepsis with AKI, hyperkalemia.   Patient remains intubated with PEG in place. She is receiving TF at goal rate: Vital High Protein @ 50 ml/hr with with 30 ml prostat once/day, juven BID, and 100 ml free water every 4 hours. This regimen provides 1490 kcal (109% re-estimated kcal need), 125 grams protein, and 1603 ml free water. Weight has been stable over the past 3 days but is significantly up compared to admission (2/20) weight of 57.5 kg; used admission weight to re-estimate kcal need.   No family/visitors at bedside. Able to talk with RN who reports that patient is very edematous (flow sheet documentation indicates mild edema to BLE and deep pitting edema to BUE). She states that abdomen is soft, patient seems to be tolerating TF well, ongoing diarrhea with flexiseal in place.   Per notes: - end stage dementia and deconditioning - bronchitis vs PNA - approaching EOL with plan for palliative extubation with family present on 3/6   Patient is currently intubated on ventilator  support MV: 9.8 L/min Temp (24hrs), Avg:97.9 F (36.6 C), Min:96.9 F (36.1 C), Max:98.5 F (36.9 C) Propofol: none BP: 129/55 and MAP: 80    Labs reviewed; CBGs: 79 and 100 mg/dl, Cl: 115 mmol/l, BUN: 74 mg/dl, Ca: 6.6 mg/dl, Phos: 2.4 mg/dl, Mg: 1.6 mg/dl. Medications reviewed; sliding scale novolog, 40 mg protonix per PEG/day.    Diet Order:   Diet Order            Diet NPO time specified Except for: Ice Chips  Diet effective now              EDUCATION NEEDS:   No education needs have been identified at this time  Skin:  Skin Assessment: Skin Integrity Issues: Skin Integrity Issues:: Unstageable, Stage IV, Stage II Stage II: L thigh; L ear Stage IV: sacrum Unstageable: full thickness L foot  Last BM:  3/4  Height:   Ht Readings from Last 1 Encounters:  11/18/19 5' 8"  (1.727 m)    Weight:   Wt Readings from Last 1 Encounters:  11/23/19 71.6 kg    Ideal Body Weight:  63.6 kg  BMI:  Body mass index is 24 kg/m.  Estimated Nutritional Needs:   Kcal:  1361 kcal  Protein:  115-144 grams (2-2.5 grams/kg)  Fluid:  >/= 2 L/day     Jarome Matin, MS, RD, LDN, CNSC Inpatient Clinical Dietitian RD pager # available in [facility]  After hours/weekend pager # available in [facility]

## 2019-11-24 LAB — GLUCOSE, CAPILLARY
Glucose-Capillary: 71 mg/dL (ref 70–99)
Glucose-Capillary: 72 mg/dL (ref 70–99)
Glucose-Capillary: 82 mg/dL (ref 70–99)
Glucose-Capillary: 87 mg/dL (ref 70–99)

## 2019-11-24 NOTE — Progress Notes (Signed)
PMT no charge note  Chart reviewed. Call placed and discussed with husband.Barbara Flowers is thankful for the care the patient has received in the ICU and also appreciative of the close communication he has maintained with PCCM staff this week.    He stated that himself, his son and his daughter will be at the hospital between 10 to 29 AM on 11-25-2019. He is aware that a one way extubation will be carried out tomorrow. Recommended full scope of comfort measures, maximizing dignity and symptom management afterwards. He is in agreement. PMT to follow.   Loistine Chance MD [facility].

## 2019-11-24 NOTE — Progress Notes (Signed)
NAME:  Barbara Flowers, MRN:  [id number], DOB:  03/14/1951, LOS: 26 ADMISSION DATE:  11/17/2019, CONSULTATION DATE:  11/10/2019 REFERRING MD: Gardenia Phlegm MD, CHIEF COMPLAINT: Septic shock  Brief History   69 year old with complicated history as noted below with recent admission from 12/26 to 10/21/2019 for respiratory failure, MRSA pneumonia, CHF.  Returns to hospital with low urine output, altered mental status. She was started on levaquin as an outpatient for UTI but continued to worsen. In ED she has septic shock from indwelling blocked Foley (which was replaced), urosepsis with AKI, hyperkalemia.   Past Medical History  Breast cancer status postmastectomy, brain aneurysm, dementia, CVA with left hemiplegia, cardiac contractures, stage IV sacral decub, Shy-Drager syndrome, LV thrombus, paroxysmal atrial fibrillation/flutter   has a past surgical history that includes Lymph node biopsy; Hand surgery (Left, 2015); Gastric bypass (1985); Cholecystectomy; Craniotomy for hemispherectomy total / partial (Right, 2010); Breast lumpectomy (Left, 1991); Hand tendon surgery (Left); Bone marrow transplant (1991); Laparoscopic gastrostomy (N/A, 10/18/2019); Laparoscopic lysis of adhesions (N/A, 10/18/2019); and Incisional hernia repair (N/A, 10/18/2019).  Significant Hospital Events   2/20- Admit amount peripheral levo. Urine culture enterococcus. Renal US - no hyror 2/21 - rapid A. fib-given amnio bolus 2.22 - Rt midline cath  2/23 - Off pressors.  Some moaning in pain per RN.  Scr slightly improved. -0> CCM signed off with recommendation for hospice 2/25 -rushed emergently to ICU from floor. Comatose and resp distress and hypoxemic. CXR with L sided collapse. Elink called CRNA and patient intubated without any RSI medications. Post  Intubation CXR shows resolution of collapse. BP ok. CCM recalled.  2/27 am freq PAF   > added beta blocker> bradycardic pm 2/27 so d/c'd  3/2 rapid afib, amio  restarted  Consults:  PCCM  Palliative  Procedures:    Significant Diagnostic Tests:  Renal ultrasound 2/20-no hydronephrosis  Micro Data:  Blood cultures 2/20- Urine Cultures 2/20- enterococcus  MRSA PCR 2/20 > Positive  Trach 2/26 > abundant polys few gpcs no org seen >>>  MRSA   Antimicrobials:  Flagyl 2/21 >> 2/28 Cefepime 2/21 >> 2/28  Vanco 2/21 >3/1 Zyvox 3/2>   Interim history/subjective:  No events, more awake on vent and mouthing words.  Still apneic on SBT.   Objective   Blood pressure (!) 128/47, pulse (!) 59, temperature (!) 97.4 F (36.3 C), temperature source Axillary, resp. rate 12, height 5\' 8"  (1.727 m), weight 72 kg, SpO2 100 %.    Vent Mode: PRVC FiO2 (%):  [30 %] 30 % Set Rate:  [20 bmp] 20 bmp Vt Set:  [510 mL] 510 mL PEEP:  [5 cmH20] 5 cmH20 Pressure Support:  [10 cmH20] 10 cmH20 Plateau Pressure:  [13 cmH20-15 cmH20] 15 cmH20   Intake/Output Summary (Last 24 hours) at 11/24/2019 0738 Last data filed at 11/24/2019 [id number] Gross per 24 hour  Intake 1898.51 ml  Output 1701 ml  Net 197.51 ml   Filed Weights   11/22/19 0500 11/23/19 0355 11/24/19 0500  Weight: 71.6 kg 71.6 kg 72 kg   Physical exam  GEN: fraily elderly woman on vent HEENT: ett in place with few secretions CV: RRR, ext warm PULM: Diminished bases, passive on vent, apneic with PS trials GI: Soft, +BS EXT: global anasarca NEURO: Will withdraw from pain, tracks with eyes, not following commands for me PSYCH: RASS -1 SKIN: Prominent chest wall veins, multiple pressure injuries, muscle wasting  Pressure Injury 11/10/2019 Sacrum Medial;Left Stage 4 - Full thickness tissue loss  with exposed bone, tendon or muscle. wound w eschar (Active)  11/15/2019 2130  Location: Sacrum  Location Orientation: Medial;Left  Staging: Stage 4 - Full thickness tissue loss with exposed bone, tendon or muscle.  Wound Description (Comments): wound w eschar  Present on Admission: Yes     Pressure Injury  11/10/2019 Foot Left;Lateral Unstageable - Full thickness tissue loss in which the base of the injury is covered by slough (yellow, tan, gray, green or brown) and/or eschar (tan, brown or black) in the wound bed. (Active)  11/09/2019 2130  Location: Foot  Location Orientation: Left;Lateral  Staging: Unstageable - Full thickness tissue loss in which the base of the injury is covered by slough (yellow, tan, gray, green or brown) and/or eschar (tan, brown or black) in the wound bed.  Wound Description (Comments):   Present on Admission: Yes     Pressure Injury Ischial tuberosity Left (Active)     Location: Ischial tuberosity  Location Orientation: Left  Staging:   Wound Description (Comments):   Present on Admission:     Resolved Hospital Problem list   Shock  Assessment & Plan:  End stage dementia, deconditioning, autonomic dysfunction, heart failure, protein calorie malnutrition.  MRSA bronchitis vs. PNA.  Approaching end of life now vent dependent mostly due to weakness/apnea. - Awaiting family to come in 11/25/19 for palliative extubation - Limit lab draws in interim, meds aimed mostly for comfort - Linezolid x 3 more days then stop - PO amiodarone - No chest compressions/shocks, vent okay for now  Erskine Emery MD PCCM

## 2019-11-25 DIAGNOSIS — J9601 Acute respiratory failure with hypoxia: Secondary | ICD-10-CM

## 2019-11-25 DIAGNOSIS — R0602 Shortness of breath: Secondary | ICD-10-CM

## 2019-11-25 DIAGNOSIS — Z515 Encounter for palliative care: Secondary | ICD-10-CM

## 2019-11-25 LAB — GLUCOSE, CAPILLARY
Glucose-Capillary: 104 mg/dL — ABNORMAL HIGH (ref 70–99)
Glucose-Capillary: 64 mg/dL — ABNORMAL LOW (ref 70–99)
Glucose-Capillary: 73 mg/dL (ref 70–99)
Glucose-Capillary: 79 mg/dL (ref 70–99)
Glucose-Capillary: 82 mg/dL (ref 70–99)
Glucose-Capillary: 82 mg/dL (ref 70–99)
Glucose-Capillary: 86 mg/dL (ref 70–99)
Glucose-Capillary: 86 mg/dL (ref 70–99)

## 2019-11-25 MED ORDER — HALOPERIDOL LACTATE 5 MG/ML IJ SOLN: 0.5000 mg | INTRAMUSCULAR | Status: DC | PRN

## 2019-11-25 MED ORDER — DEXTROSE 50 % IV SOLN
INTRAVENOUS | Status: AC
  Administered 2019-11-25: 09:00:00 25 mL
  Filled 2019-11-25: qty 50

## 2019-11-25 MED ORDER — ONDANSETRON HCL 4 MG/2ML IJ SOLN: 4.0000 mg | Freq: Four times a day (QID) | INTRAMUSCULAR | Status: DC | PRN

## 2019-11-25 MED ORDER — ACETAMINOPHEN 325 MG PO TABS: 650.0000 mg | ORAL_TABLET | Freq: Four times a day (QID) | ORAL | Status: DC | PRN

## 2019-11-25 MED ORDER — GLYCOPYRROLATE 0.2 MG/ML IJ SOLN: 0.2000 mg | INTRAMUSCULAR | Status: DC | PRN

## 2019-11-25 MED ORDER — GLYCOPYRROLATE 1 MG PO TABS
1.0000 mg | ORAL_TABLET | ORAL | Status: DC | PRN
  Administered 2019-11-26 – 2019-11-30 (×3): 1 mg via ORAL
  Filled 2019-11-25 (×4): qty 1

## 2019-11-25 MED ORDER — HALOPERIDOL 0.5 MG PO TABS
0.5000 mg | ORAL_TABLET | ORAL | Status: DC | PRN
  Filled 2019-11-25: qty 1

## 2019-11-25 MED ORDER — ACETAMINOPHEN 650 MG RE SUPP: 650.0000 mg | Freq: Four times a day (QID) | RECTAL | Status: DC | PRN

## 2019-11-25 MED ORDER — LORAZEPAM 2 MG/ML PO CONC: 1.0000 mg | ORAL | Status: DC | PRN

## 2019-11-25 MED ORDER — LORAZEPAM 2 MG/ML IJ SOLN: 1.0000 mg | INTRAMUSCULAR | Status: DC | PRN

## 2019-11-25 MED ORDER — POLYVINYL ALCOHOL 1.4 % OP SOLN
1.0000 [drp] | Freq: Four times a day (QID) | OPHTHALMIC | Status: DC | PRN
  Filled 2019-11-25: qty 15

## 2019-11-25 MED ORDER — BIOTENE DRY MOUTH MT LIQD: 15.0000 mL | OROMUCOSAL | Status: DC | PRN

## 2019-11-25 MED ORDER — LORAZEPAM 2 MG/ML IJ SOLN
1.0000 mg | INTRAMUSCULAR | Status: DC | PRN
  Administered 2019-11-30: 1 mg via INTRAVENOUS
  Filled 2019-11-25 (×2): qty 1

## 2019-11-25 MED ORDER — HYDROMORPHONE HCL 1 MG/ML IJ SOLN
0.5000 mg | INTRAMUSCULAR | Status: DC | PRN
  Administered 2019-11-27 – 2019-12-02 (×14): 0.5 mg via INTRAVENOUS
  Filled 2019-11-25 (×3): qty 0.5
  Filled 2019-11-25: qty 1
  Filled 2019-11-25 (×11): qty 0.5

## 2019-11-25 MED ORDER — LORAZEPAM 1 MG PO TABS: 1.0000 mg | ORAL_TABLET | ORAL | Status: DC | PRN

## 2019-11-25 MED ORDER — SODIUM CHLORIDE 0.9 % IV SOLN: INTRAVENOUS | Status: DC

## 2019-11-25 MED ORDER — GLYCOPYRROLATE 0.2 MG/ML IJ SOLN
0.2000 mg | INTRAMUSCULAR | Status: DC | PRN
  Administered 2019-11-25 – 2019-12-02 (×3): 0.2 mg via INTRAVENOUS
  Filled 2019-11-25 (×3): qty 1

## 2019-11-25 MED ORDER — MORPHINE SULFATE (PF) 4 MG/ML IV SOLN: 4.0000 mg | INTRAVENOUS | Status: DC | PRN

## 2019-11-25 MED ORDER — ONDANSETRON 4 MG PO TBDP: 4.0000 mg | ORAL_TABLET | Freq: Four times a day (QID) | ORAL | Status: DC | PRN

## 2019-11-25 MED ORDER — HALOPERIDOL LACTATE 2 MG/ML PO CONC
0.5000 mg | ORAL | Status: DC | PRN
  Filled 2019-11-25: qty 0.3

## 2019-11-25 NOTE — Progress Notes (Signed)
ONE WAY EXTUBATION PER DOCTOR AND FAMILY. PLACED PT ON 2L [place] SATS 100%. PT IS STABLE. RT WILL CONTINUE TO MONIOTR

## 2019-11-25 NOTE — Progress Notes (Signed)
NAME:  Barbara Flowers, MRN:  [id number], DOB:  [birth year], LOS: 50 ADMISSION DATE:  10/31/2019, CONSULTATION DATE:  11/09/2019 REFERRING MD: Gardenia Phlegm MD, CHIEF COMPLAINT: Septic shock  Brief History   69 year old with complicated history as noted below with recent admission from 12/26 to 10/21/2019 for respiratory failure, MRSA pneumonia, CHF.  Returns to hospital with low urine output, altered mental status. She was started on levaquin as an outpatient for UTI but continued to worsen. In ED she has septic shock from indwelling blocked Foley (which was replaced), urosepsis with AKI, hyperkalemia.   Past Medical History  Breast cancer status postmastectomy, brain aneurysm, dementia, CVA with left hemiplegia, cardiac contractures, stage IV sacral decub, Shy-Drager syndrome, LV thrombus, paroxysmal atrial fibrillation/flutter   has a past surgical history that includes Lymph node biopsy; Hand surgery (Left, 2015); Gastric bypass (1985); Cholecystectomy; Craniotomy for hemispherectomy total / partial (Right, 2010); Breast lumpectomy (Left, 1991); Hand tendon surgery (Left); Bone marrow transplant (1991); Laparoscopic gastrostomy (N/A, 10/18/2019); Laparoscopic lysis of adhesions (N/A, 10/18/2019); and Incisional hernia repair (N/A, 10/18/2019).  Significant Hospital Events   2/20- Admit amount peripheral levo. Urine culture enterococcus. Renal US - no hyror 2/21 - rapid A. fib-given amnio bolus 2.22 - Rt midline cath  2/23 - Off pressors.  Some moaning in pain per RN.  Scr slightly improved. -0> CCM signed off with recommendation for hospice 2/25 -rushed emergently to ICU from floor. Comatose and resp distress and hypoxemic. CXR with L sided collapse. Elink called CRNA and patient intubated without any RSI medications. Post  Intubation CXR shows resolution of collapse. BP ok. CCM recalled.  2/27 am freq PAF   > added beta blocker> bradycardic pm 2/27 so d/c'd  3/2 rapid afib, amio restarted   Consults:  PCCM  Palliative  Procedures:    Significant Diagnostic Tests:  Renal ultrasound 2/20-no hydronephrosis  Micro Data:  Blood cultures 2/20- Urine Cultures 2/20- enterococcus  MRSA PCR 2/20 > Positive  Trach 2/26 > abundant polys few gpcs no org seen >>>  MRSA   Antimicrobials:  Flagyl 2/21 >> 2/28 Cefepime 2/21 >> 2/28  Vanco 2/21 >3/1 Zyvox 3/2>   Interim history/subjective:  No events, awake on vent.  Husband at bedside. Low sugar this AM needed D50.  Objective   Blood pressure (!) 160/55, pulse 60, temperature 98.5 F (36.9 C), temperature source Axillary, resp. rate 20, height 5\' 8"  (1.727 m), weight 79 kg, SpO2 100 %.    Vent Mode: PRVC FiO2 (%):  [30 %] 30 % Set Rate:  [20 bmp] 20 bmp Vt Set:  [510 mL] 510 mL PEEP:  [5 cmH20] 5 cmH20 Plateau Pressure:  [14 cmH20-19 cmH20] 14 cmH20   Intake/Output Summary (Last 24 hours) at 11/25/2019 [id number] Last data filed at 11/25/2019 0932 Gross per 24 hour  Intake 3201.35 ml  Output 2800 ml  Net 401.35 ml   Filed Weights   11/23/19 0355 11/24/19 0500 11/25/19 0402  Weight: 71.6 kg 72 kg 79 kg   Physical exam  GEN: fraily elderly woman on vent HEENT: ett in place with few secretions CV: RRR, ext warm PULM: Diminished bases, passive on vent, apneic with PS trials GI: Soft, +BS EXT: global anasarca NEURO: following commands this AM PSYCH: RASS -1 SKIN: Prominent chest wall veins, multiple pressure injuries, muscle wasting  Pressure Injury 11/01/2019 Sacrum Medial;Left Stage 4 - Full thickness tissue loss with exposed bone, tendon or muscle. wound w eschar (Active)  11/10/2019 2130  Location: Sacrum  Location Orientation: Medial;Left  Staging: Stage 4 - Full thickness tissue loss with exposed bone, tendon or muscle.  Wound Description (Comments): wound w eschar  Present on Admission: Yes     Pressure Injury 11/05/2019 Foot Left;Lateral Unstageable - Full thickness tissue loss in which the base of the injury is  covered by slough (yellow, tan, gray, green or brown) and/or eschar (tan, brown or black) in the wound bed. (Active)  11/15/2019 2130  Location: Foot  Location Orientation: Left;Lateral  Staging: Unstageable - Full thickness tissue loss in which the base of the injury is covered by slough (yellow, tan, gray, green or brown) and/or eschar (tan, brown or black) in the wound bed.  Wound Description (Comments):   Present on Admission: Yes     Pressure Injury Ischial tuberosity Left (Active)     Location: Ischial tuberosity  Location Orientation: Left  Staging:   Wound Description (Comments):   Present on Admission:     Resolved Hospital Problem list   Shock  Assessment & Plan:  End stage dementia, deconditioning, autonomic dysfunction, heart failure, protein calorie malnutrition.  MRSA bronchitis vs. PNA.  Approaching end of life now vent dependent mostly due to weakness/apnea. - Awaiting family to come in today for palliative extubation - Limit lab draws in interim, meds aimed mostly for comfort - Linezolid x 2 more days then stop - PO amiodarone - No chest compressions/shocks, vent okay for now - Will speak with family by E-link when they come in to answer all questions  Erskine Emery MD PCCM

## 2019-11-25 NOTE — Progress Notes (Signed)
I attempted to visit w/pt's family and was told they left shortly after extubation. Please page if additional assistance is needed. [place], MDiv   11/25/19 1500  Clinical Encounter Type  Visited With Health care provider

## 2019-11-25 NOTE — Progress Notes (Addendum)
Patient CBG this AM was 64, per hypoglycemic standing  protocol give half amp D50. D50, 63mL given at 0838. Will recheck CBG in 30 minutes.   CBG recheck 104

## 2019-11-25 NOTE — Progress Notes (Signed)
Daily Progress Note   Patient Name: Barbara Flowers       Date: 11/25/2019 DOB: 08-12-1951  Age: 69 y.o. MRN#: [id number] Attending Physician: Candee Furbish, MD Primary Care Physician: [name], NP Admit Date: 11/07/2019  Reason for Consultation/Follow-up: Terminal Care and Withdrawal of life-sustaining treatment  Subjective:  awake on the vent. In no distress, family at bedside.   Length of Stay: 14  Current Medications: Scheduled Meds:   amiodarone  200 mg Oral BID   chlorhexidine gluconate (MEDLINE KIT)  15 mL Mouth Rinse BID   Chlorhexidine Gluconate Cloth  6 each Topical Daily   collagenase   Topical Daily   feeding supplement (PRO-STAT SUGAR FREE 64)  30 mL Per Tube Daily   free water  100 mL Per Tube Q4H   insulin aspart  0-9 Units Subcutaneous Q4H   mouth rinse  15 mL Mouth Rinse 10 times per day   mupirocin ointment   Nasal BID   nutrition supplement (JUVEN)  1 packet Per Tube BID BM   pantoprazole sodium  40 mg Per Tube Daily   sodium chloride flush  10-40 mL Intracatheter Q12H    Continuous Infusions:  sodium chloride Stopped (11/21/19 0702)   sodium chloride     feeding supplement (VITAL HIGH PROTEIN) Stopped (11/25/19 1101)   linezolid (ZYVOX) IV Stopped (11/25/19 1010)    PRN Meds: acetaminophen (TYLENOL) oral liquid 160 mg/5 mL, antiseptic oral rinse, fentaNYL (SUBLIMAZE) injection, glycopyrrolate **OR** glycopyrrolate **OR** glycopyrrolate, haloperidol **OR** haloperidol **OR** haloperidol lactate, hydrALAZINE, HYDROmorphone (DILAUDID) injection, lip balm, LORazepam **OR** LORazepam **OR** LORazepam, LORazepam, morphine injection, ondansetron **OR** ondansetron (ZOFRAN) IV, polyvinyl alcohol, sodium chloride flush  Physical Exam           Frail appearing lady No distress On the vent Has anasarca Has muscle wasting Monitor noted  Vital Signs: BP (!) 137/47    Pulse 63    Temp 98.5 F (36.9 C) (Axillary)    Resp 20    Ht 5' 8"  (1.727 m)    Wt 79 kg    SpO2 100%    BMI 26.48 kg/m  SpO2: SpO2: 100 % O2 Device: O2 Device: Ventilator O2 Flow Rate: O2 Flow Rate (L/min): 100 L/min  Intake/output summary:   Intake/Output Summary (Last 24 hours) at 11/25/2019 1110 Last data filed  at 11/25/2019 1007 Gross per 24 hour  Intake 3325.13 ml  Output 2800 ml  Net 525.13 ml   LBM: Last BM Date: 11/25/19 Baseline Weight: Weight: 57.5 kg Most recent weight: Weight: 79 kg       Palliative Assessment/Data:    Flowsheet Rows     Most Recent Value  Intake Tab  Referral Department  Critical care  Unit at Time of Referral  ICU  Palliative Care Primary Diagnosis  Sepsis/Infectious Disease  Date Notified  11/12/19  Palliative Care Type  Return patient Palliative Care  Reason for referral  Clarify Goals of Care  Date of Admission  11/10/2019  Date first seen by Palliative Care  11/12/19  # of days Palliative referral response time  0 Day(s)  # of days IP prior to Palliative referral  1  Clinical Assessment  Psychosocial & Spiritual Assessment  Palliative Care Outcomes      Patient Active Problem List   Diagnosis Date Noted   Acute respiratory distress    Enterococcus faecalis infection 11/16/2019   Septic shock ([place]) 11/18/2019   Left spastic hemiparesis with h/o strokes 10/19/2019   Malnutrition of moderate degree 10/19/2019   Decreased functional mobility 10/17/2019   Sacral decubitus ulcer, stage IV ([place]) 10/17/2019   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/17/2019   Dysphagia 10/17/2019   Kyphoscoliosis 10/17/2019   H/O bone marrow transplant ([place])    Palliative care by specialist    Goals of care, counseling/discussion    Generalized weakness    Failure to thrive in adult    Acute respiratory  failure (HCC)    Pleural effusion    Acute systolic heart failure ([place])    Protein-calorie malnutrition, severe ([place]) 09/24/2019   Pressure injury of skin 09/17/2019   CAP (community acquired pneumonia) 09/16/2019   Pleural effusion on left 09/16/2019   Acute prerenal azotemia 09/16/2019   Hypernatremia 09/16/2019   Altered mental status, unspecified 07/12/2017   Diarrhea 05/24/2017   Avascular necrosis of bone of hip, right ([place]) 11/23/2016   Memory difficulty 11/23/2016   Hallucination, visual 11/23/2016   Cervical myelopathy ([place]) 06/13/2016   Hypokalemia 06/12/2016   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[place]) 06/12/2016   Parkinson's disease ([place]) 06/11/2016   GI bleed 06/09/2016   Chronic right hip pain 01/10/2016   Physical debility 01/10/2016   Wheelchair dependent 2017- 10/17/2015   TIA (transient ischemic attack) 08/31/2015   History of CVA with residual deficit left side 08/31/2015   Hypothyroidism 08/31/2015   OSA on CPAP 08/31/2015   Dementia ([place]) 08/31/2015   Chronic orthostatic hypotension 08/31/2015   OAB (overactive bladder) 08/31/2015   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   Peripheral neuropathy 08/31/2015   Avascular necrosis of right femoral head ([place]) 08/31/2015   Left carotid bruit 08/31/2015   Anemia 08/31/2015    Palliative Care Assessment & Plan   Patient Profile:    Assessment:  End stage dementia, deconditioning, autonomic dysfunction, heart failure, protein calorie malnutrition.  MRSA bronchitis vs. PNA.  Approaching end of life now vent dependent mostly due to weakness/apnea PPS 20%  Recommendations/Plan:   family meeting: I met with the patient's son outside the room, subsequently I also interacted face to face with the patient's husband, son, daughter and several of her other close family members inside the patient's room.   Palliative medicine is specialized medical care for people living with serious illness. It  focuses on providing relief from the symptoms and stress of a serious illness. The goal is to  improve quality of life for both the patient and the family.  Goals of care: Broad aims of medical therapy in relation to the patient's values and preferences. Our aim is to provide medical care aimed at enabling patients to achieve the goals that matter most to them, given the circumstances of their particular medical situation and their constraints.   We discussed about compassionate extubation being a procedure in which mechanical ventilation is terminated, ETT is removed. Upon withdrawal of mechanical means, all efforts are made to judiciously use medications such as opioids, benzodiazepines, anti secretory medications etc for establishing comfort measures and for avoiding suffering and distressing symptoms. We discussed that prognosis after one way extubation could be as short as minutes to hours to as long as hours to some very limited number of days. Some patients may take some spontaneous breaths while others don't. Patient might be transferred to a non telemetry floor if appropriate and subsequently to a hospice facility if appropriate. If there is a high symptom burden, then all efforts will be made of escalate comfort measures in the hospital at which point an in hospital death is anticipated. On call chaplain is available.   All of the patient's family's questions and concerns have been addressed to the best of my ability. Anticipate one way extubation later this morning after family spends some time with the patient at her bedside. Continue to finish antibiotic course.   PMT to follow.   Goals of Care and Additional Recommendations:  Limitations on Scope of Treatment: Full Comfort Care  Code Status:    Code Status Orders  (From admission, onward)         Start     Ordered   11/25/19 1109  Do not attempt resuscitation (DNR)  Continuous    Question Answer Comment  In the event of cardiac  or respiratory ARREST Do not call a code blue   In the event of cardiac or respiratory ARREST Do not perform Intubation, CPR, defibrillation or ACLS   In the event of cardiac or respiratory ARREST Use medication by any route, position, wound care, and other measures to relive pain and suffering. May use oxygen, suction and manual treatment of airway obstruction as needed for comfort.      11/25/19 1109        Code Status History    Date Active Date Inactive Code Status Order ID Comments User Context   11/23/2019 0749 11/25/2019 1109 Partial Code [id number]  Candee Furbish, MD Inpatient   11/19/2019 1526 11/23/2019 0749 Partial Code [id number]  Micheline Rough, MD Inpatient   10/28/2019 1800 11/19/2019 1526 Full Code [id number]  Marshell Garfinkel, MD ED   09/16/2019 1740 10/21/2019 2017 Full Code [id number]  Tanda Rockers, MD ED   05/24/2017 2004 05/28/2017 2035 Full Code [id number]  Etta Quill, DO ED   08/31/2015 1641 09/02/2015 2016 Full Code [id number]  Samella Parr, NP Inpatient   Advance Care Planning Activity    Advance Directive Documentation     Most Recent Value  Type of Advance Directive  Healthcare Power of Attorney, Living will  Pre-existing out of facility DNR order (yellow form or pink MOST form)  --  "MOST" Form in Place?  --       Prognosis:   Hours - Days  Discharge Planning:  Anticipated Hospital Death  Care plan was discussed with  Patient's husband, son, daughter and other family members who are at the bedside.   Thank you for  allowing the Palliative Medicine Team to assist in the care of this patient.   Time In: 10 Time Out: 11.05 Total Time  65 Prolonged Time Billed Yes       Greater than 50%  of this time was spent counseling and coordinating care related to the above assessment and plan.  Loistine Chance, MD  Please contact Palliative Medicine Team phone at [phone] for questions and concerns.

## 2019-11-26 DIAGNOSIS — R1310 Dysphagia, unspecified: Secondary | ICD-10-CM

## 2019-11-26 LAB — GLUCOSE, CAPILLARY
Glucose-Capillary: 72 mg/dL (ref 70–99)
Glucose-Capillary: 68 mg/dL — ABNORMAL LOW (ref 70–99)
Glucose-Capillary: 99 mg/dL (ref 70–99)
Glucose-Capillary: 74 mg/dL (ref 70–99)

## 2019-11-26 LAB — TYPE AND SCREEN
ABO/RH(D): A POS
Antibody Screen: NEGATIVE
Unit division: 0
Unit division: 0

## 2019-11-26 LAB — BPAM RBC
Blood Product Expiration Date: 202103282359
Blood Product Expiration Date: 202103292359
ISSUE DATE / TIME: 202103030742
Unit Type and Rh: 6200
Unit Type and Rh: 6200

## 2019-11-26 MED ORDER — HYDROCORTISONE 20 MG PO TABS
20.0000 mg | ORAL_TABLET | Freq: Every day | ORAL | Status: DC
  Administered 2019-11-26: 20 mg via ORAL
  Filled 2019-11-26: qty 1

## 2019-11-26 MED ORDER — DEXTROSE 50 % IV SOLN
50.0000 mL | Freq: Once | INTRAVENOUS | Status: AC
  Administered 2019-11-26: 50 mL via INTRAVENOUS
  Filled 2019-11-26: qty 50

## 2019-11-26 MED ORDER — HYDROCORTISONE 10 MG PO TABS: 10.0000 mg | ORAL_TABLET | Freq: Every evening | ORAL | Status: DC

## 2019-11-26 MED ORDER — DEXTROSE 50 % IV SOLN
INTRAVENOUS | Status: AC
  Filled 2019-11-26: qty 50

## 2019-11-26 MED ORDER — DEXTROSE 50 % IV SOLN
12.5000 g | INTRAVENOUS | Status: AC
  Administered 2019-11-26: 12.5 g via INTRAVENOUS

## 2019-11-26 MED ORDER — VITAL HIGH PROTEIN PO LIQD
1000.0000 mL | ORAL | Status: DC
  Administered 2019-11-26: 1000 mL

## 2019-11-26 MED ORDER — INSULIN ASPART 100 UNIT/ML ~~LOC~~ SOLN: 0.0000 [IU] | SUBCUTANEOUS | Status: DC

## 2019-11-26 MED ORDER — LINEZOLID 600 MG PO TABS: 600.0000 mg | ORAL_TABLET | Freq: Two times a day (BID) | ORAL | Status: DC

## 2019-11-26 NOTE — Progress Notes (Addendum)
Daily Progress Note   Patient Name: Barbara Flowers       Date: 11/26/2019 DOB: 09/12/1951  Age: 69 y.o. MRN#: [id number] Attending Physician: Candee Furbish, MD Primary Care Physician: [name], NP Admit Date: 11/08/2019  Reason for Consultation/Follow-up: Terminal Care and Withdrawal of life-sustaining treatment  Subjective: Appears weak and chronically ill Now extubated  family at bedside.   Length of Stay: 15  Current Medications: Scheduled Meds:  . amiodarone  200 mg Oral BID  . chlorhexidine gluconate (MEDLINE KIT)  15 mL Mouth Rinse BID  . Chlorhexidine Gluconate Cloth  6 each Topical Daily  . collagenase   Topical Daily  . insulin aspart  0-6 Units Subcutaneous Q4H  . mouth rinse  15 mL Mouth Rinse 10 times per day  . mupirocin ointment   Nasal BID  . pantoprazole sodium  40 mg Per Tube Daily  . sodium chloride flush  10-40 mL Intracatheter Q12H    Continuous Infusions:   PRN Meds: acetaminophen (TYLENOL) oral liquid 160 mg/5 mL, antiseptic oral rinse, glycopyrrolate **OR** glycopyrrolate **OR** glycopyrrolate, haloperidol **OR** haloperidol **OR** haloperidol lactate, hydrALAZINE, HYDROmorphone (DILAUDID) injection, lip balm, LORazepam **OR** LORazepam **OR** [DISCONTINUED] LORazepam, LORazepam, ondansetron **OR** ondansetron (ZOFRAN) IV, polyvinyl alcohol, sodium chloride flush  Physical Exam         Frail appearing lady No distress Has anasarca Has muscle wasting Monitor noted Wounds noted  Vital Signs: BP (!) 136/46 (BP Location: Right Leg)   Pulse 63   Temp 97.6 F (36.4 C) (Oral)   Resp 19   Ht 5' 8"  (1.727 m)   Wt 74.4 kg   SpO2 100%   BMI 24.94 kg/m  SpO2: SpO2: 100 % O2 Device: O2 Device: Nasal Cannula O2 Flow Rate: O2 Flow Rate (L/min): 1  L/min  Intake/output summary:   Intake/Output Summary (Last 24 hours) at 11/26/2019 1055 Last data filed at 11/26/2019 1004 Gross per 24 hour  Intake 1052.82 ml  Output 4100 ml  Net -3047.18 ml   LBM: Last BM Date: 11/26/19 Baseline Weight: Weight: 57.5 kg Most recent weight: Weight: 74.4 kg       Palliative Assessment/Data:    Flowsheet Rows     Most Recent Value  Intake Tab  Referral Department  Critical care  Unit at Time of Referral  ICU  Palliative Care Primary Diagnosis  Sepsis/Infectious Disease  Date Notified  11/12/19  Palliative Care Type  Return patient Palliative Care  Reason for referral  Clarify Goals of Care  Date of Admission  10/27/2019  Date first seen by Palliative Care  11/12/19  # of days Palliative referral response time  0 Day(s)  # of days IP prior to Palliative referral  1  Clinical Assessment  Psychosocial & Spiritual Assessment  Palliative Care Outcomes      Patient Active Problem List   Diagnosis Date Noted  . Acute respiratory failure with hypoxemia ([place])   . Shortness of breath   . Dying care   . Acute respiratory distress   . Enterococcus faecalis infection 11/16/2019  . Septic shock ([place]) 11/02/2019  . Left spastic hemiparesis with h/o strokes 10/19/2019  . Malnutrition of moderate degree 10/19/2019  . Decreased functional mobility 10/17/2019  . Sacral decubitus ulcer, stage IV ([place]) 10/17/2019  . 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 Dysphagia 10/17/2019  . Kyphoscoliosis 10/17/2019  . H/O bone marrow transplant ([place])   . Palliative care by specialist   . Goals of care, counseling/discussion   . Generalized weakness   . Failure to thrive in adult   . Acute respiratory failure ([place])   . Pleural effusion   . Acute systolic heart failure ([place])   . Protein-calorie malnutrition, severe ([place]) 09/24/2019  . Pressure injury of skin 09/17/2019  . CAP (community acquired pneumonia) 09/16/2019  . Pleural effusion on left  09/16/2019  . Acute prerenal azotemia 09/16/2019  . Hypernatremia 09/16/2019  . Altered mental status, unspecified 07/12/2017  . Diarrhea 05/24/2017  . Avascular necrosis of bone of hip, right ([place]) 11/23/2016  . Memory difficulty 11/23/2016  . Hallucination, visual 11/23/2016  . Cervical myelopathy ([place]) 06/13/2016  . Hypokalemia 06/12/2016  . 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 Parkinson's disease ([place]) 06/11/2016  . GI bleed 06/09/2016  . Chronic right hip pain 01/10/2016  . Physical debility 01/10/2016  . Wheelchair dependent 2017- 10/17/2015  . TIA (transient ischemic attack) 08/31/2015  . History of CVA with residual deficit left side 08/31/2015  . Hypothyroidism 08/31/2015  . OSA on CPAP 08/31/2015  . Dementia ([place]) 08/31/2015  . Chronic orthostatic hypotension 08/31/2015  . OAB (overactive bladder) 08/31/2015  . 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  . Peripheral neuropathy 08/31/2015  . Avascular necrosis of right femoral head ([place]) 08/31/2015  . Left carotid bruit 08/31/2015  . Anemia 08/31/2015    Palliative Care Assessment & Plan   Patient Profile:    Assessment:  End stage dementia, deconditioning, autonomic dysfunction, heart failure, protein calorie malnutrition.  MRSA bronchitis vs. PNA.  Approaching end of life now vent dependent mostly due to weakness/apnea PPS 20% Failure to thrive Recurrent infections from multiple sources such as wounds, pneumonia, urinary infection Now extubated  Recommendations/Plan:   family meeting: I met with the patient's son outside the room, subsequently I also interacted face to face with the patient's husband, son, daughter and several of her other close family members in the consultation room outside the intensive care unit  Palliative medicine is specialized medical care for people living with serious illness. It focuses on providing relief from the symptoms and stress of a serious illness. The goal is to improve  quality of life for both the patient and the family.  Goals of care: Broad aims of medical therapy in relation to the patient's values and preferences. Our aim is to provide medical care  aimed at enabling patients to achieve the goals that matter most to them, given the circumstances of their particular medical situation and their constraints.   We discussed about goals of care.  We discussed about the patient's current condition, recent hospitalization.  We discussed about patient's ongoing risk of infection from multiple sources.  We discussed about her frailty, deconditioning and ongoing decline in spite of best measures.    Comfort measures only approach was discussed in detail.  We discussed that this would entail discontinuation of tube feeding discontinuation of antibiotics and establishing comfort as a singular focus and goal.  This would include any and all medications to be utilized and maximized for relief of suffering to allow for a natural death.  Hospice philosophy of care discussed.  Discussed about residential hospice.  Patient's family does not want hospice, does not want the patient to be transferred.    Patient's family is in agreement to initiate and continue comfort measures here in the hospital until the patient passes.  Anticipate hospital death.  Prognosis could be a few days at this point in my opinion.  Discussed with PCCM MD, discussed with bedside RN.  All of the patient's family's questions addressed to the best of my ability.  Offered compassionate presence active listening and supportive care.  Acknowledged that the family has been the patient's best advocate at all times.  Discussed frankly but compassionately that the patient is up against a lot of things and has several irreversible and incurable conditions at this point.  Hence, any chance of meaningful recovery appears minimal to nil.  PMT to follow.   Goals of Care and Additional Recommendations:  Limitations on  Scope of Treatment: Full Comfort Care  Code Status:    Code Status Orders  (From admission, onward)         Start     Ordered   11/25/19 1109  Do not attempt resuscitation (DNR)  Continuous    Question Answer Comment  In the event of cardiac or respiratory ARREST Do not call a "code blue"   In the event of cardiac or respiratory ARREST Do not perform Intubation, CPR, defibrillation or ACLS   In the event of cardiac or respiratory ARREST Use medication by any route, position, wound care, and other measures to relive pain and suffering. May use oxygen, suction and manual treatment of airway obstruction as needed for comfort.      11/25/19 1109        Code Status History    Date Active Date Inactive Code Status Order ID Comments User Context   11/23/2019 0749 11/25/2019 1109 Partial Code [id number]  Candee Furbish, MD Inpatient   11/19/2019 1526 11/23/2019 0749 Partial Code [id number]  Micheline Rough, MD Inpatient   10/28/2019 1800 11/19/2019 1526 Full Code [id number]  Marshell Garfinkel, MD ED   09/16/2019 1740 10/21/2019 2017 Full Code [id number]  Tanda Rockers, MD ED   05/24/2017 2004 05/28/2017 2035 Full Code [id number]  Etta Quill, DO ED   08/31/2015 1641 09/02/2015 2016 Full Code [id number]  Samella Parr, NP Inpatient   Advance Care Planning Activity    Advance Directive Documentation     Most Recent Value  Type of Advance Directive  Healthcare Power of Attorney, Living will  Pre-existing out of facility DNR order (yellow form or pink MOST form)  --  "MOST" Form in Place?  --       Prognosis:   Hours - Days  Discharge Planning:  Anticipated Hospital Death  Care plan was discussed with  Patient's husband, son, daughter and other family members who are at the bedside.   Thank you for allowing the Palliative Medicine Team to assist in the care of this patient.   Time In: 9.55 Time Out: 11 Total Time  65 Prolonged Time Billed Yes       Greater than 50%  of this time was  spent counseling and coordinating care related to the above assessment and plan.  Loistine Chance, MD  Please contact Palliative Medicine Team phone at [phone] for questions and concerns.

## 2019-11-26 NOTE — Progress Notes (Addendum)
CRITICAL VALUE ALERT  Critical Value:  CBG 68  Date & Time Notied:  3/7 @0410a   Provider Notified: n/a  Orders Received/Actions taken: Hypoglycemia protocol initiated - D50 12.5g given - recheck BS in 43min - CBG  99

## 2019-11-26 NOTE — Progress Notes (Signed)
PHARMACIST - PHYSICIAN COMMUNICATION CONCERNING: Antibiotic IV to Oral Route Change Policy  RECOMMENDATION: This patient is receiving zyvox by the intravenous route.  Based on criteria approved by the Pharmacy and Therapeutics Committee, the antibiotic(s) is/are being converted to the equivalent oral dose form(s).   DESCRIPTION: These criteria include: Patient being treated for a respiratory tract infection, urinary tract infection, cellulitis or clostridium difficile associated diarrhea if on metronidazole The patient is not neutropenic and does not exhibit a GI malabsorption state The patient is eating (either orally or via tube) and/or has been taking other orally administered medications for a least 24 hours The patient is improving clinically and has a Tmax < 100.5  If you have questions about this conversion, please contact the Pharmacy Department  []  ( [phone] )  [place] []  ( [phone] )  [facility] []  ( [phone]  [place] []  ( [phone] )  [facility] [x]  ( [phone] )  [facility]   

## 2019-11-26 NOTE — Progress Notes (Signed)
Pt resting quietly. Family at bedside.

## 2019-11-26 NOTE — Progress Notes (Signed)
NAME:  Barbara Flowers, MRN:  [id number], DOB:  [birth year], LOS: 25 ADMISSION DATE:  11/12/2019, CONSULTATION DATE:  11/04/2019 REFERRING MD: Gardenia Phlegm MD, CHIEF COMPLAINT: Septic shock  Brief History   68 year old with complicated history as noted below with recent admission from 12/26 to 10/21/2019 for respiratory failure, MRSA pneumonia, CHF.  Returns to hospital with low urine output, altered mental status. She was started on levaquin as an outpatient for UTI but continued to worsen. In ED she has septic shock from indwelling blocked Foley (which was replaced), urosepsis with AKI, hyperkalemia.   Past Medical History  Breast cancer status postmastectomy, brain aneurysm, dementia, CVA with left hemiplegia, cardiac contractures, stage IV sacral decub, Shy-Drager syndrome, LV thrombus, paroxysmal atrial fibrillation/flutter   has a past surgical history that includes Lymph node biopsy; Hand surgery (Left, 2015); Gastric bypass (1985); Cholecystectomy; Craniotomy for hemispherectomy total / partial (Right, 2010); Breast lumpectomy (Left, 1991); Hand tendon surgery (Left); Bone marrow transplant (1991); Laparoscopic gastrostomy (N/A, 10/18/2019); Laparoscopic lysis of adhesions (N/A, 10/18/2019); and Incisional hernia repair (N/A, 10/18/2019).  Significant Hospital Events   2/20- Admit amount peripheral levo. Urine culture enterococcus. Renal US - no hyror 2/21 - rapid A. fib-given amnio bolus 2.22 - Rt midline cath  2/23 - Off pressors.  Some moaning in pain per RN.  Scr slightly improved. -0> CCM signed off with recommendation for hospice 2/25 -rushed emergently to ICU from floor. Comatose and resp distress and hypoxemic. CXR with L sided collapse. Elink called CRNA and patient intubated without any RSI medications. Post  Intubation CXR shows resolution of collapse. BP ok. CCM recalled.  2/27 am freq PAF   > added beta blocker> bradycardic pm 2/27 so d/c'd  3/2 rapid afib, amio restarted 3/7  extubated  Consults:  PCCM  Palliative  Procedures:    Significant Diagnostic Tests:  Renal ultrasound 2/20-no hydronephrosis  Micro Data:  Blood cultures 2/20- Urine Cultures 2/20- enterococcus  MRSA PCR 2/20 > Positive  Trach 2/26 > abundant polys few gpcs no org seen >>>  MRSA   Antimicrobials:  Flagyl 2/21 >> 2/28 Cefepime 2/21 >> 2/28  Vanco 2/21 >3/1 Zyvox 3/2>   Interim history/subjective:  Low blood sugar this AM. Denies pain. Not hungry. Appears comfortable.  Objective   Blood pressure (!) 132/45, pulse 64, temperature 97.7 F (36.5 C), temperature source Axillary, resp. rate 16, height 5\' 8"  (1.727 m), weight 74.4 kg, SpO2 100 %.    Vent Mode: PRVC FiO2 (%):  [30 %] 30 % Set Rate:  [20 bmp] 20 bmp Vt Set:  [510 mL] 510 mL PEEP:  [5 cmH20] 5 cmH20 Plateau Pressure:  [14 cmH20] 14 cmH20   Intake/Output Summary (Last 24 hours) at 11/26/2019 [id number] Last data filed at 11/26/2019 0645 Gross per 24 hour  Intake 1441.9 ml  Output 3600 ml  Net -2158.1 ml   Filed Weights   11/24/19 0500 11/25/19 0402 11/26/19 0500  Weight: 72 kg 79 kg 74.4 kg   Physical exam  GEN: fraily elderly woman laying in bed HEENT: MM dry, trachea midline CV: RRR, ext warm PULM: Diminished bases, weak inspiratory effort GI: Soft, +BS, PEG in place EXT: global anasarca NEURO: globally weak, moves all 4 ext to command PSYCH: RASS 0 SKIN: Prominent chest wall veins, multiple pressure injuries, muscle wasting  Pressure Injury 11/08/2019 Sacrum Medial;Left Stage 4 - Full thickness tissue loss with exposed bone, tendon or muscle. wound w eschar (Active)  11/04/2019 2130  Location: Sacrum  Location  Orientation: Medial;Left  Staging: Stage 4 - Full thickness tissue loss with exposed bone, tendon or muscle.  Wound Description (Comments): wound w eschar  Present on Admission: Yes     Pressure Injury 10/28/2019 Foot Left;Lateral Unstageable - Full thickness tissue loss in which the base of the  injury is covered by slough (yellow, tan, gray, green or brown) and/or eschar (tan, brown or black) in the wound bed. (Active)  11/18/2019 2130  Location: Foot  Location Orientation: Left;Lateral  Staging: Unstageable - Full thickness tissue loss in which the base of the injury is covered by slough (yellow, tan, gray, green or brown) and/or eschar (tan, brown or black) in the wound bed.  Wound Description (Comments):   Present on Admission: Yes     Pressure Injury Ischial tuberosity Left (Active)     Location: Ischial tuberosity  Location Orientation: Left  Staging:   Wound Description (Comments):   Present on Admission:     Resolved Hospital Problem list   Shock  Assessment & Plan:  End stage dementia, deconditioning, autonomic dysfunction, heart failure, protein calorie malnutrition, hypoglycemia.  MRSA bronchitis vs. PNA.  Approaching end of life. -  Family still wants full scope of care short of CPR, we are not at comfort care I guess I was mistaken.  I am not sure what endpoint is here. 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

## 2019-11-27 NOTE — Care Management Important Message (Signed)
Important Message  Patient Details IM Letter given to Nancy Marus RN to present to the Patient Name: Barbara Flowers MRN: [id number] Date of Birth: Sep 07, 1951   Medicare Important Message Given:  Yes     Kerin Salen 11/27/2019, 10:23 AM

## 2019-11-27 NOTE — Progress Notes (Signed)
Daily Progress Note   Patient Name: Barbara Flowers       Date: 11/27/2019 DOB: Feb 17, 1951  Age: 69 y.o. MRN#: [id number] Attending Physician: [name], MD Primary Care Physician: [name], NP Admit Date: 10/27/2019  Reason for Consultation/Follow-up: Terminal Care and Withdrawal of life-sustaining treatment Comfort care, symptom management at end of life.   Subjective: Appears weak and chronically ill Husband at bedside.  Eyes open, doesn't verbalize.   Length of Stay: 16  Current Medications: Scheduled Meds:  . mupirocin ointment   Nasal BID  . pantoprazole sodium  40 mg Per Tube Daily  . sodium chloride flush  10-40 mL Intracatheter Q12H    Continuous Infusions:   PRN Meds: acetaminophen (TYLENOL) oral liquid 160 mg/5 mL, antiseptic oral rinse, glycopyrrolate **OR** glycopyrrolate **OR** glycopyrrolate, haloperidol **OR** haloperidol **OR** haloperidol lactate, hydrALAZINE, HYDROmorphone (DILAUDID) injection, lip balm, LORazepam **OR** LORazepam **OR** [DISCONTINUED] LORazepam, LORazepam, ondansetron **OR** ondansetron (ZOFRAN) IV, polyvinyl alcohol, sodium chloride flush  Physical Exam         Frail appearing lady No distress, eyes open, gazes meaningfully I think. Does not verbalize.  Shallow breathing.  Has anasarca, has weeping from UE and LE due to 3rd spacing.  Has muscle wasting Wounds noted  Vital Signs: BP 96/60 (BP Location: Right Leg)   Pulse 73   Temp 97.8 F (36.6 C) (Oral)   Resp 20   Ht 5\' 8"  (1.727 m)   Wt 77.5 kg   SpO2 95%   BMI 25.98 kg/m  SpO2: SpO2: 95 % O2 Device: O2 Device: Nasal Cannula O2 Flow Rate: O2 Flow Rate (L/min): 1 L/min  Intake/output summary:   Intake/Output Summary (Last 24 hours) at 11/27/2019 1250 Last data filed  at 11/27/2019 1000 Gross per 24 hour  Intake 0 ml  Output 800 ml  Net -800 ml   LBM: Last BM Date: 11/26/19 Baseline Weight: Weight: 57.5 kg Most recent weight: Weight: 77.5 kg       Palliative Assessment/Data:    Flowsheet Rows     Most Recent Value  Intake Tab  Referral Department  Critical care  Unit at Time of Referral  ICU  Palliative Care Primary Diagnosis  Sepsis/Infectious Disease  Date Notified  11/12/19  Palliative Care Type  Return patient Palliative Care  Reason for referral  Clarify Goals  of Care  Date of Admission  11/02/2019  Date first seen by Palliative Care  11/12/19  # of days Palliative referral response time  0 Day(s)  # of days IP prior to Palliative referral  1  Clinical Assessment  Psychosocial & Spiritual Assessment  Palliative Care Outcomes      Patient Active Problem List   Diagnosis Date Noted  . Acute respiratory failure with hypoxemia ([place])   . Shortness of breath   . Dying care   . Acute respiratory distress   . Enterococcus faecalis infection 11/16/2019  . Septic shock ([place]) 11/09/2019  . Left spastic hemiparesis with h/o strokes 10/19/2019  . Malnutrition of moderate degree 10/19/2019  . Decreased functional mobility 10/17/2019  . Sacral decubitus ulcer, stage IV ([place]) 10/17/2019  . 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 Dysphagia 10/17/2019  . Kyphoscoliosis 10/17/2019  . H/O bone marrow transplant ([place])   . Palliative care by specialist   . Goals of care, counseling/discussion   . Generalized weakness   . Failure to thrive in adult   . Acute respiratory failure ([place])   . Pleural effusion   . Acute systolic heart failure ([place])   . Protein-calorie malnutrition, severe ([place]) 09/24/2019  . Pressure injury of skin 09/17/2019  . CAP (community acquired pneumonia) 09/16/2019  . Pleural effusion on left 09/16/2019  . Acute prerenal azotemia 09/16/2019  . Hypernatremia 09/16/2019  . Altered mental status, unspecified  07/12/2017  . Diarrhea 05/24/2017  . Avascular necrosis of bone of hip, right ([place]) 11/23/2016  . Memory difficulty 11/23/2016  . Hallucination, visual 11/23/2016  . Cervical myelopathy ([place]) 06/13/2016  . Hypokalemia 06/12/2016  . 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 Parkinson's disease ([place]) 06/11/2016  . GI bleed 06/09/2016  . Chronic right hip pain 01/10/2016  . Physical debility 01/10/2016  . Wheelchair dependent 2017- 10/17/2015  . TIA (transient ischemic attack) 08/31/2015  . History of CVA with residual deficit left side 08/31/2015  . Hypothyroidism 08/31/2015  . OSA on CPAP 08/31/2015  . Dementia ([place]) 08/31/2015  . Chronic orthostatic hypotension 08/31/2015  . OAB (overactive bladder) 08/31/2015  . 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  . Peripheral neuropathy 08/31/2015  . Avascular necrosis of right femoral head ([place]) 08/31/2015  . Left carotid bruit 08/31/2015  . Anemia 08/31/2015    Palliative Care Assessment & Plan   Patient Profile:    Assessment:  End stage dementia, deconditioning, autonomic dysfunction, heart failure, protein calorie malnutrition.  MRSA bronchitis vs. PNA.  Approaching end of life now vent dependent mostly due to weakness/apnea PPS 20% Failure to thrive Recurrent infections from multiple sources such as wounds, pneumonia, urinary infection Now extubated  Recommendations/Plan:  Continue current scope of comfort measures  End-of-life signs and symptoms reviewed again with patient's husband who is present at the bedside he states that he has contacted a funeral home.  Patient's family wishes for patient to continue with comfort care here in the hospital.  Prognosis 2 weeks or less in my opinion      PMT to follow.   Goals of Care and Additional Recommendations:  Limitations on Scope of Treatment: Full Comfort Care  Code Status:    Code Status Orders  (From admission, onward)         Start     Ordered   11/25/19 1109   Do not attempt resuscitation (DNR)  Continuous    Question Answer Comment  In the event of cardiac or respiratory ARREST Do not  call a "code blue"   In the event of cardiac or respiratory ARREST Do not perform Intubation, CPR, defibrillation or ACLS   In the event of cardiac or respiratory ARREST Use medication by any route, position, wound care, and other measures to relive pain and suffering. May use oxygen, suction and manual treatment of airway obstruction as needed for comfort.      11/25/19 1109        Code Status History    Date Active Date Inactive Code Status Order ID Comments User Context   11/23/2019 0749 11/25/2019 1109 Partial Code [id number]  Candee Furbish, MD Inpatient   11/19/2019 1526 11/23/2019 0749 Partial Code [id number]  Micheline Rough, MD Inpatient   11/07/2019 1800 11/19/2019 1526 Full Code [id number]  Marshell Garfinkel, MD ED   09/16/2019 1740 10/21/2019 2017 Full Code [id number]  Tanda Rockers, MD ED   05/24/2017 2004 05/28/2017 2035 Full Code [id number]  Etta Quill, DO ED   08/31/2015 1641 09/02/2015 2016 Full Code [id number]  Samella Parr, NP Inpatient   Advance Care Planning Activity    Advance Directive Documentation     Most Recent Value  Type of Advance Directive  Healthcare Power of Attorney, Living will  Pre-existing out of facility DNR order (yellow form or pink MOST form)  --  "MOST" Form in Place?  --       Prognosis:   days.  Discharge Planning:  Anticipated Hospital Death  Care plan was discussed with  Patient's husband   Thank you for allowing the Palliative Medicine Team to assist in the care of this patient.   Time In: 12 Time Out: 12.35 Total Time 35 Prolonged Time Billed No       Greater than 50%  of this time was spent counseling and coordinating care related to the above assessment and plan.  Loistine Chance, MD  Please contact Palliative Medicine Team phone at [phone] for questions and concerns.

## 2019-11-27 NOTE — Progress Notes (Signed)
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 fib, dysphagia and PEG tube dependent with multiple recent hospitalization was admitted to the hospital with low urine output and altered mental status in the setting of underlying dementia and indwelling Foley catheter from home.  She was admitted to the intensive care unit and treated as septic shock, was somehow stabilized, again developed apnea needed intubation mechanical ventilation.  Treated with various measures with no meaningful recovery.  2/20-admitted with septic shock on Levophed.  Urine culture with Enterococcus.  Catheter exchanged.  Renal ultrasound was normal.   2/21 - rapid A. fib-given amnio bolus 2/22 - Rt midline cath  2/23 - Off pressors.  Some moaning in pain per RN.  Scr slightly improved. -0> CCM signed off with recommendation for hospice. 2/25 -rushed emergently to ICU from floor. Comatose and resp distress and hypoxemic. CXR with L sided collapse. Elink called CRNA and patient intubated without any RSI medications. Post  Intubation CXR shows resolution of collapse. BP ok. CCM recalled.  2/27 am freq PAF   > added beta blocker> bradycardic pm 2/27 so d/c'd  3/2 rapid afib, amio restarted 3/7 extubated and hospice care initiated.   Assessment & Plan:   Active Problems:   History of CVA with residual deficit left side   Hypothyroidism   Dementia (HCC)   Chronic orthostatic hypotension   Pressure injury of skin   Acute respiratory failure (HCC)   Failure to thrive in adult   Generalized weakness   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HCC)   Dysphagia    Malnutrition of moderate degree   Altered mental status, unspecified   Septic shock (HCC)   Enterococcus faecalis infection   Acute respiratory distress   Acute respiratory failure with hypoxemia (HCC)   Shortness of breath   Dying care  Septic shock due to UTI due to presence of indwelling Foley catheter: Resolved History of a stroke with left hemiplegia, dysphagia status post PEG tube, contractures. Failure to thrive, severe protein calorie malnutrition Vascular dementia  Plan: With poor recovery and terminal stage, family agreed for hospice level of care. Comfort care measures. All symptom control medications available. No escalation of care, no IV fluids, no blood draws. Family not agreeable to inpatient hospice transfer. Followed by palliative care in the hospital. She may die in the hospital. RN to pronounce death if happens inpatient.   DVT prophylaxis: Comfort care Code Status: DNR Family Communication: None.  Will communicate Disposition Plan: patient is from home. Anticipated DC to hospice, Barriers to discharge patient end-of-life care provided in the hospital.   Consultants:   PCCM  Palliative medicine  Procedures:   None  Antimicrobials:   Finished and discontinued   Subjective: I examined patient in the early morning rounds.  No overnight events.  Patient was comfortably sleepy.  Objective: Vitals:   11/26/19 0600 11/26/19 0800 11/26/19 1000 11/27/19 0529  BP: (!) 132/45 (!) 136/46 (!) 135/46 96/60  Pulse: 64 63 63 73  Resp: 16 19 17 20   Temp:  97.6 F (36.4 C)  97.8 F (36.6 C)  TempSrc:  Oral  Oral  SpO2: 100% 100% 100% 95%  Weight:    77.5 kg  Height:        Intake/Output Summary (Last 24 hours) at 11/27/2019 1315 Last data filed at 11/27/2019 1000 Gross per 24 hour  Intake 0 ml  Output 800 ml  Net -800 ml   Filed Weights   11/25/19 0402 11/26/19 0500 11/27/19 0529  Weight: 79 kg 74.4 kg 77.5 kg    Examination:  Physical Exam    Constitutional:  Patient was medicated and comfortably sleepy, she was not in any distress.  She opened her eyes on stimulation. Chronically sick looking and debilitated.  HENT:  Head: Atraumatic.  Respiratory:  Mostly upper airway conducted sounds.  GI:  PEG tube in place.   Muscle wasting.  Anasarca.  Contractures both lower extremities. Stage IV multiple ulcers on the buttock.   Data Reviewed: I have personally reviewed following labs and imaging studies  CBC: Recent Labs  Lab 11/22/19 0241 11/22/19 0648  WBC 10.4 12.9*  NEUTROABS  --  10.2*  HGB 5.1* 6.7*  HCT 17.1* 22.4*  MCV 93.4 94.9  PLT 94* 0000000   Basic Metabolic Panel: Recent Labs  Lab 11/21/19 0504 11/22/19 0241  NA 143 140  K 3.7 4.3  CL 114* 115*  CO2 21* 22  GLUCOSE 89 93  BUN 69* 74*  CREATININE 0.59 0.49  CALCIUM 7.6* 6.6*  MG 1.6* 1.6*  PHOS 2.2* 2.4*   GFR: Estimated Creatinine Clearance: 72.6 mL/min (by C-G formula based on SCr of 0.49 mg/dL). Liver Function Tests: No results for input(s): AST, ALT, ALKPHOS, BILITOT, PROT, ALBUMIN in the last 168 hours. No results for input(s): LIPASE, AMYLASE in the last 168 hours. No results for input(s): AMMONIA in the last 168 hours. Coagulation Profile: No results for input(s): INR, PROTIME in the last 168 hours. Cardiac Enzymes: No results for input(s): CKTOTAL, CKMB, CKMBINDEX, TROPONINI in the last 168 hours. BNP (last 3 results) No results for input(s): PROBNP in the last 8760 hours. HbA1C: No results for input(s): HGBA1C in the last 72 hours. CBG: Recent Labs  Lab 11/25/19 2033 11/26/19 0145 11/26/19 0410 11/26/19 0436 11/26/19 0818  GLUCAP 86 72 68* 99 74   Lipid Profile: No results for input(s): CHOL, HDL, LDLCALC, TRIG, CHOLHDL, LDLDIRECT in the last 72 hours. Thyroid Function Tests: No results for input(s): TSH, T4TOTAL, FREET4, T3FREE, THYROIDAB in the last 72 hours. Anemia Panel: No results for input(s): VITAMINB12, FOLATE,  FERRITIN, TIBC, IRON, RETICCTPCT in the last 72 hours. Sepsis Labs: No results for input(s): PROCALCITON, LATICACIDVEN in the last 168 hours.  Recent Results (from the past 240 hour(s))  Culture, respiratory     Status: None   Collection Time: 11/21/19 11:31 AM   Specimen: Tracheal Aspirate  Result Value Ref Range Status   Specimen Description   Final    TRACHEAL ASPIRATE Performed at [facility] [address] [place], [place] [address]    Special Requests   Final    NONE Performed at [facility], [place] [address] [place], [place] [address]    Gram Stain   Final    MODERATE WBC PRESENT, PREDOMINANTLY PMN RARE SQUAMOUS EPITHELIAL CELLS PRESENT FEW GRAM POSITIVE COCCI IN PAIRS Performed at [facility], [place] [address], [place], [place] [address]    Culture FEW METHICILLIN RESISTANT STAPHYLOCOCCUS AUREUS  Final   Report Status 11/23/2019 FINAL  Final   Organism ID, Bacteria METHICILLIN RESISTANT STAPHYLOCOCCUS AUREUS  Final      Susceptibility   Methicillin resistant staphylococcus aureus -  MIC*    CIPROFLOXACIN >=8 RESISTANT Resistant     ERYTHROMYCIN >=8 RESISTANT Resistant     GENTAMICIN <=0.5 SENSITIVE Sensitive     OXACILLIN >=4 RESISTANT Resistant     TETRACYCLINE 2 SENSITIVE Sensitive     VANCOMYCIN 1 SENSITIVE Sensitive     TRIMETH/SULFA <=10 SENSITIVE Sensitive     CLINDAMYCIN >=8 RESISTANT Resistant     RIFAMPIN <=0.5 SENSITIVE Sensitive     Inducible Clindamycin NEGATIVE Sensitive     * FEW METHICILLIN RESISTANT STAPHYLOCOCCUS AUREUS         Radiology Studies: No results found.      Scheduled Meds: . mupirocin ointment   Nasal BID  . pantoprazole sodium  40 mg Per Tube Daily  . sodium chloride flush  10-40 mL Intracatheter Q12H   Continuous Infusions:   LOS: 16 days    Time spent: 25 minutes    [name], MD [facility] Pager [phone]

## 2019-11-28 NOTE — Progress Notes (Signed)
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 fib, dysphagia and PEG tube dependent with multiple recent hospitalization was admitted to the hospital with low urine output and altered mental status in the setting of underlying dementia and indwelling Foley catheter from home.  She was admitted to the intensive care unit and treated as septic shock, was somehow stabilized, again developed apnea needed intubation mechanical ventilation.  Treated with various measures with no meaningful recovery.  2/20-admitted with septic shock on Levophed.  Urine culture with Enterococcus.  Catheter exchanged.  Renal ultrasound was normal.   2/21 - rapid A. fib-given amnio bolus 2/22 - Rt midline cath  2/23 - Off pressors.  Some moaning in pain per RN.  Scr slightly improved. -0> CCM signed off with recommendation for hospice. 2/25 -rushed emergently to ICU from floor. Comatose and resp distress and hypoxemic. CXR with L sided collapse. Elink called CRNA and patient intubated without any RSI medications. Post  Intubation CXR shows resolution of collapse. BP ok. CCM recalled.  2/27 am freq PAF   > added beta blocker> bradycardic pm 2/27 so d/c'd  3/2 rapid afib, amio restarted 3/7 extubated and hospice care initiated.   Assessment & Plan:   Active Problems:   History of CVA with residual deficit left side   Hypothyroidism   Dementia (HCC)   Chronic orthostatic hypotension   Pressure injury of skin   Acute respiratory failure (HCC)   Failure to thrive in adult   Generalized weakness   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HCC)   Dysphagia    Malnutrition of moderate degree   Altered mental status, unspecified   Septic shock (HCC)   Enterococcus faecalis infection   Acute respiratory distress   Acute respiratory failure with hypoxemia (HCC)   Shortness of breath   Dying care  Septic shock due to UTI with presence of indwelling Foley catheter: Resolved History of stroke with left hemiplegia, dysphagia status post PEG tube, contractures. Failure to thrive, severe protein calorie malnutrition Vascular dementia  Plan: With poor recovery and terminal stage, family agreed for hospice level of care. Comfort care measures. All symptom control medications available. No escalation of care, no IV fluids, no blood draws. Family not agreeable to inpatient hospice transfer. Followed by palliative care in the hospital. She may die in the hospital. RN to pronounce death if happens inpatient.   DVT prophylaxis: Comfort care Code Status: DNR Family Communication:  Disposition Plan: patient is from home. Anticipated DC to hospice, Barriers to discharge patient end-of-life care provided in the hospital.   Consultants:   PCCM  Palliative medicine  Procedures:   None  Antimicrobials:   None    Subjective: I examined patient in the early morning rounds.  No overnight events.  She was medicated and comfortable.   Objective: Vitals:   11/27/19 0529 11/27/19 1425 11/28/19 0500 11/28/19 0604  BP: 96/60 (!) 115/58  (!) 174/70  Pulse: 73 96  80  Resp: 20 20  15   Temp: 97.8 F (36.6 C) 97.8 F (36.6 C)  97.7 F (36.5 C)  TempSrc: Oral Oral  Axillary  SpO2: 95% (!) 83%  96%  Weight: 77.5 kg  74.7 kg  Height:        Intake/Output Summary (Last 24 hours) at 11/28/2019 1247 Last data filed at 11/28/2019 0534 Gross per 24 hour  Intake 275 ml  Output 1900 ml  Net -1625 ml   Filed Weights   11/26/19 0500 11/27/19 0529 11/28/19 0500  Weight: 74.4 kg 77.5 kg 74.7 kg    Examination:  Physical Exam  Constitutional:    Patient was medicated and comfortably sleepy, she was not in any distress.  She opened her eyes on stimulation. Chronically sick looking and debilitated.  HENT:  Head: Atraumatic.  Respiratory:  Mostly upper airway conducted sounds.  GI:  PEG tube in place.   Muscle wasting.  Anasarca.  Contractures both lower extremities. Stage IV multiple ulcers on the buttock.   Data Reviewed: I have personally reviewed following labs and imaging studies  CBC: Recent Labs  Lab 11/22/19 0241 11/22/19 0648  WBC 10.4 12.9*  NEUTROABS  --  10.2*  HGB 5.1* 6.7*  HCT 17.1* 22.4*  MCV 93.4 94.9  PLT 94* 0000000   Basic Metabolic Panel: Recent Labs  Lab 11/22/19 0241  NA 140  K 4.3  CL 115*  CO2 22  GLUCOSE 93  BUN 74*  CREATININE 0.49  CALCIUM 6.6*  MG 1.6*  PHOS 2.4*   GFR: Estimated Creatinine Clearance: 67 mL/min (by C-G formula based on SCr of 0.49 mg/dL). Liver Function Tests: No results for input(s): AST, ALT, ALKPHOS, BILITOT, PROT, ALBUMIN in the last 168 hours. No results for input(s): LIPASE, AMYLASE in the last 168 hours. No results for input(s): AMMONIA in the last 168 hours. Coagulation Profile: No results for input(s): INR, PROTIME in the last 168 hours. Cardiac Enzymes: No results for input(s): CKTOTAL, CKMB, CKMBINDEX, TROPONINI in the last 168 hours. BNP (last 3 results) No results for input(s): PROBNP in the last 8760 hours. HbA1C: No results for input(s): HGBA1C in the last 72 hours. CBG: Recent Labs  Lab 11/25/19 2033 11/26/19 0145 11/26/19 0410 11/26/19 0436 11/26/19 0818  GLUCAP 86 72 68* 99 74   Lipid Profile: No results for input(s): CHOL, HDL, LDLCALC, TRIG, CHOLHDL, LDLDIRECT in the last 72 hours. Thyroid Function Tests: No results for input(s): TSH, T4TOTAL, FREET4, T3FREE, THYROIDAB in the last 72 hours. Anemia Panel: No results for input(s): VITAMINB12, FOLATE, FERRITIN, TIBC, IRON, RETICCTPCT in the last 72 hours. Sepsis Labs: No results  for input(s): PROCALCITON, LATICACIDVEN in the last 168 hours.  Recent Results (from the past 240 hour(s))  Culture, respiratory     Status: None   Collection Time: 11/21/19 11:31 AM   Specimen: Tracheal Aspirate  Result Value Ref Range Status   Specimen Description   Final    TRACHEAL ASPIRATE Performed at [facility] [address], [place], [place] [address]    Special Requests   Final    NONE Performed at [facility], [place] [address], [place], [place] [address]    Gram Stain   Final    MODERATE WBC PRESENT, PREDOMINANTLY PMN RARE SQUAMOUS EPITHELIAL CELLS PRESENT FEW GRAM POSITIVE COCCI IN PAIRS Performed at [facility], [place] [address] [place], [place] [address]    Culture FEW METHICILLIN RESISTANT STAPHYLOCOCCUS AUREUS  Final   Report Status 11/23/2019 FINAL  Final   Organism ID, Bacteria METHICILLIN RESISTANT STAPHYLOCOCCUS AUREUS  Final      Susceptibility   Methicillin resistant staphylococcus aureus - MIC*    CIPROFLOXACIN >=8 RESISTANT Resistant  ERYTHROMYCIN >=8 RESISTANT Resistant     GENTAMICIN <=0.5 SENSITIVE Sensitive     OXACILLIN >=4 RESISTANT Resistant     TETRACYCLINE 2 SENSITIVE Sensitive     VANCOMYCIN 1 SENSITIVE Sensitive     TRIMETH/SULFA <=10 SENSITIVE Sensitive     CLINDAMYCIN >=8 RESISTANT Resistant     RIFAMPIN <=0.5 SENSITIVE Sensitive     Inducible Clindamycin NEGATIVE Sensitive     * FEW METHICILLIN RESISTANT STAPHYLOCOCCUS AUREUS         Radiology Studies: No results found.      Scheduled Meds: . mupirocin ointment   Nasal BID  . pantoprazole sodium  40 mg Per Tube Daily  . sodium chloride flush  10-40 mL Intracatheter Q12H   Continuous Infusions:   LOS: 17 days    Time spent: 25 minutes    [name], MD [facility] Pager [phone]

## 2019-11-28 NOTE — Progress Notes (Signed)
Daily Progress Note   Patient Name: Barbara Flowers       Date: 11/28/2019 DOB: [birth year]  Age: 69 y.o. MRN#: [id number] Attending Physician: [name], MD Primary Care Physician: [name], NP Admit Date: 11/05/2019  Reason for Consultation/Follow-up: Terminal Care and Withdrawal of life-sustaining treatment Comfort care, symptom management at end of life.   Subjective: Chart reviewed from my last seeing Barbara Flowers a week ago.    I saw and examined Barbara Flowers.  She is actively dying.  No observed signs of distress during encounter.  Met with patient's husband, Barbara Flowers, at the bedside.  He reports no observed signs of distress either.  He has spoken with Barbara Flowers and Barbara Flowers about arrangements when she passes.   Eyes open, doesn't verbalize.   Length of Stay: 17  Current Medications: Scheduled Meds:  . mupirocin ointment   Nasal BID  . pantoprazole sodium  40 mg Per Tube Daily  . sodium chloride flush  10-40 mL Intracatheter Q12H    Continuous Infusions:   PRN Meds: acetaminophen (TYLENOL) oral liquid 160 mg/5 mL, antiseptic oral rinse, glycopyrrolate **OR** glycopyrrolate **OR** glycopyrrolate, haloperidol **OR** haloperidol **OR** haloperidol lactate, hydrALAZINE, HYDROmorphone (DILAUDID) injection, lip balm, LORazepam **OR** LORazepam **OR** [DISCONTINUED] LORazepam, LORazepam, ondansetron **OR** ondansetron (ZOFRAN) IV, polyvinyl alcohol, sodium chloride flush  Physical Exam         Eyes closed.  Does not awaken to verbal or tactile stimulation. Shallow breathing. Some upper respiratory secretions. Has anasarca, has weeping from UE and LE due to 3rd spacing.  Has muscle wasting Wounds noted  Vital Signs: BP (!) 174/70 (BP Location: Right Leg) Comment: RN notified   Pulse 80   Temp 97.7 F (36.5 C) (Axillary)   Resp 15   Ht 5' 8"  (1.727 m)   Wt 74.7 kg   SpO2 96%   BMI 25.04 kg/m  SpO2: SpO2: 96 % O2 Device: O2 Device: Room Air O2 Flow Rate: O2 Flow Rate (L/min): 1 L/min  Intake/output summary:   Intake/Output Summary (Last 24 hours) at 11/28/2019 1120 Last data filed at 11/28/2019 0534 Gross per 24 hour  Intake 275 ml  Output 1900 ml  Net -1625 ml   LBM: Last BM Date: 11/27/19 Baseline Weight: Weight: 57.5 kg Most recent weight: Weight: 74.7 kg       Palliative  Assessment/Data:    Flowsheet Rows     Most Recent Value  Intake Tab  Referral Department  Critical care  Unit at Time of Referral  ICU  Palliative Care Primary Diagnosis  Sepsis/Infectious Disease  Date Notified  11/12/19  Palliative Care Type  Return patient Palliative Care  Reason for referral  Clarify Goals of Care  Date of Admission  10/23/2019  Date first seen by Palliative Care  11/12/19  # of days Palliative referral response time  0 Day(s)  # of days IP prior to Palliative referral  1  Clinical Assessment  Psychosocial & Spiritual Assessment  Palliative Care Outcomes      Patient Active Problem List   Diagnosis Date Noted  . Acute respiratory failure with hypoxemia ([place])   . Shortness of breath   . Dying care   . Acute respiratory distress   . Enterococcus faecalis infection 11/16/2019  . Septic shock ([place]) 11/13/2019  . Left spastic hemiparesis with h/o strokes 10/19/2019  . Malnutrition of moderate degree 10/19/2019  . Decreased functional mobility 10/17/2019  . Sacral decubitus ulcer, stage IV ([place]) 10/17/2019  . 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 Dysphagia 10/17/2019  . Kyphoscoliosis 10/17/2019  . H/O bone marrow transplant ([place])   . Palliative care by specialist   . Goals of care, counseling/discussion   . Generalized weakness   . Failure to thrive in adult   . Acute respiratory failure ([place])   . Pleural effusion   . Acute  systolic heart failure ([place])   . Protein-calorie malnutrition, severe ([place]) 09/24/2019  . Pressure injury of skin 09/17/2019  . CAP (community acquired pneumonia) 09/16/2019  . Pleural effusion on left 09/16/2019  . Acute prerenal azotemia 09/16/2019  . Hypernatremia 09/16/2019  . Altered mental status, unspecified 07/12/2017  . Diarrhea 05/24/2017  . Avascular necrosis of bone of hip, right ([place]) 11/23/2016  . Memory difficulty 11/23/2016  . Hallucination, visual 11/23/2016  . Cervical myelopathy ([place]) 06/13/2016  . Hypokalemia 06/12/2016  . 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[place]) 06/12/2016  . Parkinson's disease ([place]) 06/11/2016  . GI bleed 06/09/2016  . Chronic right hip pain 01/10/2016  . Physical debility 01/10/2016  . Wheelchair dependent 2017- 10/17/2015  . TIA (transient ischemic attack) 08/31/2015  . History of CVA with residual deficit left side 08/31/2015  . Hypothyroidism 08/31/2015  . OSA on CPAP 08/31/2015  . Dementia ([place]) 08/31/2015  . Chronic orthostatic hypotension 08/31/2015  . OAB (overactive bladder) 08/31/2015  . 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  . Peripheral neuropathy 08/31/2015  . Avascular necrosis of right femoral head ([place]) 08/31/2015  . Left carotid bruit 08/31/2015  . Anemia 08/31/2015    Palliative Care Assessment & Plan    Assessment: End of life care PPS 10%  Recommendations/Plan:  Continue current scope of comfort measures.  Met with patient's husband who is present at the bedside. Reviewed plan for comfort.  He has my contact info and will call with any concerns.  Patient's family wishes for patient to continue with comfort care here in the hospital.  Prognosis likely hour to days at this point.      PMT to follow.   Goals of Care and Additional Recommendations:  Limitations on Scope of Treatment: Full Comfort Care  Code Status:    Code Status Orders  (From admission, onward)         Start     Ordered   11/25/19 1109  Do not  attempt resuscitation (DNR)  Continuous  Question Answer Comment  In the event of cardiac or respiratory ARREST Do not call a "code blue"   In the event of cardiac or respiratory ARREST Do not perform Intubation, CPR, defibrillation or ACLS   In the event of cardiac or respiratory ARREST Use medication by any route, position, wound care, and other measures to relive pain and suffering. May use oxygen, suction and manual treatment of airway obstruction as needed for comfort.      11/25/19 1109        Code Status History    Date Active Date Inactive Code Status Order ID Comments User Context   11/23/2019 0749 11/25/2019 1109 Partial Code [id number]  Candee Furbish, MD Inpatient   11/19/2019 1526 11/23/2019 0749 Partial Code [id number]  Micheline Rough, MD Inpatient   10/23/2019 1800 11/19/2019 1526 Full Code [id number]  Marshell Garfinkel, MD ED   09/16/2019 1740 10/21/2019 2017 Full Code [id number]  Tanda Rockers, MD ED   05/24/2017 2004 05/28/2017 2035 Full Code [id number]  Etta Quill, DO ED   08/31/2015 1641 09/02/2015 2016 Full Code [id number]  Samella Parr, NP Inpatient   Advance Care Planning Activity    Advance Directive Documentation     Most Recent Value  Type of Advance Directive  Healthcare Power of Attorney, Living will  Pre-existing out of facility DNR order (yellow form or pink MOST form)  --  "MOST" Form in Place?  --       Prognosis:   Hours to days  Discharge Planning:  Anticipated Hospital Death  Care plan was discussed with  Patient's husband   Thank you for allowing the Palliative Medicine Team to assist in the care of this patient.   Time In: 1050 Time Out: 1120 Total Time 30 Prolonged Time Billed No       Greater than 50%  of this time was spent counseling and coordinating care related to the above assessment and plan.  Micheline Rough, MD  Please contact Palliative Medicine Team phone at [phone] for questions and concerns.

## 2019-11-29 NOTE — Progress Notes (Signed)
Daily Progress Note   Patient Name: Barbara Flowers       Date: 11/29/2019 DOB: December 11, 1950  Age: 69 y.o. MRN#: [id number] Attending Physician: [name], MD Primary Care Physician: [name], NP Admit Date: 11/05/2019  Reason for Consultation/Follow-up: Terminal Care and Withdrawal of life-sustaining treatment Comfort care, symptom management at end of life.   Subjective:   I saw and examined Barbara Flowers.  She is actively dying.  No observed signs of distress during encounter.  RN reports restlessness this AM that resolved with dilaudid.  Met with patient's husband, [name], at the bedside.  He reports no new concerns today.  Anticipatory guidance provided on expectations moving forward.  Eyes open, doesn't verbalize.   Length of Stay: 18  Current Medications: Scheduled Meds:  . mupirocin ointment   Nasal BID  . pantoprazole sodium  40 mg Per Tube Daily  . sodium chloride flush  10-40 mL Intracatheter Q12H    Continuous Infusions:   PRN Meds: acetaminophen (TYLENOL) oral liquid 160 mg/5 mL, antiseptic oral rinse, glycopyrrolate **OR** glycopyrrolate **OR** glycopyrrolate, haloperidol **OR** haloperidol **OR** haloperidol lactate, hydrALAZINE, HYDROmorphone (DILAUDID) injection, lip balm, LORazepam **OR** LORazepam **OR** [DISCONTINUED] LORazepam, LORazepam, ondansetron **OR** ondansetron (ZOFRAN) IV, polyvinyl alcohol, sodium chloride flush  Physical Exam         Eyes closed.  Does not awaken to verbal or tactile stimulation. Shallow breathing. No secretions, but snoring noted. Has anasarca, has weeping from UE and LE due to 3rd spacing.  Has muscle wasting Wounds noted  Vital Signs: BP (!) 99/58 (BP Location: Left Arm)   Pulse 98   Temp 99.9 F (37.7 C) (Oral)   Resp  (!) 24   Ht 5' 8"  (1.727 m)   Wt 71.5 kg   SpO2 (!) 75%   BMI 23.97 kg/m  SpO2: SpO2: (!) 75 % O2 Device: O2 Device: Room Air O2 Flow Rate: O2 Flow Rate (L/min): 1 L/min  Intake/output summary:   Intake/Output Summary (Last 24 hours) at 11/29/2019 1123 Last data filed at 11/29/2019 8088 Gross per 24 hour  Intake 10 ml  Output 1000 ml  Net -990 ml   LBM: Last BM Date: 11/27/19 Baseline Weight: Weight: 57.5 kg Most recent weight: Weight: 71.5 kg       Palliative Assessment/Data:    Flowsheet Rows  Most Recent Value  Intake Tab  Referral Department  Critical care  Unit at Time of Referral  ICU  Palliative Care Primary Diagnosis  Sepsis/Infectious Disease  Date Notified  11/12/19  Palliative Care Type  Return patient Palliative Care  Reason for referral  Clarify Goals of Care  Date of Admission  11/10/2019  Date first seen by Palliative Care  11/12/19  # of days Palliative referral response time  0 Day(s)  # of days IP prior to Palliative referral  1  Clinical Assessment  Psychosocial & Spiritual Assessment  Palliative Care Outcomes      Patient Active Problem List   Diagnosis Date Noted  . Acute respiratory failure with hypoxemia ([place])   . Shortness of breath   . Dying care   . Acute respiratory distress   . Enterococcus faecalis infection 11/16/2019  . Septic shock ([place]) 10/23/2019  . Left spastic hemiparesis with h/o strokes 10/19/2019  . Malnutrition of moderate degree 10/19/2019  . Decreased functional mobility 10/17/2019  . Sacral decubitus ulcer, stage IV ([place]) 10/17/2019  . 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 Dysphagia 10/17/2019  . Kyphoscoliosis 10/17/2019  . H/O bone marrow transplant ([place])   . Palliative care by specialist   . Goals of care, counseling/discussion   . Generalized weakness   . Failure to thrive in adult   . Acute respiratory failure ([place])   . Pleural effusion   . Acute systolic heart failure ([place])   .  Protein-calorie malnutrition, severe ([place]) 09/24/2019  . Pressure injury of skin 09/17/2019  . CAP (community acquired pneumonia) 09/16/2019  . Pleural effusion on left 09/16/2019  . Acute prerenal azotemia 09/16/2019  . Hypernatremia 09/16/2019  . Altered mental status, unspecified 07/12/2017  . Diarrhea 05/24/2017  . Avascular necrosis of bone of hip, right ([place]) 11/23/2016  . Memory difficulty 11/23/2016  . Hallucination, visual 11/23/2016  . Cervical myelopathy ([place]) 06/13/2016  . Hypokalemia 06/12/2016  . 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 Parkinson's disease ([place]) 06/11/2016  . GI bleed 06/09/2016  . Chronic right hip pain 01/10/2016  . Physical debility 01/10/2016  . Wheelchair dependent 2017- 10/17/2015  . TIA (transient ischemic attack) 08/31/2015  . History of CVA with residual deficit left side 08/31/2015  . Hypothyroidism 08/31/2015  . OSA on CPAP 08/31/2015  . Dementia ([place]) 08/31/2015  . Chronic orthostatic hypotension 08/31/2015  . OAB (overactive bladder) 08/31/2015  . 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  . Peripheral neuropathy 08/31/2015  . Avascular necrosis of right femoral head ([place]) 08/31/2015  . Left carotid bruit 08/31/2015  . Anemia 08/31/2015    Palliative Care Assessment & Plan    Assessment: End of life care PPS 10%  Recommendations/Plan:  Continue current scope of comfort measures.  Met with patient's husband who is present at the bedside. Reviewed plan for comfort.  He has my contact info and will call with any concerns.  Patient's family wishes for patient to continue with comfort care here in the hospital.  Prognosis likely hour to days at this point.      PMT to follow.   Goals of Care and Additional Recommendations:  Limitations on Scope of Treatment: Full Comfort Care  Code Status:    Code Status Orders  (From admission, onward)         Start     Ordered   11/25/19 1109  Do not attempt resuscitation (DNR)   Continuous    Question Answer Comment  In the event of  cardiac or respiratory ARREST Do not call a "code blue"   In the event of cardiac or respiratory ARREST Do not perform Intubation, CPR, defibrillation or ACLS   In the event of cardiac or respiratory ARREST Use medication by any route, position, wound care, and other measures to relive pain and suffering. May use oxygen, suction and manual treatment of airway obstruction as needed for comfort.      11/25/19 1109        Code Status History    Date Active Date Inactive Code Status Order ID Comments User Context   11/23/2019 0749 11/25/2019 1109 Partial Code [id number]  Candee Furbish, MD Inpatient   11/19/2019 1526 11/23/2019 0749 Partial Code [id number]  Micheline Rough, MD Inpatient   11/19/2019 1800 11/19/2019 1526 Full Code [id number]  Marshell Garfinkel, MD ED   09/16/2019 1740 10/21/2019 2017 Full Code [id number]  Tanda Rockers, MD ED   05/24/2017 2004 05/28/2017 2035 Full Code [id number]  Etta Quill, DO ED   08/31/2015 1641 09/02/2015 2016 Full Code [id number]  Samella Parr, NP Inpatient   Advance Care Planning Activity    Advance Directive Documentation     Most Recent Value  Type of Advance Directive  Healthcare Power of Attorney, Living will  Pre-existing out of facility DNR order (yellow form or pink MOST form)  --  "MOST" Form in Place?  --       Prognosis:   Hours to days  Discharge Planning:  Anticipated Hospital Death  Care plan was discussed with  Patient's husband   Thank you for allowing the Palliative Medicine Team to assist in the care of this patient.   Time In: 1100 Time Out: 1120 Total Time 20 Prolonged Time Billed No       Greater than 50%  of this time was spent counseling and coordinating care related to the above assessment and plan.  Micheline Rough, MD  Please contact Palliative Medicine Team phone at [phone] for questions and concerns.

## 2019-11-29 NOTE — Consult Note (Signed)
WOC reviewed chart.  Will sign off, patient now full comfort care.    Re consult if needed, will not follow at this time. Thanks  Kerry Chisolm [employer], RN,CWOCN, CNS, [place] [phone])

## 2019-11-29 NOTE — Progress Notes (Signed)
Nutrition Follow-up  Nutrition Brief Note  Chart reviewed.  Pt has transitioning to comfort care.  No further nutrition interventions warranted. Nutrition will sign off at this time.   Please consult as needed.   Lajuan Lines, RD, LDN Clinical Nutrition After Hours/Weekend Pager # in [place]

## 2019-11-29 NOTE — Progress Notes (Signed)
PROGRESS NOTE    Barbara Flowers  [id number] DOB: 1951/05/10 DOA: 11/15/2019 PCP: [name], NP    Brief Narrative:  Patient is 69 year old female with complicated medical history including history of breast cancer status post mastectomy, brain aneurysm, dementia, CVA with left hemiplegia and lower extremity contractures, stage IV sacral decubitus ulcer, Shy-Drager syndrome, left ventricular thrombus, paroxysmal A. fib, dysphagia and PEG tube dependent with multiple recent hospitalization was admitted to the hospital with low urine output and altered mental status in the setting of underlying dementia and indwelling Foley catheter from home.  She was admitted to the intensive care unit and treated as septic shock, was somehow stabilized, again developed apnea needed intubation mechanical ventilation.  Treated with various measures with no meaningful recovery.  2/20-admitted with septic shock on Levophed.  Urine culture with Enterococcus.  Catheter exchanged.  Renal ultrasound was normal.   2/21 - rapid A. fib-given amnio bolus 2/22 - Rt midline cath  2/23 - Off pressors.  Some moaning in pain per RN.  Scr slightly improved. -0> CCM signed off with recommendation for hospice. 2/25 -rushed emergently to ICU from floor. Comatose and resp distress and hypoxemic. CXR with L sided collapse. Elink called CRNA and patient intubated without any RSI medications. Post  Intubation CXR shows resolution of collapse. BP ok. CCM recalled.  2/27 am freq PAF   > added beta blocker> bradycardic pm 2/27 so d/c'd  3/2 rapid afib, amio restarted 3/7 extubated and hospice care initiated.   Assessment & Plan:   Active Problems:   History of CVA with residual deficit left side   Hypothyroidism   Dementia (HCC)   Chronic orthostatic hypotension   Pressure injury of skin   Acute respiratory failure (HCC)   Failure to thrive in adult   Generalized weakness   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HCC)   Dysphagia    Malnutrition of moderate degree   Altered mental status, unspecified   Septic shock (HCC)   Enterococcus faecalis infection   Acute respiratory distress   Acute respiratory failure with hypoxemia (HCC)   Shortness of breath   Dying care  Septic shock due to UTI with presence of indwelling Foley catheter: Resolved History of stroke with left hemiplegia, dysphagia status post PEG tube, contractures. Failure to thrive, severe protein calorie malnutrition Vascular dementia  Plan: With comfort care and hospice.   She is actively dying today.   All symptom control medications available. RN to pronounce death if happens inpatient.   DVT prophylaxis: Comfort care Code Status: DNR Family Communication:  Disposition Plan: patient is from home. Anticipated DC to hospice, Barriers to discharge patient end-of-life care provided in the hospital.   Consultants:   PCCM  Palliative medicine  Procedures:   None  Antimicrobials:   None    Subjective: Patient seen and examined.  Patient had periods of agitation in the morning, medicated.  Objective: Vitals:   11/28/19 0604 11/28/19 1357 11/29/19 0500 11/29/19 0622  BP: (!) 174/70 (!) 130/54  (!) 99/58  Pulse: 80 74  98  Resp: 15 16  (!) 24  Temp: 97.7 F (36.5 C) 98.5 F (36.9 C)  99.9 F (37.7 C)  TempSrc: Axillary   Oral  SpO2: 96% 98%  (!) 75%  Weight:   71.5 kg   Height:        Intake/Output Summary (Last 24 hours) at 11/29/2019 1143 Last data filed at 11/29/2019 0921 Gross per 24 hour  Intake 10 ml  Output 1000 ml  Net -990 ml   Filed Weights   11/27/19 0529 11/28/19 0500 11/29/19 0500  Weight: 77.5 kg 74.7 kg 71.5 kg    Examination:  Physical Exam  Constitutional:  Patient is unresponsive with intermittent swallow breathing and irregular breathing patterns.  Medicated for comfort.  HENT:  Head: Atraumatic.  Respiratory:  Conducted airway sounds.  GI:  PEG tube in place.   Muscle wasting.  Anasarca.   Contractures both lower extremities. Stage IV multiple ulcers on the buttock.   Data Reviewed: I have personally reviewed following labs and imaging studies  CBC: No results for input(s): WBC, NEUTROABS, HGB, HCT, MCV, PLT in the last 168 hours. Basic Metabolic Panel: No results for input(s): NA, K, CL, CO2, GLUCOSE, BUN, CREATININE, CALCIUM, MG, PHOS in the last 168 hours. GFR: Estimated Creatinine Clearance: 67 mL/min (by C-G formula based on SCr of 0.49 mg/dL). Liver Function Tests: No results for input(s): AST, ALT, ALKPHOS, BILITOT, PROT, ALBUMIN in the last 168 hours. No results for input(s): LIPASE, AMYLASE in the last 168 hours. No results for input(s): AMMONIA in the last 168 hours. Coagulation Profile: No results for input(s): INR, PROTIME in the last 168 hours. Cardiac Enzymes: No results for input(s): CKTOTAL, CKMB, CKMBINDEX, TROPONINI in the last 168 hours. BNP (last 3 results) No results for input(s): PROBNP in the last 8760 hours. HbA1C: No results for input(s): HGBA1C in the last 72 hours. CBG: Recent Labs  Lab 11/25/19 2033 11/26/19 0145 11/26/19 0410 11/26/19 0436 11/26/19 0818  GLUCAP 86 72 68* 99 74   Lipid Profile: No results for input(s): CHOL, HDL, LDLCALC, TRIG, CHOLHDL, LDLDIRECT in the last 72 hours. Thyroid Function Tests: No results for input(s): TSH, T4TOTAL, FREET4, T3FREE, THYROIDAB in the last 72 hours. Anemia Panel: No results for input(s): VITAMINB12, FOLATE, FERRITIN, TIBC, IRON, RETICCTPCT in the last 72 hours. Sepsis Labs: No results for input(s): PROCALCITON, LATICACIDVEN in the last 168 hours.  Recent Results (from the past 240 hour(s))  Culture, respiratory     Status: None   Collection Time: 11/21/19 11:31 AM   Specimen: Tracheal Aspirate  Result Value Ref Range Status   Specimen Description   Final    TRACHEAL ASPIRATE Performed at [facility] [address], [place], [place] [address]    Special  Requests   Final    NONE Performed at [facility], [place] [address], [place], [place] [address]    Gram Stain   Final    MODERATE WBC PRESENT, PREDOMINANTLY PMN RARE SQUAMOUS EPITHELIAL CELLS PRESENT FEW GRAM POSITIVE COCCI IN PAIRS Performed at [facility], [place] [address], [place], [place] [address]    Culture FEW METHICILLIN RESISTANT STAPHYLOCOCCUS AUREUS  Final   Report Status 11/23/2019 FINAL  Final   Organism ID, Bacteria METHICILLIN RESISTANT STAPHYLOCOCCUS AUREUS  Final      Susceptibility   Methicillin resistant staphylococcus aureus - MIC*    CIPROFLOXACIN >=8 RESISTANT Resistant     ERYTHROMYCIN >=8 RESISTANT Resistant     GENTAMICIN <=0.5 SENSITIVE Sensitive     OXACILLIN >=4 RESISTANT Resistant     TETRACYCLINE 2 SENSITIVE Sensitive     VANCOMYCIN 1 SENSITIVE Sensitive     TRIMETH/SULFA <=10 SENSITIVE Sensitive     CLINDAMYCIN >=8 RESISTANT Resistant     RIFAMPIN <=0.5 SENSITIVE Sensitive     Inducible Clindamycin NEGATIVE Sensitive     * FEW METHICILLIN RESISTANT STAPHYLOCOCCUS AUREUS         Radiology  Studies: No results found.      Scheduled Meds: . mupirocin ointment   Nasal BID  . pantoprazole sodium  40 mg Per Tube Daily  . sodium chloride flush  10-40 mL Intracatheter Q12H   Continuous Infusions:   LOS: 18 days    Time spent: 25 minutes    [name], MD [facility] Pager [phone]

## 2019-11-30 NOTE — Plan of Care (Signed)
  Problem: Pain Managment: Goal: General experience of comfort will improve Outcome: Progressing   Problem: Skin Integrity: Goal: Risk for impaired skin integrity will decrease Outcome: Not Progressing

## 2019-11-30 NOTE — Progress Notes (Signed)
Daily Progress Note   Patient Name: Barbara Flowers       Date: 11/30/2019 DOB: 1951-05-16  Age: 69 y.o. MRN#: [id number] Attending Physician: [name], MD Primary Care Physician: [name], NP Admit Date: 11/02/2019  Reason for Consultation/Follow-up: Terminal Care and Withdrawal of life-sustaining treatment Comfort care, symptom management at end of life.   Subjective:   I saw and examined Ms. Pennick.  She is actively dying.  No observed signs of distress during encounter.  Appreciate nurse tech continued work to find most comfortable position for her as she changes position.  She appears comfortable at time of my visit.  Eyes open, doesn't verbalize.   Length of Stay: 19  Current Medications: Scheduled Meds:  . mupirocin ointment   Nasal BID  . pantoprazole sodium  40 mg Per Tube Daily  . sodium chloride flush  10-40 mL Intracatheter Q12H    Continuous Infusions:   PRN Meds: acetaminophen (TYLENOL) oral liquid 160 mg/5 mL, antiseptic oral rinse, glycopyrrolate **OR** glycopyrrolate **OR** glycopyrrolate, haloperidol **OR** haloperidol **OR** haloperidol lactate, hydrALAZINE, HYDROmorphone (DILAUDID) injection, lip balm, LORazepam **OR** LORazepam **OR** [DISCONTINUED] LORazepam, LORazepam, ondansetron **OR** ondansetron (ZOFRAN) IV, polyvinyl alcohol, sodium chloride flush  Physical Exam         Eyes closed.  Does not awaken to verbal or tactile stimulation. Shallow breathing. No secretions, but snoring noted. Has anasarca, has weeping from UE and LE due to 3rd spacing.  Has muscle wasting Wounds noted  Vital Signs: BP (!) 138/57 (BP Location: Right Leg)   Pulse 73   Temp 98.2 F (36.8 C) (Oral)   Resp 19   Ht 5\' 8"  (1.727 m)   Wt 71.3 kg   SpO2 (!) 89%    BMI 23.90 kg/m  SpO2: SpO2: (!) 89 % O2 Device: O2 Device: Room Air O2 Flow Rate: O2 Flow Rate (L/min): 1 L/min  Intake/output summary:   Intake/Output Summary (Last 24 hours) at 11/30/2019 1030 Last data filed at 11/30/2019 [id number] Gross per 24 hour  Intake --  Output 180 ml  Net -180 ml   LBM: Last BM Date: 11/27/19 Baseline Weight: Weight: 57.5 kg Most recent weight: Weight: 71.3 kg       Palliative Assessment/Data:    Flowsheet Rows     Most Recent Value  Intake Tab  Referral  Department  Critical care  Unit at Time of Referral  ICU  Palliative Care Primary Diagnosis  Sepsis/Infectious Disease  Date Notified  11/12/19  Palliative Care Type  Return patient Palliative Care  Reason for referral  Clarify Goals of Care  Date of Admission  11/14/2019  Date first seen by Palliative Care  11/12/19  # of days Palliative referral response time  0 Day(s)  # of days IP prior to Palliative referral  1  Clinical Assessment  Psychosocial & Spiritual Assessment  Palliative Care Outcomes      Patient Active Problem List   Diagnosis Date Noted  . Acute respiratory failure with hypoxemia ([place])   . Shortness of breath   . Dying care   . Acute respiratory distress   . Enterococcus faecalis infection 11/16/2019  . Septic shock ([place]) 11/18/2019  . Left spastic hemiparesis with h/o strokes 10/19/2019  . Malnutrition of moderate degree 10/19/2019  . Decreased functional mobility 10/17/2019  . Sacral decubitus ulcer, stage IV ([place]) 10/17/2019  . 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 Dysphagia 10/17/2019  . Kyphoscoliosis 10/17/2019  . H/O bone marrow transplant ([place])   . Palliative care by specialist   . Goals of care, counseling/discussion   . Generalized weakness   . Failure to thrive in adult   . Acute respiratory failure ([place])   . Pleural effusion   . Acute systolic heart failure ([place])   . Protein-calorie malnutrition, severe ([place]) 09/24/2019  . Pressure injury of  skin 09/17/2019  . CAP (community acquired pneumonia) 09/16/2019  . Pleural effusion on left 09/16/2019  . Acute prerenal azotemia 09/16/2019  . Hypernatremia 09/16/2019  . Altered mental status, unspecified 07/12/2017  . Diarrhea 05/24/2017  . Avascular necrosis of bone of hip, right ([place]) 11/23/2016  . Memory difficulty 11/23/2016  . Hallucination, visual 11/23/2016  . Cervical myelopathy ([place]) 06/13/2016  . Hypokalemia 06/12/2016  . 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 Parkinson's disease ([place]) 06/11/2016  . GI bleed 06/09/2016  . Chronic right hip pain 01/10/2016  . Physical debility 01/10/2016  . Wheelchair dependent 2017- 10/17/2015  . TIA (transient ischemic attack) 08/31/2015  . History of CVA with residual deficit left side 08/31/2015  . Hypothyroidism 08/31/2015  . OSA on CPAP 08/31/2015  . Dementia ([place]) 08/31/2015  . Chronic orthostatic hypotension 08/31/2015  . OAB (overactive bladder) 08/31/2015  . 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  . Peripheral neuropathy 08/31/2015  . Avascular necrosis of right femoral head ([place]) 08/31/2015  . Left carotid bruit 08/31/2015  . Anemia 08/31/2015    Palliative Care Assessment & Plan    Assessment: End of life care PPS 10%  Recommendations/Plan:  Continue current scope of comfort measures.  Husband not at bedside at time of my visit today.  He has my contact info and stated prior that he will call with any concerns.  Patient's family wishes for patient to continue with comfort care here in the hospital.  Prognosis likely hour to days at this point.      PMT to follow.   Goals of Care and Additional Recommendations:  Limitations on Scope of Treatment: Full Comfort Care  Code Status:    Code Status Orders  (From admission, onward)         Start     Ordered   11/25/19 1109  Do not attempt resuscitation (DNR)  Continuous    Question Answer Comment  In the event of cardiac or respiratory ARREST Do not  call a "  code blue"   In the event of cardiac or respiratory ARREST Do not perform Intubation, CPR, defibrillation or ACLS   In the event of cardiac or respiratory ARREST Use medication by any route, position, wound care, and other measures to relive pain and suffering. May use oxygen, suction and manual treatment of airway obstruction as needed for comfort.      11/25/19 1109        Code Status History    Date Active Date Inactive Code Status Order ID Comments User Context   11/23/2019 0749 11/25/2019 1109 Partial Code [id number]  Candee Furbish, MD Inpatient   11/19/2019 1526 11/23/2019 0749 Partial Code [id number]  Micheline Rough, MD Inpatient   11/07/2019 1800 11/19/2019 1526 Full Code [id number]  Marshell Garfinkel, MD ED   09/16/2019 1740 10/21/2019 2017 Full Code [id number]  Tanda Rockers, MD ED   05/24/2017 2004 05/28/2017 2035 Full Code [id number]  Etta Quill, DO ED   08/31/2015 1641 09/02/2015 2016 Full Code [id number]  Samella Parr, NP Inpatient   Advance Care Planning Activity    Advance Directive Documentation     Most Recent Value  Type of Advance Directive  Healthcare Power of Attorney, Living will  Pre-existing out of facility DNR order (yellow form or pink MOST form)  --  "MOST" Form in Place?  --       Prognosis:   Hours to days  Discharge Planning:  Anticipated Hospital Death  Care plan was discussed with bedside care team  Thank you for allowing the Palliative Medicine Team to assist in the care of this patient.   Time In: 0845 Time Out: 0900 Total Time 15 Prolonged Time Billed No       Greater than 50%  of this time was spent counseling and coordinating care related to the above assessment and plan.  Micheline Rough, MD  Please contact Palliative Medicine Team phone at [phone] for questions and concerns.

## 2019-11-30 NOTE — Progress Notes (Signed)
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 fib, dysphagia and PEG tube dependent with multiple recent hospitalization was admitted to the hospital with low urine output and altered mental status in the setting of underlying dementia and indwelling Foley catheter from home.  She was admitted to the intensive care unit and treated as septic shock, was somehow stabilized, again developed apnea needed intubation mechanical ventilation.  Treated with various measures with no meaningful recovery.  2/20-admitted with septic shock on Levophed.  Urine culture with Enterococcus.  Catheter exchanged.  Renal ultrasound was normal.   2/21 - rapid A. fib-given amnio bolus 2/22 - Rt midline cath  2/23 - Off pressors. Some moaning in pain per RN. Scr slightly improved. -0>CCM signed off with recommendation for hospice. 2/25 -rushed emergently to ICU from floor. Comatose and resp distress and hypoxemic. CXR with L sided collapse. Elink called CRNA and patient intubated without any RSI medications. Post Intubation CXR shows resolution of collapse. BP ok. CCM recalled.  2/27 am freq PAF >added beta blocker>bradycardic pm 2/27 so d/c'd  3/2 rapid afib, amio restarted 3/7 extubated and hospice care initiated.   Assessment & Plan:   Active Problems:   History of CVA with residual deficit left side   Hypothyroidism   Dementia (HCC)   Chronic orthostatic hypotension   Pressure injury of skin   Acute respiratory failure (HCC)   Failure to thrive in adult   Generalized weakness   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HCC)   Dysphagia    Malnutrition of moderate degree   Altered mental status, unspecified   Septic shock (HCC)   Enterococcus faecalis infection   Acute respiratory distress   Acute respiratory failure with hypoxemia (HCC)   Shortness of breath   Dying care  Patient is full comfort care and hospice level of care.  She is actively dying. All symptom control medications are available. RN to pronounce death if it happens in the hospital. Family not agreeable to transfer to inpatient hospice.   DVT prophylaxis: Comfort care Code Status: DNR Family Communication: None present at bedside Disposition Plan: patient is from home. Anticipated date in hospital, Barriers to discharge, family not agreeable to transfer to inpatient hospice.  Anticipate death in the hospital.   Consultants:   Multiple  Procedures:   Multiple  Antimicrobials:   None   Subjective: Patient seen and examined in the morning rounds.  She had some periods of apnea, medicated for comfort.  Objective: Vitals:   11/29/19 0622 11/29/19 1347 11/30/19 0500 11/30/19 0622  BP: (!) 99/58 (!) 87/39  (!) 138/57  Pulse: 98 93  73  Resp: (!) 24 (!) 22  19  Temp: 99.9 F (37.7 C) 98.9 F (37.2 C)  98.2 F (36.8 C)  TempSrc: Oral Oral  Oral  SpO2: (!) 75% (!) 78%  (!) 89%  Weight:   71.3 kg   Height:        Intake/Output Summary (Last 24 hours) at 11/30/2019 1340 Last data filed at 11/30/2019 0622 Gross per 24 hour  Intake --  Output 180 ml  Net -180 ml   Filed Weights   11/28/19 0500 11/29/19 0500 11/30/19 0500  Weight: 74.7  kg 71.5 kg 71.3 kg    Examination:  Physical Exam  Constitutional:  Comfortable after dose of Dilaudid. Not responding. Obtunded. Anasarca, PEG tube in the abdomen. Foley catheter with clear urine. Rectal tube with loose stool. Lower extremities with contractures.      Data Reviewed: I have personally reviewed following labs and imaging studies  CBC: No results for input(s): WBC,  NEUTROABS, HGB, HCT, MCV, PLT in the last 168 hours. Basic Metabolic Panel: No results for input(s): NA, K, CL, CO2, GLUCOSE, BUN, CREATININE, CALCIUM, MG, PHOS in the last 168 hours. GFR: Estimated Creatinine Clearance: 67 mL/min (by C-G formula based on SCr of 0.49 mg/dL). Liver Function Tests: No results for input(s): AST, ALT, ALKPHOS, BILITOT, PROT, ALBUMIN in the last 168 hours. No results for input(s): LIPASE, AMYLASE in the last 168 hours. No results for input(s): AMMONIA in the last 168 hours. Coagulation Profile: No results for input(s): INR, PROTIME in the last 168 hours. Cardiac Enzymes: No results for input(s): CKTOTAL, CKMB, CKMBINDEX, TROPONINI in the last 168 hours. BNP (last 3 results) No results for input(s): PROBNP in the last 8760 hours. HbA1C: No results for input(s): HGBA1C in the last 72 hours. CBG: Recent Labs  Lab 11/25/19 2033 11/26/19 0145 11/26/19 0410 11/26/19 0436 11/26/19 0818  GLUCAP 86 72 68* 99 74   Lipid Profile: No results for input(s): CHOL, HDL, LDLCALC, TRIG, CHOLHDL, LDLDIRECT in the last 72 hours. Thyroid Function Tests: No results for input(s): TSH, T4TOTAL, FREET4, T3FREE, THYROIDAB in the last 72 hours. Anemia Panel: No results for input(s): VITAMINB12, FOLATE, FERRITIN, TIBC, IRON, RETICCTPCT in the last 72 hours. Sepsis Labs: No results for input(s): PROCALCITON, LATICACIDVEN in the last 168 hours.  Recent Results (from the past 240 hour(s))  Culture, respiratory     Status: None   Collection Time: 11/21/19 11:31 AM   Specimen: Tracheal Aspirate  Result Value Ref Range Status   Specimen Description   Final    TRACHEAL ASPIRATE Performed at [facility] [address], [place], [place] [address]    Special Requests   Final    NONE Performed at [facility], [place] [address], [place], [place] [address]    Gram Stain   Final    MODERATE WBC PRESENT, PREDOMINANTLY PMN RARE SQUAMOUS  EPITHELIAL CELLS PRESENT FEW GRAM POSITIVE COCCI IN PAIRS Performed at [facility], [place] [address] [place], [place] [address]    Culture FEW METHICILLIN RESISTANT STAPHYLOCOCCUS AUREUS  Final   Report Status 11/23/2019 FINAL  Final   Organism ID, Bacteria METHICILLIN RESISTANT STAPHYLOCOCCUS AUREUS  Final      Susceptibility   Methicillin resistant staphylococcus aureus - MIC*    CIPROFLOXACIN >=8 RESISTANT Resistant     ERYTHROMYCIN >=8 RESISTANT Resistant     GENTAMICIN <=0.5 SENSITIVE Sensitive     OXACILLIN >=4 RESISTANT Resistant     TETRACYCLINE 2 SENSITIVE Sensitive     VANCOMYCIN 1 SENSITIVE Sensitive     TRIMETH/SULFA <=10 SENSITIVE Sensitive     CLINDAMYCIN >=8 RESISTANT Resistant     RIFAMPIN <=0.5 SENSITIVE Sensitive     Inducible Clindamycin NEGATIVE Sensitive     * FEW METHICILLIN RESISTANT STAPHYLOCOCCUS AUREUS         Radiology Studies: No results found.      Scheduled Meds: . mupirocin ointment   Nasal BID  . pantoprazole sodium  40 mg Per Tube Daily  . sodium chloride flush  10-40 mL Intracatheter Q12H  Continuous Infusions:   LOS: 19 days    Time spent: 25 minutes    [name], MD [facility] Pager [phone]

## 2019-12-01 NOTE — Progress Notes (Signed)
Daily Progress Note   Patient Name: Barbara Flowers       Date: 12/01/2019 DOB: Mar 20, 1951  Age: 69 y.o. MRN#: [id number] Attending Physician: [name], MD Primary Care Physician: [name], NP Admit Date: 10/28/2019  Reason for Consultation/Follow-up: Terminal Care and Withdrawal of life-sustaining treatment Comfort care, symptom management at end of life.   Subjective:  Received call from husband requesting visit to check in with him.    He was present at the bedside.  We discussed care plan and interventions for comfort.  I saw and examined Barbara Flowers.  She is actively dying.  No observed signs of distress during encounter.    Length of Stay: 20  Current Medications: Scheduled Meds:  . mupirocin ointment   Nasal BID  . pantoprazole sodium  40 mg Per Tube Daily  . sodium chloride flush  10-40 mL Intracatheter Q12H    Continuous Infusions:   PRN Meds: acetaminophen (TYLENOL) oral liquid 160 mg/5 mL, antiseptic oral rinse, glycopyrrolate **OR** glycopyrrolate **OR** glycopyrrolate, haloperidol **OR** haloperidol **OR** haloperidol lactate, hydrALAZINE, HYDROmorphone (DILAUDID) injection, lip balm, LORazepam **OR** LORazepam **OR** [DISCONTINUED] LORazepam, LORazepam, ondansetron **OR** ondansetron (ZOFRAN) IV, polyvinyl alcohol, sodium chloride flush  Physical Exam         Eyes closed.  Does not awaken to verbal or tactile stimulation. Shallow breathing. No secretions, but snoring noted. Has anasarca, has weeping from UE and LE due to 3rd spacing.  Has muscle wasting Wounds noted  Vital Signs: BP 119/77 (BP Location: Right Arm)   Pulse 98   Temp 98.6 F (37 C) (Oral)   Resp 18   Ht 5\' 8"  (1.727 m)   Wt 71.3 kg   SpO2 (!) 79%   BMI 23.90 kg/m  SpO2: SpO2:  (!) 79 % O2 Device: O2 Device: Room Air O2 Flow Rate: O2 Flow Rate (L/min): 1 L/min  Intake/output summary:   Intake/Output Summary (Last 24 hours) at 12/01/2019 1148 Last data filed at 12/01/2019 [id number] Gross per 24 hour  Intake 500 ml  Output --  Net 500 ml   LBM: Last BM Date: (Pt has flexiseal) Baseline Weight: Weight: 57.5 kg Most recent weight: Weight: 71.3 kg       Palliative Assessment/Data:    Flowsheet Rows     Most Recent Value  Intake Tab  Referral  Department  Critical care  Unit at Time of Referral  ICU  Palliative Care Primary Diagnosis  Sepsis/Infectious Disease  Date Notified  11/12/19  Palliative Care Type  Return patient Palliative Care  Reason for referral  Clarify Goals of Care  Date of Admission  10/29/2019  Date first seen by Palliative Care  11/12/19  # of days Palliative referral response time  0 Day(s)  # of days IP prior to Palliative referral  1  Clinical Assessment  Psychosocial & Spiritual Assessment  Palliative Care Outcomes      Patient Active Problem List   Diagnosis Date Noted  . Acute respiratory failure with hypoxemia ([place])   . Shortness of breath   . Dying care   . Acute respiratory distress   . Enterococcus faecalis infection 11/16/2019  . Septic shock ([place]) 11/03/2019  . Left spastic hemiparesis with h/o strokes 10/19/2019  . Malnutrition of moderate degree 10/19/2019  . Decreased functional mobility 10/17/2019  . Sacral decubitus ulcer, stage IV ([place]) 10/17/2019  . 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 Dysphagia 10/17/2019  . Kyphoscoliosis 10/17/2019  . H/O bone marrow transplant ([place])   . Palliative care by specialist   . Goals of care, counseling/discussion   . Generalized weakness   . Failure to thrive in adult   . Acute respiratory failure ([place])   . Pleural effusion   . Acute systolic heart failure ([place])   . Protein-calorie malnutrition, severe ([place]) 09/24/2019  . Pressure injury of skin 09/17/2019  .  CAP (community acquired pneumonia) 09/16/2019  . Pleural effusion on left 09/16/2019  . Acute prerenal azotemia 09/16/2019  . Hypernatremia 09/16/2019  . Altered mental status, unspecified 07/12/2017  . Diarrhea 05/24/2017  . Avascular necrosis of bone of hip, right ([place]) 11/23/2016  . Memory difficulty 11/23/2016  . Hallucination, visual 11/23/2016  . Cervical myelopathy ([place]) 06/13/2016  . Hypokalemia 06/12/2016  . 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[place]) 06/12/2016  . Parkinson's disease ([place]) 06/11/2016  . GI bleed 06/09/2016  . Chronic right hip pain 01/10/2016  . Physical debility 01/10/2016  . Wheelchair dependent 2017- 10/17/2015  . TIA (transient ischemic attack) 08/31/2015  . History of CVA with residual deficit left side 08/31/2015  . Hypothyroidism 08/31/2015  . OSA on CPAP 08/31/2015  . Dementia ([place]) 08/31/2015  . Chronic orthostatic hypotension 08/31/2015  . OAB (overactive bladder) 08/31/2015  . 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  . Peripheral neuropathy 08/31/2015  . Avascular necrosis of right femoral head ([place]) 08/31/2015  . Left carotid bruit 08/31/2015  . Anemia 08/31/2015    Palliative Care Assessment & Plan    Assessment: End of life care PPS 10%  Recommendations/Plan:  Continue current scope of comfort measures.  Husband at bedside at time of my visit today.  Anticipatory guidance provided.  He has my contact info and stated prior that he will call with any concerns.  Patient's family wishes for patient to continue with comfort care here in the hospital.  Prognosis likely hour to days at this point.      PMT to follow.   Goals of Care and Additional Recommendations:  Limitations on Scope of Treatment: Full Comfort Care  Code Status:    Code Status Orders  (From admission, onward)         Start     Ordered   11/25/19 1109  Do not attempt resuscitation (DNR)  Continuous    Question Answer Comment  In the event of cardiac or respiratory ARREST  Do  not call a "code blue"   In the event of cardiac or respiratory ARREST Do not perform Intubation, CPR, defibrillation or ACLS   In the event of cardiac or respiratory ARREST Use medication by any route, position, wound care, and other measures to relive pain and suffering. May use oxygen, suction and manual treatment of airway obstruction as needed for comfort.      11/25/19 1109        Code Status History    Date Active Date Inactive Code Status Order ID Comments User Context   11/23/2019 0749 11/25/2019 1109 Partial Code [id number]  Candee Furbish, MD Inpatient   11/19/2019 1526 11/23/2019 0749 Partial Code [id number]  Micheline Rough, MD Inpatient   11/16/2019 1800 11/19/2019 1526 Full Code [id number]  Marshell Garfinkel, MD ED   09/16/2019 1740 10/21/2019 2017 Full Code [id number]  Tanda Rockers, MD ED   05/24/2017 2004 05/28/2017 2035 Full Code [id number]  Etta Quill, DO ED   08/31/2015 1641 09/02/2015 2016 Full Code [id number]  Samella Parr, NP Inpatient   Advance Care Planning Activity    Advance Directive Documentation     Most Recent Value  Type of Advance Directive  Healthcare Power of Attorney, Living will  Pre-existing out of facility DNR order (yellow form or pink MOST form)  --  "MOST" Form in Place?  --       Prognosis:   Hours to days  Discharge Planning:  Anticipated Hospital Death  Care plan was discussed with bedside care team  Thank you for allowing the Palliative Medicine Team to assist in the care of this patient.   Time In: 1040 Time Out: 1055 Total Time 15 Prolonged Time Billed No       Greater than 50%  of this time was spent counseling and coordinating care related to the above assessment and plan.  Micheline Rough, MD  Please contact Palliative Medicine Team phone at [phone] for questions and concerns.

## 2019-12-01 NOTE — Progress Notes (Signed)
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 fib, dysphagia and PEG tube dependent with multiple recent hospitalization was admitted to the hospital with low urine output and altered mental status in the setting of underlying dementia and indwelling Foley catheter from home.  She was admitted to the intensive care unit and treated as septic shock, was somehow stabilized, again developed apnea needed intubation mechanical ventilation.  Treated with various measures with no meaningful recovery.  2/20-admitted with septic shock on Levophed.  Urine culture with Enterococcus.  Catheter exchanged.  Renal ultrasound was normal.   2/21 - rapid A. fib-given amnio bolus 2/22 - Rt midline cath  2/23 - Off pressors. Some moaning in pain per RN. Scr slightly improved. -0>CCM signed off with recommendation for hospice. 2/25 -rushed emergently to ICU from floor. Comatose and resp distress and hypoxemic. CXR with L sided collapse. Elink called CRNA and patient intubated without any RSI medications. Post Intubation CXR shows resolution of collapse. BP ok. CCM recalled.  2/27 am freq PAF >added beta blocker>bradycardic pm 2/27 so d/c'd  3/2 rapid afib, amio restarted 3/7 extubated and hospice care initiated.  She stayed in the hospital because family did not agree for her to transfer to inpatient hospice.   Assessment & Plan:   Active Problems:   History of CVA with residual deficit left side   Hypothyroidism   Dementia (HCC)   Chronic orthostatic hypotension   Pressure injury of skin   Acute respiratory failure (HCC)    Failure to thrive in adult   Generalized weakness   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HCC)   Dysphagia   Malnutrition of moderate degree   Altered mental status, unspecified   Septic shock (HCC)   Enterococcus faecalis infection   Acute respiratory distress   Acute respiratory failure with hypoxemia (HCC)   Shortness of breath   Dying care  Patient is full comfort care and hospice level of care.  She is actively dying. All symptom control medications are available. RN to pronounce death if it happens in the hospital. Family not agreeable to transfer to inpatient hospice.   DVT prophylaxis: Comfort care Code Status: DNR Family Communication: patient's husband at the bed side. Disposition Plan: patient is from home. Anticipated death in hospital, Barriers to discharge, family not agreeable to transfer to inpatient hospice.  Anticipate death in the hospital.   Consultants:   Multiple  Procedures:   Multiple  Antimicrobials:   None   Subjective: Patient seen and examined. Remains obtunded. Mostly unresponsive now . Husband at the bedside. Discussed with husband at bedside. He and family ready to accept death with dignity and waiting for Kida to be peaceful. He was very appreciative of teams care including ICU team.  Objective: Vitals:   11/30/19 0622 11/30/19 2208 12/01/19 0512 12/01/19 1413  BP: (!) 138/57 (!) 104/56 119/77 (!) 113/55  Pulse: 73 (!) 103 98 96  Resp: 19 16 18 20   Temp: 98.2 F (36.8 C) 98 F (36.7 C) 98.6 F (37 C) 97.7 F (36.5 C)  TempSrc: Oral Oral Oral Oral  SpO2: (!) 89% (!) 83% (!) 79% (!) 88%  Weight:      Height:  Intake/Output Summary (Last 24 hours) at 12/01/2019 1504 Last data filed at 12/01/2019 0907 Gross per 24 hour  Intake 500 ml  Output --  Net 500 ml   Filed Weights   11/28/19 0500 11/29/19 0500 11/30/19 0500  Weight: 74.7 kg 71.5 kg 71.3 kg    Examination:  Physical Exam  Constitutional:  Comfortable after dose of  Dilaudid. Not responding. Obtunded. Anasarca, PEG tube in the abdomen. Foley catheter with clear urine. Rectal tube with loose stool. Lower extremities with contractures.      Data Reviewed: I have personally reviewed following labs and imaging studies  CBC: No results for input(s): WBC, NEUTROABS, HGB, HCT, MCV, PLT in the last 168 hours. Basic Metabolic Panel: No results for input(s): NA, K, CL, CO2, GLUCOSE, BUN, CREATININE, CALCIUM, MG, PHOS in the last 168 hours. GFR: Estimated Creatinine Clearance: 67 mL/min (by C-G formula based on SCr of 0.49 mg/dL). Liver Function Tests: No results for input(s): AST, ALT, ALKPHOS, BILITOT, PROT, ALBUMIN in the last 168 hours. No results for input(s): LIPASE, AMYLASE in the last 168 hours. No results for input(s): AMMONIA in the last 168 hours. Coagulation Profile: No results for input(s): INR, PROTIME in the last 168 hours. Cardiac Enzymes: No results for input(s): CKTOTAL, CKMB, CKMBINDEX, TROPONINI in the last 168 hours. BNP (last 3 results) No results for input(s): PROBNP in the last 8760 hours. HbA1C: No results for input(s): HGBA1C in the last 72 hours. CBG: Recent Labs  Lab 11/25/19 2033 11/26/19 0145 11/26/19 0410 11/26/19 0436 11/26/19 0818  GLUCAP 86 72 68* 99 74   Lipid Profile: No results for input(s): CHOL, HDL, LDLCALC, TRIG, CHOLHDL, LDLDIRECT in the last 72 hours. Thyroid Function Tests: No results for input(s): TSH, T4TOTAL, FREET4, T3FREE, THYROIDAB in the last 72 hours. Anemia Panel: No results for input(s): VITAMINB12, FOLATE, FERRITIN, TIBC, IRON, RETICCTPCT in the last 72 hours. Sepsis Labs: No results for input(s): PROCALCITON, LATICACIDVEN in the last 168 hours.  No results found for this or any previous visit (from the past 240 hour(s)).       Radiology Studies: No results found.      Scheduled Meds: . mupirocin ointment   Nasal BID  . pantoprazole sodium  40 mg Per Tube Daily  .  sodium chloride flush  10-40 mL Intracatheter Q12H   Continuous Infusions:   LOS: 20 days    Time spent: 25 minutes    [name], MD [facility] Pager [phone]

## 2019-12-02 DIAGNOSIS — Z9010 Acquired absence of unspecified breast and nipple: Secondary | ICD-10-CM

## 2019-12-02 DIAGNOSIS — Z978 Presence of other specified devices: Secondary | ICD-10-CM

## 2019-12-02 DIAGNOSIS — I639 Cerebral infarction, unspecified: Secondary | ICD-10-CM | POA: Diagnosis present

## 2019-12-02 DIAGNOSIS — I513 Intracardiac thrombosis, not elsewhere classified: Secondary | ICD-10-CM

## 2019-12-02 DIAGNOSIS — G8190 Hemiplegia, unspecified affecting unspecified side: Secondary | ICD-10-CM

## 2019-12-02 DIAGNOSIS — I671 Cerebral aneurysm, nonruptured: Secondary | ICD-10-CM | POA: Diagnosis present

## 2019-12-02 DIAGNOSIS — C50919 Malignant neoplasm of unspecified site of unspecified female breast: Secondary | ICD-10-CM | POA: Diagnosis present

## 2019-12-02 DIAGNOSIS — I480 Paroxysmal atrial fibrillation: Secondary | ICD-10-CM | POA: Diagnosis present

## 2019-12-02 DIAGNOSIS — A419 Sepsis, unspecified organism: Secondary | ICD-10-CM

## 2019-12-02 MED ORDER — GLYCOPYRROLATE 0.2 MG/ML IJ SOLN: 0.2000 mg | INTRAMUSCULAR | Status: DC | PRN

## 2019-12-02 MED ORDER — GLYCOPYRROLATE 0.2 MG/ML IJ SOLN
0.4000 mg | INTRAMUSCULAR | Status: DC | PRN
  Administered 2019-12-02: 0.4 mg via INTRAVENOUS
  Filled 2019-12-02: qty 2

## 2019-12-02 MED ORDER — GLYCOPYRROLATE 1 MG PO TABS
1.0000 mg | ORAL_TABLET | ORAL | Status: DC | PRN
  Filled 2019-12-02: qty 1

## 2019-12-02 MED ORDER — SCOPOLAMINE 1 MG/3DAYS TD PT72
1.0000 | MEDICATED_PATCH | TRANSDERMAL | Status: DC
  Administered 2019-12-02: 1.5 mg via TRANSDERMAL
  Filled 2019-12-02: qty 1

## 2019-12-21 NOTE — Progress Notes (Signed)
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 fib, dysphagia and PEG tube dependent with multiple recent hospitalization was admitted to the hospital with low urine output and altered mental status in the setting of underlying dementia and indwelling Foley catheter from home. She was admitted to the intensive care unit and treated as septic shock, was somehow stabilized, again developed apnea needed intubation mechanical ventilation. Treated with various measures with no meaningful recovery.  2/20-admitted with septic shock on Levophed. Urine culture with Enterococcus. Catheter exchanged. Renal ultrasound was normal.  2/21 - rapid A. fib-given amnio bolus 2/22 - Rt midline cath  2/23 - Off pressors. Some moaning in pain per RN. Scr slightly improved. -0>CCM signed off with recommendation for hospice. 2/25 -rushed emergently to ICU from floor. Comatose and resp distress and hypoxemic. CXR with L sided collapse. Elink called CRNA and patient intubated without any RSI medications. Post Intubation CXR shows resolution of collapse. BP ok. CCM recalled.  2/27 am freq PAF >added beta blocker>bradycardic pm 2/27 so d/c'd  3/2 rapid afib, amio restarted 3/7 extubated and hospice care initiated.  She stayed in the hospital because family did not agree for her to transfer to inpatient hospice.  Dec 23, 2019: Seen and examined.  Rhonchorous sounds noted.  Scopolamine patch added.  Shallow breath noted.  Anticipated hospital bed.  Assessment/Plan: Active Problems:   History of CVA with residual deficit left side    Hypothyroidism   Dementia (HCC)   Chronic orthostatic hypotension   Pressure injury of skin   Acute respiratory failure (HCC)   Failure to thrive in adult   Generalized weakness   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HCC)   Dysphagia   Malnutrition of moderate degree   Altered mental status, unspecified   Septic shock (HCC)   Enterococcus faecalis infection   Acute respiratory distress   Acute respiratory failure with hypoxemia (HCC)   Shortness of breath   Dying care  Patient is full comfort care and hospice level of care.  She is actively dying. All symptom control medications are available. RN to pronounce death if it happens in the hospital.  No family members at bedside this morning.  All focus on comfort care only.   DVT prophylaxis: Comfort care Code Status: DNR Family Communication:  No family members at bedside. Disposition Plan: patient is from home. Anticipated death in hospital, Barriers to discharge, family not agreeable to transfer to inpatient hospice.  Anticipate death in the hospital.     Objective: Vitals:   12/01/19 0512 12/01/19 1413 12-23-19 0500 Dec 23, 2019 0632  BP: 119/77 (!) 113/55  (!) 134/52  Pulse: 98 96  89  Resp: 18 20  18   Temp: 98.6 F (37 C) 97.7 F (36.5 C)  98.7 F (37.1 C)  TempSrc: Oral Oral  Oral  SpO2: (!) 79% (!) 88%  93%  Weight:   71.1 kg   Height:        Intake/Output Summary (Last 24 hours) at 12-23-19 0942 Last data filed at Dec 23, 2019 0600 Gross per 24 hour  Intake 100 ml  Output 735 ml  Net -635 ml   Filed Weights   11/29/19 0500 11/30/19 0500 12-23-19 0500  Weight: 71.5 kg 71.3  kg 71.1 kg    Exam:  . General: 69 y.o. year-old female severely ill-appearing.  Shallow breaths noted.  Cardiovascular: Regular rate and rhythm with no rubs or gallops.  Marland Kitchen Respiratory: Diffuse rhonchorous sounds noted.  Shallow breaths.   . Psychiatry: End-stage of life.  Patient is actively dying.   Data Reviewed: CBC: No results for  input(s): WBC, NEUTROABS, HGB, HCT, MCV, PLT in the last 168 hours. Basic Metabolic Panel: No results for input(s): NA, K, CL, CO2, GLUCOSE, BUN, CREATININE, CALCIUM, MG, PHOS in the last 168 hours. GFR: Estimated Creatinine Clearance: 67 mL/min (by C-G formula based on SCr of 0.49 mg/dL). Liver Function Tests: No results for input(s): AST, ALT, ALKPHOS, BILITOT, PROT, ALBUMIN in the last 168 hours. No results for input(s): LIPASE, AMYLASE in the last 168 hours. No results for input(s): AMMONIA in the last 168 hours. Coagulation Profile: No results for input(s): INR, PROTIME in the last 168 hours. Cardiac Enzymes: No results for input(s): CKTOTAL, CKMB, CKMBINDEX, TROPONINI in the last 168 hours. BNP (last 3 results) No results for input(s): PROBNP in the last 8760 hours. HbA1C: No results for input(s): HGBA1C in the last 72 hours. CBG: Recent Labs  Lab 11/25/19 2033 11/26/19 0145 11/26/19 0410 11/26/19 0436 11/26/19 0818  GLUCAP 86 72 68* 99 74   Lipid Profile: No results for input(s): CHOL, HDL, LDLCALC, TRIG, CHOLHDL, LDLDIRECT in the last 72 hours. Thyroid Function Tests: No results for input(s): TSH, T4TOTAL, FREET4, T3FREE, THYROIDAB in the last 72 hours. Anemia Panel: No results for input(s): VITAMINB12, FOLATE, FERRITIN, TIBC, IRON, RETICCTPCT in the last 72 hours. Urine analysis:    Component Value Date/Time   COLORURINE ORANGE (A) 11/18/2019 1555   APPEARANCEUR TURBID (A) 11/01/2019 1555   LABSPEC 1.015 10/30/2019 1555   PHURINE 8.5 (H) 11/15/2019 1555   GLUCOSEU NEGATIVE 10/31/2019 1555   HGBUR LARGE (A) 11/14/2019 1555   BILIRUBINUR NEGATIVE 10/28/2019 [place] 11/01/2019 1555   PROTEINUR 100 (A) 10/24/2019 1555   NITRITE NEGATIVE 10/29/2019 1555   LEUKOCYTESUR LARGE (A) 11/13/2019 1555   Sepsis Labs: @LABRCNTIP (procalcitonin:4,lacticidven:4)  )No results found for this or any previous visit (from the past 240 hour(s)).     Studies: No results found.  Scheduled Meds: . scopolamine  1 patch Transdermal Q72H    Continuous Infusions:   LOS: 21 days     Kayleen Memos, MD [facility] Pager [phone]  If 7PM-7AM, please contact night-coverage [email or web address] Password [facility] 12/15/19, 9:42 AM

## 2019-12-21 NOTE — Progress Notes (Signed)
Palliative Care Death Note  Arrived at room to check in on Barbara Flowers.  On entering room, noted that she was not breathing.   Exam:  No response to verbal or tactile stimulation Pupils fixed and dilated No corneal reflex No palpable central or peripheral pulses No heart of breath sounds auscultated for > 1 minute  Time of death: [id number]  I called and notified her husband, Barbara Flowers.  He reports that they have made arrangements with [employer] and [employer] funeral home.  Total time: 15 minutes  Greater than 50%  of this time was spent counseling and coordinating care related to the above assessment and plan.  Micheline Rough, MD [place] [facility] [phone]

## 2019-12-21 NOTE — Progress Notes (Signed)
Pt pronounced dead at 36 this day by Dr. Micheline Rough. 2 RN's also verified Mollie Germany, RN and Katha Cabal, RN). Spouse of pt notified by Dr. Domingo Cocking. Dr. Irene Pap present; will complete death certificate.

## 2019-12-21 NOTE — Death Summary Note (Addendum)
Death Summary  Barbara Flowers [id number] DOB: 01-May-1951 DOA: December 02, 2019  PCP: [name], NP  Admit date: 12/04/2019 Date of Death: Dec 23, 2019 Time of Death: 01-02-25 Notification: [name], NP notified of death of 12-23-2019   History of present illness:  Barbara Flowers is a 69 y.o. 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 fib, dysphagia and PEG tube dependent with multiple recent hospitalization was admitted to the hospital with low urine output and altered mental status in the setting of underlying dementia and indwelling Foley catheter from home. She was admitted to the intensive care unit and treated as septic shock 2/2 Enterococcous Faecalis UTI, poa, was somehow stabilized, again developed apnea needed intubation mechanical ventilation. Treated with various measures with no meaningful recovery.  2/20-admitted with septic shock on Levophed. Urine culture with Enterococcus Faecalis. Catheter exchanged. Renal ultrasound was normal.  2/21 - rapid A. fib-given amnio bolus 2/22 - Rt midline cath  2/23 - Off pressors. Some moaning in pain per RN. Scr slightly improved. CCM signed off with recommendation for hospice. 2/25 -rushed emergently to ICU from floor. Comatose and resp distress and hypoxemic. CXR with L sided collapse. Elink called CRNA and patient intubated without any RSI medications. Post Intubation CXR shows resolution of collapse. BP ok. CCM recalled.  2/27 am freq PAF >added beta blocker>bradycardic pm 2/27 so d/c'd  3/2 rapid afib, amio restarted 3/7 extubated and hospice care initiated.  All focus on comfort care.  On 2019-12-23 [name] expired at 82 at [facility].    Final Diagnoses:  1.    Cardiopulmonary arrest 2.   Failure to thrive 3.   Recurrent aspiration 4.   A-Fib with RvR  Active Problems:   History of CVA with residual deficit left side   Hypothyroidism   Dementia (HCC)   Chronic orthostatic hypotension   Pressure injury of skin   Acute respiratory failure (HCC)   Failure to thrive in adult   Generalized weakness   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HCC)   Dysphagia   Malnutrition of moderate degree   Altered mental status, unspecified   Septic shock (HCC)   Enterococcus faecalis urinary tract infection   Acute respiratory distress   Acute respiratory failure with hypoxemia (HCC)   Shortness of breath   Dying care   Breast cancer ([place])   H/O mastectomy   Brain aneurysm   CVA (cerebral vascular accident) ([place])   Hemiplegia ([place])   LV (left ventricular) mural thrombus   Paroxysmal A-fib (HCC)   Presence of indwelling Foley catheter   Severe sepsis with septic shock ([place])   The results of significant diagnostics from this hospitalization (including imaging, microbiology, ancillary and laboratory) are listed below for reference.    Significant Diagnostic Studies: DG Chest 2 View  Result Date: 12/06/2019 CLINICAL DATA:  Altered mental status EXAM: CHEST - 2 VIEW COMPARISON:  Chest radiograph dated 10/12/2019 FINDINGS: The patient is rotated to the left. The heart size is within normal limits. Vascular calcifications are seen in the aortic arch. Lucency over the left lung base may reflect a small pneumothorax on this supine exam or be secondary to patient positioning. There is no right pneumothorax. There is no pleural effusion or focal consolidation. Degenerative changes are seen in both shoulders. IMPRESSION: Lucency over the left lung base may reflect a small pneumothorax on  this supine exam or be secondary to patient positioning. This could be confirmed with an upright chest radiograph. These results will be called to the ordering clinician or representative by the  Radiologist Assistant, and communication documented in the PACS or zVision Dashboard. Electronically Signed   By: Zerita Boers M.D.   On: 11/14/2019 12:41   US RENAL  Result Date: 10/26/2019 CLINICAL DATA:  70 year old female with altered mental status. Acute renal insufficiency. EXAM: RENAL / URINARY TRACT ULTRASOUND COMPLETE COMPARISON:  None. FINDINGS: Evaluation is limited due to body habitus and overlying bowel gas. Right Kidney: Renal measurements: 10.2 x 4.5 x 5.2 cm = volume: 125 mL. Normal echogenicity. No hydronephrosis or shadowing stone. Left Kidney: Renal measurements: 9.6 x 4.9 x 4.5 cm = volume: 111 mL. Normal echogenicity. No hydronephrosis or shadowing stone. Bladder: The urinary bladder is partially distended despite presence of a Foley catheter. Other: None. IMPRESSION: 1. No hydronephrosis or shadowing stone. 2. Partially distended urinary bladder despite presence of a Foley catheter. Electronically Signed   By: Anner Crete M.D.   On: 11/15/2019 17:11   DG Chest Port 1 View  Result Date: 11/22/2019 CLINICAL DATA:  Respiratory failure. EXAM: PORTABLE CHEST 1 VIEW COMPARISON:  Single-view of the chest 11/20/2019 and 11/17/2019. FINDINGS: Endotracheal tube remains in place. Left pleural effusion and airspace disease appear worse than on the most recent comparison. The right lung is clear. No pneumothorax. Heart size is normal. IMPRESSION: Increased left pleural effusion and basilar airspace disease which could be atelectasis or pneumonia. Electronically Signed   By: Inge Rise M.D.   On: 11/22/2019 08:17   DG Chest Port 1 View  Result Date: 11/20/2019 CLINICAL DATA:  Acute respiratory failure EXAM: PORTABLE CHEST 1 VIEW COMPARISON:  November 17, 2019 FINDINGS: The heart size and mediastinal contours are unchanged. ET tube is 3.2 cm above the carina. There is hyperinflation of the right lung. Again noted are mildly increased interstitial markings in the left lung and small left  pleural effusion, slightly improved from the prior exam. Overlying surgical clips seen in the left hemithorax. No acute osseous abnormality. IMPRESSION: ETT in satisfactory position. Slight interval improvement in the interstitial opacities and small left pleural effusion. Electronically Signed   By: Prudencio Pair M.D.   On: 11/20/2019 05:37   DG CHEST PORT 1 VIEW  Result Date: 11/17/2019 CLINICAL DATA:  Status post intubation. EXAM: PORTABLE CHEST 1 VIEW COMPARISON:  One-view chest x-ray 11/17/19 at 3:25 a.m. Chest x-ray 11/16/2019 at 2:11 p.m. FINDINGS: Heart is mildly enlarged. Atherosclerotic calcifications are present at the aortic arch. Endotracheal tube is now present, terminating 3.5 cm above the carina. Aeration of the left lung is improved. Left pleural effusion is again seen. Interstitial and airspace opacities remain. Right lung is clear. Degenerative changes are again noted at both shoulders. IMPRESSION: 1. Satisfactory positioning of the endotracheal tube. 2. Improved aeration of the left lung. 3. Persistent left-sided airspace disease and effusion. Infection is not excluded. Electronically Signed   By: San Morelle M.D.   On: 11/17/2019 04:46   DG Chest Port 1 View  Result Date: 11/17/2019 CLINICAL DATA:  Hypoxia. EXAM: PORTABLE CHEST 1 VIEW COMPARISON:  11/16/2019 FINDINGS: Interval subtotal collapse of the left lung. Mediastinal shift towards the left. Right lung remains clear. IMPRESSION: Marked worsening of atelectasis/pneumonia in the left lung with subtotal collapse. These results will be called to the ordering clinician or representative by the Radiologist Assistant, and communication documented in the PACS or  zVision Dashboard. Electronically Signed   By: Nelson Chimes M.D.   On: 11/17/2019 03:38   DG CHEST PORT 1 VIEW  Result Date: 11/16/2019 CLINICAL DATA:  Shortness of breath.  History of breast cancer. EXAM: PORTABLE CHEST 1 VIEW COMPARISON:  Chest x-ray 11/13/2019.  FINDINGS: Mediastinum hilar structures normal. Cardiomegaly. No pulmonary venous congestion. Low lung volumes. Left base atelectasis/infiltrate noted. Left-sided pleural effusion noted. Postsurgical changes left chest. Radiopacities again noted over the left breast. Stable deformity left scapula. Severe degenerative change again noted of both shoulders. Degenerative changes thoracic spine. IMPRESSION: 1.  Cardiomegaly.  No pulmonary venous congestion. 2. Low lung volumes. Left base atelectasis/infiltrate and left-sided pleural effusion noted. Electronically Signed   By: Marcello Moores  Register   On: 11/16/2019 14:40   DG Chest Port 1 View  Result Date: 11/13/2019 CLINICAL DATA:  Acute respiratory failure EXAM: PORTABLE CHEST 1 VIEW COMPARISON:  Radiograph 10/25/2019 FINDINGS: Air lucency along the left chest wall is likely contained within the skin folds between the chest wall and left upper extremity. Increasing hazy opacity in the left lung base. May reflect worsening atelectatic change given elevation of the left hemidiaphragm. No visible pneumothorax or effusion. Cardiomediastinal silhouette is unchanged from prior. Severe degenerative changes in the shoulders. More mild degenerative changes in the spine. Postsurgical changes noted in the left axilla. No acute osseous or soft tissue abnormality. IMPRESSION: Increasing hazy opacity in the left lung base, which may reflect worsening atelectatic change given elevation of the left hemidiaphragm. Some air lucency along the left chest wall likely trapped within skin folds. Electronically Signed   By: Lovena Le M.D.   On: 11/13/2019 05:03   DG Chest Port 1 View  Result Date: 11/12/2019 CLINICAL DATA:  Abnormal x-ray with concern for pneumothorax EXAM: PORTABLE CHEST 1 VIEW COMPARISON:  Yesterday FINDINGS: Kyphotic positioning with chin over the right more than left apex. No visible pneumothorax. When allowing for interstitial crowding there is no convincing  airspace disease. Postoperative left axilla. Normal heart size and mediastinal contours. 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 There is asymmetric left-sided shoulder girdle osteopenia. Suspect findings are sequela of remote radiotherapy. IMPRESSION: No acute finding including evidence of pneumothorax. Electronically Signed   By: Monte Fantasia M.D.   On: 10/24/2019 13:22   [place] EKG SITE RITE  Result Date: 11/21/2019 If Site Rite image not attached, placement could not be confirmed due to current cardiac rhythm.   Microbiology: No results found for this or any previous visit (from the past 240 hour(s)).   Labs: Basic Metabolic Panel: No results for input(s): NA, K, CL, CO2, GLUCOSE, BUN, CREATININE, CALCIUM, MG, PHOS in the last 168 hours. Liver Function Tests: No results for input(s): AST, ALT, ALKPHOS, BILITOT, PROT, ALBUMIN in the last 168 hours. No results for input(s): LIPASE, AMYLASE in the last 168 hours. No results for input(s): AMMONIA in the last 168 hours. CBC: No results for input(s): WBC, NEUTROABS, HGB, HCT, MCV, PLT in the last 168 hours. Cardiac Enzymes: No results for input(s): CKTOTAL, CKMB, CKMBINDEX, TROPONINI in the last 168 hours. D-Dimer No results for input(s): DDIMER in the last 72 hours. BNP: Invalid input(s): POCBNP CBG: Recent Labs  Lab 11/25/19 2033 11/26/19 0145 11/26/19 0410 11/26/19 0436 11/26/19 0818  GLUCAP 86 72 68* 99 74   Anemia work up No results for input(s): VITAMINB12, FOLATE, FERRITIN, TIBC, IRON, RETICCTPCT in the last 72 hours. Urinalysis    Component Value Date/Time   COLORURINE ORANGE (A) 10/29/2019 1555  APPEARANCEUR TURBID (A) 10/27/2019 1555   LABSPEC 1.015 11/16/2019 1555   PHURINE 8.5 (H) 10/28/2019 1555   GLUCOSEU NEGATIVE 10/29/2019 1555   HGBUR LARGE (A) 11/15/2019 1555   BILIRUBINUR NEGATIVE 11/03/2019 1555   KETONESUR NEGATIVE 10/25/2019 1555   PROTEINUR 100 (A) 11/15/2019 1555   NITRITE NEGATIVE  11/17/2019 1555   LEUKOCYTESUR LARGE (A) 11/05/2019 1555   Sepsis Labs Invalid input(s): PROCALCITONIN,  WBC,  LACTICIDVEN     SIGNED:  Kayleen Memos, MD  [facility] 08-Dec-2019, 4:12 PM Pager   If 7PM-7AM, please contact night-coverage [email or web address] Password [email or web address]

## 2019-12-21 DEATH — deceased

## 2020-04-25 IMAGING — CT CT HEAD W/O CM
3 series · 15 of 47 positions shown, 18 images · non-contrast
Comparison: CT head 10/07/2019

CLINICAL DATA: Altered mental status.  Dysphagia

EXAM:
CT HEAD WITHOUT CONTRAST
TECHNIQUE: Contiguous axial images were obtained from the base of the skull
through the vertex without intravenous contrast.

[Series 2: head 5.0 h31s · axial · 0.40mm/px · z∈[-121,+19]mm · 9 of 34 slices shown, 12 images]
[im 3/34  brain]
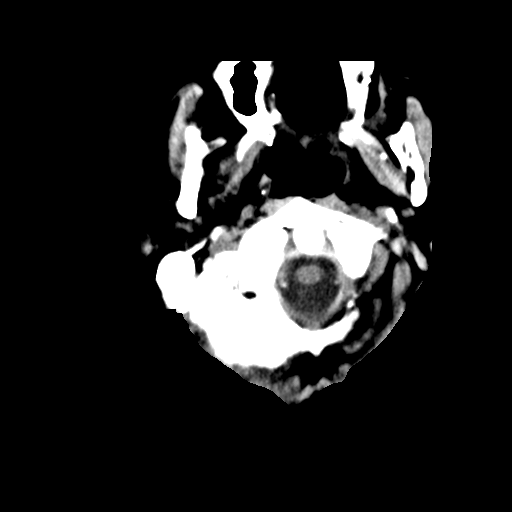
[im 3/34  bone]
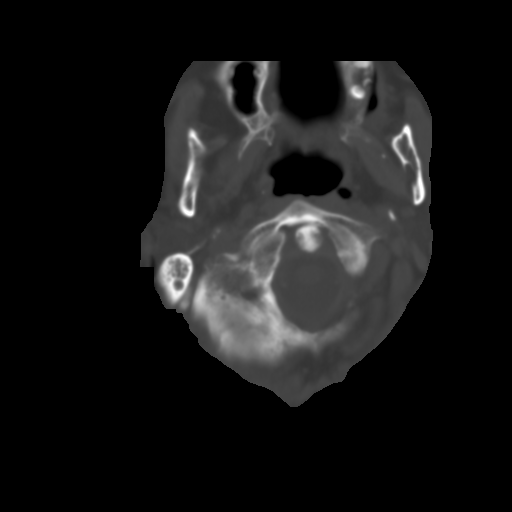
[im 6/34  brain]
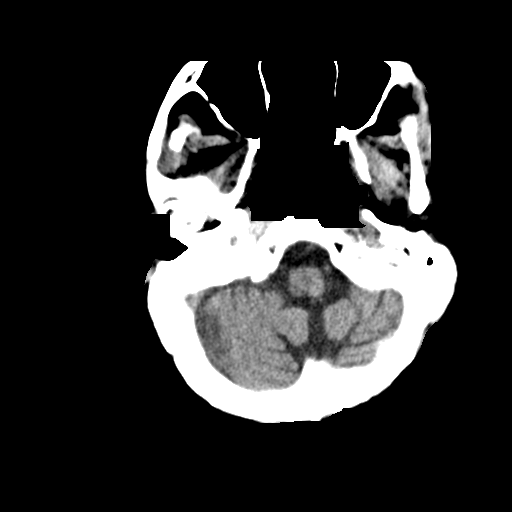
[im 10/34  brain]
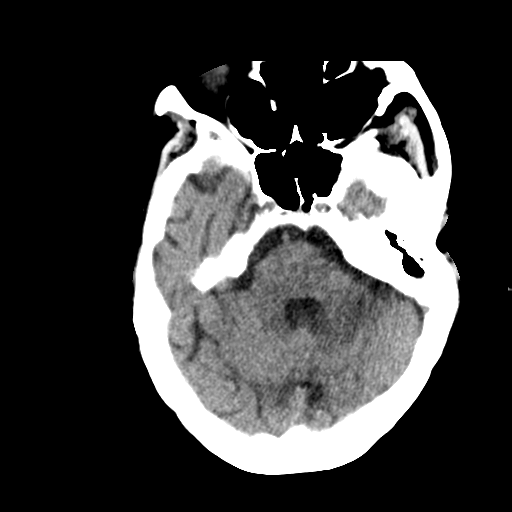
[im 13/34  brain]
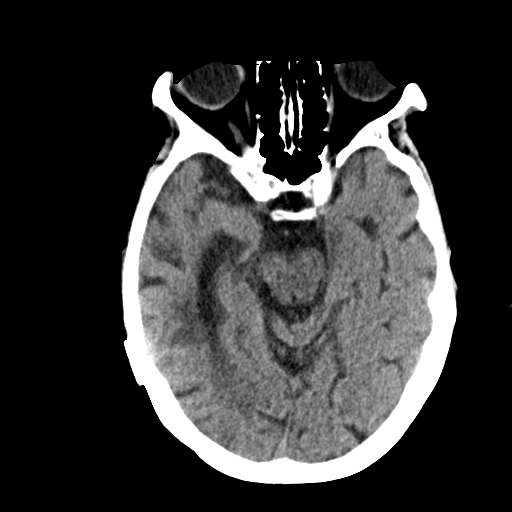
[im 18/34  brain]
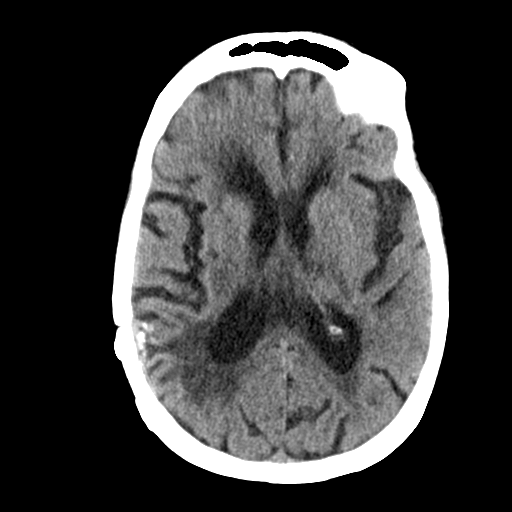
[im 18/34  bone]
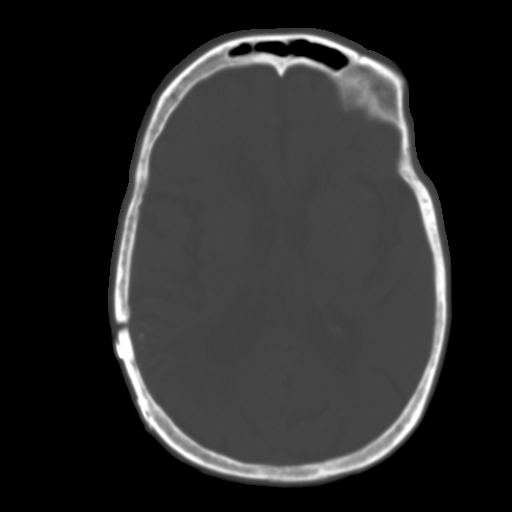
[im 21/34  brain]
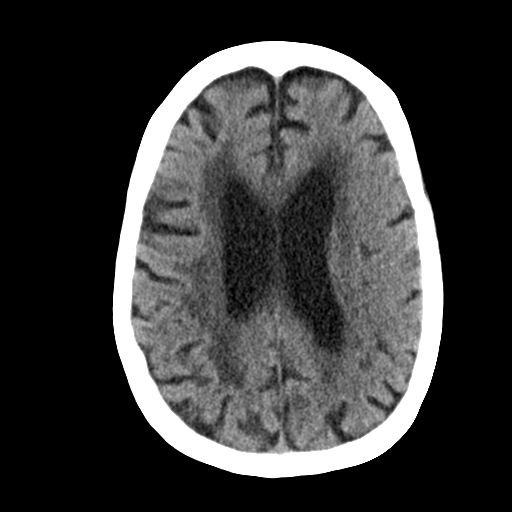
[im 24/34  brain]
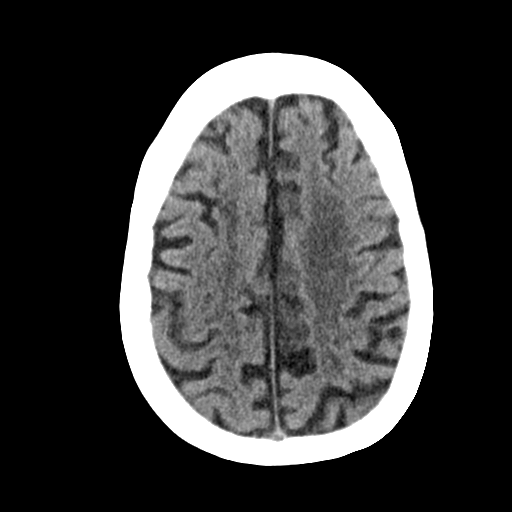
[im 28/34  brain]
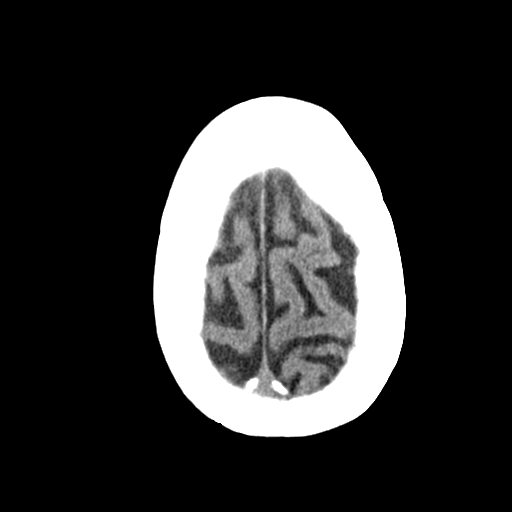
[im 31/34  brain]
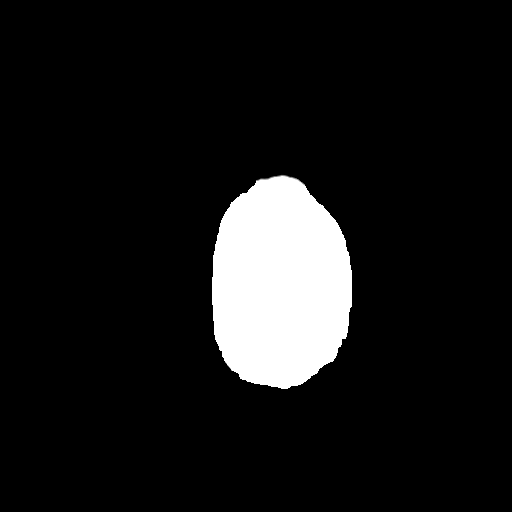
[im 31/34  bone]
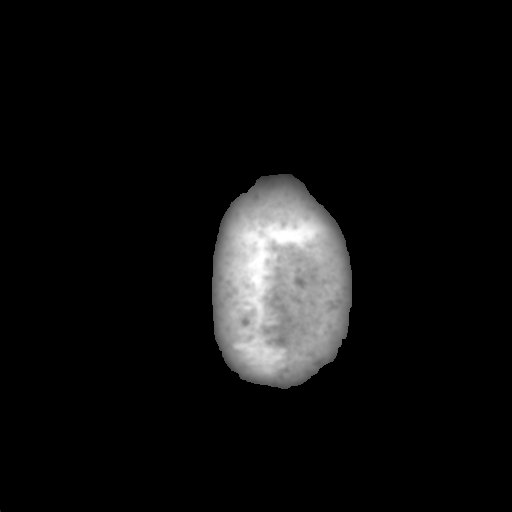

[Series 3: head 5.0 mpr cor · coronal · 0.31mm/px · 3 of 45 slices shown]
[im 15/45  brain]
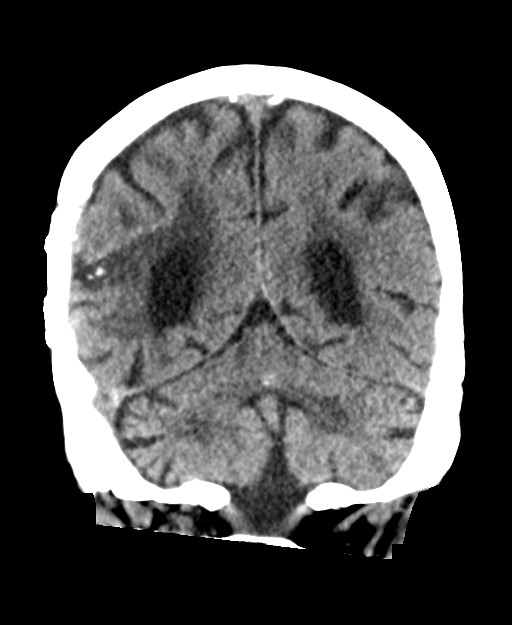
[im 20/45  brain]
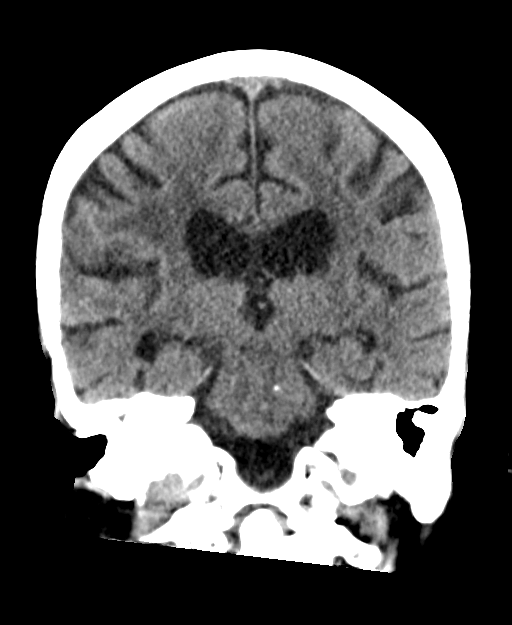
[im 25/45  brain]
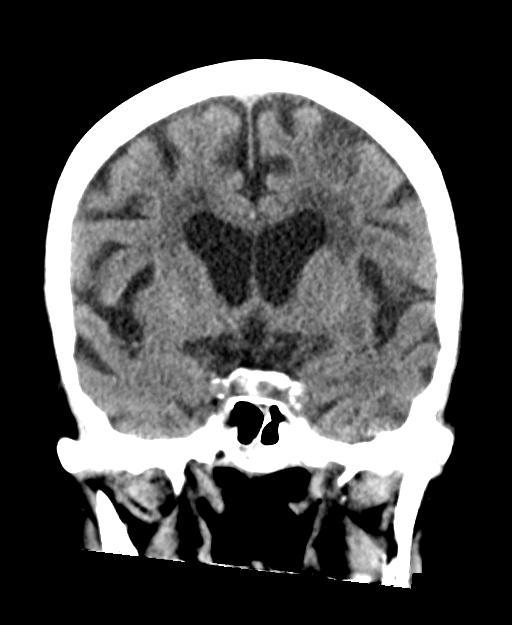

[Series 4: head 5.0 mpr sag · sagittal · 0.37mm/px · 3 of 30 slices shown]
[im 10/30  brain]
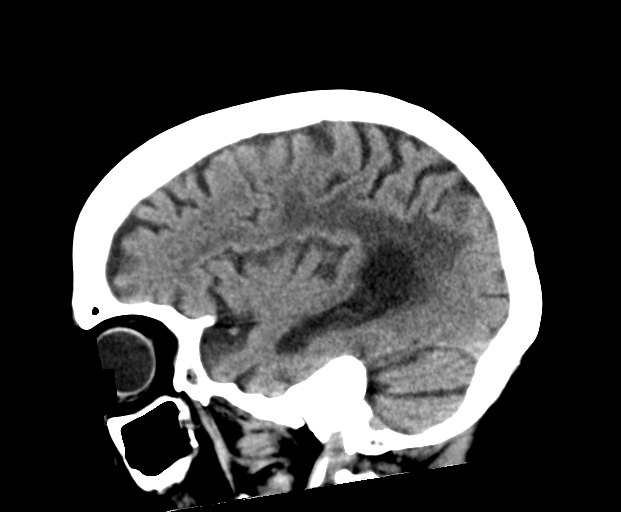
[im 15/30  brain]
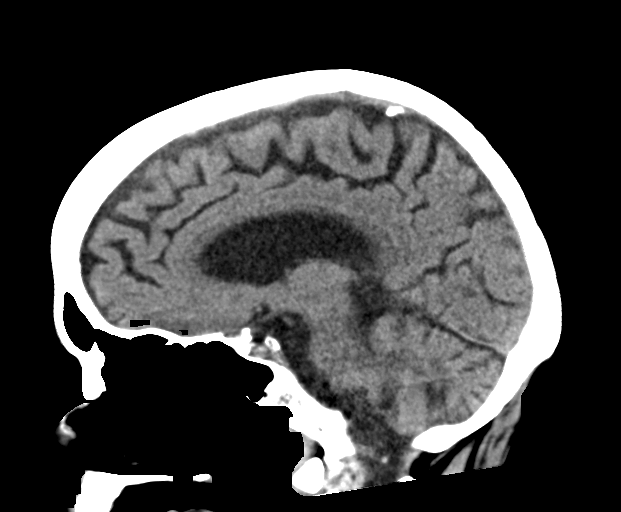
[im 20/30  brain]
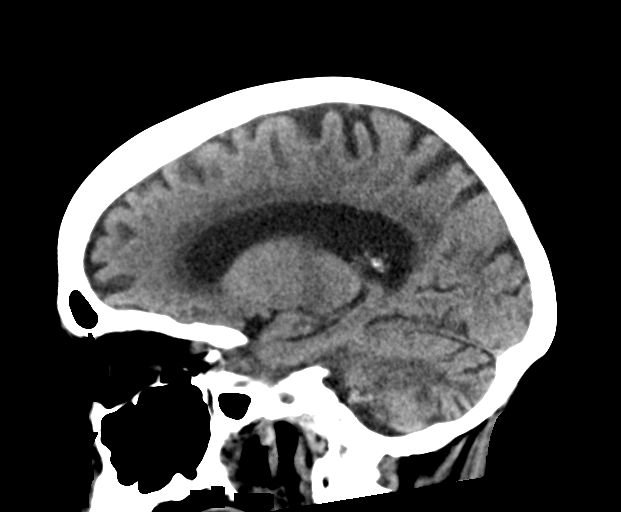

[15 of 47 positions shown; findings below may reference images not displayed]

FINDINGS: Brain: Moderate to advanced atrophy. Chronic microvascular ischemic
changes in the white matter. Chronic ischemic change in the pons.
Chronic infarct left thalamus. Chronic ischemic changes in the
cerebellum bilaterally.

Right parietal craniotomy. Parenchymal calcification at the
craniotomy site is chronic and unchanged.

Negative for acute infarct, hemorrhage, mass

Vascular: Normal arterial flow voids.

Skull: Right parietal craniotomy.

Sinuses/Orbits: Mild mucosal edema paranasal sinuses. Bilateral
mastoid effusion. Bilateral cataract extraction.

Other: None
IMPRESSION: Atrophy and extensive chronic ischemia. No acute abnormality and no
change from the prior CT.

## 2020-05-20 IMAGING — DX DG CHEST 1V PORT
1 series · 1 of 1 positions shown · non-contrast
Comparison: Yesterday

CLINICAL DATA: Abnormal x-ray with concern for pneumothorax

EXAM:
PORTABLE CHEST 1 VIEW

[chest ap]
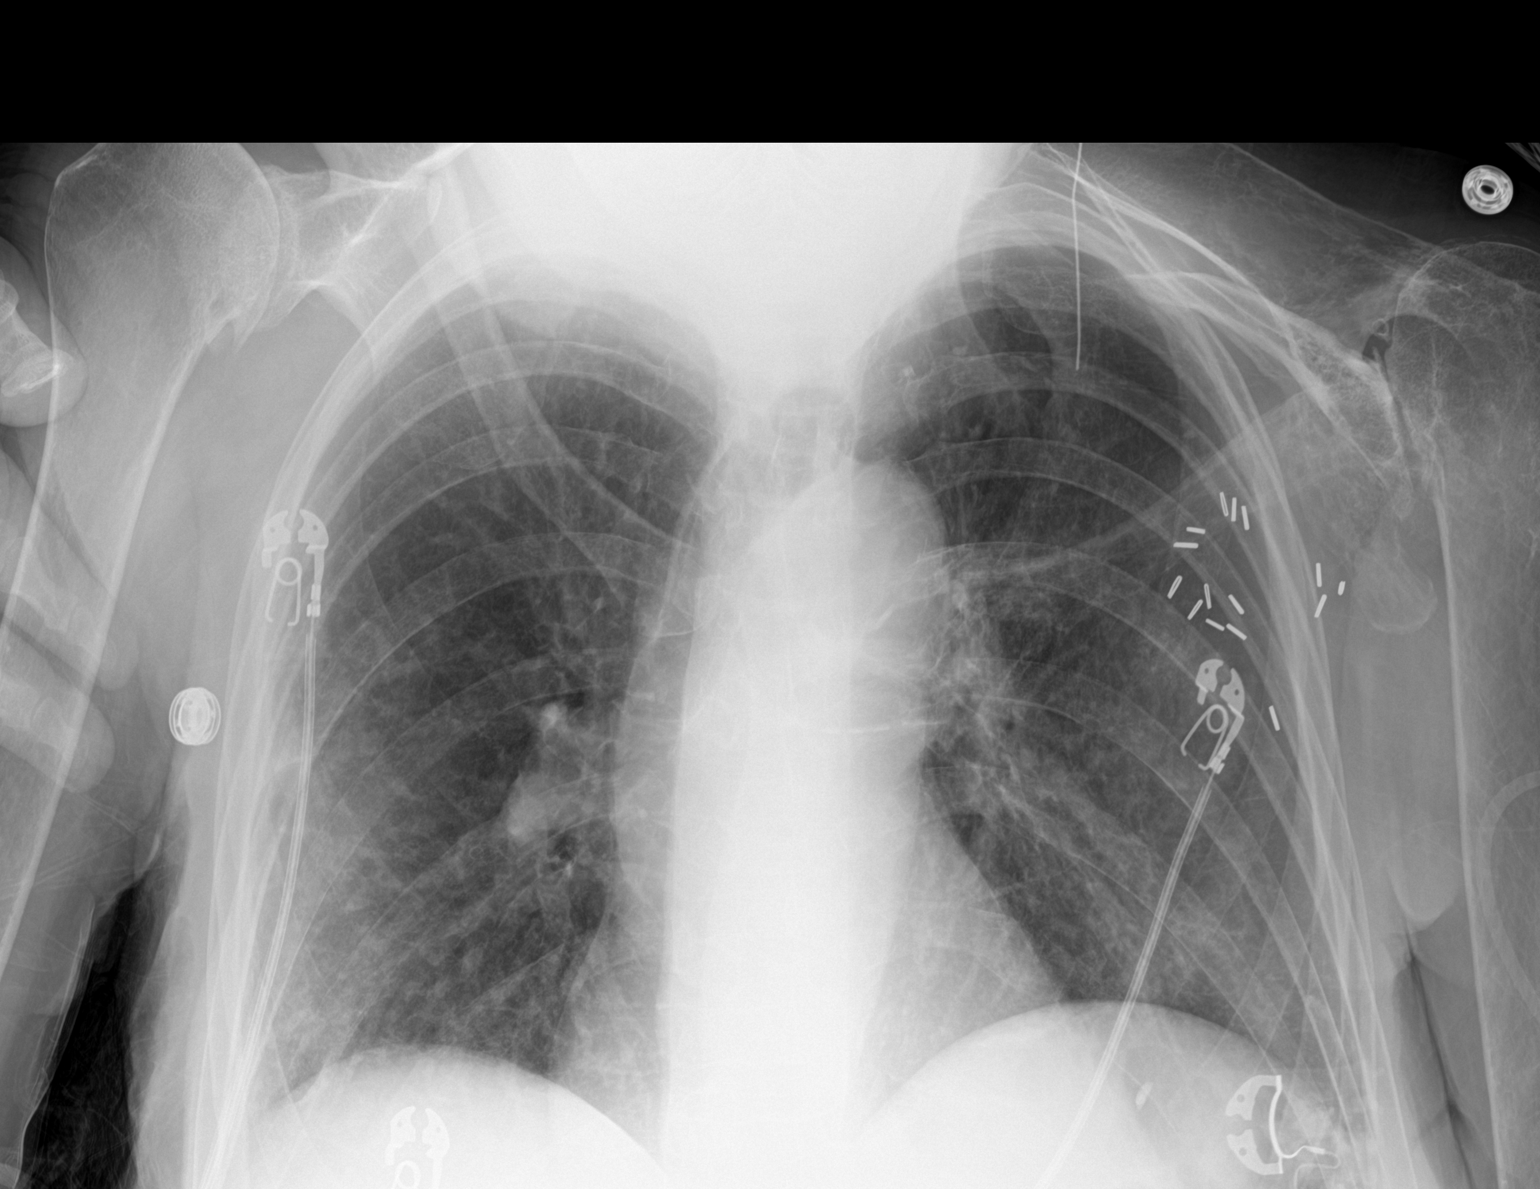

[1 of 1 positions shown; findings below may reference images not displayed]

FINDINGS: Kyphotic positioning with chin over the right more than left apex.
No visible pneumothorax. When allowing for interstitial crowding
there is no convincing airspace disease.

Postoperative left axilla. Normal heart size and mediastinal
contours.

Severe glenohumeral osteoarthritis on the left. There is asymmetric
left-sided shoulder girdle osteopenia. Suspect findings are sequela
of remote radiotherapy.
IMPRESSION: No acute finding including evidence of pneumothorax.

## 2020-05-31 IMAGING — DX DG CHEST 1V PORT
1 series · 1 of 1 positions shown · non-contrast
Comparison: Single-view of the chest 11/20/2019 and 11/17/2019.

CLINICAL DATA: Respiratory failure.

EXAM:
PORTABLE CHEST 1 VIEW

[chest ap]
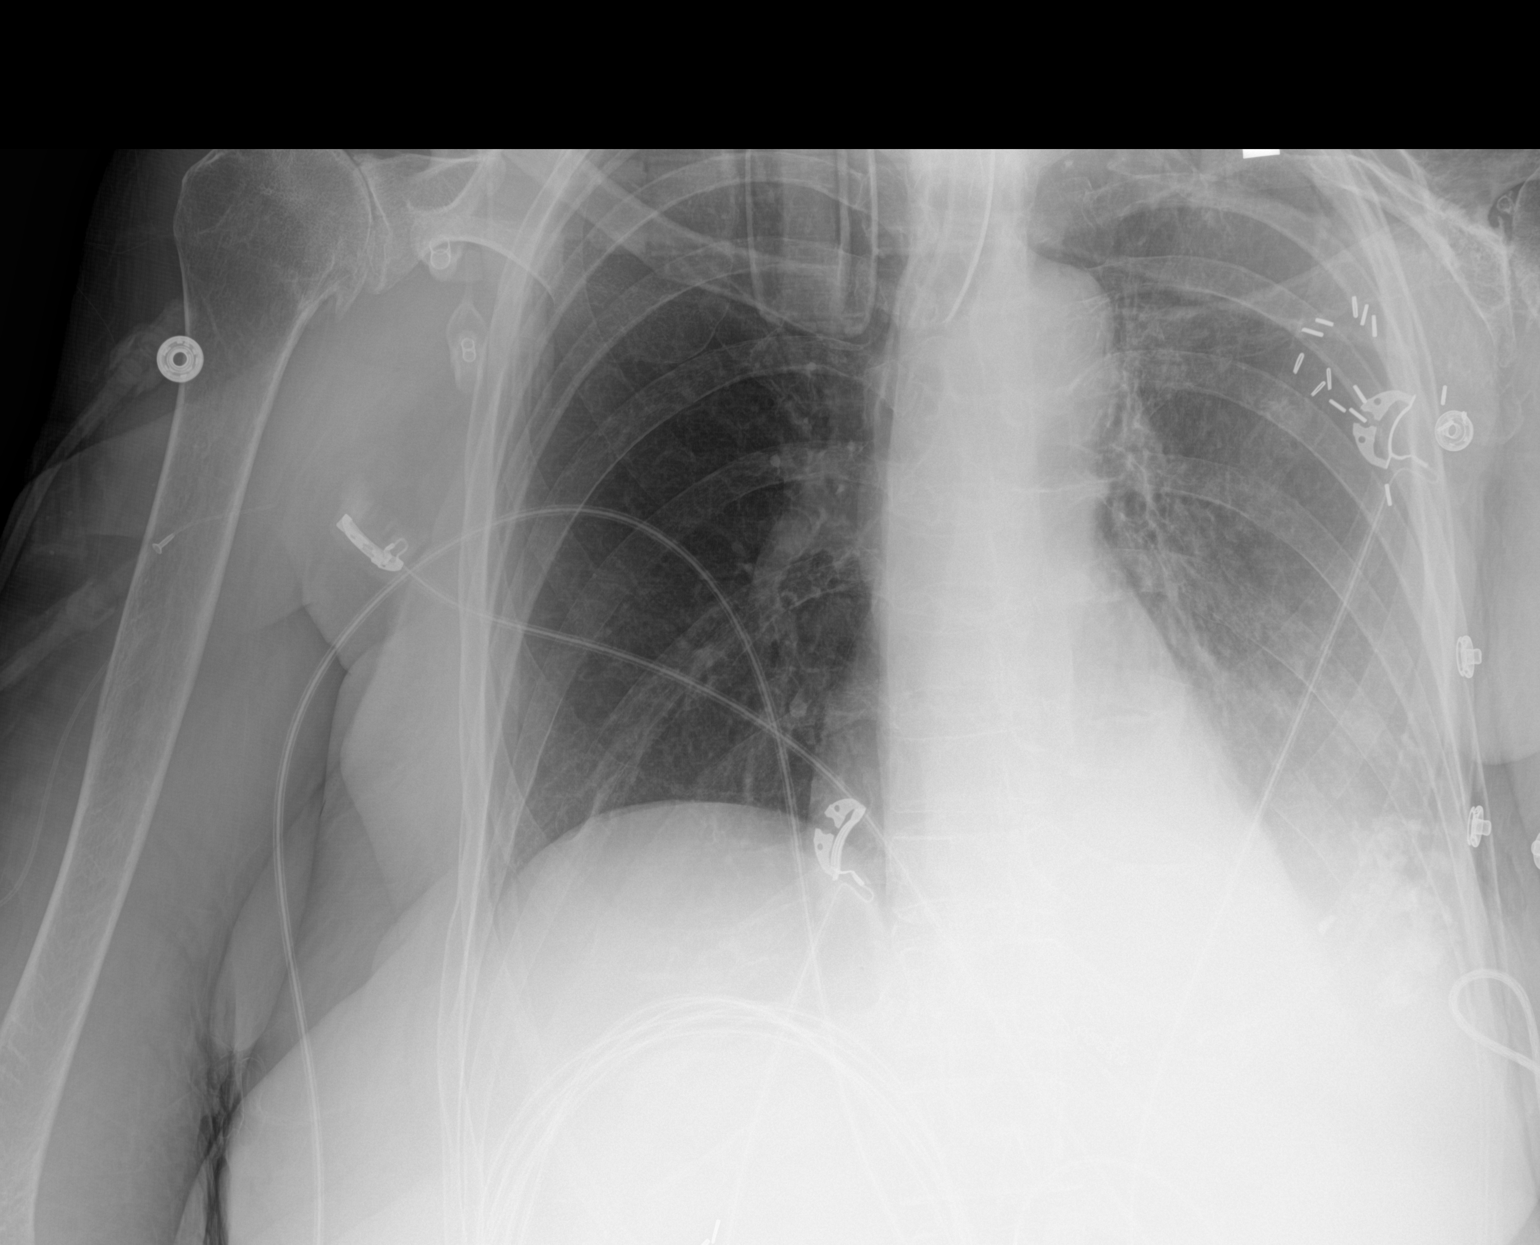

[1 of 1 positions shown; findings below may reference images not displayed]

FINDINGS: Endotracheal tube remains in place. Left pleural effusion and
airspace disease appear worse than on the most recent comparison.
The right lung is clear. No pneumothorax. Heart size is normal.
IMPRESSION: Increased left pleural effusion and basilar airspace disease which
could be atelectasis or pneumonia.
